# Patient Record
Sex: Female | Born: 1935 | Race: White | Hispanic: No | Marital: Married | Smoking: Former smoker
Health system: Southern US, Community
[De-identification: ages and names within clinical notes are randomized; demographics above are authoritative.]

## PROBLEM LIST (undated history)

## (undated) DIAGNOSIS — E079 Disorder of thyroid, unspecified: Secondary | ICD-10-CM

## (undated) DIAGNOSIS — E039 Hypothyroidism, unspecified: Secondary | ICD-10-CM

## (undated) DIAGNOSIS — R42 Dizziness and giddiness: Secondary | ICD-10-CM

## (undated) DIAGNOSIS — E785 Hyperlipidemia, unspecified: Secondary | ICD-10-CM

## (undated) DIAGNOSIS — I340 Nonrheumatic mitral (valve) insufficiency: Secondary | ICD-10-CM

## (undated) DIAGNOSIS — I10 Essential (primary) hypertension: Secondary | ICD-10-CM

## (undated) DIAGNOSIS — K219 Gastro-esophageal reflux disease without esophagitis: Secondary | ICD-10-CM

## (undated) DIAGNOSIS — I499 Cardiac arrhythmia, unspecified: Secondary | ICD-10-CM

## (undated) DIAGNOSIS — I4891 Unspecified atrial fibrillation: Secondary | ICD-10-CM

## (undated) DIAGNOSIS — M1990 Unspecified osteoarthritis, unspecified site: Secondary | ICD-10-CM

## (undated) HISTORY — PX: APPENDECTOMY: SHX54

## (undated) HISTORY — PX: CHOLECYSTECTOMY: SHX55

## (undated) HISTORY — PX: VAGINAL HYSTERECTOMY: SUR661

## (undated) HISTORY — PX: BREAST LUMPECTOMY: SHX2

## (undated) HISTORY — PX: BREAST BIOPSY: SHX20

## (undated) HISTORY — PX: COLONOSCOPY: SHX174

## (undated) HISTORY — PX: BREAST EXCISIONAL BIOPSY: SUR124

## (undated) HISTORY — DX: Dizziness and giddiness: R42

---

## 2000-09-03 ENCOUNTER — Encounter: Payer: Self-pay | Admitting: Family Medicine

## 2000-09-03 ENCOUNTER — Ambulatory Visit (HOSPITAL_COMMUNITY): Admission: RE | Admit: 2000-09-03 | Discharge: 2000-09-03 | Payer: Self-pay | Admitting: Family Medicine

## 2001-01-18 ENCOUNTER — Ambulatory Visit (HOSPITAL_COMMUNITY): Admission: RE | Admit: 2001-01-18 | Discharge: 2001-01-18 | Payer: Self-pay | Admitting: Internal Medicine

## 2001-09-17 ENCOUNTER — Ambulatory Visit (HOSPITAL_COMMUNITY): Admission: RE | Admit: 2001-09-17 | Discharge: 2001-09-17 | Payer: Self-pay | Admitting: Family Medicine

## 2001-09-17 ENCOUNTER — Encounter: Payer: Self-pay | Admitting: Family Medicine

## 2001-12-03 ENCOUNTER — Encounter: Payer: Self-pay | Admitting: Family Medicine

## 2001-12-03 ENCOUNTER — Ambulatory Visit (HOSPITAL_COMMUNITY): Admission: RE | Admit: 2001-12-03 | Discharge: 2001-12-03 | Payer: Self-pay | Admitting: Family Medicine

## 2002-09-30 ENCOUNTER — Encounter: Payer: Self-pay | Admitting: Family Medicine

## 2002-09-30 ENCOUNTER — Ambulatory Visit (HOSPITAL_COMMUNITY): Admission: RE | Admit: 2002-09-30 | Discharge: 2002-09-30 | Payer: Self-pay | Admitting: Family Medicine

## 2002-12-11 ENCOUNTER — Ambulatory Visit (HOSPITAL_COMMUNITY): Admission: RE | Admit: 2002-12-11 | Discharge: 2002-12-11 | Payer: Self-pay | Admitting: Family Medicine

## 2002-12-19 ENCOUNTER — Ambulatory Visit (HOSPITAL_COMMUNITY): Admission: RE | Admit: 2002-12-19 | Discharge: 2002-12-19 | Payer: Self-pay | Admitting: Family Medicine

## 2003-01-13 ENCOUNTER — Ambulatory Visit (HOSPITAL_COMMUNITY): Admission: RE | Admit: 2003-01-13 | Discharge: 2003-01-13 | Payer: Self-pay | Admitting: Internal Medicine

## 2003-10-13 ENCOUNTER — Ambulatory Visit (HOSPITAL_COMMUNITY): Admission: RE | Admit: 2003-10-13 | Discharge: 2003-10-13 | Payer: Self-pay | Admitting: Family Medicine

## 2003-10-20 ENCOUNTER — Encounter: Admission: RE | Admit: 2003-10-20 | Discharge: 2003-10-20 | Payer: Self-pay | Admitting: Family Medicine

## 2004-04-27 ENCOUNTER — Ambulatory Visit (HOSPITAL_COMMUNITY): Admission: RE | Admit: 2004-04-27 | Discharge: 2004-04-27 | Payer: Self-pay | Admitting: Family Medicine

## 2004-04-29 ENCOUNTER — Encounter: Admission: RE | Admit: 2004-04-29 | Discharge: 2004-04-29 | Payer: Self-pay | Admitting: Family Medicine

## 2004-04-29 ENCOUNTER — Encounter (INDEPENDENT_AMBULATORY_CARE_PROVIDER_SITE_OTHER): Payer: Self-pay | Admitting: *Deleted

## 2004-10-17 ENCOUNTER — Ambulatory Visit (HOSPITAL_COMMUNITY): Admission: RE | Admit: 2004-10-17 | Discharge: 2004-10-17 | Payer: Self-pay | Admitting: Family Medicine

## 2005-10-23 ENCOUNTER — Ambulatory Visit (HOSPITAL_COMMUNITY): Admission: RE | Admit: 2005-10-23 | Discharge: 2005-10-23 | Payer: Self-pay | Admitting: Family Medicine

## 2006-01-08 ENCOUNTER — Ambulatory Visit (HOSPITAL_COMMUNITY): Admission: RE | Admit: 2006-01-08 | Discharge: 2006-01-08 | Payer: Self-pay | Admitting: Family Medicine

## 2006-01-29 ENCOUNTER — Ambulatory Visit (HOSPITAL_COMMUNITY): Admission: RE | Admit: 2006-01-29 | Discharge: 2006-01-29 | Payer: Self-pay | Admitting: Endocrinology

## 2006-10-10 ENCOUNTER — Ambulatory Visit (HOSPITAL_COMMUNITY): Admission: RE | Admit: 2006-10-10 | Discharge: 2006-10-10 | Payer: Self-pay | Admitting: Family Medicine

## 2006-10-29 ENCOUNTER — Ambulatory Visit (HOSPITAL_COMMUNITY): Admission: RE | Admit: 2006-10-29 | Discharge: 2006-10-29 | Payer: Self-pay | Admitting: Family Medicine

## 2006-12-12 ENCOUNTER — Ambulatory Visit (HOSPITAL_COMMUNITY): Admission: RE | Admit: 2006-12-12 | Discharge: 2006-12-12 | Payer: Self-pay | Admitting: Endocrinology

## 2007-07-10 ENCOUNTER — Ambulatory Visit (HOSPITAL_COMMUNITY): Admission: RE | Admit: 2007-07-10 | Discharge: 2007-07-10 | Payer: Self-pay | Admitting: Endocrinology

## 2007-11-04 ENCOUNTER — Ambulatory Visit (HOSPITAL_COMMUNITY): Admission: RE | Admit: 2007-11-04 | Discharge: 2007-11-04 | Payer: Self-pay | Admitting: Family Medicine

## 2008-05-20 ENCOUNTER — Ambulatory Visit (HOSPITAL_COMMUNITY): Admission: RE | Admit: 2008-05-20 | Discharge: 2008-05-20 | Payer: Self-pay | Admitting: Endocrinology

## 2008-11-09 ENCOUNTER — Ambulatory Visit (HOSPITAL_COMMUNITY): Admission: RE | Admit: 2008-11-09 | Discharge: 2008-11-09 | Payer: Self-pay | Admitting: Family Medicine

## 2009-05-26 ENCOUNTER — Encounter: Admission: RE | Admit: 2009-05-26 | Discharge: 2009-05-26 | Payer: Self-pay | Admitting: Endocrinology

## 2009-11-15 ENCOUNTER — Ambulatory Visit (HOSPITAL_COMMUNITY): Admission: RE | Admit: 2009-11-15 | Discharge: 2009-11-15 | Payer: Self-pay | Admitting: Family Medicine

## 2009-12-07 ENCOUNTER — Ambulatory Visit (HOSPITAL_COMMUNITY): Admission: RE | Admit: 2009-12-07 | Discharge: 2009-12-07 | Payer: Self-pay | Admitting: Endocrinology

## 2010-10-17 ENCOUNTER — Other Ambulatory Visit (HOSPITAL_COMMUNITY): Payer: Self-pay | Admitting: Family Medicine

## 2010-10-17 DIAGNOSIS — Z139 Encounter for screening, unspecified: Secondary | ICD-10-CM

## 2010-10-21 ENCOUNTER — Encounter: Payer: Self-pay | Admitting: *Deleted

## 2010-10-21 ENCOUNTER — Emergency Department (HOSPITAL_COMMUNITY)
Admission: EM | Admit: 2010-10-21 | Discharge: 2010-10-22 | Disposition: A | Discharge status: Home / Self Care | Payer: PRIVATE HEALTH INSURANCE | Attending: Emergency Medicine | Admitting: Emergency Medicine

## 2010-10-21 DIAGNOSIS — M25519 Pain in unspecified shoulder: Secondary | ICD-10-CM | POA: Insufficient documentation

## 2010-10-21 DIAGNOSIS — E079 Disorder of thyroid, unspecified: Secondary | ICD-10-CM | POA: Insufficient documentation

## 2010-10-21 DIAGNOSIS — G43909 Migraine, unspecified, not intractable, without status migrainosus: Secondary | ICD-10-CM | POA: Insufficient documentation

## 2010-10-21 DIAGNOSIS — R1013 Epigastric pain: Secondary | ICD-10-CM | POA: Insufficient documentation

## 2010-10-21 DIAGNOSIS — I10 Essential (primary) hypertension: Secondary | ICD-10-CM | POA: Insufficient documentation

## 2010-10-21 DIAGNOSIS — R112 Nausea with vomiting, unspecified: Secondary | ICD-10-CM | POA: Insufficient documentation

## 2010-10-21 HISTORY — DX: Essential (primary) hypertension: I10

## 2010-10-21 HISTORY — DX: Disorder of thyroid, unspecified: E07.9

## 2010-10-21 NOTE — ED Notes (Addendum)
Pt c/o mid back pain, epigastric pain and vomiting. Pt states after she vomited she felt like she pulled a muscle in her back and is now sob at times.

## 2010-10-22 ENCOUNTER — Other Ambulatory Visit: Payer: Self-pay

## 2010-10-22 LAB — CBC
HCT: 41 % (ref 36.0–46.0)
Hemoglobin: 13.7 g/dL (ref 12.0–15.0)
MCH: 28.5 pg (ref 26.0–34.0)
MCHC: 33.4 g/dL (ref 30.0–36.0)
MCV: 85.4 fL (ref 78.0–100.0)
Platelets: 283 10*3/uL (ref 150–400)
RBC: 4.8 MIL/uL (ref 3.87–5.11)
RDW: 14.1 % (ref 11.5–15.5)
WBC: 9.4 10*3/uL (ref 4.0–10.5)

## 2010-10-22 LAB — COMPREHENSIVE METABOLIC PANEL
Albumin: 3.6 g/dL (ref 3.5–5.2)
BUN: 18 mg/dL (ref 6–23)
Calcium: 8.8 mg/dL (ref 8.4–10.5)
Creatinine, Ser: 0.63 mg/dL (ref 0.50–1.10)
Potassium: 3.5 mEq/L (ref 3.5–5.1)
Sodium: 136 mEq/L (ref 135–145)
Total Protein: 6.5 g/dL (ref 6.0–8.3)

## 2010-10-22 LAB — LIPASE, BLOOD: Lipase: 18 U/L (ref 11–59)

## 2010-10-22 LAB — TROPONIN I: Troponin I: <0.3 ng/mL (ref <0.30)

## 2010-10-22 MED ORDER — ONDANSETRON HCL 4 MG PO TABS: 8.0000 mg | ORAL_TABLET | Freq: Three times a day (TID) | ORAL | Status: AC | PRN

## 2010-10-22 MED ORDER — SODIUM CHLORIDE 0.9 % IV BOLUS (SEPSIS)
1000.0000 mL | Freq: Once | INTRAVENOUS | Status: AC
  Administered 2010-10-22: 1000 mL via INTRAVENOUS

## 2010-10-22 MED ORDER — GI COCKTAIL ~~LOC~~
30.0000 mL | Freq: Once | ORAL | Status: AC
  Administered 2010-10-22: 30 mL via ORAL
  Filled 2010-10-22: qty 30

## 2010-10-22 MED ORDER — ONDANSETRON HCL 4 MG/2ML IJ SOLN
4.0000 mg | Freq: Once | INTRAMUSCULAR | Status: AC
  Administered 2010-10-22: 4 mg via INTRAVENOUS
  Filled 2010-10-22: qty 2

## 2010-10-22 NOTE — ED Provider Notes (Signed)
History     CSN: [id number] Arrival date & time: 10/21/2010 11:56 PM  Chief Complaint  Patient presents with  . Abdominal Pain  . Emesis  . Back Pain  . Shortness of Breath    (Consider location/radiation/quality/duration/timing/severity/associated sxs/prior treatment) The history is provided by the patient.   patient reports developing nausea and several episodes of nonbloody nonbilious vomiting today and this evening.  She reports this evening before going to bed she did begin to have some pain in her left scapular region as well as in the epigastric discomfort.  She felt as though that she was unable to get a deep breath.  At that time she was still nauseated now she presents to the emergency department for evaluation.  He is a healthy female without significant past medical history.  She denies history of congestive heart failure.  She denies recent sick contacts.  She's had no diaphoresis.  She's had no cough congestion fever or chills.  She's had no melena or hematochezia.  Nothing worsens her symptoms.  Her symptoms are not improved by anything either.  Discomfort in her left scapula is described as a ache.   Past Medical History  Diagnosis Date  . Hypertension   . Thyroid disease   . Migraine     Past Surgical History  Procedure Date  . Vaginal hysterectomy   . Cholecystectomy     History reviewed. No pertinent family history.  History  Substance Use Topics  . Smoking status: Never Smoker   . Smokeless tobacco: Not on file  . Alcohol Use: No    OB History    Grav Para Term Preterm Abortions TAB SAB Ect Mult Living                  Review of Systems  All other systems reviewed and are negative.    Allergies  Review of patient's allergies indicates no known allergies.  Home Medications   Current Outpatient Rx  Name Route Sig Dispense Refill  . BENAZEPRIL-HYDROCHLOROTHIAZIDE 20-12.5 MG PO TABS Oral Take 1 tablet by mouth daily.      Marland Kitchen CALCIUM CARBONATE  600 MG PO TABS Oral Take 600 mg by mouth 2 (two) times daily with a meal.      . EZETIMIBE-SIMVASTATIN 10-20 MG PO TABS Oral Take 0.5 tablets by mouth at bedtime.      Marland Kitchen FEXOFENADINE HCL 180 MG PO TABS Oral Take 180 mg by mouth daily.      . IMIPRAMINE HCL 25 MG PO TABS Oral Take 25 mg by mouth at bedtime.      Marland Kitchen LEVOTHYROXINE SODIUM 100 MCG PO TABS Oral Take 100 mcg by mouth daily.        BP 139/99  Pulse 72  Temp(Src) 98.2 F (36.8 C) (Oral)  Resp 20  Wt 155 lb (70.308 kg)  SpO2 99%  Physical Exam  Nursing note and vitals reviewed. Constitutional: She is oriented to person, place, and time. She appears well-developed and well-nourished. No distress.  HENT:  Head: Normocephalic and atraumatic.  Eyes: EOM are normal.  Neck: Normal range of motion.  Cardiovascular: Normal rate, regular rhythm and normal heart sounds.   Pulmonary/Chest: Effort normal and breath sounds normal.  Abdominal: Soft. She exhibits no distension. There is no tenderness.  Musculoskeletal: Normal range of motion. She exhibits no tenderness.       Mild left scapular pain.  No evidence of rash  Neurological: She is alert and oriented to person, place,  and time.  Skin: Skin is warm and dry.  Psychiatric: She has a normal mood and affect. Judgment normal.    ED Course  Procedures (including critical care time)  Labs Reviewed  COMPREHENSIVE METABOLIC PANEL - Abnormal; Notable for the following:    Glucose, Bld 102 (*)    GFR calc non Af Amer 85 (*)    All other components within normal limits  CBC  LIPASE, BLOOD  TROPONIN I   No results found.   1. Nausea and vomiting      Date: 10/22/2010  Rate: 69  Rhythm: normal sinus rhythm  QRS Axis: normal  Intervals: normal  ST/T Wave abnormalities: normal  Conduction Disutrbances:none  Narrative Interpretation:   Old EKG Reviewed: none available    MDM  Patient symptoms seem GI related in nature.  Her nausea was controlled.  She was given IV  fluids.  She was given a GI cocktail with resolution of her symptoms.  Her EKG is normal. My suspicion that this is an atypical presentation for ACS is very low.  Patient will be sent home with a prescription for antiemetics as well as Prilosec.  She's been instructed to followup with her primary care Dr. on Monday if she continues to not feel well.  She's been instructed to return to the ER sooner for any new or worsening symptoms        Lyanne Co, MD 10/22/10 [phone]

## 2010-10-22 NOTE — ED Notes (Signed)
GI Cocktail administered, pt advised not to eat or drink for a minimum of 30 minutes.

## 2010-11-21 ENCOUNTER — Ambulatory Visit (HOSPITAL_COMMUNITY)
Admission: RE | Admit: 2010-11-21 | Discharge: 2010-11-21 | Disposition: A | Discharge status: Home / Self Care | Payer: Medicare Other | Source: Ambulatory Visit | Attending: Family Medicine | Admitting: Family Medicine

## 2010-11-21 DIAGNOSIS — Z1231 Encounter for screening mammogram for malignant neoplasm of breast: Secondary | ICD-10-CM | POA: Insufficient documentation

## 2010-11-21 DIAGNOSIS — Z139 Encounter for screening, unspecified: Secondary | ICD-10-CM

## 2010-12-02 ENCOUNTER — Other Ambulatory Visit: Payer: Self-pay | Admitting: Family Medicine

## 2010-12-02 DIAGNOSIS — R928 Other abnormal and inconclusive findings on diagnostic imaging of breast: Secondary | ICD-10-CM

## 2010-12-07 ENCOUNTER — Other Ambulatory Visit (HOSPITAL_COMMUNITY): Payer: Self-pay | Admitting: Family Medicine

## 2010-12-07 ENCOUNTER — Ambulatory Visit (HOSPITAL_COMMUNITY)
Admission: RE | Admit: 2010-12-07 | Discharge: 2010-12-07 | Disposition: A | Discharge status: Home / Self Care | Payer: Medicare Other | Source: Ambulatory Visit | Attending: Family Medicine | Admitting: Family Medicine

## 2010-12-07 ENCOUNTER — Other Ambulatory Visit: Payer: Self-pay | Admitting: Diagnostic Radiology

## 2010-12-07 ENCOUNTER — Inpatient Hospital Stay (HOSPITAL_COMMUNITY): Admission: RE | Admit: 2010-12-07 | Discharge: 2010-12-07 | Payer: Medicare Other | Source: Ambulatory Visit

## 2010-12-07 DIAGNOSIS — R928 Other abnormal and inconclusive findings on diagnostic imaging of breast: Secondary | ICD-10-CM

## 2010-12-07 DIAGNOSIS — N63 Unspecified lump in unspecified breast: Secondary | ICD-10-CM

## 2010-12-07 DIAGNOSIS — N6089 Other benign mammary dysplasias of unspecified breast: Secondary | ICD-10-CM | POA: Insufficient documentation

## 2010-12-07 NOTE — Progress Notes (Signed)
Procedure started at 1035.  Left breast biopsy performed.  8 cc of Lidocaine injected into left breast.  4 passes with 4 tissue biopsies removed with clip placement.  Procedure end time 1043.

## 2010-12-26 ENCOUNTER — Encounter (INDEPENDENT_AMBULATORY_CARE_PROVIDER_SITE_OTHER): Payer: Self-pay | Admitting: General Surgery

## 2010-12-26 ENCOUNTER — Other Ambulatory Visit (INDEPENDENT_AMBULATORY_CARE_PROVIDER_SITE_OTHER): Payer: Self-pay | Admitting: General Surgery

## 2010-12-26 ENCOUNTER — Ambulatory Visit (INDEPENDENT_AMBULATORY_CARE_PROVIDER_SITE_OTHER): Payer: Medicare Other | Admitting: General Surgery

## 2010-12-26 VITALS — BP 130/64 | HR 82 | Temp 97.8°F | Resp 18 | Ht 67.0 in | Wt 158.4 lb

## 2010-12-26 DIAGNOSIS — N6099 Unspecified benign mammary dysplasia of unspecified breast: Secondary | ICD-10-CM

## 2010-12-26 DIAGNOSIS — N6089 Other benign mammary dysplasias of unspecified breast: Secondary | ICD-10-CM

## 2010-12-26 NOTE — Progress Notes (Signed)
Patient ID: Erin Schmidt, female   DOB: 09-11-1932, 77 y.o.   MRN: [id number]  Chief Complaint  Patient presents with  . Other    new pt- eval lt breast atypical hyperplasia    HPI Erin Schmidt is a 77 y.o. female. She is referred by Dr. Cain Saupe for evaluation of atypical ductal hyperplasia left breast, 3:00 position  The patient has not had any serious breast problems in the past. She had a left breast biopsy 15 years ago, and a right breast biopsy 30 years ago, and was told both of those were completely benign.  Recent screening mammogram show a 7 mm mass at the 3:00 position of the left breast laterally, 6 cm the nipple. Image guided biopsy revealed a papillary lesion with atypical ductal hyperplasia. Dr. Jimmy Picket said that this border no ductal carcinoma in situ.  She has done well since the biopsy and is here to discuss excision of this area.  Family history is negative.  She denies feeling a mass. She denies nipple discharge. She is asymptomatic. HPI  Past Medical History  Diagnosis Date  . Thyroid disease   . Migraine   . Hypertension     preventative measures    Past Surgical History  Procedure Date  . Cholecystectomy   . Breast lumpectomy 30 yrs ago    Dr. Shon Hale- rt breast  . Breast lumpectomy 15 yrs ago    left- Dr. Malvin Johns  . Vaginal hysterectomy     2 partials    Family History  Problem Relation Age of Onset  . Heart disease Mother     Social History History  Substance Use Topics  . Smoking status: Former Games developer  . Smokeless tobacco: Not on file  . Alcohol Use: No    No Known Allergies  Current Outpatient Prescriptions  Medication Sig Dispense Refill  . benazepril-hydrochlorthiazide (LOTENSIN HCT) 20-12.5 MG per tablet Take 1 tablet by mouth daily.        . Calcium Carbonate-Vitamin D (CALCIUM + D PO) Take 600 mg by mouth 2 (two) times daily.        Marland Kitchen ezetimibe-simvastatin (VYTORIN) 10-20 MG per tablet Take 0.5 tablets by  mouth at bedtime.        . fexofenadine (ALLEGRA) 180 MG tablet Take 180 mg by mouth daily.        Marland Kitchen imipramine (TOFRANIL) 25 MG tablet Take 50 mg by mouth at bedtime.       Marland Kitchen levothyroxine (SYNTHROID, LEVOTHROID) 100 MCG tablet Take 100 mcg by mouth daily.        Marland Kitchen omeprazole (PRILOSEC) 20 MG capsule Take 20 mg by mouth daily.          Review of Systems Review of Systems  Constitutional: Negative for fever, chills and unexpected weight change.  HENT: Negative for hearing loss, congestion, sore throat, trouble swallowing and voice change.   Eyes: Negative for visual disturbance.  Respiratory: Negative for cough and wheezing.   Cardiovascular: Negative for chest pain, palpitations and leg swelling.  Gastrointestinal: Negative for nausea, vomiting, abdominal pain, diarrhea, constipation, blood in stool, abdominal distention and anal bleeding.  Genitourinary: Negative for hematuria, vaginal bleeding and difficulty urinating.  Musculoskeletal: Negative for arthralgias.  Skin: Negative for rash and wound.  Neurological: Negative for seizures, syncope and headaches.  Hematological: Negative for adenopathy. Does not bruise/bleed easily.  Psychiatric/Behavioral: Negative for confusion.    Blood pressure 130/64, pulse 82, temperature 97.8 F (36.6 C), temperature source Temporal,  resp. rate 18, height 5\' 7"  (1.702 m), weight 158 lb 6.4 oz (71.85 kg).  Physical Exam Physical Exam  Constitutional: She is oriented to person, place, and time. She appears well-developed and well-nourished. No distress.  HENT:  Head: Normocephalic and atraumatic.  Nose: Nose normal.  Mouth/Throat: No oropharyngeal exudate.  Eyes: Conjunctivae and EOM are normal. Pupils are equal, round, and reactive to light. Left eye exhibits no discharge. No scleral icterus.  Neck: Neck supple. No JVD present. No tracheal deviation present. No thyromegaly present.  Cardiovascular: Normal rate, regular rhythm, normal heart  sounds and intact distal pulses.   No murmur heard. Pulmonary/Chest: Effort normal and breath sounds normal. No respiratory distress. She has no wheezes. She has no rales. She exhibits no tenderness.    Abdominal: Soft. Bowel sounds are normal. She exhibits no distension and no mass. There is no tenderness. There is no rebound and no guarding.    Musculoskeletal: She exhibits no edema and no tenderness.  Lymphadenopathy:    She has no cervical adenopathy.  Neurological: She is alert and oriented to person, place, and time. She exhibits normal muscle tone. Coordination normal.  Skin: Skin is warm. No rash noted. She is not diaphoretic. No erythema. No pallor.  Psychiatric: She has a normal mood and affect. Her behavior is normal. Judgment and thought content normal.    Data Reviewed  I reviewed her mammograms, her ultrasound, and her pathology report. I have given her a copy of her pathology report. Assessment    Left breast mass, 3:00 position. By image guided biopsy this is at least atypical ductal hyperplasia, and this borders on ductal carcinoma in situ.  Hypertension, controlled  Hypothyroidism.  History migraine headaches.  Hyperlipidemia    Plan    The patient will be scheduled for left partial mastectomy with needle localization in the near future.  I discussed the indications and details of the surgery with her and her husband. Risks and complications have been outlined, including but not limited to bleeding, infection, cosmetic deformity, skin necrosis, nerve damage, reoperation for positive margins, reoperations for lymph node biopsy if this turns out to be invasive cancer, cardiac, pulmonary, and thromboembolic problems. She seems to understand these issues well. Her questions were answered. She agrees with this plan.       Polo Mcmartin M 12/26/2010, 10:34 AM

## 2010-12-26 NOTE — Patient Instructions (Signed)
In the lateral aspect of the left breast there is a small spot. The biopsy shows atypical ductal hyperplasia. It is possible that this may be an early form of cancer called ductal carcinoma in situ. You will be scheduled for a left breast biopsy with needle localization in the near future.  Lumpectomy, Breast Conserving Surgery A lumpectomy is breast surgery that removes only part of the breast. Another name used may be partial mastectomy. The amount removed varies. Make sure you understand how much of your breast will be removed. Reasons for a lumpectomy:  Any solid breast mass.   Grouped significant nodularity that may be confused with a solitary breast mass.  Lumpectomy is the most common form of breast cancer surgery today. The surgeon removes the portion of your breast which contains the tumor (cancer). This is the lump. Some normal tissue around the lump is also removed to be sure that all the tumor has been removed.  If cancer cells are found in the margins where the breast tissue was removed, your surgeon will do more surgery to remove the remaining cancer tissue. This is called re-excision surgery. Radiation and/or chemotherapy treatments are often given following a lumpectomy to kill any cancer cells that could possibly remain.  REASONS YOU MAY NOT BE ABLE TO HAVE BREAST CONSERVING SURGERY:  The tumor is located in more than one place.   Your breast is small and the tumor is large so the breast would be disfigured.   The entire tumor removal is not successful with a lumpectomy.   You cannot commit to a full course of chemotherapy, radiation therapy or are pregnant and cannot have radiation.   You have previously had radiation to the breast to treat cancer.  HOW A LUMPECTOMY IS PERFORMED If overnight nursing is not required following a biopsy, a lumpectomy can be performed as a same-day surgery. This can be done in a hospital, clinic, or surgical center. The anesthesia used will  depend on your surgeon. They will discuss this with you. A general anesthetic keeps you sleeping through the procedure. LET YOUR CAREGIVERS KNOW ABOUT THE FOLLOWING:  Allergies   Medications taken including herbs, eye drops, over the counter medications, and creams.   Use of steroids (by mouth or creams)   Previous problems with anesthetics or Novocaine.   Possibility of pregnancy, if this applies   History of blood clots (thrombophlebitis)   History of bleeding or blood problems.   Previous surgery   Other health problems  BEFORE THE PROCEDURE You should be present one hour prior to your procedure unless directed otherwise.  AFTER THE PROCEDURE  After surgery, you will be taken to the recovery area where a nurse will watch and check your progress. Once you're awake, stable, and taking fluids well, barring other problems you will be allowed to go home.   Ice packs applied to your operative site may help with discomfort and keep the swelling down.   A small rubber drain may be placed in the breast for a couple of days to prevent a hematoma from developing in the breast.   A pressure dressing may be applied for 24 to 48 hours to prevent bleeding.   Keep the wound dry.   You may resume a normal diet and activities as directed. Avoid strenuous activities affecting the arm on the side of the biopsy site such as tennis, swimming, heavy lifting (more than 10 pounds) or pulling.   Bruising in the breast is normal following  this procedure.   Wearing a bra - even to bed - may be more comfortable and also help keep the dressing on.   Change dressings as directed.   Only take over-the-counter or prescription medicines for pain, discomfort, or fever as directed by your caregiver.  Call for your results as instructed by your surgeon. Remember it is your responsibility to get the results of your lumpectomy if your surgeon asked you to follow-up. Do not assume everything is fine if you  have not heard from your caregiver. SEEK MEDICAL CARE IF:   There is increased bleeding (more than a small spot) from the wound.   You notice redness, swelling, or increasing pain in the wound.   Pus is coming from wound.   An unexplained oral temperature above 102 F (38.9 C) develops.   You notice a foul smell coming from the wound or dressing.  SEEK IMMEDIATE MEDICAL CARE IF:   You develop a rash.   You have difficulty breathing.   You have any allergic problems.  Document Released: 02/06/2006 Document Revised: 09/07/2010 Document Reviewed: 05/10/2006 [facility] Patient Information 2012 [place], [place].

## 2011-01-06 ENCOUNTER — Encounter (HOSPITAL_BASED_OUTPATIENT_CLINIC_OR_DEPARTMENT_OTHER): Payer: Self-pay | Admitting: *Deleted

## 2011-01-06 NOTE — Progress Notes (Signed)
To come in for lab

## 2011-01-10 HISTORY — PX: BREAST LUMPECTOMY: SHX2

## 2011-01-11 ENCOUNTER — Telehealth (INDEPENDENT_AMBULATORY_CARE_PROVIDER_SITE_OTHER): Payer: Self-pay

## 2011-01-11 NOTE — Telephone Encounter (Signed)
Pt has pulled muscle in her back and wants to know if she can take a muscle relaxer. I advised pt that she needs to discuss any meds for this with her PCP and if he prescribes something it should not be an anti inflammatory or aspirin in it due to blood thinner prior to surgery on Friday. Pt will call her MD re: this and also let presurg testing know what med she is place on.

## 2011-01-12 ENCOUNTER — Encounter (HOSPITAL_BASED_OUTPATIENT_CLINIC_OR_DEPARTMENT_OTHER)
Admission: RE | Admit: 2011-01-12 | Discharge: 2011-01-12 | Disposition: A | Discharge status: Home / Self Care | Payer: Medicare Other | Source: Ambulatory Visit | Attending: General Surgery | Admitting: General Surgery

## 2011-01-12 LAB — BASIC METABOLIC PANEL
CO2: 30 mEq/L (ref 19–32)
Calcium: 9.4 mg/dL (ref 8.4–10.5)
Chloride: 101 mEq/L (ref 96–112)
Creatinine, Ser: 0.83 mg/dL (ref 0.50–1.10)
GFR calc Af Amer: 77 mL/min — ABNORMAL LOW (ref >90)
Sodium: 138 mEq/L (ref 135–145)

## 2011-01-12 NOTE — H&P (Signed)
Erin Schmidt    MRN: [id number]   Description: 78 year old female  Provider: Ernestene Mention, MD  Department: [facility]        Diagnoses     Breast atypical ductal hyperplasia   - Primary    610.8        Vitals - Last Recorded       BP Pulse Temp(Src) Resp Ht Wt    130/64  82  97.8 F (36.6 C) (Temporal)  18  5\' 7"  (1.702 m)  158 lb 6.4 oz (71.85 kg)          BMI     24.81 kg/m2                 H & P     Ernestene Mention, MD   Signed Patient ID: Erin Schmidt, female   DOB: [birth year], 78 y.o.   MRN: [id number]    Chief Complaint   Patient presents with   .  Other       new pt- eval lt breast atypical hyperplasia      HPI Erin Schmidt is a 78 y.o. female. She is referred by Dr. Cain Saupe for evaluation of atypical ductal hyperplasia left breast, 3:00 position   The patient has not had any serious breast problems in the past. She had a left breast biopsy 15 years ago, and a right breast biopsy 30 years ago, and was told both of those were completely benign.   Recent screening mammogram show a 7 mm mass at the 3:00 position of the left breast laterally, 6 cm the nipple. Image guided biopsy revealed a papillary lesion with atypical ductal hyperplasia. Dr. Jimmy Picket said that this border no ductal carcinoma in situ.   She has done well since the biopsy and is here to discuss excision of this area.   Family history is negative.   She denies feeling a mass. She denies nipple discharge. She is asymptomatic. HPI    Past Medical History   Diagnosis  Date   .  Thyroid disease     .  Migraine     .  Hypertension         preventative measures       Past Surgical History   Procedure  Date   .  Cholecystectomy     .  Breast lumpectomy  30 yrs ago       Dr. Shon Hale- rt breast   .  Breast lumpectomy  15 yrs ago       left- Dr. Malvin Johns   .  Vaginal hysterectomy         2 partials       Family History   Problem  Relation  Age  of Onset   .  Heart disease  Mother        Social History History   Substance Use Topics   .  Smoking status:  Former Games developer   .  Smokeless tobacco:  Not on file   .  Alcohol Use:  No      No Known Allergies    Current Outpatient Prescriptions   Medication  Sig  Dispense  Refill   .  benazepril-hydrochlorthiazide (LOTENSIN HCT) 20-12.5 MG per tablet  Take 1 tablet by mouth daily.           .  Calcium Carbonate-Vitamin D (CALCIUM + D PO)  Take 600 mg by mouth 2 (two) times daily.           Marland Kitchen  ezetimibe-simvastatin (VYTORIN) 10-20 MG per tablet  Take 0.5 tablets by mouth at bedtime.           .  fexofenadine (ALLEGRA) 180 MG tablet  Take 180 mg by mouth daily.           Marland Kitchen  imipramine (TOFRANIL) 25 MG tablet  Take 50 mg by mouth at bedtime.          Marland Kitchen  levothyroxine (SYNTHROID, LEVOTHROID) 100 MCG tablet  Take 100 mcg by mouth daily.           Marland Kitchen  omeprazole (PRILOSEC) 20 MG capsule  Take 20 mg by mouth daily.              Review of Systems   Constitutional: Negative for fever, chills and unexpected weight change.  HENT: Negative for hearing loss, congestion, sore throat, trouble swallowing and voice change.   Eyes: Negative for visual disturbance.  Respiratory: Negative for cough and wheezing.   Cardiovascular: Negative for chest pain, palpitations and leg swelling.  Gastrointestinal: Negative for nausea, vomiting, abdominal pain, diarrhea, constipation, blood in stool, abdominal distention and anal bleeding.  Genitourinary: Negative for hematuria, vaginal bleeding and difficulty urinating.  Musculoskeletal: Negative for arthralgias.  Skin: Negative for rash and wound.  Neurological: Negative for seizures, syncope and headaches.  Hematological: Negative for adenopathy. Does not bruise/bleed easily.  Psychiatric/Behavioral: Negative for confusion.    Blood pressure 130/64, pulse 82, temperature 97.8 F (36.6 C), temperature source Temporal, resp. rate 18, height 5\' 7"  (1.702  m), weight 158 lb 6.4 oz (71.85 kg).   Physical Exam  Constitutional: She is oriented to person, place, and time. She appears well-developed and well-nourished. No distress.  HENT:   Head: Normocephalic and atraumatic.   Nose: Nose normal.   Mouth/Throat: No oropharyngeal exudate.  Eyes: Conjunctivae and EOM are normal. Pupils are equal, round, and reactive to light. Left eye exhibits no discharge. No scleral icterus.  Neck: Neck supple. No JVD present. No tracheal deviation present. No thyromegaly present.  Cardiovascular: Normal rate, regular rhythm, normal heart sounds and intact distal pulses.    No murmur heard. Pulmonary/Chest: Effort normal and breath sounds normal. No respiratory distress. She has no wheezes. She has no rales. She exhibits no tenderness.    Abdominal: Soft. Bowel sounds are normal. She exhibits no distension and no mass. There is no tenderness. There is no rebound and no guarding.    Musculoskeletal: She exhibits no edema and no tenderness.  Lymphadenopathy:    She has no cervical adenopathy.  Neurological: She is alert and oriented to person, place, and time. She exhibits normal muscle tone. Coordination normal.  Skin: Skin is warm. No rash noted. She is not diaphoretic. No erythema. No pallor.  Psychiatric: She has a normal mood and affect. Her behavior is normal. Judgment and thought content normal.    Data Reviewed I reviewed her mammograms, her ultrasound, and her pathology report. I have given her a copy of her pathology report.    Assessment Left breast mass, 3:00 position. By image guided biopsy this is at least atypical ductal hyperplasia, and this borders on ductal carcinoma in situ.   Hypertension, controlled   Hypothyroidism.   History migraine headaches.   Hyperlipidemia   Plan The patient will be scheduled for left partial mastectomy with needle localization in the near future.   I discussed the indications and details of the  surgery with her and her husband. Risks and complications have been outlined,  including but not limited to bleeding, infection, cosmetic deformity, skin necrosis, nerve damage, reoperation for positive margins, reoperations for lymph node biopsy if this turns out to be invasive cancer, cardiac, pulmonary, and thromboembolic problems. She seems to understand these issues well. Her questions were answered. She agrees with this plan.     Ernestene Mention 01/12/2011 6:48 AM

## 2011-01-13 ENCOUNTER — Ambulatory Visit
Admission: RE | Admit: 2011-01-13 | Discharge: 2011-01-13 | Disposition: A | Discharge status: Home / Self Care | Payer: Medicare Other | Source: Ambulatory Visit | Attending: General Surgery | Admitting: General Surgery

## 2011-01-13 ENCOUNTER — Encounter (HOSPITAL_BASED_OUTPATIENT_CLINIC_OR_DEPARTMENT_OTHER): Payer: Self-pay | Admitting: Anesthesiology

## 2011-01-13 ENCOUNTER — Other Ambulatory Visit (INDEPENDENT_AMBULATORY_CARE_PROVIDER_SITE_OTHER): Payer: Self-pay | Admitting: General Surgery

## 2011-01-13 ENCOUNTER — Ambulatory Visit (HOSPITAL_BASED_OUTPATIENT_CLINIC_OR_DEPARTMENT_OTHER): Payer: Medicare Other | Admitting: Anesthesiology

## 2011-01-13 ENCOUNTER — Ambulatory Visit (HOSPITAL_BASED_OUTPATIENT_CLINIC_OR_DEPARTMENT_OTHER)
Admission: RE | Admit: 2011-01-13 | Discharge: 2011-01-13 | Disposition: A | Discharge status: Home / Self Care | Payer: Medicare Other | Source: Ambulatory Visit | Attending: General Surgery | Admitting: General Surgery

## 2011-01-13 ENCOUNTER — Encounter (HOSPITAL_BASED_OUTPATIENT_CLINIC_OR_DEPARTMENT_OTHER): Payer: Self-pay | Admitting: *Deleted

## 2011-01-13 ENCOUNTER — Encounter (HOSPITAL_BASED_OUTPATIENT_CLINIC_OR_DEPARTMENT_OTHER)
Admission: RE | Disposition: A | Discharge status: Home / Self Care | Payer: Self-pay | Source: Ambulatory Visit | Attending: General Surgery

## 2011-01-13 DIAGNOSIS — N6099 Unspecified benign mammary dysplasia of unspecified breast: Secondary | ICD-10-CM

## 2011-01-13 DIAGNOSIS — Z01812 Encounter for preprocedural laboratory examination: Secondary | ICD-10-CM | POA: Insufficient documentation

## 2011-01-13 DIAGNOSIS — G8918 Other acute postprocedural pain: Secondary | ICD-10-CM

## 2011-01-13 DIAGNOSIS — D249 Benign neoplasm of unspecified breast: Secondary | ICD-10-CM | POA: Insufficient documentation

## 2011-01-13 DIAGNOSIS — N6089 Other benign mammary dysplasias of unspecified breast: Secondary | ICD-10-CM

## 2011-01-13 HISTORY — DX: Unspecified osteoarthritis, unspecified site: M19.90

## 2011-01-13 HISTORY — PX: MASTECTOMY, PARTIAL: SHX709

## 2011-01-13 HISTORY — DX: Gastro-esophageal reflux disease without esophagitis: K21.9

## 2011-01-13 HISTORY — DX: Hyperlipidemia, unspecified: E78.5

## 2011-01-13 LAB — POCT HEMOGLOBIN-HEMACUE: Hemoglobin: 13.3 g/dL (ref 12.0–15.0)

## 2011-01-13 SURGERY — MASTECTOMY PARTIAL
Anesthesia: General | Site: Breast | Laterality: Left | Wound class: Clean

## 2011-01-13 MED ORDER — SODIUM CHLORIDE 0.9 % IJ SOLN: 3.0000 mL | INTRAMUSCULAR | Status: DC | PRN

## 2011-01-13 MED ORDER — METOCLOPRAMIDE HCL 5 MG/ML IJ SOLN
INTRAMUSCULAR | Status: DC | PRN
  Administered 2011-01-13: 10 mg via INTRAVENOUS

## 2011-01-13 MED ORDER — SODIUM CHLORIDE 0.9 % IV SOLN: 250.0000 mL | INTRAVENOUS | Status: DC | PRN

## 2011-01-13 MED ORDER — ONDANSETRON HCL 4 MG/2ML IJ SOLN
INTRAMUSCULAR | Status: DC | PRN
  Administered 2011-01-13: 4 mg via INTRAVENOUS

## 2011-01-13 MED ORDER — SODIUM CHLORIDE 0.9 % IJ SOLN: 3.0000 mL | Freq: Two times a day (BID) | INTRAMUSCULAR | Status: DC

## 2011-01-13 MED ORDER — MIDAZOLAM HCL 5 MG/5ML IJ SOLN
INTRAMUSCULAR | Status: DC | PRN
  Administered 2011-01-13: 2 mg via INTRAVENOUS

## 2011-01-13 MED ORDER — ACETAMINOPHEN 10 MG/ML IV SOLN
1000.0000 mg | Freq: Once | INTRAVENOUS | Status: AC
  Administered 2011-01-13: 1000 mg via INTRAVENOUS

## 2011-01-13 MED ORDER — METOCLOPRAMIDE HCL 5 MG/ML IJ SOLN: 10.0000 mg | Freq: Once | INTRAMUSCULAR | Status: DC | PRN

## 2011-01-13 MED ORDER — CEFAZOLIN SODIUM 1-5 GM-% IV SOLN
1.0000 g | INTRAVENOUS | Status: AC
  Administered 2011-01-13: 1 g via INTRAVENOUS

## 2011-01-13 MED ORDER — MORPHINE SULFATE 2 MG/ML IJ SOLN: 0.0500 mg/kg | INTRAMUSCULAR | Status: DC | PRN

## 2011-01-13 MED ORDER — OXYCODONE HCL 5 MG PO TABS
5.0000 mg | ORAL_TABLET | ORAL | Status: DC | PRN
Start: 2011-01-13 — End: 2011-01-13

## 2011-01-13 MED ORDER — ONDANSETRON HCL 4 MG/2ML IJ SOLN: 4.0000 mg | Freq: Four times a day (QID) | INTRAMUSCULAR | Status: DC | PRN

## 2011-01-13 MED ORDER — EPHEDRINE SULFATE 50 MG/ML IJ SOLN
INTRAMUSCULAR | Status: DC | PRN
  Administered 2011-01-13 (×2): 10 mg via INTRAVENOUS

## 2011-01-13 MED ORDER — PROPOFOL 10 MG/ML IV EMUL
INTRAVENOUS | Status: DC | PRN
  Administered 2011-01-13: 200 mg via INTRAVENOUS

## 2011-01-13 MED ORDER — LIDOCAINE HCL (CARDIAC) 20 MG/ML IV SOLN
INTRAVENOUS | Status: DC | PRN
  Administered 2011-01-13: 60 mg via INTRAVENOUS

## 2011-01-13 MED ORDER — ACETAMINOPHEN 650 MG RE SUPP: 650.0000 mg | RECTAL | Status: DC | PRN

## 2011-01-13 MED ORDER — SODIUM CHLORIDE 0.9 % IV SOLN: INTRAVENOUS | Status: DC

## 2011-01-13 MED ORDER — PROMETHAZINE HCL 25 MG/ML IJ SOLN: 12.5000 mg | Freq: Four times a day (QID) | INTRAMUSCULAR | Status: DC | PRN

## 2011-01-13 MED ORDER — BUPIVACAINE-EPINEPHRINE 0.5% -1:200000 IJ SOLN
INTRAMUSCULAR | Status: DC | PRN
  Administered 2011-01-13: 17 mL

## 2011-01-13 MED ORDER — MIDAZOLAM HCL 2 MG/2ML IJ SOLN: 0.5000 mg | INTRAMUSCULAR | Status: DC | PRN

## 2011-01-13 MED ORDER — FENTANYL CITRATE 0.05 MG/ML IJ SOLN: 25.0000 ug | INTRAMUSCULAR | Status: DC | PRN

## 2011-01-13 MED ORDER — FENTANYL CITRATE 0.05 MG/ML IJ SOLN: 50.0000 ug | INTRAMUSCULAR | Status: DC | PRN

## 2011-01-13 MED ORDER — HYDROCODONE-ACETAMINOPHEN 5-325 MG PO TABS: 1.0000 | ORAL_TABLET | ORAL | Status: AC | PRN

## 2011-01-13 MED ORDER — LACTATED RINGERS IV SOLN
INTRAVENOUS | Status: DC
  Administered 2011-01-13: 09:00:00 via INTRAVENOUS

## 2011-01-13 MED ORDER — FENTANYL CITRATE 0.05 MG/ML IJ SOLN
INTRAMUSCULAR | Status: DC | PRN
  Administered 2011-01-13: 50 ug via INTRAVENOUS

## 2011-01-13 MED ORDER — DEXAMETHASONE SODIUM PHOSPHATE 4 MG/ML IJ SOLN
INTRAMUSCULAR | Status: DC | PRN
Start: 2011-01-13 — End: 2011-01-13
  Administered 2011-01-13: 10 mg via INTRAVENOUS

## 2011-01-13 MED ORDER — ACETAMINOPHEN 325 MG PO TABS: 650.0000 mg | ORAL_TABLET | ORAL | Status: DC | PRN

## 2011-01-13 SURGICAL SUPPLY — 56 items
ADH SKN CLS APL DERMABOND .7 (GAUZE/BANDAGES/DRESSINGS) ×2
APL SKNCLS STERI-STRIP NONHPOA (GAUZE/BANDAGES/DRESSINGS)
BENZOIN TINCTURE PRP APPL 2/3 (GAUZE/BANDAGES/DRESSINGS) IMPLANT
BLADE HEX COATED 2.75 (ELECTRODE) ×2 IMPLANT
BLADE SURG 15 STRL LF DISP TIS (BLADE) ×2 IMPLANT
BLADE SURG 15 STRL SS (BLADE) ×4
CANISTER SUCTION 1200CC (MISCELLANEOUS) ×2 IMPLANT
CHLORAPREP W/TINT 26ML (MISCELLANEOUS) ×2 IMPLANT
COVER MAYO STAND STRL (DRAPES) ×2 IMPLANT
COVER TABLE BACK 60X90 (DRAPES) ×2 IMPLANT
DECANTER SPIKE VIAL GLASS SM (MISCELLANEOUS) IMPLANT
DERMABOND ADVANCED (GAUZE/BANDAGES/DRESSINGS) ×2
DERMABOND ADVANCED .7 DNX12 (GAUZE/BANDAGES/DRESSINGS) IMPLANT
DEVICE DUBIN W/COMP PLATE 8390 (MISCELLANEOUS) ×2 IMPLANT
DRAPE LAPAROSCOPIC ABDOMINAL (DRAPES) IMPLANT
DRAPE PED LAPAROTOMY (DRAPES) ×2 IMPLANT
DRAPE UTILITY XL STRL (DRAPES) ×2 IMPLANT
DRSG EMULSION OIL 3X3 NADH (GAUZE/BANDAGES/DRESSINGS) IMPLANT
ELECT REM PT RETURN 9FT ADLT (ELECTROSURGICAL) ×2
ELECTRODE REM PT RTRN 9FT ADLT (ELECTROSURGICAL) ×1 IMPLANT
GAUZE SPONGE 4X4 12PLY STRL LF (GAUZE/BANDAGES/DRESSINGS) IMPLANT
GAUZE SPONGE 4X4 16PLY XRAY LF (GAUZE/BANDAGES/DRESSINGS) IMPLANT
GLOVE ECLIPSE 6.5 STRL STRAW (GLOVE) ×1 IMPLANT
GLOVE EUDERMIC 7 POWDERFREE (GLOVE) ×2 IMPLANT
GOWN PREVENTION PLUS XLARGE (GOWN DISPOSABLE) ×2 IMPLANT
GOWN PREVENTION PLUS XXLARGE (GOWN DISPOSABLE) ×2 IMPLANT
KIT MARKER MARGIN INK (KITS) ×2 IMPLANT
NDL HYPO 25X1 1.5 SAFETY (NEEDLE) ×1 IMPLANT
NEEDLE HYPO 22GX1.5 SAFETY (NEEDLE) IMPLANT
NEEDLE HYPO 25X1 1.5 SAFETY (NEEDLE) ×2 IMPLANT
NS IRRIG 1000ML POUR BTL (IV SOLUTION) ×2 IMPLANT
PACK BASIN DAY SURGERY FS (CUSTOM PROCEDURE TRAY) ×2 IMPLANT
PENCIL BUTTON HOLSTER BLD 10FT (ELECTRODE) ×2 IMPLANT
SLEEVE SCD COMPRESS KNEE MED (MISCELLANEOUS) ×1 IMPLANT
SPONGE LAP 18X18 X RAY DECT (DISPOSABLE) ×1 IMPLANT
STAPLER VISISTAT 35W (STAPLE) IMPLANT
STRIP CLOSURE SKIN 1/2X4 (GAUZE/BANDAGES/DRESSINGS) IMPLANT
SUCTION FRAZIER TIP 10 FR DISP (SUCTIONS) ×1 IMPLANT
SUT ETHILON 4 0 PS 2 18 (SUTURE) IMPLANT
SUT MON AB 4-0 PC3 18 (SUTURE) ×1 IMPLANT
SUT SILK 2 0 SH (SUTURE) ×2 IMPLANT
SUT VIC AB 2-0 SH 27 (SUTURE)
SUT VIC AB 2-0 SH 27XBRD (SUTURE) IMPLANT
SUT VIC AB 3-0 FS2 27 (SUTURE) IMPLANT
SUT VIC AB 4-0 P-3 18XBRD (SUTURE) IMPLANT
SUT VIC AB 4-0 P3 18 (SUTURE)
SUT VICRYL 3-0 CR8 SH (SUTURE) ×1 IMPLANT
SUT VICRYL 4-0 PS2 18IN ABS (SUTURE) IMPLANT
SYR BULB 3OZ (MISCELLANEOUS) IMPLANT
SYR CONTROL 10ML LL (SYRINGE) ×2 IMPLANT
TAPE HYPAFIX 4 X10 (GAUZE/BANDAGES/DRESSINGS) IMPLANT
TOWEL OR NON WOVEN STRL DISP B (DISPOSABLE) ×2 IMPLANT
TRAY DSU PREP LF (CUSTOM PROCEDURE TRAY) ×2 IMPLANT
TUBE CONNECTING 20X1/4 (TUBING) ×2 IMPLANT
WATER STERILE IRR 1000ML POUR (IV SOLUTION) ×1 IMPLANT
YANKAUER SUCT BULB TIP NO VENT (SUCTIONS) ×2 IMPLANT

## 2011-01-13 NOTE — Anesthesia Preprocedure Evaluation (Signed)
Anesthesia Evaluation  Patient identified by MRN, date of birth, ID band Patient awake    Reviewed: Allergy & Precautions, H&P , NPO status , Patient's Chart, lab work & pertinent test results, reviewed documented beta blocker date and time   Airway Mallampati: II TM Distance: >3 FB Neck ROM: full    Dental   Pulmonary neg pulmonary ROS,          Cardiovascular hypertension, On Medications neg cardio ROS     Neuro/Psych  Headaches, Negative Psych ROS   GI/Hepatic Neg liver ROS, GERD-  Medicated and Controlled,  Endo/Other  Negative Endocrine ROS  Renal/GU negative Renal ROS  Genitourinary negative   Musculoskeletal   Abdominal   Peds  Hematology negative hematology ROS (+)   Anesthesia Other Findings See surgeon's H&P   Reproductive/Obstetrics negative OB ROS                           Anesthesia Physical Anesthesia Plan  ASA: II  Anesthesia Plan: General   Post-op Pain Management:    Induction: Intravenous  Airway Management Planned: LMA  Additional Equipment:   Intra-op Plan:   Post-operative Plan: Extubation in OR  Informed Consent: I have reviewed the patients History and Physical, chart, labs and discussed the procedure including the risks, benefits and alternatives for the proposed anesthesia with the patient or authorized representative who has indicated his/her understanding and acceptance.     Plan Discussed with: CRNA and Surgeon  Anesthesia Plan Comments:         Anesthesia Quick Evaluation

## 2011-01-13 NOTE — Op Note (Signed)
Patient Name:           Erin Schmidt   Date of Surgery:        01/13/2011  Pre op Diagnosis:      Atypical ductal hyperplasia, left breast, 3:00 position  Post op Diagnosis:    same  Procedure:                 Left partial mastectomy with needle localization  Surgeon:                     Angelia Mould. Derrell Lolling, M.D., FACS  Assistant:                        Operative Indications:   This is a 78 year old Caucasian female who has had some breast biopsies in the remote past for benign disease. Recent screening mammography shows a 7 mm mass at the 3:00 position of the left breast 6 cm from the nipple. Image guided biopsy revealed a papillary lesion with atypical ductal hyperplasia. Physical exam reveals no palpable abnormality. There is some old scars in the breast. She has a scar laterally in the left breast. She is brought to the operating room electively for excision of this area.  Operative Findings:       The breast tissue that was removed from the lateral left breast contained the marker clip and the entire wire and was felt to be inadequate excision radiographically. There was no palpable mass. The tissues appeared somewhat scarred,   possibly from the previous biopsies.  Procedure in Detail:          Following the induction of general endotracheal anesthesia the patient's left breast was prepped and draped in a sterile fashion. Intravenous antibiotics were given. Surgical time out was held. I observed the localizing wire that entered from the far lateral left breast at the 3:00 position and was directed medially. I reviewed the breast images that had the localizing wire in place.  0.5% Marcaine with epinephrine was used as a local infiltrative anesthetic. A transverse, radially oriented incision was made in the lateral left breast through the localizing wire. Dissection was carried down into the breast tissue around the wire. Very quickly we actually got all the way to the pectoralis fascia. The  specimen was removed and marked with a 6 color margin marked kit. Specimen mammogram was performed and showed that the entire wire and a marker clip were within the specimen. The specimen was sent to pathology. Hemostasis was excellent and achieved with electrocautery. The wound was irrigated with saline. The deeper breast tissues were reapproximated with interrupted sutures of 3-0 Vicryl and the skin closed with a running subcuticular suture of 4-0 Monocryl and Dermabond. EBL 10 cc. Complications none. Counts correct.     Angelia Mould. Derrell Lolling, M.D., FACS General and Minimally Invasive Surgery Breast and Colorectal Surgery  01/13/2011 11:04 AM

## 2011-01-13 NOTE — Anesthesia Procedure Notes (Signed)
Procedure Name: LMA Insertion Date/Time: 01/13/2011 10:20 AM Performed by: Signa Kell Pre-anesthesia Checklist: Patient identified, Emergency Drugs available, Suction available and Patient being monitored Patient Re-evaluated:Patient Re-evaluated prior to inductionOxygen Delivery Method: Circle System Utilized Preoxygenation: Pre-oxygenation with 100% oxygen Intubation Type: IV induction Ventilation: Mask ventilation without difficulty LMA: LMA inserted LMA Size: 4.0 Number of attempts: 1 Airway Equipment and Method: bite block Placement Confirmation: positive ETCO2 Tube secured with: Tape Dental Injury: Teeth and Oropharynx as per pre-operative assessment

## 2011-01-13 NOTE — Interval H&P Note (Signed)
History and Physical Interval Note:  01/13/2011 10:00 AM  Erin Schmidt  has presented today for surgery, with the diagnosis of atypical ductal hyperplasia left breast  The goals of treatment and the various methods of treatment have been discussed with the patient and family. After consideration of risks, benefits and other options for treatment, the patient has consented to  Procedure(s): LEFT MASTECTOMY PARTIAL as a surgical intervention .  The patients' history has been reviewed today, the patient examined today , there is no change in status, she is stable for surgery.  I have reviewed the patients' chart and labs.  Questions were answered to the patient's satisfaction.     Lorissa Kishbaugh M 01/13/2011 10:01 AM

## 2011-01-13 NOTE — Anesthesia Postprocedure Evaluation (Signed)
Anesthesia Post Note  Patient: Erin Schmidt  Procedure(s) Performed:  MASTECTOMY PARTIAL - left partial mastectomy with needle locallization  Anesthesia type: General  Patient location: PACU  Post pain: Pain level controlled  Post assessment: Patient's Cardiovascular Status Stable  Last Vitals:  Filed Vitals:   01/13/11 1200  BP: 148/60  Pulse: 68  Temp: 36.6 C  Resp: 16    Post vital signs: Reviewed and stable  Level of consciousness: alert  Complications: No apparent anesthesia complications

## 2011-01-13 NOTE — Transfer of Care (Signed)
Immediate Anesthesia Transfer of Care Note  Patient: [name]  Procedure(s) Performed:  MASTECTOMY PARTIAL - left partial mastectomy with needle locallization  Patient Location: PACU  Anesthesia Type: General  Level of Consciousness: sedated  Airway & Oxygen Therapy: Patient Spontanous Breathing and Patient connected to face mask oxygen  Post-op Assessment: Report given to PACU RN and Post -op Vital signs reviewed and stable  Post vital signs: Reviewed and stable  Complications: No apparent anesthesia complications

## 2011-01-16 ENCOUNTER — Encounter (HOSPITAL_BASED_OUTPATIENT_CLINIC_OR_DEPARTMENT_OTHER): Payer: Self-pay | Admitting: General Surgery

## 2011-01-17 ENCOUNTER — Telehealth (INDEPENDENT_AMBULATORY_CARE_PROVIDER_SITE_OTHER): Payer: Self-pay

## 2011-01-17 NOTE — Telephone Encounter (Signed)
Pt notified of path result. Pt to keep po appt.

## 2011-01-24 ENCOUNTER — Encounter (INDEPENDENT_AMBULATORY_CARE_PROVIDER_SITE_OTHER): Payer: Self-pay | Admitting: General Surgery

## 2011-01-24 ENCOUNTER — Ambulatory Visit (INDEPENDENT_AMBULATORY_CARE_PROVIDER_SITE_OTHER): Payer: Medicare Other | Admitting: General Surgery

## 2011-01-24 VITALS — BP 130/74 | HR 100 | Temp 98.0°F | Ht 67.0 in | Wt 159.8 lb

## 2011-01-24 DIAGNOSIS — Z9889 Other specified postprocedural states: Secondary | ICD-10-CM

## 2011-01-24 NOTE — Progress Notes (Signed)
Subjective:     Patient ID: Erin Schmidt, female   DOB: 1932-04-24, 78 y.o.   MRN: [id number]  HPI Recent left breast biopsy has been performed, and she appears to be recovering without any complaints. She has no pain. No swelling. No drainage.  Final pathology report shows atypical ductal hyperplasia arising in a papilloma, and this area has been completely excised with negative margins and no atypia at the margins. I have given her a copy of the pathology report. Review of Systems     Objective:   Physical Exam Patient looks well and is in no distress.  Breast incision is healing uneventfully. No complications. No tenderness. No fluid collection. No drainage.   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    Plan:     The patient was advised that this is a slight risk factor for breast cancer in the future. She was advised that nothing further needs to be done at this time. She was strongly urged to have an annual breast exam with her primary care physician and to get annual mammography.  I told her I would be happy to see her at any time should any further problems arise. She is comfortable with this plan.  Angelia Mould. Derrell Lolling, M.D., [facility] General and Minimally invasive Surgery Breast and Colorectal Surgery Office:   [phone] Pager:   [phone]

## 2011-01-24 NOTE — Patient Instructions (Signed)
You appear to be healing  uneventfully following your recent breast biopsy.  The final pathology report shows atypical ductal hyperplasia arising in a papilloma. This entire area has been completely removed with negative margins.  Nothing further needs to be done, but you need to be aware that this is a risk factor for breast cancer in the future. I advise you to get an annual breast exam with your primary care physician, and advise you to continue to get annual mammograms.  I will be happy to see you at any time should any further abnormalities arise.

## 2011-02-10 ENCOUNTER — Encounter (INDEPENDENT_AMBULATORY_CARE_PROVIDER_SITE_OTHER): Payer: Self-pay | Admitting: *Deleted

## 2011-02-16 ENCOUNTER — Other Ambulatory Visit (INDEPENDENT_AMBULATORY_CARE_PROVIDER_SITE_OTHER): Payer: Self-pay | Admitting: *Deleted

## 2011-02-16 ENCOUNTER — Telehealth (INDEPENDENT_AMBULATORY_CARE_PROVIDER_SITE_OTHER): Payer: Self-pay | Admitting: *Deleted

## 2011-02-16 DIAGNOSIS — Z1211 Encounter for screening for malignant neoplasm of colon: Secondary | ICD-10-CM

## 2011-02-16 NOTE — Telephone Encounter (Signed)
Patient needs movi prep 

## 2011-02-17 MED ORDER — PEG-KCL-NACL-NASULF-NA ASC-C 100 G PO SOLR: 1.0000 | Freq: Once | ORAL | Status: DC

## 2011-03-16 ENCOUNTER — Telehealth (INDEPENDENT_AMBULATORY_CARE_PROVIDER_SITE_OTHER): Payer: Self-pay | Admitting: *Deleted

## 2011-03-16 NOTE — Telephone Encounter (Signed)
Requesting MD/PCP:  Phillips Odor     Name & DOB: Erin Schmidt 08/25/2032     Procedure: TCS  Reason/Indication:  screening  Has patient had this procedure before?  yes  If so, when, by whom and where?  2003  Is there a family history of colon cancer?  no  Who?  What age when diagnosed?    Is patient diabetic?   no      Does patient have prosthetic heart valve?  no  Do you have a pacemaker?  no  Has patient had joint replacement within last 12 months?  no  Is patient on Coumadin, Plavix and/or Aspirin? no  Medications: levothyroxine 100 mcg daily, vytorin 10/20 mg 1/2 tab daily, benazepril 20/12.5 mg daily, imipramine 25 mg bid, fexofenadine 180 mg 3-4 tab weekly, calcium w/ Vit D 600 mg bid, Omeprazole 20 mg daily  Allergies: nkda  Pharmacy: [facility]  Medication Adjustment: none  Procedure date & time: 04/13/11 at 930

## 2011-03-17 NOTE — Telephone Encounter (Signed)
agree

## 2011-03-31 ENCOUNTER — Encounter (HOSPITAL_COMMUNITY): Payer: Self-pay | Admitting: Pharmacy Technician

## 2011-04-13 ENCOUNTER — Ambulatory Visit (HOSPITAL_COMMUNITY)
Admission: RE | Admit: 2011-04-13 | Discharge: 2011-04-13 | Disposition: A | Discharge status: Home Health | Payer: Medicare Other | Source: Ambulatory Visit | Attending: Internal Medicine | Admitting: Internal Medicine

## 2011-04-13 ENCOUNTER — Encounter (HOSPITAL_COMMUNITY)
Admission: RE | Disposition: A | Discharge status: Home Health | Payer: Self-pay | Source: Ambulatory Visit | Attending: Internal Medicine

## 2011-04-13 ENCOUNTER — Encounter (HOSPITAL_COMMUNITY): Payer: Self-pay | Admitting: *Deleted

## 2011-04-13 DIAGNOSIS — D126 Benign neoplasm of colon, unspecified: Secondary | ICD-10-CM | POA: Insufficient documentation

## 2011-04-13 DIAGNOSIS — K5520 Angiodysplasia of colon without hemorrhage: Secondary | ICD-10-CM | POA: Insufficient documentation

## 2011-04-13 DIAGNOSIS — Z1211 Encounter for screening for malignant neoplasm of colon: Secondary | ICD-10-CM

## 2011-04-13 DIAGNOSIS — K5730 Diverticulosis of large intestine without perforation or abscess without bleeding: Secondary | ICD-10-CM | POA: Insufficient documentation

## 2011-04-13 DIAGNOSIS — K644 Residual hemorrhoidal skin tags: Secondary | ICD-10-CM | POA: Insufficient documentation

## 2011-04-13 DIAGNOSIS — I10 Essential (primary) hypertension: Secondary | ICD-10-CM | POA: Insufficient documentation

## 2011-04-13 DIAGNOSIS — Z79899 Other long term (current) drug therapy: Secondary | ICD-10-CM | POA: Insufficient documentation

## 2011-04-13 HISTORY — PX: COLONOSCOPY: SHX5424

## 2011-04-13 HISTORY — DX: Hypothyroidism, unspecified: E03.9

## 2011-04-13 SURGERY — COLONOSCOPY
Anesthesia: Moderate Sedation

## 2011-04-13 MED ORDER — MEPERIDINE HCL 50 MG/ML IJ SOLN
INTRAMUSCULAR | Status: AC
  Filled 2011-04-13: qty 1

## 2011-04-13 MED ORDER — MEPERIDINE HCL 50 MG/ML IJ SOLN
INTRAMUSCULAR | Status: DC | PRN
  Administered 2011-04-13 (×2): 25 mg via INTRAVENOUS

## 2011-04-13 MED ORDER — STERILE WATER FOR IRRIGATION IR SOLN
Status: DC | PRN
  Administered 2011-04-13: 10:00:00

## 2011-04-13 MED ORDER — SODIUM CHLORIDE 0.45 % IV SOLN
Freq: Once | INTRAVENOUS | Status: AC
  Administered 2011-04-13: 09:00:00 via INTRAVENOUS

## 2011-04-13 MED ORDER — MIDAZOLAM HCL 5 MG/5ML IJ SOLN
INTRAMUSCULAR | Status: AC
  Filled 2011-04-13: qty 10

## 2011-04-13 MED ORDER — MIDAZOLAM HCL 5 MG/5ML IJ SOLN
INTRAMUSCULAR | Status: DC | PRN
  Administered 2011-04-13: 1 mg via INTRAVENOUS
  Administered 2011-04-13 (×2): 2 mg via INTRAVENOUS

## 2011-04-13 NOTE — H&P (Signed)
Erin Schmidt is an 78 y.o. female.   Chief Complaint: Patient is here for colonoscopy. HPI: Patient is a 78 year old Caucasian female who is here for average risk screening colonoscopy. Patient's last exam was 10 years ago. She denies abdominal pain change in bowel habits or rectal bleeding. Family history is negative for colorectal carcinoma  Past Medical History  Diagnosis Date  . Thyroid disease   . Migraine   . Hypertension     preventative measures  . GERD (gastroesophageal reflux disease)   . Hyperlipemia   . Arthritis   . Hypothyroidism     Past Surgical History  Procedure Date  . Cholecystectomy   . Vaginal hysterectomy     2 partials  . Colonoscopy   . Appendectomy   . Mastectomy, partial 01/13/2011    Procedure: MASTECTOMY PARTIAL;  Surgeon: Ernestene Mention, MD;  Location: [facility];  Service: General;  Laterality: Left;  left partial mastectomy with needle locallization  . Breast lumpectomy 30 yrs ago    Dr. Shon Hale- rt breast  . Breast lumpectomy 15 yrs ago    left- Dr. Malvin Johns  . Breast lumpectomy 01/2011    Family History  Problem Relation Age of Onset  . Heart disease Mother   . Colon cancer Neg Hx    Social History:  reports that she quit smoking about 22 years ago. Her smoking use included Cigarettes. She has a 20 pack-year smoking history. She does not have any smokeless tobacco history on file. She reports that she does not drink alcohol or use illicit drugs.  Allergies: No Known Allergies  Medications Prior to Admission  Medication Dose Route Frequency Provider Last Rate Last Dose  . 0.45 % sodium chloride infusion   Intravenous Once Malissa Hippo, MD 20 mL/hr at 04/13/11 0916    . meperidine (DEMEROL) 50 MG/ML injection           . midazolam (VERSED) 5 MG/5ML injection            Medications Prior to Admission  Medication Sig Dispense Refill  . benazepril-hydrochlorthiazide (LOTENSIN HCT) 20-12.5 MG per tablet Take 1 tablet by  mouth daily.        . Calcium Carbonate-Vitamin D (CALCIUM + D PO) Take 1 tablet by mouth 2 (two) times daily.       Marland Kitchen ezetimibe-simvastatin (VYTORIN) 10-20 MG per tablet Take 0.5 tablets by mouth at bedtime.        . fexofenadine (ALLEGRA) 180 MG tablet Take 180 mg by mouth daily.        Marland Kitchen imipramine (TOFRANIL) 25 MG tablet Take 50 mg by mouth at bedtime.       Marland Kitchen levothyroxine (SYNTHROID, LEVOTHROID) 100 MCG tablet Take 100 mcg by mouth daily.        . naproxen sodium (ALEVE) 220 MG tablet Take 220 mg by mouth every 8 (eight) hours as needed. For pain      . omeprazole (PRILOSEC) 20 MG capsule Take 20 mg by mouth daily.        . peg 3350 powder (MOVIPREP) 100 G SOLR Take 1 kit (100 g total) by mouth once.  1 kit  0  . Propylene Glycol (SYSTANE BALANCE OP) Apply 1-2 drops to eye as needed. For dry eyes        No results found for this or any previous visit (from the past 48 hour(s)). No results found.  Review of Systems  Constitutional: Negative for weight  loss.  Gastrointestinal: Negative for abdominal pain, diarrhea, constipation, blood in stool and melena.    Blood pressure 163/68, pulse 71, temperature 97.6 F (36.4 C), temperature source Oral, resp. rate 18, height 5\' 7"  (1.702 m), weight 155 lb (70.308 kg), SpO2 98.00%. Physical Exam  Constitutional: She appears well-developed and well-nourished.  HENT:  Mouth/Throat: Oropharynx is clear and moist.  Eyes: Conjunctivae are normal. No scleral icterus.  Neck: No thyromegaly present.  Cardiovascular: Normal rate, regular rhythm and normal heart sounds.   No murmur heard. Respiratory: Effort normal and breath sounds normal.  GI: Soft. She exhibits no distension and no mass.  Musculoskeletal: She exhibits no edema.  Lymphadenopathy:    She has no cervical adenopathy.  Neurological: She is alert.  Skin: Skin is warm and dry.     Assessment/Plan Average risk screening colonoscopy.  Erin Schmidt 04/13/2011, 9:28 AM

## 2011-04-13 NOTE — Op Note (Signed)
COLONOSCOPY PROCEDURE REPORT  PATIENT:  Erin Schmidt  MR#:  [id number] Birthdate:  [birth year], 78 y.o., female Endoscopist:  Dr. Malissa Hippo, MD Referred By:  Dr. Colette Ribas, MD Procedure Date: 04/13/2011  Procedure:   Colonoscopy  Indications:  Average risk screening colonoscopy. Last exam was 10 years ago.  Informed Consent:  The procedure and risks were reviewed with the patient and informed consent was obtained.  Medications:  Demerol 50 mg IV Versed 5 mg IV  Description of procedure:  After a digital rectal exam was performed, that colonoscope was advanced from the anus through the rectum and colon to the area of the cecum, ileocecal valve and appendiceal orifice. The cecum was deeply intubated. These structures were well-seen and photographed for the record. From the level of the cecum and ileocecal valve, the scope was slowly and cautiously withdrawn. The mucosal surfaces were carefully surveyed utilizing scope tip to flexion to facilitate fold flattening as needed. The scope was pulled down into the rectum where a thorough exam including retroflexion was performed.  Findings:   Prep satisfactory. 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 3 mm polyp ablated via cold biopsy from hepatic flexure. Moderate number of diverticula and sigmoid colon. Small hemorrhoids below the dentate line.  Therapeutic/Diagnostic Maneuvers Performed:  See above  Complications:  None  Cecal Withdrawal Time:  9 minutes  Impression:  Examination performed to cecum. 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 3 mm polyp ablated via cold biopsy from hepatic flexure. Moderate sigmoid diverticulosis. Small external hemorrhoids.  Recommendations:  Standard instructions given. I will be contacting the patient with biopsy results.  Irby Fails U  04/13/2011 10:02 AM  CC: Dr. Colette Ribas, MD, MD & Dr. Bonnetta Barry ref. provider found

## 2011-04-13 NOTE — Discharge Instructions (Signed)
Resume medications and high fiber diet. No driving for 24 hours. Physician will contact you with biopsy results. Colonoscopy Care After These instructions give you information on caring for yourself after your procedure. Your doctor may also give you more specific instructions. Call your doctor if you have any problems or questions after your procedure. HOME CARE  Take it easy for the next 24 hours.   Rest.   Walk or use warm packs on your belly (abdomen) if you have belly cramping or gas.   Do not drive for 24 hours.   You may shower.   Do not sign important papers or use machinery for 24 hours.   Drink enough fluids to keep your pee (urine) clear or pale yellow.   Resume your normal diet. Avoid heavy or fried foods.   Avoid alcohol.   Continue taking your normal medicines.   Only take medicine as told by your doctor. Do not take aspirin.  If you had growths (polyps) removed:  Do not take aspirin.   Do not drink alcohol for 7 days or as told by your doctor.   Eat a soft diet for 24 hours.  GET HELP RIGHT AWAY IF:  You have a fever.   You pass clumps of tissue (blood clots) or fill the toilet with blood.   You have belly pain that gets worse and medicine does not help.   Your belly is puffy (swollen).   You feel sick to your stomach (nauseous) or throw up (vomit).  MAKE SURE YOU:  Understand these instructions.   Will watch your condition.   Will get help right away if you are not doing well or get worse.  Document Released: 01/28/2010 Document Revised: 12/15/2010 Document Reviewed: 01/28/2010 [facility] Patient Information 2012 [place], [place].           Colon Polyps A polyp is extra tissue that grows inside your body. Colon polyps grow in the large intestine. The large intestine, also called the colon, is part of your digestive system. It is a long, hollow tube at the end of your digestive tract where your body makes and stores stool. Most polyps  are not dangerous. They are benign. This means they are not cancerous. But over time, some types of polyps can turn into cancer. Polyps that are smaller than a pea are usually not harmful. But larger polyps could someday become or may already be cancerous. To be safe, doctors remove all polyps and test them.  WHO GETS POLYPS? Anyone can get polyps, but certain people are more likely than others. You may have a greater chance of getting polyps if:  You are over 50.   You have had polyps before.   Someone in your family has had polyps.   Someone in your family has had cancer of the large intestine.   Find out if someone in your family has had polyps. You may also be more likely to get polyps if you:   Eat a lot of fatty foods.   Smoke.   Drink alcohol.   Do not exercise.   Eat too much.  SYMPTOMS  Most small polyps do not cause symptoms. People often do not know they have one until their caregiver finds it during a regular checkup or while testing them for something else. Some people do have symptoms like these:  Bleeding from the anus. You might notice blood on your underwear or on toilet paper after you have had a bowel movement.   Constipation  or diarrhea that lasts more than a week.   Blood in the stool. Blood can make stool look black or it can show up as red streaks in the stool.  If you have any of these symptoms, see your caregiver. HOW DOES THE DOCTOR TEST FOR POLYPS? The doctor can use four tests to check for polyps:  Digital rectal exam. The caregiver wears gloves and checks your rectum (the last part of the large intestine) to see if it feels normal. This test would find polyps only in the rectum. Your caregiver may need to do one of the other tests listed below to find polyps higher up in the intestine.   Barium enema. The caregiver puts a liquid called barium into your rectum before taking x-rays of your large intestine. Barium makes your intestine look white in the  pictures. Polyps are dark, so they are easy to see.   Sigmoidoscopy. With this test, the caregiver can see inside your large intestine. A thin flexible tube is placed into your rectum. The device is called a sigmoidoscope, which has a light and a tiny video camera in it. The caregiver uses the sigmoidoscope to look at the last third of your large intestine.   Colonoscopy. This test is like sigmoidoscopy, but the caregiver looks at all of the large intestine. It usually requires sedation. This is the most common method for finding and removing polyps.  TREATMENT   The caregiver will remove the polyp during sigmoidoscopy or colonoscopy. The polyp is then tested for cancer.   If you have had polyps, your caregiver may want you to get tested regularly in the future.  PREVENTION  There is not one sure way to prevent polyps. You might be able to lower your risk of getting them if you:  Eat more fruits and vegetables and less fatty food.   Do not smoke.   Avoid alcohol.   Exercise every day.   Lose weight if you are overweight.   Eating more calcium and folate can also lower your risk of getting polyps. Some foods that are rich in calcium are milk, cheese, and broccoli. Some foods that are rich in folate are chickpeas, kidney beans, and spinach.   Aspirin might help prevent polyps. Studies are under way.  Document Released: 09/22/2003 Document Revised: 12/15/2010 Document Reviewed: 02/27/2007 [facility] Patient Information 2012 [place], [place].  High Fiber Diet A high fiber diet changes your normal diet to include more whole grains, legumes, fruits, and vegetables. Changes in the diet involve replacing refined carbohydrates with unrefined foods. The calorie level of the diet is essentially unchanged. The Dietary Reference Intake (recommended amount) for adult males is 38 g per day. For adult females, it is 25 g per day. Pregnant and lactating women should consume 28 g of fiber per day. Fiber is  the intact part of a plant that is not broken down during digestion. Functional fiber is fiber that has been isolated from the plant to provide a beneficial effect in the body. PURPOSE  Increase stool bulk.   Ease and regulate bowel movements.   Lower cholesterol.  INDICATIONS THAT YOU NEED MORE FIBER  Constipation and hemorrhoids.   Uncomplicated diverticulosis (intestine condition) and irritable bowel syndrome.   Weight management.   As a protective measure against hardening of the arteries (atherosclerosis), diabetes, and cancer.  NOTE OF CAUTION If you have a digestive or bowel problem, ask your caregiver for advice before adding high fiber foods to your diet. Some of  the following medical problems are such that a high fiber diet should not be used without consulting your caregiver:  Acute diverticulitis (intestine infection).   Partial small bowel obstructions.   Complicated diverticular disease involving bleeding, rupture (perforation), or abscess (boil, furuncle).   Presence of autonomic neuropathy (nerve damage) or gastric paresis (stomach cannot empty itself).  GUIDELINES FOR INCREASING FIBER  Start adding fiber to the diet slowly. A gradual increase of about 5 more grams (2 slices of whole-wheat bread, 2 servings of most fruits or vegetables, or 1 bowl of high fiber cereal) per day is best. Too rapid an increase in fiber may result in constipation, flatulence, and bloating.   Drink enough water and fluids to keep your urine clear or pale yellow. Water, juice, or caffeine-free drinks are recommended. Not drinking enough fluid may cause constipation.   Eat a variety of high fiber foods rather than one type of fiber.   Try to increase your intake of fiber through using high fiber foods rather than fiber pills or supplements that contain small amounts of fiber.   The goal is to change the types of food eaten. Do not supplement your present diet with high fiber foods, but  replace foods in your present diet.  INCLUDE A VARIETY OF FIBER SOURCES  Replace refined and processed grains with whole grains, canned fruits with fresh fruits, and incorporate other fiber sources. White rice, white breads, and most bakery goods contain little or no fiber.   Brown whole-grain rice, buckwheat oats, and many fruits and vegetables are all good sources of fiber. These include: broccoli, Brussels sprouts, cabbage, cauliflower, beets, sweet potatoes, white potatoes (skin on), carrots, tomatoes, eggplant, squash, berries, fresh fruits, and dried fruits.   Cereals appear to be the richest source of fiber. Cereal fiber is found in whole grains and bran. Bran is the fiber-rich outer coat of cereal grain, which is largely removed in refining. In whole-grain cereals, the bran remains. In breakfast cereals, the largest amount of fiber is found in those with "bran" in their names. The fiber content is sometimes indicated on the label.   You may need to include additional fruits and vegetables each day.   In baking, for 1 cup white flour, you may use the following substitutions:   1 cup whole-wheat flour minus 2 tbs.    cup white flour plus  cup whole-wheat flour.  Document Released: 12/26/2004 Document Revised: 12/15/2010 Document Reviewed: 11/03/2008 [facility] Patient Information 2012 [place], [place].

## 2011-04-20 ENCOUNTER — Encounter (HOSPITAL_COMMUNITY): Payer: Self-pay | Admitting: Internal Medicine

## 2011-04-21 ENCOUNTER — Encounter (INDEPENDENT_AMBULATORY_CARE_PROVIDER_SITE_OTHER): Payer: Self-pay | Admitting: *Deleted

## 2011-05-16 ENCOUNTER — Encounter (INDEPENDENT_AMBULATORY_CARE_PROVIDER_SITE_OTHER): Payer: Self-pay

## 2011-06-02 ENCOUNTER — Encounter (INDEPENDENT_AMBULATORY_CARE_PROVIDER_SITE_OTHER): Payer: Self-pay

## 2011-06-12 ENCOUNTER — Other Ambulatory Visit (HOSPITAL_COMMUNITY): Payer: Self-pay | Admitting: Endocrinology

## 2011-06-12 DIAGNOSIS — E041 Nontoxic single thyroid nodule: Secondary | ICD-10-CM

## 2011-06-14 ENCOUNTER — Ambulatory Visit (HOSPITAL_COMMUNITY)
Admission: RE | Admit: 2011-06-14 | Discharge: 2011-06-14 | Disposition: A | Discharge status: Home / Self Care | Payer: Medicare Other | Source: Ambulatory Visit | Attending: Endocrinology | Admitting: Endocrinology

## 2011-06-14 DIAGNOSIS — E041 Nontoxic single thyroid nodule: Secondary | ICD-10-CM

## 2011-06-14 DIAGNOSIS — E042 Nontoxic multinodular goiter: Secondary | ICD-10-CM | POA: Insufficient documentation

## 2011-07-05 ENCOUNTER — Other Ambulatory Visit (HOSPITAL_COMMUNITY): Payer: Self-pay | Admitting: Family Medicine

## 2011-07-05 DIAGNOSIS — Z01419 Encounter for gynecological examination (general) (routine) without abnormal findings: Secondary | ICD-10-CM

## 2011-07-05 DIAGNOSIS — Z139 Encounter for screening, unspecified: Secondary | ICD-10-CM

## 2011-07-05 DIAGNOSIS — E785 Hyperlipidemia, unspecified: Secondary | ICD-10-CM

## 2011-07-10 ENCOUNTER — Ambulatory Visit (HOSPITAL_COMMUNITY)
Admission: RE | Admit: 2011-07-10 | Discharge: 2011-07-10 | Disposition: A | Discharge status: Home / Self Care | Payer: Medicare Other | Source: Ambulatory Visit | Attending: Family Medicine | Admitting: Family Medicine

## 2011-07-10 DIAGNOSIS — Z01419 Encounter for gynecological examination (general) (routine) without abnormal findings: Secondary | ICD-10-CM | POA: Insufficient documentation

## 2011-07-10 DIAGNOSIS — Z139 Encounter for screening, unspecified: Secondary | ICD-10-CM

## 2011-07-10 DIAGNOSIS — R0989 Other specified symptoms and signs involving the circulatory and respiratory systems: Secondary | ICD-10-CM | POA: Insufficient documentation

## 2011-07-10 DIAGNOSIS — M899 Disorder of bone, unspecified: Secondary | ICD-10-CM | POA: Insufficient documentation

## 2011-07-10 DIAGNOSIS — E785 Hyperlipidemia, unspecified: Secondary | ICD-10-CM | POA: Insufficient documentation

## 2011-07-10 DIAGNOSIS — M949 Disorder of cartilage, unspecified: Secondary | ICD-10-CM | POA: Insufficient documentation

## 2011-10-25 ENCOUNTER — Other Ambulatory Visit (HOSPITAL_COMMUNITY): Payer: Self-pay | Admitting: Family Medicine

## 2011-10-25 DIAGNOSIS — C50919 Malignant neoplasm of unspecified site of unspecified female breast: Secondary | ICD-10-CM

## 2011-10-25 DIAGNOSIS — Z139 Encounter for screening, unspecified: Secondary | ICD-10-CM

## 2011-11-30 ENCOUNTER — Inpatient Hospital Stay (HOSPITAL_COMMUNITY): Admission: RE | Admit: 2011-11-30 | Payer: Medicare Other | Source: Ambulatory Visit

## 2011-12-12 ENCOUNTER — Ambulatory Visit (HOSPITAL_COMMUNITY): Payer: Medicare Other

## 2011-12-13 ENCOUNTER — Ambulatory Visit (HOSPITAL_COMMUNITY): Payer: Medicare Other

## 2011-12-18 ENCOUNTER — Ambulatory Visit (HOSPITAL_COMMUNITY)
Admission: RE | Admit: 2011-12-18 | Discharge: 2011-12-18 | Disposition: A | Discharge status: Home / Self Care | Payer: Medicare Other | Source: Ambulatory Visit | Attending: Family Medicine | Admitting: Family Medicine

## 2011-12-18 DIAGNOSIS — Z1231 Encounter for screening mammogram for malignant neoplasm of breast: Secondary | ICD-10-CM | POA: Insufficient documentation

## 2011-12-18 DIAGNOSIS — Z139 Encounter for screening, unspecified: Secondary | ICD-10-CM

## 2012-06-11 ENCOUNTER — Other Ambulatory Visit (HOSPITAL_COMMUNITY): Payer: Self-pay | Admitting: Endocrinology

## 2012-06-11 DIAGNOSIS — E049 Nontoxic goiter, unspecified: Secondary | ICD-10-CM

## 2012-06-14 ENCOUNTER — Ambulatory Visit (HOSPITAL_COMMUNITY): Payer: Medicare Other

## 2012-06-21 ENCOUNTER — Ambulatory Visit (HOSPITAL_COMMUNITY)
Admission: RE | Admit: 2012-06-21 | Discharge: 2012-06-21 | Disposition: A | Discharge status: Home / Self Care | Payer: Medicare Other | Source: Ambulatory Visit | Attending: Endocrinology | Admitting: Endocrinology

## 2012-06-21 DIAGNOSIS — E042 Nontoxic multinodular goiter: Secondary | ICD-10-CM | POA: Insufficient documentation

## 2012-06-21 DIAGNOSIS — E049 Nontoxic goiter, unspecified: Secondary | ICD-10-CM | POA: Insufficient documentation

## 2012-07-09 ENCOUNTER — Other Ambulatory Visit (HOSPITAL_COMMUNITY): Payer: Self-pay | Admitting: Endocrinology

## 2012-07-09 DIAGNOSIS — E041 Nontoxic single thyroid nodule: Secondary | ICD-10-CM

## 2012-07-11 ENCOUNTER — Other Ambulatory Visit (HOSPITAL_COMMUNITY): Payer: Self-pay | Admitting: Endocrinology

## 2012-07-11 ENCOUNTER — Ambulatory Visit (HOSPITAL_COMMUNITY)
Admission: RE | Admit: 2012-07-11 | Discharge: 2012-07-11 | Disposition: A | Discharge status: Home / Self Care | Payer: Medicare Other | Source: Ambulatory Visit | Attending: Endocrinology | Admitting: Endocrinology

## 2012-07-11 DIAGNOSIS — E041 Nontoxic single thyroid nodule: Secondary | ICD-10-CM

## 2012-07-11 MED ORDER — LIDOCAINE HCL (PF) 2 % IJ SOLN
INTRAMUSCULAR | Status: AC
  Filled 2012-07-11: qty 10

## 2012-07-26 ENCOUNTER — Other Ambulatory Visit (HOSPITAL_COMMUNITY): Payer: Self-pay | Admitting: Endocrinology

## 2012-07-26 DIAGNOSIS — E049 Nontoxic goiter, unspecified: Secondary | ICD-10-CM

## 2012-10-08 ENCOUNTER — Emergency Department (HOSPITAL_COMMUNITY): Payer: No Typology Code available for payment source

## 2012-10-08 ENCOUNTER — Encounter (HOSPITAL_COMMUNITY): Payer: Self-pay | Admitting: *Deleted

## 2012-10-08 ENCOUNTER — Inpatient Hospital Stay (HOSPITAL_COMMUNITY)
Admission: EM | Admit: 2012-10-08 | Discharge: 2012-10-10 | DRG: 184 | Disposition: A | Discharge status: Home / Self Care | Payer: No Typology Code available for payment source | Attending: General Surgery | Admitting: General Surgery

## 2012-10-08 DIAGNOSIS — S52509A Unspecified fracture of the lower end of unspecified radius, initial encounter for closed fracture: Secondary | ICD-10-CM

## 2012-10-08 DIAGNOSIS — S2220XA Unspecified fracture of sternum, initial encounter for closed fracture: Principal | ICD-10-CM | POA: Diagnosis present

## 2012-10-08 DIAGNOSIS — K219 Gastro-esophageal reflux disease without esophagitis: Secondary | ICD-10-CM | POA: Diagnosis present

## 2012-10-08 DIAGNOSIS — I10 Essential (primary) hypertension: Secondary | ICD-10-CM

## 2012-10-08 DIAGNOSIS — G43909 Migraine, unspecified, not intractable, without status migrainosus: Secondary | ICD-10-CM | POA: Diagnosis present

## 2012-10-08 DIAGNOSIS — Z87891 Personal history of nicotine dependence: Secondary | ICD-10-CM

## 2012-10-08 DIAGNOSIS — S52501A Unspecified fracture of the lower end of right radius, initial encounter for closed fracture: Secondary | ICD-10-CM

## 2012-10-08 DIAGNOSIS — Z79899 Other long term (current) drug therapy: Secondary | ICD-10-CM

## 2012-10-08 DIAGNOSIS — E785 Hyperlipidemia, unspecified: Secondary | ICD-10-CM | POA: Diagnosis present

## 2012-10-08 DIAGNOSIS — S20211A Contusion of right front wall of thorax, initial encounter: Secondary | ICD-10-CM

## 2012-10-08 DIAGNOSIS — Z853 Personal history of malignant neoplasm of breast: Secondary | ICD-10-CM

## 2012-10-08 DIAGNOSIS — S52599A Other fractures of lower end of unspecified radius, initial encounter for closed fracture: Secondary | ICD-10-CM | POA: Diagnosis present

## 2012-10-08 DIAGNOSIS — E039 Hypothyroidism, unspecified: Secondary | ICD-10-CM | POA: Diagnosis present

## 2012-10-08 LAB — POCT I-STAT, CHEM 8
BUN: 18 mg/dL (ref 6–23)
Calcium, Ion: 1.1 mmol/L — ABNORMAL LOW (ref 1.13–1.30)
Creatinine, Ser: 0.8 mg/dL (ref 0.50–1.10)
Hemoglobin: 12.9 g/dL (ref 12.0–15.0)
TCO2: 26 mmol/L (ref 0–100)

## 2012-10-08 LAB — CBC WITH DIFFERENTIAL/PLATELET
Basophils Absolute: 0 10*3/uL (ref 0.0–0.1)
Eosinophils Absolute: 0 10*3/uL (ref 0.0–0.7)
Hemoglobin: 12.3 g/dL (ref 12.0–15.0)
Lymphocytes Relative: 8 % — ABNORMAL LOW (ref 12–46)
Lymphs Abs: 1.1 10*3/uL (ref 0.7–4.0)
MCH: 28.3 pg (ref 26.0–34.0)
MCV: 83.4 fL (ref 78.0–100.0)
Monocytes Relative: 6 % (ref 3–12)
Neutrophils Relative %: 86 % — ABNORMAL HIGH (ref 43–77)
RBC: 4.35 MIL/uL (ref 3.87–5.11)
RDW: 14.4 % (ref 11.5–15.5)
WBC: 14 10*3/uL — ABNORMAL HIGH (ref 4.0–10.5)

## 2012-10-08 LAB — POCT I-STAT TROPONIN I

## 2012-10-08 MED ORDER — IOHEXOL 300 MG/ML  SOLN
80.0000 mL | Freq: Once | INTRAMUSCULAR | Status: AC | PRN
  Administered 2012-10-08: 80 mL via INTRAVENOUS

## 2012-10-08 MED ORDER — MORPHINE SULFATE 2 MG/ML IJ SOLN
2.0000 mg | Freq: Once | INTRAMUSCULAR | Status: AC
  Administered 2012-10-08: 2 mg via INTRAVENOUS
  Filled 2012-10-08: qty 1
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
## 2012-10-09 ENCOUNTER — Encounter (HOSPITAL_COMMUNITY): Payer: Self-pay | Admitting: Surgery

## 2012-10-09 DIAGNOSIS — S52599A Other fractures of lower end of unspecified radius, initial encounter for closed fracture: Secondary | ICD-10-CM

## 2012-10-09 DIAGNOSIS — S52509A Unspecified fracture of the lower end of unspecified radius, initial encounter for closed fracture: Secondary | ICD-10-CM

## 2012-10-09 DIAGNOSIS — S2220XA Unspecified fracture of sternum, initial encounter for closed fracture: Secondary | ICD-10-CM

## 2012-10-09 LAB — CBC
Hemoglobin: 12.1 g/dL (ref 12.0–15.0)
MCH: 27.9 pg (ref 26.0–34.0)
RBC: 4.33 MIL/uL (ref 3.87–5.11)
RDW: 14.6 % (ref 11.5–15.5)

## 2012-10-09 LAB — BASIC METABOLIC PANEL
CO2: 24 mEq/L (ref 19–32)
Chloride: 100 mEq/L (ref 96–112)
Glucose, Bld: 148 mg/dL — ABNORMAL HIGH (ref 70–99)
Potassium: 3.2 mEq/L — ABNORMAL LOW (ref 3.5–5.1)
Sodium: 137 mEq/L (ref 135–145)

## 2012-10-09 MED ORDER — HYDROMORPHONE HCL PF 1 MG/ML IJ SOLN: 1.0000 mg | INTRAMUSCULAR | Status: DC | PRN

## 2012-10-09 MED ORDER — HYDROCHLOROTHIAZIDE 12.5 MG PO CAPS
12.5000 mg | ORAL_CAPSULE | Freq: Every day | ORAL | Status: DC
  Administered 2012-10-09 – 2012-10-10 (×2): 12.5 mg via ORAL
  Filled 2012-10-09 (×2): qty 1

## 2012-10-09 MED ORDER — DEXTROSE-NACL 5-0.9 % IV SOLN
INTRAVENOUS | Status: DC
  Administered 2012-10-09: 05:00:00 via INTRAVENOUS

## 2012-10-09 MED ORDER — LEVOTHYROXINE SODIUM 100 MCG PO TABS
100.0000 ug | ORAL_TABLET | Freq: Every day | ORAL | Status: DC
  Administered 2012-10-09 – 2012-10-10 (×2): 100 ug via ORAL
  Filled 2012-10-09 (×3): qty 1

## 2012-10-09 MED ORDER — ONDANSETRON HCL 4 MG PO TABS
4.0000 mg | ORAL_TABLET | Freq: Four times a day (QID) | ORAL | Status: DC | PRN
  Administered 2012-10-10: 4 mg via ORAL
  Filled 2012-10-09: qty 1

## 2012-10-09 MED ORDER — MORPHINE SULFATE 2 MG/ML IJ SOLN
2.0000 mg | Freq: Once | INTRAMUSCULAR | Status: AC
  Administered 2012-10-09: 2 mg via INTRAVENOUS
  Filled 2012-10-09: qty 1

## 2012-10-09 MED ORDER — OXYCODONE HCL 5 MG PO TABS
10.0000 mg | ORAL_TABLET | ORAL | Status: DC | PRN
  Administered 2012-10-09: 10 mg via ORAL
  Filled 2012-10-09 (×2): qty 2

## 2012-10-09 MED ORDER — PANTOPRAZOLE SODIUM 40 MG PO TBEC
40.0000 mg | DELAYED_RELEASE_TABLET | Freq: Every day | ORAL | Status: DC
  Administered 2012-10-09 – 2012-10-10 (×2): 40 mg via ORAL
  Filled 2012-10-09 (×2): qty 1

## 2012-10-09 MED ORDER — BENAZEPRIL-HYDROCHLOROTHIAZIDE 20-12.5 MG PO TABS: 1.0000 | ORAL_TABLET | Freq: Every day | ORAL | Status: DC

## 2012-10-09 MED ORDER — SODIUM CHLORIDE 0.9 % IJ SOLN: 3.0000 mL | INTRAMUSCULAR | Status: DC | PRN

## 2012-10-09 MED ORDER — BENAZEPRIL HCL 20 MG PO TABS
20.0000 mg | ORAL_TABLET | Freq: Every day | ORAL | Status: DC
  Administered 2012-10-09 – 2012-10-10 (×2): 20 mg via ORAL
  Filled 2012-10-09 (×2): qty 1

## 2012-10-09 MED ORDER — ENOXAPARIN SODIUM 40 MG/0.4ML ~~LOC~~ SOLN
40.0000 mg | SUBCUTANEOUS | Status: DC
  Administered 2012-10-09 – 2012-10-10 (×2): 40 mg via SUBCUTANEOUS
  Filled 2012-10-09 (×3): qty 0.4

## 2012-10-09 MED ORDER — ONDANSETRON HCL 4 MG/2ML IJ SOLN
4.0000 mg | Freq: Once | INTRAMUSCULAR | Status: AC
  Administered 2012-10-09: 4 mg via INTRAVENOUS
  Filled 2012-10-09: qty 2

## 2012-10-09 MED ORDER — HYDROCODONE-ACETAMINOPHEN 5-325 MG PO TABS
1.0000 | ORAL_TABLET | ORAL | Status: DC | PRN
  Administered 2012-10-09 (×2): 2 via ORAL
  Administered 2012-10-10: 1 via ORAL
  Administered 2012-10-10: 2 via ORAL
  Administered 2012-10-10: 1 via ORAL
  Filled 2012-10-09: qty 1
  Filled 2012-10-09 (×4): qty 2

## 2012-10-09 MED ORDER — ONDANSETRON HCL 4 MG/2ML IJ SOLN
4.0000 mg | Freq: Four times a day (QID) | INTRAMUSCULAR | Status: DC | PRN
  Administered 2012-10-09: 4 mg via INTRAVENOUS
  Filled 2012-10-09: qty 2

## 2012-10-09 MED ORDER — SIMVASTATIN 10 MG PO TABS
10.0000 mg | ORAL_TABLET | Freq: Every day | ORAL | Status: DC
  Administered 2012-10-09: 10 mg via ORAL
  Filled 2012-10-09 (×2): qty 1
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
## 2012-10-09 NOTE — ED Notes (Signed)
[facility] here to transport patient at this time, report and paperwork given to crew. Patient alert and oriented, assisted over to [facility] stretcher and transported to [place] [facility]. Family has patient's belongings.
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
## 2012-10-10 ENCOUNTER — Encounter (HOSPITAL_COMMUNITY): Payer: Self-pay | Admitting: General Surgery

## 2012-10-10 DIAGNOSIS — R110 Nausea: Secondary | ICD-10-CM

## 2012-10-10 MED ORDER — HYDROCODONE-ACETAMINOPHEN 5-325 MG PO TABS: 1.0000 | ORAL_TABLET | ORAL | Status: DC | PRN

## 2012-10-10 MED ORDER — IMIPRAMINE HCL 50 MG PO TABS
50.0000 mg | ORAL_TABLET | Freq: Every day | ORAL | Status: DC
  Administered 2012-10-10: 50 mg via ORAL
  Filled 2012-10-10 (×2): qty 1

## 2012-10-10 MED ORDER — ONDANSETRON HCL 4 MG PO TABS: 4.0000 mg | ORAL_TABLET | Freq: Four times a day (QID) | ORAL | Status: DC | PRN

## 2012-10-10 NOTE — Discharge Summary (Signed)
Lynzi Meulemans, MD, MPH, FACS Pager: [phone]  
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
## 2012-10-10 NOTE — Progress Notes (Signed)
Assessment unchanged. Discussed D/C instructions with pt and family. Verbalized understanding. RX given to pt. IV and tele removed. Pt left via W/C with belongings accompanied by NT.

## 2012-10-10 NOTE — Progress Notes (Signed)
Home this PM if nausea resolves. So far she has kept down some oatmeal. Patient examined and I agree with the assessment and plan  Violeta Gelinas, MD, MPH, FACS Pager: [phone]  10/10/2012 10:07 AM
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
## 2012-10-22 ENCOUNTER — Ambulatory Visit (HOSPITAL_COMMUNITY)
Admission: RE | Admit: 2012-10-22 | Discharge: 2012-10-22 | Disposition: A | Discharge status: Home / Self Care | Payer: No Typology Code available for payment source | Source: Ambulatory Visit | Attending: Family Medicine | Admitting: Family Medicine

## 2012-10-22 ENCOUNTER — Other Ambulatory Visit (HOSPITAL_COMMUNITY): Payer: Self-pay | Admitting: Family Medicine

## 2012-10-22 DIAGNOSIS — S2220XA Unspecified fracture of sternum, initial encounter for closed fracture: Secondary | ICD-10-CM

## 2012-10-22 DIAGNOSIS — IMO0001 Reserved for inherently not codable concepts without codable children: Secondary | ICD-10-CM | POA: Insufficient documentation

## 2012-12-31 ENCOUNTER — Other Ambulatory Visit (HOSPITAL_COMMUNITY): Payer: Self-pay | Admitting: Family Medicine

## 2012-12-31 DIAGNOSIS — Z139 Encounter for screening, unspecified: Secondary | ICD-10-CM

## 2013-01-20 ENCOUNTER — Ambulatory Visit (HOSPITAL_COMMUNITY)
Admission: RE | Admit: 2013-01-20 | Discharge: 2013-01-20 | Disposition: A | Discharge status: Home / Self Care | Payer: No Typology Code available for payment source | Source: Ambulatory Visit | Attending: Family Medicine | Admitting: Family Medicine

## 2013-01-20 DIAGNOSIS — M2560 Stiffness of unspecified joint, not elsewhere classified: Secondary | ICD-10-CM

## 2013-01-20 DIAGNOSIS — M5380 Other specified dorsopathies, site unspecified: Secondary | ICD-10-CM | POA: Insufficient documentation

## 2013-01-20 DIAGNOSIS — M2550 Pain in unspecified joint: Secondary | ICD-10-CM | POA: Insufficient documentation

## 2013-01-20 DIAGNOSIS — IMO0001 Reserved for inherently not codable concepts without codable children: Secondary | ICD-10-CM | POA: Insufficient documentation

## 2013-01-20 DIAGNOSIS — M25639 Stiffness of unspecified wrist, not elsewhere classified: Secondary | ICD-10-CM | POA: Insufficient documentation

## 2013-01-20 DIAGNOSIS — M25539 Pain in unspecified wrist: Secondary | ICD-10-CM | POA: Insufficient documentation

## 2013-01-20 DIAGNOSIS — M546 Pain in thoracic spine: Secondary | ICD-10-CM | POA: Insufficient documentation

## 2013-01-20 DIAGNOSIS — M62838 Other muscle spasm: Secondary | ICD-10-CM

## 2013-01-20 DIAGNOSIS — M542 Cervicalgia: Secondary | ICD-10-CM | POA: Insufficient documentation

## 2013-01-20 DIAGNOSIS — I10 Essential (primary) hypertension: Secondary | ICD-10-CM | POA: Insufficient documentation
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
## 2013-01-22 ENCOUNTER — Ambulatory Visit (HOSPITAL_COMMUNITY)
Admission: RE | Admit: 2013-01-22 | Discharge: 2013-01-22 | Disposition: A | Discharge status: Home / Self Care | Payer: No Typology Code available for payment source | Source: Ambulatory Visit | Attending: Family Medicine | Admitting: Family Medicine
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
## 2013-01-24 ENCOUNTER — Ambulatory Visit (HOSPITAL_COMMUNITY)
Admission: RE | Admit: 2013-01-24 | Discharge: 2013-01-24 | Disposition: A | Discharge status: Home / Self Care | Payer: No Typology Code available for payment source | Source: Ambulatory Visit | Attending: Family Medicine | Admitting: Family Medicine

## 2013-01-24 DIAGNOSIS — M546 Pain in thoracic spine: Secondary | ICD-10-CM

## 2013-01-24 DIAGNOSIS — M2560 Stiffness of unspecified joint, not elsewhere classified: Secondary | ICD-10-CM

## 2013-01-24 DIAGNOSIS — M62838 Other muscle spasm: Secondary | ICD-10-CM
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
## 2013-01-27 ENCOUNTER — Ambulatory Visit (HOSPITAL_COMMUNITY)
Admission: RE | Admit: 2013-01-27 | Discharge: 2013-01-27 | Disposition: A | Discharge status: Home / Self Care | Payer: Medicare FFS | Source: Ambulatory Visit | Attending: Family Medicine | Admitting: Family Medicine

## 2013-01-27 ENCOUNTER — Ambulatory Visit (HOSPITAL_COMMUNITY): Admission: RE | Admit: 2013-01-27 | Payer: Medicare Other | Source: Ambulatory Visit

## 2013-01-27 ENCOUNTER — Ambulatory Visit (HOSPITAL_COMMUNITY)
Admission: RE | Admit: 2013-01-27 | Discharge: 2013-01-27 | Disposition: A | Discharge status: Home / Self Care | Payer: Medicare FFS | Source: Ambulatory Visit | Attending: Endocrinology | Admitting: Endocrinology

## 2013-01-27 ENCOUNTER — Ambulatory Visit (HOSPITAL_COMMUNITY)
Admission: RE | Admit: 2013-01-27 | Discharge: 2013-01-27 | Disposition: A | Discharge status: Home / Self Care | Payer: No Typology Code available for payment source | Source: Ambulatory Visit | Attending: Family Medicine | Admitting: Family Medicine

## 2013-01-27 ENCOUNTER — Other Ambulatory Visit (HOSPITAL_COMMUNITY): Payer: Self-pay | Admitting: Endocrinology

## 2013-01-27 DIAGNOSIS — Z139 Encounter for screening, unspecified: Secondary | ICD-10-CM

## 2013-01-27 DIAGNOSIS — E049 Nontoxic goiter, unspecified: Secondary | ICD-10-CM

## 2013-01-27 DIAGNOSIS — E042 Nontoxic multinodular goiter: Secondary | ICD-10-CM | POA: Insufficient documentation

## 2013-01-27 DIAGNOSIS — Z1231 Encounter for screening mammogram for malignant neoplasm of breast: Secondary | ICD-10-CM | POA: Insufficient documentation
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
## 2013-01-29 ENCOUNTER — Ambulatory Visit (HOSPITAL_COMMUNITY)
Admission: RE | Admit: 2013-01-29 | Discharge: 2013-01-29 | Disposition: A | Discharge status: Home / Self Care | Payer: No Typology Code available for payment source | Source: Ambulatory Visit | Attending: Family Medicine | Admitting: Family Medicine
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
## 2013-01-31 ENCOUNTER — Ambulatory Visit (HOSPITAL_COMMUNITY)
Admission: RE | Admit: 2013-01-31 | Discharge: 2013-01-31 | Disposition: A | Discharge status: Home / Self Care | Payer: No Typology Code available for payment source | Source: Ambulatory Visit | Attending: Family Medicine | Admitting: Family Medicine

## 2013-01-31 DIAGNOSIS — M546 Pain in thoracic spine: Secondary | ICD-10-CM

## 2013-01-31 DIAGNOSIS — M2560 Stiffness of unspecified joint, not elsewhere classified: Secondary | ICD-10-CM

## 2013-01-31 DIAGNOSIS — M62838 Other muscle spasm: Secondary | ICD-10-CM
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
## 2013-02-03 ENCOUNTER — Ambulatory Visit (HOSPITAL_COMMUNITY): Payer: Medicare FFS | Admitting: Physical Therapy

## 2013-02-03 ENCOUNTER — Telehealth (HOSPITAL_COMMUNITY): Payer: Self-pay

## 2013-02-05 ENCOUNTER — Ambulatory Visit (HOSPITAL_COMMUNITY)
Admission: RE | Admit: 2013-02-05 | Discharge: 2013-02-05 | Disposition: A | Discharge status: Home / Self Care | Payer: No Typology Code available for payment source | Source: Ambulatory Visit | Attending: Family Medicine | Admitting: Family Medicine
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
## 2013-02-07 ENCOUNTER — Ambulatory Visit (HOSPITAL_COMMUNITY)
Admission: RE | Admit: 2013-02-07 | Discharge: 2013-02-07 | Disposition: A | Discharge status: Home / Self Care | Payer: No Typology Code available for payment source | Source: Ambulatory Visit | Attending: Family Medicine | Admitting: Family Medicine

## 2013-02-07 DIAGNOSIS — M546 Pain in thoracic spine: Secondary | ICD-10-CM

## 2013-02-07 DIAGNOSIS — M2560 Stiffness of unspecified joint, not elsewhere classified: Secondary | ICD-10-CM

## 2013-02-07 DIAGNOSIS — M62838 Other muscle spasm: Secondary | ICD-10-CM
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
## 2013-02-13 ENCOUNTER — Ambulatory Visit (HOSPITAL_COMMUNITY)
Admission: RE | Admit: 2013-02-13 | Discharge: 2013-02-13 | Disposition: A | Discharge status: Home / Self Care | Payer: No Typology Code available for payment source | Source: Ambulatory Visit | Attending: Family Medicine | Admitting: Family Medicine

## 2013-02-13 DIAGNOSIS — M5380 Other specified dorsopathies, site unspecified: Secondary | ICD-10-CM | POA: Insufficient documentation

## 2013-02-13 DIAGNOSIS — M2560 Stiffness of unspecified joint, not elsewhere classified: Secondary | ICD-10-CM

## 2013-02-13 DIAGNOSIS — M25639 Stiffness of unspecified wrist, not elsewhere classified: Secondary | ICD-10-CM | POA: Insufficient documentation

## 2013-02-13 DIAGNOSIS — M546 Pain in thoracic spine: Secondary | ICD-10-CM | POA: Insufficient documentation

## 2013-02-13 DIAGNOSIS — M62838 Other muscle spasm: Secondary | ICD-10-CM

## 2013-02-13 DIAGNOSIS — M542 Cervicalgia: Secondary | ICD-10-CM | POA: Insufficient documentation

## 2013-02-13 DIAGNOSIS — I10 Essential (primary) hypertension: Secondary | ICD-10-CM | POA: Insufficient documentation

## 2013-02-13 DIAGNOSIS — IMO0001 Reserved for inherently not codable concepts without codable children: Secondary | ICD-10-CM | POA: Insufficient documentation

## 2013-02-13 DIAGNOSIS — M25539 Pain in unspecified wrist: Secondary | ICD-10-CM | POA: Insufficient documentation
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
## 2013-02-17 ENCOUNTER — Telehealth (HOSPITAL_COMMUNITY): Payer: Self-pay

## 2013-02-17 ENCOUNTER — Ambulatory Visit (HOSPITAL_COMMUNITY): Payer: Medicare FFS | Admitting: Physical Therapy

## 2013-02-20 ENCOUNTER — Ambulatory Visit (HOSPITAL_COMMUNITY): Payer: Medicare FFS

## 2013-02-24 ENCOUNTER — Ambulatory Visit (HOSPITAL_COMMUNITY): Payer: Medicare FFS | Admitting: Physical Therapy

## 2013-02-26 ENCOUNTER — Ambulatory Visit (HOSPITAL_COMMUNITY): Payer: Medicare FFS | Admitting: Physical Therapy

## 2013-03-03 ENCOUNTER — Other Ambulatory Visit: Payer: Self-pay | Admitting: Dermatology

## 2013-03-04 ENCOUNTER — Ambulatory Visit (HOSPITAL_COMMUNITY): Payer: Medicare FFS | Admitting: *Deleted

## 2013-03-10 ENCOUNTER — Telehealth (HOSPITAL_COMMUNITY): Payer: Self-pay

## 2013-03-10 ENCOUNTER — Ambulatory Visit (HOSPITAL_COMMUNITY): Payer: Medicare FFS | Admitting: Physical Therapy

## 2013-03-25 ENCOUNTER — Ambulatory Visit (HOSPITAL_COMMUNITY)
Admission: RE | Admit: 2013-03-25 | Discharge: 2013-03-25 | Disposition: A | Discharge status: Home / Self Care | Payer: Medicare FFS | Source: Ambulatory Visit | Attending: Family Medicine | Admitting: Family Medicine

## 2013-03-25 DIAGNOSIS — M62838 Other muscle spasm: Secondary | ICD-10-CM

## 2013-03-25 DIAGNOSIS — M25639 Stiffness of unspecified wrist, not elsewhere classified: Secondary | ICD-10-CM | POA: Insufficient documentation

## 2013-03-25 DIAGNOSIS — M2560 Stiffness of unspecified joint, not elsewhere classified: Secondary | ICD-10-CM

## 2013-03-25 DIAGNOSIS — M542 Cervicalgia: Secondary | ICD-10-CM | POA: Insufficient documentation

## 2013-03-25 DIAGNOSIS — M5380 Other specified dorsopathies, site unspecified: Secondary | ICD-10-CM | POA: Insufficient documentation

## 2013-03-25 DIAGNOSIS — M546 Pain in thoracic spine: Secondary | ICD-10-CM | POA: Insufficient documentation

## 2013-03-25 DIAGNOSIS — IMO0001 Reserved for inherently not codable concepts without codable children: Secondary | ICD-10-CM | POA: Insufficient documentation

## 2013-03-25 DIAGNOSIS — I10 Essential (primary) hypertension: Secondary | ICD-10-CM | POA: Insufficient documentation

## 2013-03-25 DIAGNOSIS — M25539 Pain in unspecified wrist: Secondary | ICD-10-CM | POA: Insufficient documentation
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
## 2013-06-03 NOTE — Addendum Note (Signed)
Encounter addended by: Debby Bud, OT on: 06/03/2013 10:46 AM<BR>     Documentation filed: Episodes

## 2013-09-17 ENCOUNTER — Other Ambulatory Visit (HOSPITAL_COMMUNITY): Payer: Self-pay | Admitting: Family Medicine

## 2013-09-17 DIAGNOSIS — M949 Disorder of cartilage, unspecified: Principal | ICD-10-CM

## 2013-09-17 DIAGNOSIS — M899 Disorder of bone, unspecified: Secondary | ICD-10-CM

## 2013-09-23 ENCOUNTER — Ambulatory Visit (HOSPITAL_COMMUNITY)
Admission: RE | Admit: 2013-09-23 | Discharge: 2013-09-23 | Disposition: A | Discharge status: Home / Self Care | Payer: Medicare FFS | Source: Ambulatory Visit | Attending: Family Medicine | Admitting: Family Medicine

## 2013-09-23 DIAGNOSIS — M899 Disorder of bone, unspecified: Secondary | ICD-10-CM | POA: Insufficient documentation

## 2013-09-23 DIAGNOSIS — M949 Disorder of cartilage, unspecified: Principal | ICD-10-CM

## 2014-01-07 ENCOUNTER — Other Ambulatory Visit (HOSPITAL_COMMUNITY): Payer: Self-pay | Admitting: Family Medicine

## 2014-01-07 DIAGNOSIS — Z1231 Encounter for screening mammogram for malignant neoplasm of breast: Secondary | ICD-10-CM

## 2014-01-26 ENCOUNTER — Other Ambulatory Visit (HOSPITAL_COMMUNITY): Payer: Self-pay | Admitting: Endocrinology

## 2014-01-26 DIAGNOSIS — E049 Nontoxic goiter, unspecified: Secondary | ICD-10-CM

## 2014-01-30 ENCOUNTER — Ambulatory Visit (HOSPITAL_COMMUNITY): Payer: Medicare FFS

## 2014-02-03 DIAGNOSIS — E039 Hypothyroidism, unspecified: Secondary | ICD-10-CM | POA: Diagnosis not present

## 2014-02-03 DIAGNOSIS — R7301 Impaired fasting glucose: Secondary | ICD-10-CM | POA: Diagnosis not present

## 2014-02-05 ENCOUNTER — Ambulatory Visit (HOSPITAL_COMMUNITY)
Admission: RE | Admit: 2014-02-05 | Discharge: 2014-02-05 | Disposition: A | Discharge status: Home / Self Care | Payer: Medicare Other | Source: Ambulatory Visit | Attending: Family Medicine | Admitting: Family Medicine

## 2014-02-05 ENCOUNTER — Ambulatory Visit (HOSPITAL_COMMUNITY)
Admission: RE | Admit: 2014-02-05 | Discharge: 2014-02-05 | Disposition: A | Discharge status: Home / Self Care | Payer: Medicare Other | Source: Ambulatory Visit | Attending: Endocrinology | Admitting: Endocrinology

## 2014-02-05 DIAGNOSIS — Z1231 Encounter for screening mammogram for malignant neoplasm of breast: Secondary | ICD-10-CM

## 2014-02-05 DIAGNOSIS — E042 Nontoxic multinodular goiter: Secondary | ICD-10-CM | POA: Diagnosis not present

## 2014-02-05 DIAGNOSIS — E049 Nontoxic goiter, unspecified: Secondary | ICD-10-CM

## 2014-02-10 DIAGNOSIS — E049 Nontoxic goiter, unspecified: Secondary | ICD-10-CM | POA: Diagnosis not present

## 2014-02-10 DIAGNOSIS — I10 Essential (primary) hypertension: Secondary | ICD-10-CM | POA: Diagnosis not present

## 2014-02-10 DIAGNOSIS — R7301 Impaired fasting glucose: Secondary | ICD-10-CM | POA: Diagnosis not present

## 2014-02-10 DIAGNOSIS — E039 Hypothyroidism, unspecified: Secondary | ICD-10-CM | POA: Diagnosis not present

## 2014-03-09 DIAGNOSIS — E663 Overweight: Secondary | ICD-10-CM | POA: Diagnosis not present

## 2014-03-09 DIAGNOSIS — N342 Other urethritis: Secondary | ICD-10-CM | POA: Diagnosis not present

## 2014-03-09 DIAGNOSIS — Z6826 Body mass index (BMI) 26.0-26.9, adult: Secondary | ICD-10-CM | POA: Diagnosis not present

## 2014-03-09 DIAGNOSIS — N390 Urinary tract infection, site not specified: Secondary | ICD-10-CM | POA: Diagnosis not present

## 2014-04-21 ENCOUNTER — Other Ambulatory Visit (HOSPITAL_COMMUNITY): Payer: Self-pay | Admitting: Family Medicine

## 2014-04-21 DIAGNOSIS — E663 Overweight: Secondary | ICD-10-CM | POA: Diagnosis not present

## 2014-04-21 DIAGNOSIS — E782 Mixed hyperlipidemia: Secondary | ICD-10-CM | POA: Diagnosis not present

## 2014-04-21 DIAGNOSIS — Z6826 Body mass index (BMI) 26.0-26.9, adult: Secondary | ICD-10-CM | POA: Diagnosis not present

## 2014-04-21 DIAGNOSIS — Z0000 Encounter for general adult medical examination without abnormal findings: Secondary | ICD-10-CM | POA: Diagnosis not present

## 2014-04-21 DIAGNOSIS — R0989 Other specified symptoms and signs involving the circulatory and respiratory systems: Secondary | ICD-10-CM

## 2014-04-24 ENCOUNTER — Ambulatory Visit (HOSPITAL_COMMUNITY)
Admission: RE | Admit: 2014-04-24 | Discharge: 2014-04-24 | Disposition: A | Discharge status: Home / Self Care | Payer: Medicare Other | Source: Ambulatory Visit | Attending: Family Medicine | Admitting: Family Medicine

## 2014-04-24 DIAGNOSIS — I6523 Occlusion and stenosis of bilateral carotid arteries: Secondary | ICD-10-CM | POA: Diagnosis not present

## 2014-04-24 DIAGNOSIS — R0989 Other specified symptoms and signs involving the circulatory and respiratory systems: Secondary | ICD-10-CM | POA: Insufficient documentation

## 2014-06-16 DIAGNOSIS — E663 Overweight: Secondary | ICD-10-CM | POA: Diagnosis not present

## 2014-06-16 DIAGNOSIS — Z6826 Body mass index (BMI) 26.0-26.9, adult: Secondary | ICD-10-CM | POA: Diagnosis not present

## 2014-06-16 DIAGNOSIS — Z0289 Encounter for other administrative examinations: Secondary | ICD-10-CM | POA: Diagnosis not present

## 2014-08-21 DIAGNOSIS — Z6825 Body mass index (BMI) 25.0-25.9, adult: Secondary | ICD-10-CM | POA: Diagnosis not present

## 2014-08-21 DIAGNOSIS — R350 Frequency of micturition: Secondary | ICD-10-CM | POA: Diagnosis not present

## 2014-08-21 DIAGNOSIS — E663 Overweight: Secondary | ICD-10-CM | POA: Diagnosis not present

## 2014-08-21 DIAGNOSIS — N342 Other urethritis: Secondary | ICD-10-CM | POA: Diagnosis not present

## 2014-08-21 DIAGNOSIS — Z1389 Encounter for screening for other disorder: Secondary | ICD-10-CM | POA: Diagnosis not present

## 2014-09-07 DIAGNOSIS — R3919 Other difficulties with micturition: Secondary | ICD-10-CM | POA: Diagnosis not present

## 2014-09-07 DIAGNOSIS — N342 Other urethritis: Secondary | ICD-10-CM | POA: Diagnosis not present

## 2014-09-07 DIAGNOSIS — E663 Overweight: Secondary | ICD-10-CM | POA: Diagnosis not present

## 2014-09-07 DIAGNOSIS — Z1389 Encounter for screening for other disorder: Secondary | ICD-10-CM | POA: Diagnosis not present

## 2014-09-07 DIAGNOSIS — Z6826 Body mass index (BMI) 26.0-26.9, adult: Secondary | ICD-10-CM | POA: Diagnosis not present

## 2014-10-26 DIAGNOSIS — I10 Essential (primary) hypertension: Secondary | ICD-10-CM | POA: Diagnosis not present

## 2014-10-26 DIAGNOSIS — Z1389 Encounter for screening for other disorder: Secondary | ICD-10-CM | POA: Diagnosis not present

## 2014-10-26 DIAGNOSIS — Z23 Encounter for immunization: Secondary | ICD-10-CM | POA: Diagnosis not present

## 2014-10-26 DIAGNOSIS — R7309 Other abnormal glucose: Secondary | ICD-10-CM | POA: Diagnosis not present

## 2014-10-26 DIAGNOSIS — Z6826 Body mass index (BMI) 26.0-26.9, adult: Secondary | ICD-10-CM | POA: Diagnosis not present

## 2014-10-26 DIAGNOSIS — E782 Mixed hyperlipidemia: Secondary | ICD-10-CM | POA: Diagnosis not present

## 2014-10-26 DIAGNOSIS — E039 Hypothyroidism, unspecified: Secondary | ICD-10-CM | POA: Diagnosis not present

## 2014-12-22 DIAGNOSIS — J029 Acute pharyngitis, unspecified: Secondary | ICD-10-CM | POA: Diagnosis not present

## 2014-12-22 DIAGNOSIS — E663 Overweight: Secondary | ICD-10-CM | POA: Diagnosis not present

## 2014-12-22 DIAGNOSIS — Z6826 Body mass index (BMI) 26.0-26.9, adult: Secondary | ICD-10-CM | POA: Diagnosis not present

## 2014-12-22 DIAGNOSIS — Z1389 Encounter for screening for other disorder: Secondary | ICD-10-CM | POA: Diagnosis not present

## 2014-12-22 DIAGNOSIS — B349 Viral infection, unspecified: Secondary | ICD-10-CM | POA: Diagnosis not present

## 2015-01-06 ENCOUNTER — Other Ambulatory Visit (HOSPITAL_COMMUNITY): Payer: Self-pay | Admitting: Endocrinology

## 2015-01-06 DIAGNOSIS — E049 Nontoxic goiter, unspecified: Secondary | ICD-10-CM

## 2015-01-19 ENCOUNTER — Other Ambulatory Visit (HOSPITAL_COMMUNITY): Payer: Self-pay | Admitting: Family Medicine

## 2015-01-19 DIAGNOSIS — Z1231 Encounter for screening mammogram for malignant neoplasm of breast: Secondary | ICD-10-CM

## 2015-02-08 ENCOUNTER — Ambulatory Visit (HOSPITAL_COMMUNITY)
Admission: RE | Admit: 2015-02-08 | Discharge: 2015-02-08 | Disposition: A | Discharge status: Home / Self Care | Payer: Medicare HMO | Source: Ambulatory Visit | Attending: Family Medicine | Admitting: Family Medicine

## 2015-02-08 DIAGNOSIS — Z1231 Encounter for screening mammogram for malignant neoplasm of breast: Secondary | ICD-10-CM | POA: Diagnosis not present

## 2015-02-11 ENCOUNTER — Ambulatory Visit (HOSPITAL_COMMUNITY)
Admission: RE | Admit: 2015-02-11 | Discharge: 2015-02-11 | Disposition: A | Discharge status: Home / Self Care | Payer: Medicare HMO | Source: Ambulatory Visit | Attending: Endocrinology | Admitting: Endocrinology

## 2015-02-11 ENCOUNTER — Other Ambulatory Visit (HOSPITAL_COMMUNITY): Payer: Self-pay | Admitting: Family Medicine

## 2015-02-11 ENCOUNTER — Other Ambulatory Visit: Payer: Self-pay | Admitting: Family Medicine

## 2015-02-11 DIAGNOSIS — R599 Enlarged lymph nodes, unspecified: Secondary | ICD-10-CM | POA: Insufficient documentation

## 2015-02-11 DIAGNOSIS — E049 Nontoxic goiter, unspecified: Secondary | ICD-10-CM | POA: Diagnosis present

## 2015-02-11 DIAGNOSIS — E010 Iodine-deficiency related diffuse (endemic) goiter: Secondary | ICD-10-CM | POA: Insufficient documentation

## 2015-02-11 DIAGNOSIS — R928 Other abnormal and inconclusive findings on diagnostic imaging of breast: Secondary | ICD-10-CM

## 2015-02-11 DIAGNOSIS — E041 Nontoxic single thyroid nodule: Secondary | ICD-10-CM | POA: Insufficient documentation

## 2015-02-16 ENCOUNTER — Ambulatory Visit (HOSPITAL_COMMUNITY)
Admission: RE | Admit: 2015-02-16 | Discharge: 2015-02-16 | Disposition: A | Discharge status: Home / Self Care | Payer: Medicare HMO | Source: Ambulatory Visit | Attending: Family Medicine | Admitting: Family Medicine

## 2015-02-16 DIAGNOSIS — R928 Other abnormal and inconclusive findings on diagnostic imaging of breast: Secondary | ICD-10-CM | POA: Insufficient documentation

## 2015-03-02 ENCOUNTER — Encounter (HOSPITAL_COMMUNITY): Payer: Medicare HMO

## 2015-05-25 ENCOUNTER — Encounter: Payer: Self-pay | Admitting: Rheumatology

## 2015-07-26 ENCOUNTER — Other Ambulatory Visit (HOSPITAL_COMMUNITY): Payer: Self-pay | Admitting: Rheumatology

## 2015-07-26 ENCOUNTER — Ambulatory Visit (HOSPITAL_COMMUNITY)
Admission: RE | Admit: 2015-07-26 | Discharge: 2015-07-26 | Disposition: A | Discharge status: Home / Self Care | Payer: Medicare HMO | Source: Ambulatory Visit | Attending: Rheumatology | Admitting: Rheumatology

## 2015-07-26 DIAGNOSIS — T451X5A Adverse effect of antineoplastic and immunosuppressive drugs, initial encounter: Secondary | ICD-10-CM

## 2015-07-26 DIAGNOSIS — Z01812 Encounter for preprocedural laboratory examination: Secondary | ICD-10-CM | POA: Diagnosis not present

## 2015-10-13 ENCOUNTER — Ambulatory Visit (INDEPENDENT_AMBULATORY_CARE_PROVIDER_SITE_OTHER): Payer: Medicare HMO | Admitting: Rheumatology

## 2015-10-13 ENCOUNTER — Ambulatory Visit (INDEPENDENT_AMBULATORY_CARE_PROVIDER_SITE_OTHER): Payer: Self-pay | Admitting: Rheumatology

## 2015-10-13 DIAGNOSIS — Z09 Encounter for follow-up examination after completed treatment for conditions other than malignant neoplasm: Secondary | ICD-10-CM

## 2015-10-13 DIAGNOSIS — M25531 Pain in right wrist: Secondary | ICD-10-CM | POA: Diagnosis not present

## 2015-10-13 DIAGNOSIS — M064 Inflammatory polyarthropathy: Secondary | ICD-10-CM

## 2015-10-13 DIAGNOSIS — N189 Chronic kidney disease, unspecified: Secondary | ICD-10-CM

## 2015-10-13 DIAGNOSIS — M79641 Pain in right hand: Secondary | ICD-10-CM

## 2015-11-26 ENCOUNTER — Telehealth: Payer: Self-pay | Admitting: Pharmacist

## 2015-11-26 NOTE — Telephone Encounter (Signed)
Received phone call from patient.  Past was last seen on 10/13/15 and at that time she was having a flare of her inflammatory arthritis while on methotrexate and hydroxychloroquine.  Patient was prescribed a steroid taper and we had discussed starting Humira.  I provided patient with information on the patient assistance program and she was going to apply to see if she qualified for assistance.    Patient called today stating that she did not qualify for the Humira patient assistance program.  She states she feels much better after completing the steroid taper, and she wants to defer initiation of Humira at this time.  Noticed that patient does not have a follow up appointment on file.  Follow up plan as 2-3 months after 10/13/15 visit.  Transferred patient to the front desk and advised her to schedule a follow up appointment for 1-2 months from now.     Elisabeth Most, Pharm.D., BCPS Clinical Pharmacist Pager: [phone] Phone: [phone] 11/26/2015 2:33 PM

## 2016-01-07 ENCOUNTER — Other Ambulatory Visit (HOSPITAL_COMMUNITY): Payer: Self-pay | Admitting: Endocrinology

## 2016-01-07 DIAGNOSIS — E049 Nontoxic goiter, unspecified: Secondary | ICD-10-CM

## 2016-01-19 ENCOUNTER — Ambulatory Visit (INDEPENDENT_AMBULATORY_CARE_PROVIDER_SITE_OTHER): Payer: Medicare Other | Admitting: Rheumatology

## 2016-01-19 ENCOUNTER — Encounter: Payer: Self-pay | Admitting: Rheumatology

## 2016-01-19 VITALS — BP 141/77 | HR 64 | Resp 15 | Wt 165.0 lb

## 2016-01-19 DIAGNOSIS — Z79899 Other long term (current) drug therapy: Secondary | ICD-10-CM

## 2016-01-19 LAB — CBC WITH DIFFERENTIAL/PLATELET
Basophils Absolute: 59 cells/uL (ref 0–200)
Basophils Relative: 1 %
Eosinophils Absolute: 118 cells/uL (ref 15–500)
Eosinophils Relative: 2 %
HEMATOCRIT: 37.3 % (ref 35.0–45.0)
HEMOGLOBIN: 12.2 g/dL (ref 11.7–15.5)
LYMPHS ABS: 1770 {cells}/uL (ref 850–3900)
Lymphocytes Relative: 30 %
MCH: 28.4 pg (ref 27.0–33.0)
MCHC: 32.7 g/dL (ref 32.0–36.0)
MCV: 86.7 fL (ref 80.0–100.0)
MONO ABS: 649 {cells}/uL (ref 200–950)
MPV: 9.4 fL (ref 7.5–12.5)
Monocytes Relative: 11 %
NEUTROS ABS: 3304 {cells}/uL (ref 1500–7800)
NEUTROS PCT: 56 %
Platelets: 303 10*3/uL (ref 140–400)
RBC: 4.3 MIL/uL (ref 3.80–5.10)
RDW: 15.5 % — ABNORMAL HIGH (ref 11.0–15.0)
WBC: 5.9 10*3/uL (ref 3.8–10.8)

## 2016-01-19 LAB — COMPLETE METABOLIC PANEL WITH GFR
ALBUMIN: 4.2 g/dL (ref 3.6–5.1)
ALK PHOS: 69 U/L (ref 33–130)
ALT: 17 U/L (ref 6–29)
AST: 23 U/L (ref 10–35)
BUN: 18 mg/dL (ref 7–25)
CALCIUM: 9.1 mg/dL (ref 8.6–10.4)
CHLORIDE: 101 mmol/L (ref 98–110)
CO2: 27 mmol/L (ref 20–31)
Creat: 0.83 mg/dL (ref 0.60–0.88)
GFR, EST NON AFRICAN AMERICAN: 66 mL/min (ref >=60)
GFR, Est African American: 76 mL/min (ref >=60)
GLUCOSE: 69 mg/dL (ref 65–99)
POTASSIUM: 4.4 mmol/L (ref 3.5–5.3)
SODIUM: 136 mmol/L (ref 135–146)
Total Bilirubin: 0.5 mg/dL (ref 0.2–1.2)
Total Protein: 7.4 g/dL (ref 6.1–8.1)

## 2016-01-19 LAB — HIV ANTIBODY (ROUTINE TESTING W REFLEX): HIV: NONREACTIVE

## 2016-01-19 NOTE — Patient Instructions (Addendum)
Take leflunomide 10 mg daily for two weeks then increase to 20 mg daily.  Stop methotrexate.    Standing Labs We placed an order today for your standing lab work.    Please come back and get your standing labs every 2 weeks times 2, then every 2 months.    We have open lab Monday through Friday from 8:30-11:30 AM and 1:30-4 PM at the office of Dr. Tresa Moore, PA.   The office is located at [address] [place], [place], [place] [address] No appointment is necessary.   Labs are drawn by [facility].  You may receive a bill from [facility] for your lab work.     Leflunomide tablets What is this medicine? LEFLUNOMIDE (le FLOO na mide) is for rheumatoid arthritis. This medicine may be used for other purposes; ask your health care provider or pharmacist if you have questions. COMMON BRAND NAME(S): Arava What should I tell my health care provider before I take this medicine? They need to know if you have any of these conditions: -alcoholism -bone marrow problems -fever or infection -immune system problems -kidney disease -liver disease -an unusual or allergic reaction to leflunomide, teriflunomide, other medicines, lactose, foods, dyes, or preservatives -pregnant or trying to get pregnant -breast-feeding How should I use this medicine? Take this medicine by mouth with a full glass of water. Follow the directions on the prescription label. Take your medicine at regular intervals. Do not take your medicine more often than directed. Do not stop taking except on your doctor's advice. Talk to your pediatrician regarding the use of this medicine in children. Special care may be needed. Overdosage: If you think you have taken too much of this medicine contact a poison control center or emergency room at once. NOTE: This medicine is only for you. Do not share this medicine with others. What if I miss a dose? If you miss a dose, take it as soon as you can. If it is almost time  for your next dose, take only that dose. Do not take double or extra doses. What may interact with this medicine? Do not take this medicine with any of the following medications: -teriflunomide This medicine may also interact with the following medications: -charcoal -cholestyramine -methotrexate -NSAIDs, medicines for pain and inflammation, like ibuprofen or naproxen -phenytoin -rifampin -tolbutamide -vaccines -warfarin This list may not describe all possible interactions. Give your health care provider a list of all the medicines, herbs, non-prescription drugs, or dietary supplements you use. Also tell them if you smoke, drink alcohol, or use illegal drugs. Some items may interact with your medicine. What should I watch for while using this medicine? Visit your doctor or health care professional for regular checks on your progress. You will need frequent blood checks while you are receiving the medicine. If you get a cold or other infection while receiving this medicine, call your doctor or health care professional. Do not treat yourself. The medicine may increase your risk of getting an infection. If you are a woman who has the potential to become pregnant, discuss birth control options with your doctor or health care professional. Dennis Bast must not be pregnant, and you must be using a reliable form of birth control. The medicine may harm an unborn baby. Immediately call your doctor if you think you might be pregnant. Alcoholic drinks may increase possible damage to your liver. Do not drink alcohol while taking this medicine. What side effects may I notice from receiving this medicine? Side  effects that you should report to your doctor or health care professional as soon as possible: -allergic reactions like skin rash, itching or hives, swelling of the face, lips, or tongue -cough -difficulty breathing or shortness of breath -fever, chills or any other sign of infection -redness, blistering,  peeling or loosening of the skin, including inside the mouth -unusual bleeding or bruising -unusually weak or tired -vomiting -yellowing of eyes or skin Side effects that usually do not require medical attention (report to your doctor or health care professional if they continue or are bothersome): -diarrhea -hair loss -headache -nausea This list may not describe all possible side effects. Call your doctor for medical advice about side effects. You may report side effects to FDA at 1-800-FDA-1088. Where should I keep my medicine? Keep out of the reach of children. Store at room temperature between 15 and 30 degrees C (59 and 86 degrees F). Protect from moisture and light. Throw away any unused medicine after the expiration date. NOTE: This sheet is a summary. It may not cover all possible information. If you have questions about this medicine, talk to your doctor, pharmacist, or health care provider.  2017 Elsevier/Gold Standard (2012-12-24 10:53:11)
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
## 2016-01-19 NOTE — Progress Notes (Signed)
Pharmacy Note  Subjective: Patient presents today to the [facility] to see Mr. [name].  She is currently taking hydroxychloroquine 200 mg twice daily and methotrexate 12.5 mg weekly.  Patient reports she has not been taking folic acid.  I reviewed with patient the importance of taking folic acid while on methotrexate and advised that patient restart folic acid.  Patient voiced understanding.  Plan per 10/13/15 office visit was to initiate Humira and we gave information on the Humira Patient assistance program.  Patient called on 11/26/15 reporting she did not qualify for the Humira patient assistance program.  Today patient reports she never got the official denial from the Humira program but reports she decided that she did not want to initiate therapy.  Detailed discussion was had regarding Humira.  Patient agreed to initiation.  Phone call was made to Humira patient assistance program.  Confirmed that patient did not qualify for Humira patient assistance program due to income.  Discussed copay cost of Humira.  Patient reports she does not know if she will be able to afford the medication due to expensive copay.  Unfortunately patient does not qualify for the patient assistance program and there are no grant foundations available at this time to assist with copay cost.    Patient seen by the pharmacist for counseling on leflunomide Jolee Ewing).    Objective: 10/13/15: CBC normal and CMP normal TB Gold: negative (05/25/15) Pregnancy status:  Post-menopausal   Vitals:   01/19/16 1117  BP: (!) 141/77  Pulse: 64  Resp: 15    Assessment/Plan: Patient is being initiated on leflunomide (Arava) 10 mg daily for 2 weeks then 20 mg daily.  Patient was counseled on the purpose, proper use, and adverse effects of leflunomide including risk of infection, nausea/diarrhea/weight loss, increase in blood pressure, rash, hair loss, tingling in the hands and feet, and signs and symptoms of interstitial  lung disease.  Discussed the importance of frequent monitoring of liver function and blood counts, and patient was provided with instructions for standing labs.  Discussed importance of birth control while on leflunomide due to risk of congenital abnormalities, and patient confirms she is in menopause.  Provided patient with educational materials on leflunomide and answered all questions.  Patient consented to [place] use, and consent will be uploaded into the media tab.  Patient was advised to discontinue methotrexate.    Elisabeth Most, Pharm.D., BCPS Clinical Pharmacist Pager: [phone] Phone: [phone] 01/19/2016 12:55 PM

## 2016-01-20 ENCOUNTER — Telehealth: Payer: Self-pay | Admitting: *Deleted

## 2016-01-20 MED ORDER — LEFLUNOMIDE 20 MG PO TABS: 20.0000 mg | ORAL_TABLET | Freq: Every day | ORAL | 2 refills | Status: DC

## 2016-01-20 MED ORDER — LEFLUNOMIDE 10 MG PO TABS: 10.0000 mg | ORAL_TABLET | Freq: Every day | ORAL | 0 refills | Status: DC

## 2016-01-20 NOTE — Progress Notes (Signed)
Tell patient:#1: HIV test is negative#2: CBC with differential and CMP with GFR are within normal limits.#3: Patient was failing her methotrexate and Plaquenil and we had a considered putting her on biologic. Unfortunately due to cost factor/poor insurance coverage we have to abandon that option. It is unaffordable for the patientInstead, we will switch the patient Erin Schmidt. The prescriptions are given written and discussed with the patient.Tell patient her labs are all normal

## 2016-01-20 NOTE — Telephone Encounter (Signed)
Reviewed test results with patient. She states her prescriptions has not been sent to the pharmacy. Prescription has been sent to the office.

## 2016-01-20 NOTE — Telephone Encounter (Signed)
-----   Message from [place], [place] sent at 01/20/2016  1:14 PM EST ----- Tell patient:#1: HIV test is negative#2: CBC with differential and CMP with GFR are within normal limits.#3: Patient was failing her methotrexate and Plaquenil and we had a considered putting her on biologic. Unfortunately due to cost factor/poor [employer] coverage we have to abandon that option. It is unaffordable for the patientInstead, we will switch the patient Erin Schmidt. The prescriptions are given written and discussed with the patient.Tell patient her labs are all normal

## 2016-01-21 ENCOUNTER — Other Ambulatory Visit (HOSPITAL_COMMUNITY): Payer: Self-pay | Admitting: Family Medicine

## 2016-01-21 DIAGNOSIS — Z1231 Encounter for screening mammogram for malignant neoplasm of breast: Secondary | ICD-10-CM

## 2016-02-08 ENCOUNTER — Ambulatory Visit (HOSPITAL_COMMUNITY)
Admission: RE | Admit: 2016-02-08 | Discharge: 2016-02-08 | Disposition: A | Discharge status: Home / Self Care | Payer: Medicare Other | Source: Ambulatory Visit | Attending: Endocrinology | Admitting: Endocrinology

## 2016-02-08 DIAGNOSIS — E039 Hypothyroidism, unspecified: Secondary | ICD-10-CM | POA: Diagnosis not present

## 2016-02-08 DIAGNOSIS — E042 Nontoxic multinodular goiter: Secondary | ICD-10-CM | POA: Diagnosis not present

## 2016-02-08 DIAGNOSIS — R7301 Impaired fasting glucose: Secondary | ICD-10-CM | POA: Diagnosis not present

## 2016-02-08 DIAGNOSIS — E049 Nontoxic goiter, unspecified: Secondary | ICD-10-CM

## 2016-02-15 DIAGNOSIS — E039 Hypothyroidism, unspecified: Secondary | ICD-10-CM | POA: Diagnosis not present

## 2016-02-15 DIAGNOSIS — I10 Essential (primary) hypertension: Secondary | ICD-10-CM | POA: Diagnosis not present

## 2016-02-15 DIAGNOSIS — R7301 Impaired fasting glucose: Secondary | ICD-10-CM | POA: Diagnosis not present

## 2016-02-15 DIAGNOSIS — E049 Nontoxic goiter, unspecified: Secondary | ICD-10-CM | POA: Diagnosis not present

## 2016-02-16 ENCOUNTER — Ambulatory Visit (HOSPITAL_COMMUNITY)
Admission: RE | Admit: 2016-02-16 | Discharge: 2016-02-16 | Disposition: A | Discharge status: Home / Self Care | Payer: Medicare Other | Source: Ambulatory Visit | Attending: Family Medicine | Admitting: Family Medicine

## 2016-02-16 ENCOUNTER — Encounter (HOSPITAL_COMMUNITY): Payer: Self-pay

## 2016-02-16 DIAGNOSIS — Z1231 Encounter for screening mammogram for malignant neoplasm of breast: Secondary | ICD-10-CM | POA: Diagnosis not present

## 2016-02-17 ENCOUNTER — Ambulatory Visit (HOSPITAL_COMMUNITY): Payer: Medicare HMO

## 2016-03-02 ENCOUNTER — Other Ambulatory Visit: Payer: Self-pay

## 2016-03-02 ENCOUNTER — Telehealth: Payer: Self-pay | Admitting: *Deleted

## 2016-03-02 DIAGNOSIS — Z79899 Other long term (current) drug therapy: Secondary | ICD-10-CM | POA: Diagnosis not present

## 2016-03-02 LAB — CBC WITH DIFFERENTIAL/PLATELET
BASOS PCT: 1 %
Basophils Absolute: 55 cells/uL (ref 0–200)
EOS PCT: 2 %
Eosinophils Absolute: 110 cells/uL (ref 15–500)
HCT: 36.5 % (ref 35.0–45.0)
HEMOGLOBIN: 12 g/dL (ref 11.7–15.5)
LYMPHS ABS: 1540 {cells}/uL (ref 850–3900)
LYMPHS PCT: 28 %
MCH: 28.1 pg (ref 27.0–33.0)
MCHC: 32.9 g/dL (ref 32.0–36.0)
MCV: 85.5 fL (ref 80.0–100.0)
MONO ABS: 660 {cells}/uL (ref 200–950)
MPV: 9.2 fL (ref 7.5–12.5)
Monocytes Relative: 12 %
Neutro Abs: 3135 cells/uL (ref 1500–7800)
Neutrophils Relative %: 57 %
Platelets: 242 10*3/uL (ref 140–400)
RBC: 4.27 MIL/uL (ref 3.80–5.10)
RDW: 14.9 % (ref 11.0–15.0)
WBC: 5.5 10*3/uL (ref 3.8–10.8)

## 2016-03-02 LAB — COMPLETE METABOLIC PANEL WITH GFR
ALT: 17 U/L (ref 6–29)
AST: 24 U/L (ref 10–35)
Albumin: 3.9 g/dL (ref 3.6–5.1)
Alkaline Phosphatase: 79 U/L (ref 33–130)
BUN: 14 mg/dL (ref 7–25)
CALCIUM: 9 mg/dL (ref 8.6–10.4)
CHLORIDE: 103 mmol/L (ref 98–110)
CO2: 28 mmol/L (ref 20–31)
CREATININE: 0.87 mg/dL (ref 0.60–0.88)
GFR, Est African American: 72 mL/min (ref >=60)
GFR, Est Non African American: 63 mL/min (ref >=60)
GLUCOSE: 90 mg/dL (ref 65–99)
POTASSIUM: 3.9 mmol/L (ref 3.5–5.3)
SODIUM: 140 mmol/L (ref 135–146)
Total Bilirubin: 0.4 mg/dL (ref 0.2–1.2)
Total Protein: 7 g/dL (ref 6.1–8.1)

## 2016-03-02 MED ORDER — PREDNISONE 5 MG PO TABS: ORAL_TABLET | ORAL | 0 refills | Status: DC

## 2016-03-02 NOTE — Telephone Encounter (Signed)
Patient in office today for her lab work. Patient has recently started [place] and states she has been having increased pain in her fingers, hands, wrist and feet. Patient states she has been taking some tylenol with little relief.

## 2016-03-02 NOTE — Telephone Encounter (Signed)
I saw the patient and we spoke as she was leaving the office.She is having a flare and it is okay to give her the following medication.   4po qAM x 4 days;3po qAM x 4 days;2po qAM x 4 days;1po qAM x 4 days;1/2po qAM x 4 days; then stop.;disp 42 pills w/ no refills.;

## 2016-03-02 NOTE — Telephone Encounter (Signed)
Sent prescription to the pharmacy and left message to advise patient

## 2016-03-03 NOTE — Progress Notes (Signed)
Labs are normal.

## 2016-03-08 DIAGNOSIS — M0609 Rheumatoid arthritis without rheumatoid factor, multiple sites: Secondary | ICD-10-CM | POA: Insufficient documentation

## 2016-03-08 DIAGNOSIS — Z79899 Other long term (current) drug therapy: Secondary | ICD-10-CM | POA: Insufficient documentation
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
## 2016-03-12 ENCOUNTER — Other Ambulatory Visit: Payer: Self-pay | Admitting: Rheumatology

## 2016-03-13 NOTE — Telephone Encounter (Signed)
Last Visit: 10/13/15 Next Visit: 03/17/16 Labs: 03/02/16 WNL PLQ Eye Exam: 06/09/15 WNL  Okay to refill PLQ?

## 2016-03-13 NOTE — Telephone Encounter (Signed)
Patient saw [place] October 2017.She was having inadequate response to methotrexate and plq; we offered humira. I don't see where she started humira.  We can refill plq if pt wants. Ask her that we have an office visit this Friday and discuss in detail at office visit.  I am refilling plq if she needs that.

## 2016-03-17 ENCOUNTER — Encounter: Payer: Self-pay | Admitting: Rheumatology

## 2016-03-17 ENCOUNTER — Ambulatory Visit (INDEPENDENT_AMBULATORY_CARE_PROVIDER_SITE_OTHER): Payer: Medicare Other | Admitting: Rheumatology

## 2016-03-17 ENCOUNTER — Telehealth: Payer: Self-pay | Admitting: Pharmacist

## 2016-03-17 VITALS — BP 167/84 | HR 79 | Resp 13 | Ht 67.0 in | Wt 161.0 lb

## 2016-03-17 DIAGNOSIS — M1990 Unspecified osteoarthritis, unspecified site: Secondary | ICD-10-CM | POA: Diagnosis not present

## 2016-03-17 DIAGNOSIS — M546 Pain in thoracic spine: Secondary | ICD-10-CM | POA: Diagnosis not present

## 2016-03-17 DIAGNOSIS — M2550 Pain in unspecified joint: Secondary | ICD-10-CM | POA: Diagnosis not present

## 2016-03-17 DIAGNOSIS — Z79899 Other long term (current) drug therapy: Secondary | ICD-10-CM

## 2016-03-17 MED ORDER — LEFLUNOMIDE 20 MG PO TABS: 20.0000 mg | ORAL_TABLET | Freq: Every day | ORAL | 0 refills | Status: DC

## 2016-03-17 MED ORDER — HYDROXYCHLOROQUINE SULFATE 200 MG PO TABS: 200.0000 mg | ORAL_TABLET | Freq: Two times a day (BID) | ORAL | 1 refills | Status: AC

## 2016-03-17 NOTE — Patient Instructions (Signed)
Etanercept injection What is this medicine? ETANERCEPT (et a NER sept) is used for the treatment of rheumatoid arthritis in adults and children. The medicine is also used to treat psoriatic arthritis, ankylosing spondylitis, and psoriasis. This medicine may be used for other purposes; ask your health care provider or pharmacist if you have questions. COMMON BRAND NAME(S): Enbrel What should I tell my health care provider before I take this medicine? They need to know if you have any of these conditions: -blood disorders -cancer -congestive heart failure -diabetes -exposure to chickenpox -immune system problems -infection -multiple sclerosis -seizure disorder -tuberculosis, a positive skin test for tuberculosis or have recently been in close contact with someone who has tuberculosis -Wegener's granulomatosis -an unusual or allergic reaction to etanercept, latex, other medicines, foods, dyes, or preservatives -pregnant or trying to get pregnant -breast-feeding How should I use this medicine? The medicine is given by injection under the skin. You will be taught how to prepare and give this medicine. Use exactly as directed. Take your medicine at regular intervals. Do not take your medicine more often than directed. It is important that you put your used needles and syringes in a special sharps container. Do not put them in a trash can. If you do not have a sharps container, call your pharmacist or healthcare provider to get one. A special MedGuide will be given to you by the pharmacist with each prescription and refill. Be sure to read this information carefully each time. Talk to your pediatrician regarding the use of this medicine in children. While this drug may be prescribed for children as young as 4 years of age for selected conditions, precautions do apply. Overdosage: If you think you have taken too much of this medicine contact a poison control center or emergency room at once. NOTE:  This medicine is only for you. Do not share this medicine with others. What if I miss a dose? If you miss a dose, contact your health care professional to find out when you should take your next dose. Do not take double or extra doses without advice. What may interact with this medicine? Do not take this medicine with any of the following medications: -anakinra This medicine may also interact with the following medications: -cyclophosphamide -sulfasalazine -vaccines This list may not describe all possible interactions. Give your health care provider a list of all the medicines, herbs, non-prescription drugs, or dietary supplements you use. Also tell them if you smoke, drink alcohol, or use illegal drugs. Some items may interact with your medicine. What should I watch for while using this medicine? Tell your doctor or healthcare professional if your symptoms do not start to get better or if they get worse. You will be tested for tuberculosis (TB) before you start this medicine. If your doctor prescribes any medicine for TB, you should start taking the TB medicine before starting this medicine. Make sure to finish the full course of TB medicine. Call your doctor or health care professional for advice if you get a fever, chills or sore throat, or other symptoms of a cold or flu. Do not treat yourself. This drug decreases your body's ability to fight infections. Try to avoid being around people who are sick. What side effects may I notice from receiving this medicine? Side effects that you should report to your doctor or health care professional as soon as possible: -allergic reactions like skin rash, itching or hives, swelling of the face, lips, or tongue -changes in vision -fever, chills   or any other sign of infection -numbness or tingling in legs or other parts of the body -red, scaly patches or raised bumps on the skin -shortness of breath or difficulty breathing -swollen lymph nodes in the  neck, underarm, or groin areas -unexplained weight loss -unusual bleeding or bruising -unusual swelling or fluid retention in the legs -unusually weak or tired Side effects that usually do not require medical attention (report to your doctor or health care professional if they continue or are bothersome): -dizziness -headache -nausea -redness, itching, or swelling at the injection site -vomiting This list may not describe all possible side effects. Call your doctor for medical advice about side effects. You may report side effects to FDA at 1-800-FDA-1088. Where should I keep my medicine? Keep out of the reach of children. Store between 2 and 8 degrees C (36 and 46 degrees F). Do not freeze or shake. Protect from light. Throw away any unused medicine after the expiration date. You will be instructed on how to store this medicine. NOTE: This sheet is a summary. It may not cover all possible information. If you have questions about this medicine, talk to your doctor, pharmacist, or health care provider.  2018 Elsevier/Gold Standard (2011-07-03 15:33:36)  

## 2016-03-17 NOTE — Telephone Encounter (Signed)
Spoke to patient and received additional information for the Enbrel patient assistance program.  Application was faxed.  Will update patient once I know status.     Elisabeth Most, Pharm.D., BCPS, CPP Clinical Pharmacist Pager: [phone] Phone: [phone] 03/17/2016 4:31 PM

## 2016-03-17 NOTE — Progress Notes (Signed)
Pharmacy Note  Subjective: Patient presents today to the [facility] to see Mr. Erin Schmidt.  We previously discussed initiation of Humira in this patient; however, she was unable to afford Humira copay and did not qualify for the Humira patient assistance program based on her income.  Noted that Enbrel patient assistance program has a higher income threshold.  Decision was made to try to apply for Enbrel patient assistance program today.  Patient seen by the pharmacist for counseling on Enbrel.    Objective: TB Test: negative (05/25/15) Hepatitis panel: negative (05/25/15)  CBC    Component Value Date/Time   WBC 5.5 03/02/2016 1507   RBC 4.27 03/02/2016 1507   HGB 12.0 03/02/2016 1507   HCT 36.5 03/02/2016 1507   PLT 242 03/02/2016 1507   MCV 85.5 03/02/2016 1507   MCH 28.1 03/02/2016 1507   MCHC 32.9 03/02/2016 1507   RDW 14.9 03/02/2016 1507   LYMPHSABS 1,540 03/02/2016 1507   MONOABS 660 03/02/2016 1507   EOSABS 110 03/02/2016 1507   BASOSABS 55 03/02/2016 1507    Assessment/Plan:  Counseled patient that Enbrel is a TNF blocking agent.  Reviewed Enbrel dose of 50 mg once weekly.  Counseled patient on purpose, proper use, and adverse effects of Enbrel.  Reviewed the most common adverse effects including infections, headache, and injection site reactions. Discussed that there is the possibility of an increased risk of malignancy but it is not well understood if this increased risk is due to the medication or the disease state.  Advised patient to get yearly dermatology exams due to risk of skin cancer.  Reviewed the importance of regular labs while on Enbrel therapy.  Counseled patient that Enbrel should be held prior to scheduled surgery.  Counseled patient to avoid live vaccines while on Enbrel.  Advised patient to get annual influenza vaccine and the pneumococcal vaccine as needed.  Provided patient with medication education material and answered all questions.  Patient  voiced understanding.  Patient consented to Enbrel.  Will upload consent into the media tab.  Reviewed storage instructions for Enbrel.  Will apply for Enbrel patient assistance program.  Application completed during visit today, except patient does not know her income information.  Patient will look up income when she gets home and call the office.  Once I have that information, I will submit application.  Advised patient to contact the office to schedule the first Enbrel shot if Enbrel is approved.  Patient voiced understanding.    Elisabeth Most, Pharm.D., BCPS Clinical Pharmacist Pager: [phone] Phone: [phone] 03/17/2016 12:16 PM

## 2016-03-21 ENCOUNTER — Telehealth: Payer: Self-pay | Admitting: Pharmacist

## 2016-03-21 NOTE — Telephone Encounter (Signed)
Received letter from [employer] Patient [employer] stating patient has been approved for Enbrel through the [employer] from 03/21/16 to 01/08/17.    I called patient and informed her.  Advised she should be getting a call from the [employer] to schedule delivery of her Enbrel.  Reviewed storage instructions for Enbrel.  Also advised initial injection must be administered in the clinic.  Once patient knows delivery date, advised her to call and schedule a nursing visit for her initial injection.  Patient voiced understanding.    Elisabeth Most, Pharm.D., BCPS, CPP Clinical Pharmacist Pager: [phone] Phone: [phone] 03/21/2016 9:52 AM

## 2016-03-22 ENCOUNTER — Telehealth: Payer: Self-pay | Admitting: Pharmacist

## 2016-03-22 NOTE — Telephone Encounter (Signed)
Patient had concerns regarding her blood pressure since starting leflunomide.  She reports it has been running a little higher than normal.  She had one reading yesterday morning with systolic blood pressure of 189 which improved to a systolic blood pressure in the 160s an hour later.  Noted that blood pressure at visit on 01/19/16 prior to initiation was 141/77.  Blood pressure at most recent follow up was 167/84.  Patient has also been on prednisone taper which she confirms is ending today.    I discussed with Dr. Estanislado Pandy.  Plan is to start the patient on Enbrel as soon we the medication is shipped from the patient assistance program (offered patient sample medication to begin earlier; however, patient reports she will wait until she gets the prescription).  Decision was made to discontinue leflunomide.  I discussed this with patient.  Advised patient to monitor her blood pressure and contact her PCP if blood pressure remains elevated.    Elisabeth Most, Pharm.D., BCPS, CPP Clinical Pharmacist Pager: [phone] Phone: [phone] 03/22/2016 8:40 AM

## 2016-03-27 ENCOUNTER — Telehealth: Payer: Self-pay | Admitting: Rheumatology

## 2016-03-27 DIAGNOSIS — I10 Essential (primary) hypertension: Secondary | ICD-10-CM | POA: Diagnosis not present

## 2016-03-27 DIAGNOSIS — E782 Mixed hyperlipidemia: Secondary | ICD-10-CM | POA: Diagnosis not present

## 2016-03-27 DIAGNOSIS — Z1389 Encounter for screening for other disorder: Secondary | ICD-10-CM | POA: Diagnosis not present

## 2016-03-30 ENCOUNTER — Telehealth: Payer: Self-pay | Admitting: Rheumatology

## 2016-03-30 NOTE — Telephone Encounter (Signed)
Patient states she has been having 15-20 "mini stools" per day. Patient states they are not runny diarrhea but are causing her to get irritated and she states she has noticed mucous in the stool . Patient had continued the [place] even after the discussion with Dr. Koleen Nimrod to discontinue it on 03/22/16. Patient advised that she no longer needs to take the [place]. She has an appointment to start Enbrel on 04/03/16.

## 2016-03-30 NOTE — Telephone Encounter (Signed)
Re Arava- patient going to bathroom 20x per day (mini stools)  Please call ASAP and advise

## 2016-03-30 NOTE — Telephone Encounter (Signed)
Ok to [employer]

## 2016-04-03 ENCOUNTER — Ambulatory Visit (INDEPENDENT_AMBULATORY_CARE_PROVIDER_SITE_OTHER): Payer: Medicare Other | Admitting: *Deleted

## 2016-04-03 VITALS — BP 139/66 | HR 75

## 2016-04-03 DIAGNOSIS — M1990 Unspecified osteoarthritis, unspecified site: Secondary | ICD-10-CM

## 2016-04-03 DIAGNOSIS — Z79899 Other long term (current) drug therapy: Secondary | ICD-10-CM | POA: Diagnosis not present

## 2016-04-03 MED ORDER — ETANERCEPT 50 MG/ML ~~LOC~~ SOAJ
50.0000 mg | Freq: Once | SUBCUTANEOUS | Status: AC
  Administered 2016-04-03: 50 mg via SUBCUTANEOUS

## 2016-04-03 NOTE — Progress Notes (Signed)
Pharmacy Note  Subjective:   Patient is being initiated on Enbrel.  Patient was previously counseled extensively on Enbrel on 03/17/16 and consented to initiation of Enbrel at that time.  Patient presents to clinic today to receive the first dose of Enbrel.     Objective: CMP     Component Value Date/Time   NA 140 03/02/2016 1507   K 3.9 03/02/2016 1507   CL 103 03/02/2016 1507   CO2 28 03/02/2016 1507   GLUCOSE 90 03/02/2016 1507   BUN 14 03/02/2016 1507   CREATININE 0.87 03/02/2016 1507   CALCIUM 9.0 03/02/2016 1507   PROT 7.0 03/02/2016 1507   ALBUMIN 3.9 03/02/2016 1507   AST 24 03/02/2016 1507   ALT 17 03/02/2016 1507   ALKPHOS 79 03/02/2016 1507   BILITOT 0.4 03/02/2016 1507   GFRNONAA 63 03/02/2016 1507   GFRAA 72 03/02/2016 1507   CBC    Component Value Date/Time   WBC 5.5 03/02/2016 1507   RBC 4.27 03/02/2016 1507   HGB 12.0 03/02/2016 1507   HCT 36.5 03/02/2016 1507   PLT 242 03/02/2016 1507   MCV 85.5 03/02/2016 1507   MCH 28.1 03/02/2016 1507   MCHC 32.9 03/02/2016 1507   RDW 14.9 03/02/2016 1507   LYMPHSABS 1,540 03/02/2016 1507   MONOABS 660 03/02/2016 1507   EOSABS 110 03/02/2016 1507   BASOSABS 55 03/02/2016 1507   TB Gold: negative (05/25/15)   Assessment/Plan:  Patient was counseled on how to administer subcutaneous Enbrel injection using a demonstration pen.  Enbrel administered by Gwenlyn Perking, LPN.  Lot: [id number], Exp. 01/2018.  Patient was monitored for 30 minutes post injection.  No injection site reaction noted.  Patient will need standing lab orders in one month.  Provided patient with standing lab instructions and placed standing lab order.    Elisabeth Most, Pharm.D., BCPS Clinical Pharmacist Pager: [phone] Phone: [phone] 04/03/2016 9:59 AM

## 2016-04-03 NOTE — Progress Notes (Signed)
Patient in office today for an initial start for Enbrel. Injection was administered to patient's right thigh. Patient tolerated injection well. Patient was monitored in office for 30 minutes after injection for adverse reactions. NO adverse reactions noted.   Administrations This Visit    etanercept (ENBREL) 50 MG/ML injection 50 mg    Admin Date 04/03/2016 Action Given Dose 50 mg Route Subcutaneous Administered By Carole Binning, LPN

## 2016-04-03 NOTE — Patient Instructions (Signed)
Standing Labs We placed an order today for your standing lab work.    Please come back and get your standing labs in 1 month  We have open lab Monday through Friday from 8:30-11:30 AM and 1:30-4 PM at the office of Dr. Shaili Deveshwar/Naitik Panwala, PA.   The office is located at [address], [address], [place], [place] [address] No appointment is necessary.   Labs are drawn by [facility].  You may receive a bill from [facility] for your lab work.     

## 2016-04-14 DIAGNOSIS — E039 Hypothyroidism, unspecified: Secondary | ICD-10-CM | POA: Diagnosis not present

## 2016-04-18 DIAGNOSIS — M059 Rheumatoid arthritis with rheumatoid factor, unspecified: Secondary | ICD-10-CM | POA: Diagnosis not present

## 2016-04-18 DIAGNOSIS — H04123 Dry eye syndrome of bilateral lacrimal glands: Secondary | ICD-10-CM | POA: Diagnosis not present

## 2016-04-18 DIAGNOSIS — Z09 Encounter for follow-up examination after completed treatment for conditions other than malignant neoplasm: Secondary | ICD-10-CM | POA: Diagnosis not present

## 2016-04-18 DIAGNOSIS — Z79899 Other long term (current) drug therapy: Secondary | ICD-10-CM | POA: Diagnosis not present

## 2016-04-21 ENCOUNTER — Other Ambulatory Visit: Payer: Self-pay | Admitting: *Deleted

## 2016-04-21 DIAGNOSIS — Z79899 Other long term (current) drug therapy: Secondary | ICD-10-CM | POA: Diagnosis not present

## 2016-04-21 LAB — COMPLETE METABOLIC PANEL WITH GFR
ALT: 21 U/L (ref 6–29)
AST: 23 U/L (ref 10–35)
Albumin: 3.7 g/dL (ref 3.6–5.1)
Alkaline Phosphatase: 67 U/L (ref 33–130)
BILIRUBIN TOTAL: 0.5 mg/dL (ref 0.2–1.2)
BUN: 11 mg/dL (ref 7–25)
CHLORIDE: 103 mmol/L (ref 98–110)
CO2: 27 mmol/L (ref 20–31)
CREATININE: 0.84 mg/dL (ref 0.60–0.88)
Calcium: 8.6 mg/dL (ref 8.6–10.4)
GFR, Est African American: 75 mL/min (ref >=60)
GFR, Est Non African American: 65 mL/min (ref >=60)
Glucose, Bld: 100 mg/dL — ABNORMAL HIGH (ref 65–99)
Potassium: 3.6 mmol/L (ref 3.5–5.3)
Sodium: 140 mmol/L (ref 135–146)
TOTAL PROTEIN: 6.6 g/dL (ref 6.1–8.1)

## 2016-04-21 LAB — CBC WITH DIFFERENTIAL/PLATELET
BASOS PCT: 1 %
Basophils Absolute: 41 cells/uL (ref 0–200)
EOS ABS: 246 {cells}/uL (ref 15–500)
Eosinophils Relative: 6 %
HEMATOCRIT: 34.7 % — AB (ref 35.0–45.0)
Hemoglobin: 11.6 g/dL — ABNORMAL LOW (ref 11.7–15.5)
LYMPHS PCT: 33 %
Lymphs Abs: 1353 cells/uL (ref 850–3900)
MCH: 28.4 pg (ref 27.0–33.0)
MCHC: 33.4 g/dL (ref 32.0–36.0)
MCV: 85 fL (ref 80.0–100.0)
MONO ABS: 533 {cells}/uL (ref 200–950)
MONOS PCT: 13 %
MPV: 9.8 fL (ref 7.5–12.5)
NEUTROS ABS: 1927 {cells}/uL (ref 1500–7800)
Neutrophils Relative %: 47 %
PLATELETS: 194 10*3/uL (ref 140–400)
RBC: 4.08 MIL/uL (ref 3.80–5.10)
RDW: 15.2 % — AB (ref 11.0–15.0)
WBC: 4.1 10*3/uL (ref 3.8–10.8)

## 2016-04-22 NOTE — Progress Notes (Signed)
Mild anemia, labs are stable.
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
## 2016-05-17 ENCOUNTER — Encounter: Payer: Self-pay | Admitting: Rheumatology

## 2016-05-17 ENCOUNTER — Ambulatory Visit (INDEPENDENT_AMBULATORY_CARE_PROVIDER_SITE_OTHER): Payer: Medicare Other | Admitting: Rheumatology

## 2016-05-17 VITALS — BP 124/74 | HR 78 | Resp 14 | Wt 160.0 lb

## 2016-05-17 DIAGNOSIS — M79642 Pain in left hand: Secondary | ICD-10-CM

## 2016-05-17 DIAGNOSIS — G5702 Lesion of sciatic nerve, left lower limb: Secondary | ICD-10-CM

## 2016-05-17 DIAGNOSIS — M2550 Pain in unspecified joint: Secondary | ICD-10-CM

## 2016-05-17 DIAGNOSIS — M79641 Pain in right hand: Secondary | ICD-10-CM | POA: Diagnosis not present

## 2016-05-17 DIAGNOSIS — M0609 Rheumatoid arthritis without rheumatoid factor, multiple sites: Secondary | ICD-10-CM

## 2016-05-17 DIAGNOSIS — M62838 Other muscle spasm: Secondary | ICD-10-CM | POA: Diagnosis not present

## 2016-05-17 DIAGNOSIS — Z79899 Other long term (current) drug therapy: Secondary | ICD-10-CM

## 2016-05-17 DIAGNOSIS — M546 Pain in thoracic spine: Secondary | ICD-10-CM

## 2016-05-17 MED ORDER — ETANERCEPT 50 MG/ML ~~LOC~~ SOAJ: SUBCUTANEOUS | 0 refills | Status: DC

## 2016-05-17 NOTE — Progress Notes (Signed)
Rheumatology Medication Review by a Pharmacist Does the patient feel that his/her medications are working for him/her?  Yes patient reports improvement since starting Enbrel Has the patient been experiencing any side effects to the medications prescribed?  No Does the patient have any problems obtaining medications?  No - patient is enrolled in the [place] patient assistance program for Enbrel and receives her medication free of charge from the manufacturer.    Issues to address at subsequent visits: None   Pharmacist comments:  Erin Schmidt is a very pleasant 83 yo F who presents for follow up.  She is currently taking Enbrel 50 mg weekly and hydroxychloroquine 200 mg BID.  Patient started the Enbrel on 04/03/16.  She had standing labs on 04/21/16.  Patient had hydroxychloroquine eye exam on 04/18/16 which was normal.  Patient denies any questions or concerns regarding her medications today.   Erin Schmidt, Pharm.D., BCPS, CPP Clinical Pharmacist Pager: [phone] Phone: [phone] 05/17/2016 12:19 PM

## 2016-06-20 ENCOUNTER — Other Ambulatory Visit: Payer: Self-pay

## 2016-06-20 ENCOUNTER — Other Ambulatory Visit: Payer: Self-pay | Admitting: Rheumatology

## 2016-06-20 DIAGNOSIS — Z79899 Other long term (current) drug therapy: Secondary | ICD-10-CM

## 2016-06-20 LAB — CBC WITH DIFFERENTIAL/PLATELET
BASOS ABS: 47 {cells}/uL (ref 0–200)
BASOS PCT: 1 %
Eosinophils Absolute: 188 cells/uL (ref 15–500)
Eosinophils Relative: 4 %
HEMATOCRIT: 35.7 % (ref 35.0–45.0)
HEMOGLOBIN: 11.8 g/dL (ref 11.7–15.5)
Lymphocytes Relative: 36 %
Lymphs Abs: 1692 cells/uL (ref 850–3900)
MCH: 27.9 pg (ref 27.0–33.0)
MCHC: 33.1 g/dL (ref 32.0–36.0)
MCV: 84.4 fL (ref 80.0–100.0)
MONO ABS: 376 {cells}/uL (ref 200–950)
MONOS PCT: 8 %
MPV: 9.9 fL (ref 7.5–12.5)
NEUTROS ABS: 2397 {cells}/uL (ref 1500–7800)
Neutrophils Relative %: 51 %
PLATELETS: 229 10*3/uL (ref 140–400)
RBC: 4.23 MIL/uL (ref 3.80–5.10)
RDW: 14.7 % (ref 11.0–15.0)
WBC: 4.7 10*3/uL (ref 3.8–10.8)

## 2016-06-20 MED ORDER — ETANERCEPT 50 MG/ML ~~LOC~~ SOAJ
50.0000 mg | SUBCUTANEOUS | 0 refills | Status: DC
Start: 2016-06-20 — End: 2016-10-04

## 2016-06-20 NOTE — Telephone Encounter (Signed)
Last Visit: 05/17/16 Next Visit:07/19/16 Labs: 04/21/16 Mild anemia. Labs stable TB Gold: 05/27/15 Patient uses [facility] will fax form  Okay to refill Enbrel?

## 2016-06-20 NOTE — Telephone Encounter (Signed)
Patient is requesting a refill on her Enbrel.  She uses [employer] for these refills.  UZ#[phone].  Thank you.

## 2016-06-20 NOTE — Telephone Encounter (Signed)
Patient needs TB gold on her June 2018 labs and CBC with differential and CMP with GFR. Please orderOkay to refill Enbrel.

## 2016-06-20 NOTE — Telephone Encounter (Signed)
Patient updated labs today and will come back tomorrow for tb gold. Prescription faxed to [employer].

## 2016-06-21 ENCOUNTER — Other Ambulatory Visit: Payer: Self-pay | Admitting: Radiology

## 2016-06-21 DIAGNOSIS — D899 Disorder involving the immune mechanism, unspecified: Secondary | ICD-10-CM | POA: Diagnosis not present

## 2016-06-21 DIAGNOSIS — Z79899 Other long term (current) drug therapy: Secondary | ICD-10-CM | POA: Diagnosis not present

## 2016-06-21 DIAGNOSIS — D849 Immunodeficiency, unspecified: Secondary | ICD-10-CM

## 2016-06-21 LAB — COMPLETE METABOLIC PANEL WITH GFR
ALBUMIN: 3.7 g/dL (ref 3.6–5.1)
ALT: 12 U/L (ref 6–29)
AST: 20 U/L (ref 10–35)
Alkaline Phosphatase: 63 U/L (ref 33–130)
BILIRUBIN TOTAL: 0.5 mg/dL (ref 0.2–1.2)
BUN: 13 mg/dL (ref 7–25)
CALCIUM: 8.7 mg/dL (ref 8.6–10.4)
CO2: 24 mmol/L (ref 20–31)
CREATININE: 0.91 mg/dL — AB (ref 0.60–0.88)
Chloride: 101 mmol/L (ref 98–110)
GFR, EST AFRICAN AMERICAN: 68 mL/min (ref >=60)
GFR, EST NON AFRICAN AMERICAN: 59 mL/min — AB (ref >=60)
Glucose, Bld: 102 mg/dL — ABNORMAL HIGH (ref 65–99)
Potassium: 3.8 mmol/L (ref 3.5–5.3)
Sodium: 137 mmol/L (ref 135–146)
TOTAL PROTEIN: 6.6 g/dL (ref 6.1–8.1)

## 2016-06-21 NOTE — Progress Notes (Signed)
Creatinine mildly elevated. No change in therapy. Please forward labs to PCP.

## 2016-06-23 LAB — QUANTIFERON TB GOLD ASSAY (BLOOD)
Interferon Gamma Release Assay: NEGATIVE
Mitogen-Nil: 3.61 IU/mL
Quantiferon Nil Value: 0.03 IU/mL
Quantiferon Tb Ag Minus Nil Value: 0 IU/mL

## 2016-06-23 NOTE — Progress Notes (Signed)
Within normal limits

## 2016-06-26 ENCOUNTER — Telehealth: Payer: Self-pay | Admitting: Rheumatology

## 2016-06-26 ENCOUNTER — Telehealth: Payer: Self-pay | Admitting: *Deleted

## 2016-06-26 NOTE — Telephone Encounter (Signed)
Patient called stating that she has not received her shipment of Enbrel.  She is wanting to know if she can come by and pick up one from our office.  ZF#[phone].  She lives in [place], which will take her about 30 minutes to get here.

## 2016-06-26 NOTE — Telephone Encounter (Signed)
Medication Samples have been provided to the patient.  Drug name: Enbrel     Strength: 50 mg       Qty: 1  WIO:[id number]  Exp.Date: 04/20  Dosing instructions: Inject 1 pen under skin weekly.  The patient has been instructed regarding the correct time, dose, and frequency of taking this medication, including desired effects and most common side effects.   Gwenlyn Perking 4:28 PM 06/26/2016

## 2016-06-26 NOTE — Telephone Encounter (Signed)
Patient advised she may come by to get sample.

## 2016-06-26 NOTE — Telephone Encounter (Signed)
Last Visit: 05/17/16 Next Visit: 07/19/16 Labs: 06/20/16 Creatinine mildly elevated TB Gold: 06/21/16 Neg   Okay to Provide sample of Enbrel?

## 2016-06-26 NOTE — Telephone Encounter (Signed)
Yes, as we discussed, ok to give sample of enbrel

## 2016-07-05 ENCOUNTER — Other Ambulatory Visit (HOSPITAL_COMMUNITY): Payer: Self-pay | Admitting: Endocrinology

## 2016-07-05 DIAGNOSIS — E049 Nontoxic goiter, unspecified: Secondary | ICD-10-CM

## 2016-07-10 ENCOUNTER — Other Ambulatory Visit (HOSPITAL_COMMUNITY): Payer: Self-pay | Admitting: Family Medicine

## 2016-07-10 DIAGNOSIS — E2839 Other primary ovarian failure: Secondary | ICD-10-CM

## 2016-07-13 NOTE — Progress Notes (Signed)
#  4: Bilateral hand pain. Synovitis noted to bilateral second and third MCP joint. She's been on 7 weeks of Enbrel and we feel that if she were to take the Enbrel for another few months we can see the maximum effect of Enbrel at that time. Patient is happy to give Enbrel more time to see its effectiveness(patient has failed Arava and methotrexate in the past).  #5: Return to clinic in 2 months and at that time we will do an ultrasound of bilateral hands and wrist and an office visit  Panwala, Naitik,    Ultrasound findings were discussed with patient. She had mild synovitis in right hand and right wrist. Erosion was noted in right second MCP joint. Bilateral median nerves are within normal limits. She's been having tingling in her bilateral hands especially her left hand at night. Allen's test is positive and Tinel's is positive. Although her median nerve were within normal limits. Abdomen or prescription for right carpal tunnel brace. We decided to continue current treatment for right now. We will hold off any change in therapy at this point. If her symptoms persist at the next visit we may consider switching therapy. Bo Merino, MD

## 2016-07-18 ENCOUNTER — Ambulatory Visit: Payer: Medicare Other | Admitting: Rheumatology

## 2016-07-19 ENCOUNTER — Ambulatory Visit (INDEPENDENT_AMBULATORY_CARE_PROVIDER_SITE_OTHER): Payer: Medicare Other | Admitting: Rheumatology

## 2016-07-19 ENCOUNTER — Inpatient Hospital Stay (INDEPENDENT_AMBULATORY_CARE_PROVIDER_SITE_OTHER): Payer: Self-pay

## 2016-07-19 DIAGNOSIS — M79642 Pain in left hand: Secondary | ICD-10-CM

## 2016-07-19 DIAGNOSIS — M79641 Pain in right hand: Secondary | ICD-10-CM

## 2016-07-31 ENCOUNTER — Encounter (HOSPITAL_COMMUNITY): Payer: Self-pay

## 2016-07-31 ENCOUNTER — Other Ambulatory Visit (HOSPITAL_COMMUNITY): Payer: Medicare Other

## 2016-08-07 ENCOUNTER — Ambulatory Visit (HOSPITAL_COMMUNITY)
Admission: RE | Admit: 2016-08-07 | Discharge: 2016-08-07 | Disposition: A | Discharge status: Home / Self Care | Payer: Medicare Other | Source: Ambulatory Visit | Attending: Family Medicine | Admitting: Family Medicine

## 2016-08-07 ENCOUNTER — Ambulatory Visit (HOSPITAL_COMMUNITY): Admission: RE | Admit: 2016-08-07 | Payer: Medicare Other | Source: Ambulatory Visit

## 2016-08-07 DIAGNOSIS — E2839 Other primary ovarian failure: Secondary | ICD-10-CM | POA: Insufficient documentation

## 2016-08-07 DIAGNOSIS — R7301 Impaired fasting glucose: Secondary | ICD-10-CM | POA: Diagnosis not present

## 2016-08-07 DIAGNOSIS — M85852 Other specified disorders of bone density and structure, left thigh: Secondary | ICD-10-CM | POA: Insufficient documentation

## 2016-08-07 DIAGNOSIS — Z780 Asymptomatic menopausal state: Secondary | ICD-10-CM | POA: Diagnosis not present

## 2016-08-07 DIAGNOSIS — M8589 Other specified disorders of bone density and structure, multiple sites: Secondary | ICD-10-CM | POA: Diagnosis not present

## 2016-08-07 DIAGNOSIS — E039 Hypothyroidism, unspecified: Secondary | ICD-10-CM | POA: Diagnosis not present

## 2016-08-09 ENCOUNTER — Ambulatory Visit (HOSPITAL_COMMUNITY)
Admission: RE | Admit: 2016-08-09 | Discharge: 2016-08-09 | Disposition: A | Discharge status: Home / Self Care | Payer: Medicare Other | Source: Ambulatory Visit | Attending: Endocrinology | Admitting: Endocrinology

## 2016-08-09 DIAGNOSIS — E049 Nontoxic goiter, unspecified: Secondary | ICD-10-CM | POA: Insufficient documentation

## 2016-08-09 DIAGNOSIS — E041 Nontoxic single thyroid nodule: Secondary | ICD-10-CM | POA: Diagnosis not present

## 2016-08-14 DIAGNOSIS — R7301 Impaired fasting glucose: Secondary | ICD-10-CM | POA: Diagnosis not present

## 2016-08-14 DIAGNOSIS — E049 Nontoxic goiter, unspecified: Secondary | ICD-10-CM | POA: Diagnosis not present

## 2016-08-14 DIAGNOSIS — I10 Essential (primary) hypertension: Secondary | ICD-10-CM | POA: Diagnosis not present

## 2016-08-14 DIAGNOSIS — E039 Hypothyroidism, unspecified: Secondary | ICD-10-CM | POA: Diagnosis not present

## 2016-08-21 ENCOUNTER — Other Ambulatory Visit: Payer: Self-pay

## 2016-08-21 ENCOUNTER — Other Ambulatory Visit: Payer: Self-pay | Admitting: *Deleted

## 2016-08-21 DIAGNOSIS — Z79899 Other long term (current) drug therapy: Secondary | ICD-10-CM | POA: Diagnosis not present

## 2016-08-21 LAB — CBC WITH DIFFERENTIAL/PLATELET
BASOS ABS: 52 {cells}/uL (ref 0–200)
BASOS PCT: 1 %
EOS ABS: 208 {cells}/uL (ref 15–500)
Eosinophils Relative: 4 %
HEMATOCRIT: 36.7 % (ref 35.0–45.0)
HEMOGLOBIN: 11.8 g/dL (ref 11.7–15.5)
LYMPHS ABS: 1872 {cells}/uL (ref 850–3900)
Lymphocytes Relative: 36 %
MCH: 27.1 pg (ref 27.0–33.0)
MCHC: 32.2 g/dL (ref 32.0–36.0)
MCV: 84.4 fL (ref 80.0–100.0)
MONO ABS: 572 {cells}/uL (ref 200–950)
MPV: 9.6 fL (ref 7.5–12.5)
Monocytes Relative: 11 %
NEUTROS ABS: 2496 {cells}/uL (ref 1500–7800)
Neutrophils Relative %: 48 %
PLATELETS: 245 10*3/uL (ref 140–400)
RBC: 4.35 MIL/uL (ref 3.80–5.10)
RDW: 14.9 % (ref 11.0–15.0)
WBC: 5.2 10*3/uL (ref 3.8–10.8)

## 2016-08-21 LAB — COMPLETE METABOLIC PANEL WITH GFR
ALT: 11 U/L (ref 6–29)
AST: 18 U/L (ref 10–35)
Albumin: 4 g/dL (ref 3.6–5.1)
Alkaline Phosphatase: 62 U/L (ref 33–130)
BILIRUBIN TOTAL: 0.6 mg/dL (ref 0.2–1.2)
BUN: 14 mg/dL (ref 7–25)
CHLORIDE: 102 mmol/L (ref 98–110)
CO2: 28 mmol/L (ref 20–32)
Calcium: 9.2 mg/dL (ref 8.6–10.4)
Creat: 0.93 mg/dL — ABNORMAL HIGH (ref 0.60–0.88)
GFR, EST AFRICAN AMERICAN: 66 mL/min (ref >=60)
GFR, EST NON AFRICAN AMERICAN: 57 mL/min — AB (ref >=60)
GLUCOSE: 89 mg/dL (ref 65–99)
POTASSIUM: 4 mmol/L (ref 3.5–5.3)
Sodium: 140 mmol/L (ref 135–146)
TOTAL PROTEIN: 6.8 g/dL (ref 6.1–8.1)

## 2016-08-22 ENCOUNTER — Telehealth: Payer: Self-pay | Admitting: Radiology

## 2016-08-22 NOTE — Telephone Encounter (Signed)
I have called patient to advise labs are stable  

## 2016-08-22 NOTE — Telephone Encounter (Signed)
-----   Message from Bo Merino, MD sent at 08/22/2016 10:40 AM EDT ----- Labs are stable

## 2016-08-22 NOTE — Progress Notes (Signed)
Labs are stable.

## 2016-09-15 ENCOUNTER — Telehealth: Payer: Self-pay | Admitting: Rheumatology

## 2016-09-15 NOTE — Telephone Encounter (Signed)
Patient needs refill on Enbrel. Please send to [facility]. Phone # [phone]

## 2016-09-15 NOTE — Telephone Encounter (Signed)
Last Visit: 07/19/16 Next Visit: 10/04/16 Labs: 08/21/16 stable  TB Gold: 06/21/16 Neg   Okay to refill per Dr. Estanislado Pandy
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
## 2016-10-04 ENCOUNTER — Encounter: Payer: Self-pay | Admitting: Rheumatology

## 2016-10-04 ENCOUNTER — Telehealth: Payer: Self-pay

## 2016-10-04 ENCOUNTER — Ambulatory Visit (INDEPENDENT_AMBULATORY_CARE_PROVIDER_SITE_OTHER): Payer: Medicare Other | Admitting: Rheumatology

## 2016-10-04 VITALS — BP 130/60 | HR 74 | Resp 16 | Ht 67.0 in | Wt 158.0 lb

## 2016-10-04 DIAGNOSIS — M19042 Primary osteoarthritis, left hand: Secondary | ICD-10-CM | POA: Diagnosis not present

## 2016-10-04 DIAGNOSIS — M19041 Primary osteoarthritis, right hand: Secondary | ICD-10-CM

## 2016-10-04 DIAGNOSIS — M0609 Rheumatoid arthritis without rheumatoid factor, multiple sites: Secondary | ICD-10-CM | POA: Diagnosis not present

## 2016-10-04 DIAGNOSIS — M19072 Primary osteoarthritis, left ankle and foot: Secondary | ICD-10-CM | POA: Diagnosis not present

## 2016-10-04 DIAGNOSIS — M170 Bilateral primary osteoarthritis of knee: Secondary | ICD-10-CM

## 2016-10-04 DIAGNOSIS — Z8639 Personal history of other endocrine, nutritional and metabolic disease: Secondary | ICD-10-CM | POA: Diagnosis not present

## 2016-10-04 DIAGNOSIS — Z79899 Other long term (current) drug therapy: Secondary | ICD-10-CM

## 2016-10-04 DIAGNOSIS — Z8679 Personal history of other diseases of the circulatory system: Secondary | ICD-10-CM | POA: Diagnosis not present

## 2016-10-04 DIAGNOSIS — Z8659 Personal history of other mental and behavioral disorders: Secondary | ICD-10-CM

## 2016-10-04 DIAGNOSIS — M19071 Primary osteoarthritis, right ankle and foot: Secondary | ICD-10-CM | POA: Diagnosis not present

## 2016-10-04 NOTE — Telephone Encounter (Signed)
Patient came by in office to discuss her patient assistance application for Enbrel through [employer]. She is currently enrolled through 01/08/2017. She will need to submit an application for 5035. We filled out the application and she signed her portion. I will submit a prior authorization for Enbrel to her Part D plan to submit along with her application.The foundation will not start renewing application for 4656 until 10/2. Will need provider portion signed before I can fax the application. Will fax on 10/2 and update once we receive a response.   Nikolos Billig, [place], CPhT  4:10 PM

## 2016-10-04 NOTE — Patient Instructions (Signed)
Standing Labs We placed an order today for your standing lab work.    Please come back and get your standing labs in November and every 3 months  We have open lab Monday through Friday from 8:30-11:30 AM and 1:30-4 PM at the office of Dr. Cheryllynn Sarff.   The office is located at [address], [address], [place], [place] [address] No appointment is necessary.   Labs are drawn by [facility].  You may receive a bill from [facility] for your lab work. If you have any questions regarding directions or hours of operation,  please call [phone].    

## 2016-10-05 ENCOUNTER — Telehealth: Payer: Self-pay

## 2016-10-05 NOTE — Telephone Encounter (Signed)
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 Request was submitted to pt insurance via cover my meds.   Received a confirmation of approval for authorization. Patient has been approved through 01/08/2017.   Reference number: [id number] Phone number: [phone]  Will send document to scan center and fax a copy to [employer] for assistance for 2019.  Vlada Uriostegui, [place], CPhT 9:11 AM

## 2016-10-23 ENCOUNTER — Telehealth: Payer: Self-pay

## 2016-10-23 NOTE — Telephone Encounter (Signed)
Returned patient's phone call and advised patient to call [employer] directly. Verified phone number with patient and she verbalized understanding.
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
## 2016-10-25 ENCOUNTER — Telehealth: Payer: Self-pay

## 2016-10-25 DIAGNOSIS — Z961 Presence of intraocular lens: Secondary | ICD-10-CM | POA: Diagnosis not present

## 2016-10-25 DIAGNOSIS — H43823 Vitreomacular adhesion, bilateral: Secondary | ICD-10-CM | POA: Diagnosis not present

## 2016-10-25 DIAGNOSIS — H04123 Dry eye syndrome of bilateral lacrimal glands: Secondary | ICD-10-CM | POA: Diagnosis not present

## 2016-10-25 NOTE — Telephone Encounter (Signed)
Was having problems with the clinic fax machine. Patient's Designer, industrial/product for 2019 was submitted via electronic fax. Will update once we receive a response.   Fax: [phone] Phone: [phone]  Demetrios Loll, CPhT 3:33 PM

## 2016-10-31 NOTE — Telephone Encounter (Signed)
Called foundation to check the status of patient's application. Spoke with representative who states that the application has bee received and will be processed soon. She could not give me a turnaround time. Will update once we receive a response.   Erin Schmidt, [place], CPhT 2:16 PM

## 2016-11-01 DIAGNOSIS — Z1389 Encounter for screening for other disorder: Secondary | ICD-10-CM | POA: Diagnosis not present

## 2016-11-01 DIAGNOSIS — I10 Essential (primary) hypertension: Secondary | ICD-10-CM | POA: Diagnosis not present

## 2016-11-01 DIAGNOSIS — Z23 Encounter for immunization: Secondary | ICD-10-CM | POA: Diagnosis not present

## 2016-11-01 DIAGNOSIS — Z0001 Encounter for general adult medical examination with abnormal findings: Secondary | ICD-10-CM | POA: Diagnosis not present

## 2016-11-01 DIAGNOSIS — M2550 Pain in unspecified joint: Secondary | ICD-10-CM | POA: Diagnosis not present

## 2016-11-01 DIAGNOSIS — M069 Rheumatoid arthritis, unspecified: Secondary | ICD-10-CM | POA: Diagnosis not present

## 2016-11-10 NOTE — Telephone Encounter (Signed)
Received a fax from [employer] stating that the patient is eligible to receive Enbrel through the foundation free of charge from 01/09/17 through 01/08/2018.   Case ID: [id number]  Phone: [phone]  Will send document to scan center.   Called patient to update. Patient voices understanding and denies any questions at this time.  Nyella Eckels, [place], CPhT 9:38 AM

## 2016-11-23 DIAGNOSIS — M1991 Primary osteoarthritis, unspecified site: Secondary | ICD-10-CM | POA: Diagnosis not present

## 2016-11-23 DIAGNOSIS — N342 Other urethritis: Secondary | ICD-10-CM | POA: Diagnosis not present

## 2016-11-23 DIAGNOSIS — E063 Autoimmune thyroiditis: Secondary | ICD-10-CM | POA: Diagnosis not present

## 2016-12-11 ENCOUNTER — Other Ambulatory Visit: Payer: Self-pay

## 2016-12-11 DIAGNOSIS — Z79899 Other long term (current) drug therapy: Secondary | ICD-10-CM

## 2016-12-11 LAB — COMPLETE METABOLIC PANEL WITHOUT GFR
AG Ratio: 1.5 (calc) (ref 1.0–2.5)
ALT: 9 U/L (ref 6–29)
AST: 18 U/L (ref 10–35)
Albumin: 3.9 g/dL (ref 3.6–5.1)
Alkaline phosphatase (APISO): 62 U/L (ref 33–130)
BUN/Creatinine Ratio: 18 (calc) (ref 6–22)
BUN: 16 mg/dL (ref 7–25)
CO2: 29 mmol/L (ref 20–32)
Calcium: 8.9 mg/dL (ref 8.6–10.4)
Chloride: 103 mmol/L (ref 98–110)
Creat: 0.9 mg/dL — ABNORMAL HIGH (ref 0.60–0.88)
GFR, Est African American: 69 mL/min/{1.73_m2}
GFR, Est Non African American: 60 mL/min/{1.73_m2}
Globulin: 2.6 g/dL (ref 1.9–3.7)
Glucose, Bld: 108 mg/dL — ABNORMAL HIGH (ref 65–99)
Potassium: 4.3 mmol/L (ref 3.5–5.3)
Sodium: 139 mmol/L (ref 135–146)
Total Bilirubin: 0.4 mg/dL (ref 0.2–1.2)
Total Protein: 6.5 g/dL (ref 6.1–8.1)

## 2016-12-11 LAB — CBC WITH DIFFERENTIAL/PLATELET
BASOS ABS: 30 {cells}/uL (ref 0–200)
Basophils Relative: 0.5 %
EOS ABS: 162 {cells}/uL (ref 15–500)
Eosinophils Relative: 2.7 %
HCT: 33.1 % — ABNORMAL LOW (ref 35.0–45.0)
HEMOGLOBIN: 10.9 g/dL — AB (ref 11.7–15.5)
Lymphs Abs: 2034 cells/uL (ref 850–3900)
MCH: 27 pg (ref 27.0–33.0)
MCHC: 32.9 g/dL (ref 32.0–36.0)
MCV: 81.9 fL (ref 80.0–100.0)
MONOS PCT: 9.2 %
MPV: 9.9 fL (ref 7.5–12.5)
Neutro Abs: 3222 cells/uL (ref 1500–7800)
Neutrophils Relative %: 53.7 %
PLATELETS: 254 10*3/uL (ref 140–400)
RBC: 4.04 10*6/uL (ref 3.80–5.10)
RDW: 13.8 % (ref 11.0–15.0)
TOTAL LYMPHOCYTE: 33.9 %
WBC mixed population: 552 cells/uL (ref 200–950)
WBC: 6 10*3/uL (ref 3.8–10.8)

## 2016-12-12 NOTE — Progress Notes (Signed)
Anemia. Pl notify Pt and fax results to her PCP.

## 2017-01-10 ENCOUNTER — Other Ambulatory Visit (HOSPITAL_COMMUNITY): Payer: Self-pay | Admitting: Family Medicine

## 2017-01-10 DIAGNOSIS — Z1231 Encounter for screening mammogram for malignant neoplasm of breast: Secondary | ICD-10-CM

## 2017-01-11 ENCOUNTER — Telehealth: Payer: Self-pay

## 2017-01-11 NOTE — Telephone Encounter (Signed)
Received a fax from [employer] regarding a prior authorization approval for ENBREL through 01/08/2018.   Reference number:[id number] Phone number:[phone]  Will send document to scan center.  Jaxtin Raimondo, [place], CPhT 2:41 PM

## 2017-01-11 NOTE — Telephone Encounter (Signed)
Received a prior authorization renewal request for Enbrel. Authorization was submitted to pts insurance via cover my meds. Will update once we receive a response.   Huberta Tompkins, [place], CPhT 2:22 PM

## 2017-02-08 DIAGNOSIS — E782 Mixed hyperlipidemia: Secondary | ICD-10-CM | POA: Diagnosis not present

## 2017-02-08 DIAGNOSIS — R7309 Other abnormal glucose: Secondary | ICD-10-CM | POA: Diagnosis not present

## 2017-02-08 DIAGNOSIS — I10 Essential (primary) hypertension: Secondary | ICD-10-CM | POA: Diagnosis not present

## 2017-02-08 DIAGNOSIS — M069 Rheumatoid arthritis, unspecified: Secondary | ICD-10-CM | POA: Diagnosis not present

## 2017-02-08 DIAGNOSIS — E039 Hypothyroidism, unspecified: Secondary | ICD-10-CM | POA: Diagnosis not present
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
## 2017-02-16 ENCOUNTER — Ambulatory Visit (HOSPITAL_COMMUNITY)
Admission: RE | Admit: 2017-02-16 | Discharge: 2017-02-16 | Disposition: A | Discharge status: Home / Self Care | Payer: Medicare Other | Source: Ambulatory Visit | Attending: Family Medicine | Admitting: Family Medicine

## 2017-02-16 DIAGNOSIS — Z1231 Encounter for screening mammogram for malignant neoplasm of breast: Secondary | ICD-10-CM

## 2017-02-19 ENCOUNTER — Encounter: Payer: Self-pay | Admitting: Physician Assistant

## 2017-02-19 ENCOUNTER — Ambulatory Visit: Payer: Medicare Other | Admitting: Physician Assistant

## 2017-02-19 VITALS — BP 147/76 | HR 69 | Resp 16 | Ht 67.0 in | Wt 161.0 lb

## 2017-02-19 DIAGNOSIS — Z8659 Personal history of other mental and behavioral disorders: Secondary | ICD-10-CM | POA: Diagnosis not present

## 2017-02-19 DIAGNOSIS — Z8639 Personal history of other endocrine, nutritional and metabolic disease: Secondary | ICD-10-CM | POA: Diagnosis not present

## 2017-02-19 DIAGNOSIS — M0609 Rheumatoid arthritis without rheumatoid factor, multiple sites: Secondary | ICD-10-CM

## 2017-02-19 DIAGNOSIS — G5702 Lesion of sciatic nerve, left lower limb: Secondary | ICD-10-CM | POA: Diagnosis not present

## 2017-02-19 DIAGNOSIS — I10 Essential (primary) hypertension: Secondary | ICD-10-CM | POA: Diagnosis not present

## 2017-02-19 DIAGNOSIS — Z79899 Other long term (current) drug therapy: Secondary | ICD-10-CM | POA: Diagnosis not present

## 2017-02-19 DIAGNOSIS — M62838 Other muscle spasm: Secondary | ICD-10-CM

## 2017-02-19 DIAGNOSIS — M19041 Primary osteoarthritis, right hand: Secondary | ICD-10-CM | POA: Diagnosis not present

## 2017-02-19 DIAGNOSIS — L853 Xerosis cutis: Secondary | ICD-10-CM | POA: Diagnosis not present

## 2017-02-19 DIAGNOSIS — M19071 Primary osteoarthritis, right ankle and foot: Secondary | ICD-10-CM | POA: Diagnosis not present

## 2017-02-19 DIAGNOSIS — M19042 Primary osteoarthritis, left hand: Secondary | ICD-10-CM

## 2017-02-19 DIAGNOSIS — M19072 Primary osteoarthritis, left ankle and foot: Secondary | ICD-10-CM

## 2017-02-19 DIAGNOSIS — M170 Bilateral primary osteoarthritis of knee: Secondary | ICD-10-CM | POA: Diagnosis not present

## 2017-02-19 MED ORDER — HYDROXYCHLOROQUINE SULFATE 200 MG PO TABS: 200.0000 mg | ORAL_TABLET | Freq: Two times a day (BID) | ORAL | 0 refills | Status: DC

## 2017-02-19 NOTE — Patient Instructions (Addendum)
Standing Labs We placed an order today for your standing lab work.    Please come back and get your standing labs in March and every 3 months  We have open lab Monday through Friday from 8:30-11:30 AM and 1:30-4 PM at the office of Dr. Bo Merino.   The office is located at [address], [place], [place], [place] [address] No appointment is necessary.   Labs are drawn by [facility].  You may receive a bill from [facility] for your lab work. If you have any questions regarding directions or hours of operation,  please call [phone].    Piriformis Syndrome Piriformis syndrome is a condition that can cause pain and numbness in your buttocks and down the back of your leg. Piriformis syndrome happens when the small muscle that connects the base of your spine to your hip (piriformis muscle) presses on the nerve that runs down the back of your leg (sciatic nerve). The piriformis muscle helps your hip rotate and helps to bring your leg back and out. It also helps shift your weight while you are walking to keep you stable. The sciatic nerve runs under or through the piriformis. Damage to the piriformis muscle can cause spasms that put pressure on the nerve below. This causes pain and discomfort while sitting and moving. The pain may feel as if it begins in the buttock and spreads (radiates) down your hip and thigh. What are the causes? This condition is caused by pressure on the sciatic nerve from the piriformis muscle. The piriformis muscle can get irritated with overuse, especially if other hip muscles are weak and the piriformis has to do extra work. Piriformis syndrome can also occur after an injury, like a fall onto your buttocks. What increases the risk? This condition is more likely to develop in:  Women.  People who sit for long periods of time.  Cyclists.  People who have weak buttocks muscles (gluteal muscles).  What are the signs or symptoms? Pain, tingling, or numbness that  starts in the buttock and runs down the back of your leg (sciatica) is the most common symptom of this condition. Your symptoms may:  Get worse the longer you sit.  Get worse when you walk, run, or go up on stairs.  How is this diagnosed? This condition is diagnosed based on your symptoms, medical history, and physical exam. During this exam, your health care provider may move your leg into different positions to check for pain. He or she will also press on the muscles of your hip and buttock to see if that increases your symptoms. You may also have an X-ray or MRI. How is this treated? Treatment for this condition may include:  Stopping all activities that cause pain or make your condition worse.  Using heat or ice to relieve pain as told by your health care provider.  Taking medicines to reduce pain and swelling.  Taking a muscle relaxer to release the piriformis muscle.  Doing range-of-motion and strengthening exercises (physical therapy) as told by your health care provider.  Massaging the affected area.  Getting an injection of an anti-inflammatory medicine or muscle relaxer to reduce inflammation and muscle tension.  In rare cases, you may need surgery to cut the muscle and release pressure on the nerve if other treatments do not work. Follow these instructions at home:  Take over-the-counter and prescription medicines only as told by your health care provider.  Do not sit for long periods. Get up and walk  around every 20 minutes or as often as told by your health care provider.  If directed, apply heat to the affected area as often as told by your health care provider. Use the heat source that your health care provider recommends, such as a moist heat pack or a heating pad. ? Place a towel between your skin and the heat source. ? Leave the heat on for 20-30 minutes. ? Remove the heat if your skin turns bright red. This is especially important if you are unable to feel pain,  heat, or cold. You may have a greater risk of getting burned.  If directed, apply ice to the injured area. ? Put ice in a plastic bag. ? Place a towel between your skin and the bag. ? Leave the ice on for 20 minutes, 2-3 times a day.  Do exercises as told by your health care provider.  Return to your normal activities as told by your health care provider. Ask your health care provider what activities are safe for you.  Keep all follow-up visits as told by your health care provider. This is important. How is this prevented?  Do not sit for longer than 20 minutes at a time. When you sit, choose padded surfaces.  Warm up and stretch before being active.  Cool down and stretch after being active.  Give your body time to rest between periods of activity.  Make sure to use equipment that fits you.  Maintain physical fitness, including: ? Strength. ? Flexibility. Contact a health care provider if:  Your pain and stiffness continue or get worse.  Your leg or hip becomes weak.  You have changes in your bowel function or bladder function. This information is not intended to replace advice given to you by your health care provider. Make sure you discuss any questions you have with your health care provider. Document Released: 12/26/2004 Document Revised: 08/31/2015 Document Reviewed: 12/08/2014 Elsevier Interactive Patient Education  2018 [employer].  Piriformis Syndrome Rehab Ask your health care provider which exercises are safe for you. Do exercises exactly as told by your health care provider and adjust them as directed. It is normal to feel mild stretching, pulling, tightness, or discomfort as you do these exercises, but you should stop right away if you feel sudden pain or your pain gets worse.Do not begin these exercises until told by your health care provider. Stretching and range of motion exercises These exercises warm up your muscles and joints and improve the movement  and flexibility of your hip and pelvis. These exercises also help to relieve pain, numbness, and tingling. Exercise A: Hip rotators  1. Lie on your back on a firm surface. 2. Pull your left / right knee toward your same shoulder with your left / right hand until your knee is pointing toward the ceiling. Hold your left / right ankle with your other hand. 3. Keeping your knee steady, gently pull your left / right ankle toward your other shoulder until you feel a stretch in your buttocks. 4. Hold this position for __________ seconds. Repeat __________ times. Complete this stretch __________ times a day. Exercise B: Hip extensors 1. Lie on your back on a firm surface. Both of your legs should be straight. 2. Pull your left / right knee to your chest. Hold your leg in this position by holding onto the back of your thigh or the front of your knee. 3. Hold this position for __________ seconds. 4. Slowly return to the starting  position. Repeat __________ times. Complete this stretch __________ times a day. Strengthening exercises These exercises build strength and endurance in your hip and thigh muscles. Endurance is the ability to use your muscles for a long time, even after they get tired. Exercise C: Straight leg raises ( hip abductors) 1. Lie on your side with your left / right leg in the top position. Lie so your head, shoulder, knee, and hip line up. Bend your bottom knee to help you balance. 2. Lift your top leg up 4-6 inches (10-15 cm), keeping your toes pointed straight ahead. 3. Hold this position for __________ seconds. 4. Slowly lower your leg to the starting position. Let your muscles relax completely. Repeat __________ times. Complete this exercise__________ times a day. Exercise D: Hip abductors and rotators, quadruped  1. Get on your hands and knees on a firm, lightly padded surface. Your hands should be directly below your shoulders, and your knees should be directly below your  hips. 2. Lift your left / right knee out to the side. Keep your knee bent. Do not twist your body. 3. Hold this position for __________ seconds. 4. Slowly lower your leg. Repeat __________ times. Complete this exercise__________ times a day. Exercise E: Straight leg raises ( hip extensors) 1. Lie on your abdomen on a bed or a firm surface with a pillow under your hips. 2. Squeeze your buttock muscles and lift your left / right thigh off the bed. Do not let your back arch. 3. Hold this position for __________ seconds. 4. Slowly return to the starting position. Let your muscles relax completely before doing another repetition. Repeat __________ times. Complete this exercise__________ times a day. This information is not intended to replace advice given to you by your health care provider. Make sure you discuss any questions you have with your health care provider. Document Released: 12/26/2004 Document Revised: 08/31/2015 Document Reviewed: 12/08/2014 Elsevier Interactive Patient Education  [employer].

## 2017-02-26 ENCOUNTER — Ambulatory Visit (HOSPITAL_COMMUNITY)
Admission: RE | Admit: 2017-02-26 | Discharge: 2017-02-26 | Disposition: A | Discharge status: Home / Self Care | Payer: Medicare Other | Source: Ambulatory Visit | Attending: Family Medicine | Admitting: Family Medicine

## 2017-02-26 ENCOUNTER — Other Ambulatory Visit (HOSPITAL_COMMUNITY): Payer: Self-pay | Admitting: Family Medicine

## 2017-02-26 DIAGNOSIS — S0990XA Unspecified injury of head, initial encounter: Secondary | ICD-10-CM | POA: Insufficient documentation

## 2017-02-26 DIAGNOSIS — W19XXXA Unspecified fall, initial encounter: Secondary | ICD-10-CM | POA: Diagnosis not present

## 2017-02-26 DIAGNOSIS — R42 Dizziness and giddiness: Secondary | ICD-10-CM | POA: Diagnosis not present

## 2017-02-26 DIAGNOSIS — R55 Syncope and collapse: Secondary | ICD-10-CM | POA: Diagnosis not present

## 2017-02-28 ENCOUNTER — Ambulatory Visit: Payer: Medicare Other | Admitting: Rheumatology

## 2017-03-02 ENCOUNTER — Telehealth: Payer: Self-pay | Admitting: Rheumatology

## 2017-03-02 NOTE — Telephone Encounter (Signed)
Patient called stating that [facility] called regarding her Enbrel injections.  Patient states that a prescription needs to be faxed or call in the prescription to [phone]

## 2017-03-02 NOTE — Telephone Encounter (Signed)
Last visit: 02/19/17 Next Visit: 07/19/17 Labs: 12/11/16 Anemia and rest of labs stable TB Gold: 06/21/16 Neg  Okay to refill per Dr. Estanislado Pandy  Will fax prescription to [facility].

## 2017-03-14 ENCOUNTER — Other Ambulatory Visit: Payer: Self-pay

## 2017-03-14 DIAGNOSIS — Z79899 Other long term (current) drug therapy: Secondary | ICD-10-CM

## 2017-03-14 LAB — CBC WITH DIFFERENTIAL/PLATELET
Basophils Absolute: 28 {cells}/uL (ref 0–200)
Basophils Relative: 0.6 %
Eosinophils Absolute: 122 {cells}/uL (ref 15–500)
Eosinophils Relative: 2.6 %
HCT: 34.1 % — ABNORMAL LOW (ref 35.0–45.0)
Hemoglobin: 11.3 g/dL — ABNORMAL LOW (ref 11.7–15.5)
Lymphs Abs: 1391 {cells}/uL (ref 850–3900)
MCH: 27.2 pg (ref 27.0–33.0)
MCHC: 33.1 g/dL (ref 32.0–36.0)
MCV: 82.2 fL (ref 80.0–100.0)
MPV: 9.8 fL (ref 7.5–12.5)
Monocytes Relative: 6.7 %
Neutro Abs: 2844 {cells}/uL (ref 1500–7800)
Neutrophils Relative %: 60.5 %
Platelets: 256 Thousand/uL (ref 140–400)
RBC: 4.15 Million/uL (ref 3.80–5.10)
RDW: 14.1 % (ref 11.0–15.0)
Total Lymphocyte: 29.6 %
WBC mixed population: 315 {cells}/uL (ref 200–950)
WBC: 4.7 Thousand/uL (ref 3.8–10.8)

## 2017-03-14 LAB — COMPLETE METABOLIC PANEL WITH GFR
AG Ratio: 1.4 (calc) (ref 1.0–2.5)
ALBUMIN MSPROF: 3.9 g/dL (ref 3.6–5.1)
ALKALINE PHOSPHATASE (APISO): 58 U/L (ref 33–130)
ALT: 11 U/L (ref 6–29)
AST: 17 U/L (ref 10–35)
BUN / CREAT RATIO: 14 (calc) (ref 6–22)
BUN: 13 mg/dL (ref 7–25)
CALCIUM: 9 mg/dL (ref 8.6–10.4)
CO2: 30 mmol/L (ref 20–32)
Chloride: 103 mmol/L (ref 98–110)
Creat: 0.93 mg/dL — ABNORMAL HIGH (ref 0.60–0.88)
GFR, Est African American: 66 mL/min/{1.73_m2} (ref >=60)
GFR, Est Non African American: 57 mL/min/{1.73_m2} — ABNORMAL LOW (ref >=60)
GLUCOSE: 112 mg/dL — AB (ref 65–99)
Globulin: 2.8 g/dL (calc) (ref 1.9–3.7)
Potassium: 4 mmol/L (ref 3.5–5.3)
Sodium: 140 mmol/L (ref 135–146)
TOTAL PROTEIN: 6.7 g/dL (ref 6.1–8.1)
Total Bilirubin: 0.5 mg/dL (ref 0.2–1.2)

## 2017-03-16 NOTE — Progress Notes (Signed)
stable °

## 2017-04-12 DIAGNOSIS — Z23 Encounter for immunization: Secondary | ICD-10-CM | POA: Diagnosis not present

## 2017-04-26 DIAGNOSIS — Z961 Presence of intraocular lens: Secondary | ICD-10-CM | POA: Diagnosis not present

## 2017-04-26 DIAGNOSIS — H43823 Vitreomacular adhesion, bilateral: Secondary | ICD-10-CM | POA: Diagnosis not present

## 2017-04-26 DIAGNOSIS — Z79899 Other long term (current) drug therapy: Secondary | ICD-10-CM | POA: Diagnosis not present

## 2017-04-26 DIAGNOSIS — H04123 Dry eye syndrome of bilateral lacrimal glands: Secondary | ICD-10-CM | POA: Diagnosis not present

## 2017-04-26 DIAGNOSIS — H26493 Other secondary cataract, bilateral: Secondary | ICD-10-CM | POA: Diagnosis not present

## 2017-05-31 ENCOUNTER — Telehealth: Payer: Self-pay | Admitting: Rheumatology

## 2017-05-31 NOTE — Telephone Encounter (Signed)
A new prescription for enbrel has been faxed to [facility] today at 12:22pm. The prescription will be scanned in.

## 2017-05-31 NOTE — Telephone Encounter (Signed)
Patient states she spoke with Ebony Hail at the [facility] who told her they were waiting on her prescription of Enbrel to be sent to them.  Patient was told the prescription could be called in over the phone to [phone]

## 2017-06-11 ENCOUNTER — Other Ambulatory Visit: Payer: Self-pay

## 2017-06-11 DIAGNOSIS — Z79899 Other long term (current) drug therapy: Secondary | ICD-10-CM

## 2017-06-11 LAB — CBC WITH DIFFERENTIAL/PLATELET
BASOS ABS: 30 {cells}/uL (ref 0–200)
Basophils Relative: 0.6 %
EOS ABS: 150 {cells}/uL (ref 15–500)
Eosinophils Relative: 3 %
HEMATOCRIT: 32.3 % — AB (ref 35.0–45.0)
Hemoglobin: 10.6 g/dL — ABNORMAL LOW (ref 11.7–15.5)
LYMPHS ABS: 1555 {cells}/uL (ref 850–3900)
MCH: 26.6 pg — AB (ref 27.0–33.0)
MCHC: 32.8 g/dL (ref 32.0–36.0)
MCV: 81.2 fL (ref 80.0–100.0)
MPV: 9.8 fL (ref 7.5–12.5)
Monocytes Relative: 11.6 %
NEUTROS PCT: 53.7 %
Neutro Abs: 2685 cells/uL (ref 1500–7800)
PLATELETS: 246 10*3/uL (ref 140–400)
RBC: 3.98 10*6/uL (ref 3.80–5.10)
RDW: 14 % (ref 11.0–15.0)
TOTAL LYMPHOCYTE: 31.1 %
WBC: 5 10*3/uL (ref 3.8–10.8)
WBCMIX: 580 {cells}/uL (ref 200–950)

## 2017-06-12 LAB — COMPLETE METABOLIC PANEL WITH GFR
AG Ratio: 1.4 (calc) (ref 1.0–2.5)
ALKALINE PHOSPHATASE (APISO): 60 U/L (ref 33–130)
ALT: 9 U/L (ref 6–29)
AST: 17 U/L (ref 10–35)
Albumin: 4.1 g/dL (ref 3.6–5.1)
BILIRUBIN TOTAL: 0.4 mg/dL (ref 0.2–1.2)
BUN: 18 mg/dL (ref 7–25)
CHLORIDE: 102 mmol/L (ref 98–110)
CO2: 30 mmol/L (ref 20–32)
Calcium: 8.8 mg/dL (ref 8.6–10.4)
Creat: 0.82 mg/dL (ref 0.60–0.88)
GFR, Est African American: 77 mL/min/{1.73_m2} (ref >=60)
GFR, Est Non African American: 66 mL/min/{1.73_m2} (ref >=60)
GLUCOSE: 81 mg/dL (ref 65–99)
Globulin: 2.9 g/dL (calc) (ref 1.9–3.7)
Potassium: 4.1 mmol/L (ref 3.5–5.3)
Sodium: 138 mmol/L (ref 135–146)
Total Protein: 7 g/dL (ref 6.1–8.1)

## 2017-06-12 NOTE — Progress Notes (Signed)
Labs are stable.  Hemoglobin is low .

## 2017-06-13 ENCOUNTER — Telehealth: Payer: Self-pay | Admitting: Rheumatology

## 2017-06-13 NOTE — Telephone Encounter (Signed)
Patient called stating she was returning your call regarding her labwork.   °

## 2017-06-14 NOTE — Telephone Encounter (Signed)
Patient advised of lab results and copy sent to PCP.  

## 2017-06-23 ENCOUNTER — Other Ambulatory Visit: Payer: Self-pay | Admitting: Physician Assistant

## 2017-06-25 NOTE — Telephone Encounter (Signed)
Last Visit: 02/19/17 Next visit: 07/19/17 Labs: 06/11/17 Labs are stable. Hemoglobin is low . eye exam normal 10/25/16  Okay to refill per Dr. Estanislado Pandy
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
## 2017-07-15 DIAGNOSIS — J029 Acute pharyngitis, unspecified: Secondary | ICD-10-CM | POA: Diagnosis not present

## 2017-07-19 ENCOUNTER — Ambulatory Visit: Payer: Medicare Other | Admitting: Physician Assistant
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
## 2017-07-23 DIAGNOSIS — W57XXXA Bitten or stung by nonvenomous insect and other nonvenomous arthropods, initial encounter: Secondary | ICD-10-CM | POA: Diagnosis not present

## 2017-07-23 DIAGNOSIS — Z1389 Encounter for screening for other disorder: Secondary | ICD-10-CM | POA: Diagnosis not present

## 2017-07-23 DIAGNOSIS — J029 Acute pharyngitis, unspecified: Secondary | ICD-10-CM | POA: Diagnosis not present

## 2017-07-30 ENCOUNTER — Encounter: Payer: Self-pay | Admitting: Physician Assistant

## 2017-07-30 ENCOUNTER — Ambulatory Visit: Payer: Medicare Other | Admitting: Physician Assistant

## 2017-07-30 VITALS — BP 140/71 | HR 58 | Resp 14 | Ht 67.0 in | Wt 160.0 lb

## 2017-07-30 DIAGNOSIS — M170 Bilateral primary osteoarthritis of knee: Secondary | ICD-10-CM

## 2017-07-30 DIAGNOSIS — Z8659 Personal history of other mental and behavioral disorders: Secondary | ICD-10-CM

## 2017-07-30 DIAGNOSIS — M19042 Primary osteoarthritis, left hand: Secondary | ICD-10-CM

## 2017-07-30 DIAGNOSIS — L853 Xerosis cutis: Secondary | ICD-10-CM

## 2017-07-30 DIAGNOSIS — M0609 Rheumatoid arthritis without rheumatoid factor, multiple sites: Secondary | ICD-10-CM

## 2017-07-30 DIAGNOSIS — Z79899 Other long term (current) drug therapy: Secondary | ICD-10-CM | POA: Diagnosis not present

## 2017-07-30 DIAGNOSIS — G5702 Lesion of sciatic nerve, left lower limb: Secondary | ICD-10-CM

## 2017-07-30 DIAGNOSIS — I10 Essential (primary) hypertension: Secondary | ICD-10-CM

## 2017-07-30 DIAGNOSIS — M19041 Primary osteoarthritis, right hand: Secondary | ICD-10-CM

## 2017-07-30 DIAGNOSIS — M19071 Primary osteoarthritis, right ankle and foot: Secondary | ICD-10-CM | POA: Diagnosis not present

## 2017-07-30 DIAGNOSIS — M19072 Primary osteoarthritis, left ankle and foot: Secondary | ICD-10-CM

## 2017-07-30 DIAGNOSIS — Z8679 Personal history of other diseases of the circulatory system: Secondary | ICD-10-CM

## 2017-07-30 DIAGNOSIS — Z8639 Personal history of other endocrine, nutritional and metabolic disease: Secondary | ICD-10-CM

## 2017-07-30 NOTE — Patient Instructions (Signed)
Standing Labs We placed an order today for your standing lab work.    Please come back and get your standing labs in September and every 3 months   We have open lab Monday through Friday from 8:30-11:30 AM and 1:30-4:00 PM  at the office of Dr. Shaili Deveshwar.   You may experience shorter wait times on Monday and Friday afternoons. The office is located at [address], [address], [place], [place] [address] No appointment is necessary.   Labs are drawn by [facility].  You may receive a bill from [facility] for your lab work. If you have any questions regarding directions or hours of operation,  please call [phone].    

## 2017-08-07 DIAGNOSIS — R7301 Impaired fasting glucose: Secondary | ICD-10-CM | POA: Diagnosis not present

## 2017-08-07 DIAGNOSIS — E039 Hypothyroidism, unspecified: Secondary | ICD-10-CM | POA: Diagnosis not present

## 2017-08-14 DIAGNOSIS — R7301 Impaired fasting glucose: Secondary | ICD-10-CM | POA: Diagnosis not present

## 2017-08-14 DIAGNOSIS — R42 Dizziness and giddiness: Secondary | ICD-10-CM | POA: Diagnosis not present

## 2017-08-14 DIAGNOSIS — E039 Hypothyroidism, unspecified: Secondary | ICD-10-CM | POA: Diagnosis not present

## 2017-08-14 DIAGNOSIS — I10 Essential (primary) hypertension: Secondary | ICD-10-CM | POA: Diagnosis not present

## 2017-08-14 DIAGNOSIS — E049 Nontoxic goiter, unspecified: Secondary | ICD-10-CM | POA: Diagnosis not present

## 2017-08-15 DIAGNOSIS — M069 Rheumatoid arthritis, unspecified: Secondary | ICD-10-CM | POA: Diagnosis not present

## 2017-08-15 DIAGNOSIS — I10 Essential (primary) hypertension: Secondary | ICD-10-CM | POA: Diagnosis not present

## 2017-08-15 DIAGNOSIS — E782 Mixed hyperlipidemia: Secondary | ICD-10-CM | POA: Diagnosis not present

## 2017-08-15 DIAGNOSIS — E039 Hypothyroidism, unspecified: Secondary | ICD-10-CM | POA: Diagnosis not present

## 2017-08-29 ENCOUNTER — Telehealth: Payer: Self-pay | Admitting: Rheumatology

## 2017-08-29 NOTE — Telephone Encounter (Signed)
Last visit: 07/30/17 Next Visit: 12/26/17 Labs: 06/11/17 Labs are stable. Hemoglobin is low . TB Gold: 06/26/16 Neg   Okay to refill per Dr. Estanislado Pandy  Faxed to [employer]

## 2017-08-29 NOTE — Telephone Encounter (Signed)
Patient called stating that she just spoke with someone from [employer] and they need a new prescription for Enbrel.   Patient states they will be sending a fax to our office or Dr. Estanislado Pandy can call in the prescription.

## 2017-09-12 DIAGNOSIS — L299 Pruritus, unspecified: Secondary | ICD-10-CM | POA: Diagnosis not present

## 2017-09-12 DIAGNOSIS — H8110 Benign paroxysmal vertigo, unspecified ear: Secondary | ICD-10-CM | POA: Diagnosis not present

## 2017-09-20 ENCOUNTER — Other Ambulatory Visit: Payer: Self-pay | Admitting: *Deleted

## 2017-09-20 MED ORDER — HYDROXYCHLOROQUINE SULFATE 200 MG PO TABS: 200.0000 mg | ORAL_TABLET | Freq: Two times a day (BID) | ORAL | 0 refills | Status: DC

## 2017-09-20 NOTE — Telephone Encounter (Addendum)
Refill request received via fax  Last visit: 07/30/17 Next Visit: 12/26/17 Labs: 06/11/17 Labs are stable. Hemoglobin is low . PLQ Eye Exam: per patient approx. July 2019 WNL Will have results faxed.   Okay to refill per Dr. Estanislado Pandy

## 2017-09-24 ENCOUNTER — Telehealth: Payer: Self-pay | Admitting: *Deleted

## 2017-09-24 DIAGNOSIS — R350 Frequency of micturition: Secondary | ICD-10-CM | POA: Diagnosis not present

## 2017-09-24 DIAGNOSIS — Z23 Encounter for immunization: Secondary | ICD-10-CM | POA: Diagnosis not present

## 2017-09-24 DIAGNOSIS — N342 Other urethritis: Secondary | ICD-10-CM | POA: Diagnosis not present

## 2017-09-24 NOTE — Telephone Encounter (Signed)
Patient states she went to see PCP. Patient was placed on an antibiotic for 7 days for UTI. Patient is due to take her Enbrel injection today. Advised patient to hold Enbrel until she has finished her antibiotic and the UTI has cleared.

## 2017-10-03 ENCOUNTER — Telehealth: Payer: Self-pay | Admitting: Pharmacy Technician

## 2017-10-03 ENCOUNTER — Other Ambulatory Visit: Payer: Self-pay

## 2017-10-03 DIAGNOSIS — Z79899 Other long term (current) drug therapy: Secondary | ICD-10-CM

## 2017-10-03 NOTE — Telephone Encounter (Signed)
Patient came in a received a renewal application for Enbrel Patient Assistance. Will fill out and mail back to office with income documents.  10:41 AM Beatriz Chancellor, CPhT

## 2017-10-04 NOTE — Progress Notes (Signed)
Elevated creatinine most likely due to diuretic use.  She continues to be anemic.  Please forward labs to her PCP.  Please advise patient to discuss anemia with her PCP.

## 2017-10-06 LAB — COMPLETE METABOLIC PANEL WITH GFR
AG RATIO: 1.5 (calc) (ref 1.0–2.5)
ALBUMIN MSPROF: 4 g/dL (ref 3.6–5.1)
ALT: 10 U/L (ref 6–29)
AST: 15 U/L (ref 10–35)
Alkaline phosphatase (APISO): 54 U/L (ref 33–130)
BUN / CREAT RATIO: 14 (calc) (ref 6–22)
BUN: 13 mg/dL (ref 7–25)
CO2: 30 mmol/L (ref 20–32)
CREATININE: 0.95 mg/dL — AB (ref 0.60–0.88)
Calcium: 8.7 mg/dL (ref 8.6–10.4)
Chloride: 103 mmol/L (ref 98–110)
GFR, EST AFRICAN AMERICAN: 64 mL/min/{1.73_m2} (ref >=60)
GFR, EST NON AFRICAN AMERICAN: 55 mL/min/{1.73_m2} — AB (ref >=60)
GLOBULIN: 2.6 g/dL (ref 1.9–3.7)
Glucose, Bld: 87 mg/dL (ref 65–99)
Potassium: 4.2 mmol/L (ref 3.5–5.3)
Sodium: 139 mmol/L (ref 135–146)
Total Bilirubin: 0.4 mg/dL (ref 0.2–1.2)
Total Protein: 6.6 g/dL (ref 6.1–8.1)

## 2017-10-06 LAB — CBC WITH DIFFERENTIAL/PLATELET
BASOS PCT: 0.6 %
Basophils Absolute: 28 cells/uL (ref 0–200)
EOS ABS: 141 {cells}/uL (ref 15–500)
Eosinophils Relative: 3 %
HCT: 32.3 % — ABNORMAL LOW (ref 35.0–45.0)
HEMOGLOBIN: 10.3 g/dL — AB (ref 11.7–15.5)
LYMPHS ABS: 1504 {cells}/uL (ref 850–3900)
MCH: 26.3 pg — AB (ref 27.0–33.0)
MCHC: 31.9 g/dL — ABNORMAL LOW (ref 32.0–36.0)
MCV: 82.6 fL (ref 80.0–100.0)
MPV: 9.7 fL (ref 7.5–12.5)
Monocytes Relative: 8.3 %
NEUTROS ABS: 2637 {cells}/uL (ref 1500–7800)
Neutrophils Relative %: 56.1 %
PLATELETS: 264 10*3/uL (ref 140–400)
RBC: 3.91 10*6/uL (ref 3.80–5.10)
RDW: 14.8 % (ref 11.0–15.0)
Total Lymphocyte: 32 %
WBC: 4.7 10*3/uL (ref 3.8–10.8)
WBCMIX: 390 {cells}/uL (ref 200–950)

## 2017-10-06 LAB — QUANTIFERON-TB GOLD PLUS
Mitogen-NIL: >10 IU/mL
NIL: 0.02 IU/mL
QUANTIFERON-TB GOLD PLUS: NEGATIVE
TB1-NIL: 0 [IU]/mL
TB2-NIL: 0 [IU]/mL

## 2017-10-08 NOTE — Progress Notes (Signed)
TB gold negative

## 2017-10-15 DIAGNOSIS — R350 Frequency of micturition: Secondary | ICD-10-CM | POA: Diagnosis not present

## 2017-10-15 DIAGNOSIS — M2550 Pain in unspecified joint: Secondary | ICD-10-CM | POA: Diagnosis not present

## 2017-10-15 DIAGNOSIS — R3129 Other microscopic hematuria: Secondary | ICD-10-CM | POA: Diagnosis not present

## 2017-10-15 DIAGNOSIS — Z1389 Encounter for screening for other disorder: Secondary | ICD-10-CM | POA: Diagnosis not present

## 2017-10-15 DIAGNOSIS — Z0001 Encounter for general adult medical examination with abnormal findings: Secondary | ICD-10-CM | POA: Diagnosis not present

## 2017-10-15 DIAGNOSIS — M069 Rheumatoid arthritis, unspecified: Secondary | ICD-10-CM | POA: Diagnosis not present

## 2017-10-15 NOTE — Telephone Encounter (Signed)
Received patient assistance renewal application for [id number], faxed to [employer].  9:29 AM Beatriz Chancellor, CPhT

## 2017-10-31 NOTE — Telephone Encounter (Signed)
Received fax from [employer], renewal application has been APPROVED. Coverage is from 01/09/18 to 01/09/19.  Will send document to scan Center.  Phone# [phone] Fax# [phone]  10:27 AM Beatriz Chancellor, CPhT

## 2017-11-06 DIAGNOSIS — R710 Precipitous drop in hematocrit: Secondary | ICD-10-CM | POA: Diagnosis not present

## 2017-11-07 DIAGNOSIS — Z961 Presence of intraocular lens: Secondary | ICD-10-CM | POA: Diagnosis not present

## 2017-11-07 DIAGNOSIS — H43823 Vitreomacular adhesion, bilateral: Secondary | ICD-10-CM | POA: Diagnosis not present

## 2017-11-07 DIAGNOSIS — Z79899 Other long term (current) drug therapy: Secondary | ICD-10-CM | POA: Diagnosis not present

## 2017-11-07 DIAGNOSIS — H04123 Dry eye syndrome of bilateral lacrimal glands: Secondary | ICD-10-CM | POA: Diagnosis not present

## 2017-11-07 DIAGNOSIS — H26492 Other secondary cataract, left eye: Secondary | ICD-10-CM | POA: Diagnosis not present

## 2017-11-09 DIAGNOSIS — R42 Dizziness and giddiness: Secondary | ICD-10-CM | POA: Diagnosis not present

## 2017-11-09 DIAGNOSIS — Z1211 Encounter for screening for malignant neoplasm of colon: Secondary | ICD-10-CM | POA: Diagnosis not present

## 2017-11-09 DIAGNOSIS — H903 Sensorineural hearing loss, bilateral: Secondary | ICD-10-CM | POA: Diagnosis not present

## 2017-11-15 ENCOUNTER — Telehealth: Payer: Self-pay | Admitting: Rheumatology

## 2017-11-15 NOTE — Telephone Encounter (Signed)
Patient has seen Dr. Hilma Favors twice now in reference to low Hemoglobin. Per patient Dr. Hilma Favors referred patient back to Dr. Estanislado Pandy due to Enbrel. Please call patient to advise.

## 2017-11-19 ENCOUNTER — Telehealth: Payer: Self-pay | Admitting: Rheumatology

## 2017-11-19 NOTE — Telephone Encounter (Signed)
Attempted to contact the patient and left message for patient to call the office.  

## 2017-11-19 NOTE — Telephone Encounter (Signed)
Patient returning your call. Please call patient at home #.

## 2017-11-20 NOTE — Telephone Encounter (Signed)
Patient states she has seen Dr. Hilma Favors twice for the Hgb being low. Patient states she was advised to contact our office. Patient advised that we had referred her to have PCP treat the anemia as we are aware she is on Enbrel and the anemia would not be due to that. Patient will contact PCP's office for treatment.

## 2017-11-27 DIAGNOSIS — R55 Syncope and collapse: Secondary | ICD-10-CM | POA: Diagnosis not present

## 2017-11-27 DIAGNOSIS — R42 Dizziness and giddiness: Secondary | ICD-10-CM | POA: Diagnosis not present

## 2017-11-28 ENCOUNTER — Telehealth: Payer: Self-pay | Admitting: Rheumatology

## 2017-11-28 NOTE — Telephone Encounter (Signed)
Last visit: 07/30/17 Next Visit: 12/26/17 Labs: 10/03/17 Elevated creatinine most likely due to diuretic use. She continues to be anemic TB Gold: 10/03/17 Neg  Okay to refill per Dr. Estanislado Pandy

## 2017-11-28 NOTE — Telephone Encounter (Signed)
Patient called stating she received a call from [employer] who told her they need a form completed from Dr. Arlean Hopping office before they will be able to refill her prescription of Enbrel.  Patient states she only has one injection remaining and takes it every week.  Patient requested a return call.

## 2017-11-28 NOTE — Telephone Encounter (Signed)
Left message to advise patient prescription has been faxed.

## 2017-12-10 ENCOUNTER — Telehealth: Payer: Self-pay | Admitting: Rheumatology

## 2017-12-10 NOTE — Telephone Encounter (Signed)
Patient called stating [employer] does not have a valid rx for her Enbrel, and needs one before they will send out her medication. Patient is out, and due for next dose today. Patient was advised, per Seth Bake to come in for a sample to take today. rx will be refaxed to [facility] for her.

## 2017-12-10 NOTE — Telephone Encounter (Signed)
Prescription was faxed in on 11/28/17 and has been re-faxed today. Patient came by the office for a sample of Enbrel.  Medication Samples have been provided to the patient.  Drug name: Enbrel      Strength: 50 mg        Qty: 1  LOT: [id number]  Exp.Date: 06/2019  Dosing instructions: Inject one pen into skin once weekly.  The patient has been instructed regarding the correct time, dose, and frequency of taking this medication, including desired effects and most common side effects.   Gwenlyn Perking 3:33 PM 12/10/2017

## 2017-12-12 ENCOUNTER — Telehealth: Payer: Self-pay | Admitting: Pharmacy Technician

## 2017-12-12 NOTE — Telephone Encounter (Signed)
Received a fax from [employer] regarding a prior authorization for Enbrel. Authorization has been APPROVED from 12/12/17 to 01/09/19.   Will send document to scan center.  Authorization # [id number] Phone # [phone]  1:20 PM Beatriz Chancellor, CPhT

## 2017-12-12 NOTE — Telephone Encounter (Signed)
Received a Prior Authorization request from [employer] for Enbrel. Authorization has been submitted to patient's insurance via Cover My Meds. Will update once we receive a response.  9:49 AM Beatriz Chancellor, CPhT

## 2017-12-16 ENCOUNTER — Other Ambulatory Visit: Payer: Self-pay | Admitting: Rheumatology
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
## 2017-12-17 NOTE — Telephone Encounter (Signed)
Last visit: 07/30/17 Next Visit: 12/26/17 Labs: 10/03/17 Elevated creatinine most likely due to diuretic use. She continues to be anemic. PLQ Eye Exam: per patient approx. July 2019 WNL  Okay to refill per Dr.Deveshwar

## 2017-12-18 ENCOUNTER — Ambulatory Visit: Payer: Medicare Other | Admitting: Physician Assistant

## 2017-12-23 ENCOUNTER — Encounter (HOSPITAL_COMMUNITY): Payer: Self-pay

## 2017-12-23 ENCOUNTER — Ambulatory Visit (HOSPITAL_COMMUNITY)
Admission: EM | Admit: 2017-12-23 | Discharge: 2017-12-23 | Disposition: A | Discharge status: Home / Self Care | Payer: Medicare Other | Attending: Emergency Medicine | Admitting: Emergency Medicine

## 2017-12-23 ENCOUNTER — Other Ambulatory Visit: Payer: Self-pay

## 2017-12-23 DIAGNOSIS — J309 Allergic rhinitis, unspecified: Secondary | ICD-10-CM

## 2017-12-23 MED ORDER — PREDNISONE 20 MG PO TABS: 40.0000 mg | ORAL_TABLET | Freq: Every day | ORAL | 0 refills | Status: AC

## 2017-12-23 MED ORDER — FLUTICASONE PROPIONATE 50 MCG/ACT NA SUSP: 1.0000 | Freq: Every day | NASAL | 0 refills | Status: DC

## 2017-12-23 NOTE — Discharge Instructions (Signed)
Please continue daily Zyrtec/cetirizine Please add in Flonase nasal spray 1 to 2 sprays in each nostril daily to help with congestion and ear discomfort Begin prednisone course 40 mg daily for the next 5 days, please take with food and in morning if you are able Drink plenty of fluids Continue with eyedrops you are currently taking  Follow-up if symptoms not resolving as would expected in the next 4 to 5 days with adding in the above, developing fever, shortness of breath, difficulty breathing,fatigue and weakness
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
## 2017-12-23 NOTE — ED Triage Notes (Signed)
Pt presents today with headache, bilateral ear ache, watery eyes and sore throat since yesterday. Thinks she had a possible fever last night because she was hot and cold.

## 2017-12-26 ENCOUNTER — Ambulatory Visit: Payer: Medicare Other | Admitting: Physician Assistant

## 2017-12-27 DIAGNOSIS — J069 Acute upper respiratory infection, unspecified: Secondary | ICD-10-CM | POA: Diagnosis not present

## 2017-12-27 DIAGNOSIS — G894 Chronic pain syndrome: Secondary | ICD-10-CM | POA: Diagnosis not present

## 2017-12-31 ENCOUNTER — Ambulatory Visit: Payer: Medicare Other | Admitting: Physician Assistant

## 2017-12-31 ENCOUNTER — Encounter: Payer: Self-pay | Admitting: Physician Assistant

## 2017-12-31 VITALS — BP 163/71 | HR 64 | Resp 13 | Ht 67.0 in | Wt 157.8 lb

## 2017-12-31 DIAGNOSIS — I10 Essential (primary) hypertension: Secondary | ICD-10-CM

## 2017-12-31 DIAGNOSIS — Z79899 Other long term (current) drug therapy: Secondary | ICD-10-CM | POA: Diagnosis not present

## 2017-12-31 DIAGNOSIS — M0609 Rheumatoid arthritis without rheumatoid factor, multiple sites: Secondary | ICD-10-CM | POA: Diagnosis not present

## 2017-12-31 DIAGNOSIS — Z8639 Personal history of other endocrine, nutritional and metabolic disease: Secondary | ICD-10-CM

## 2017-12-31 DIAGNOSIS — L853 Xerosis cutis: Secondary | ICD-10-CM

## 2017-12-31 DIAGNOSIS — M170 Bilateral primary osteoarthritis of knee: Secondary | ICD-10-CM

## 2017-12-31 DIAGNOSIS — M19071 Primary osteoarthritis, right ankle and foot: Secondary | ICD-10-CM | POA: Diagnosis not present

## 2017-12-31 DIAGNOSIS — Z8659 Personal history of other mental and behavioral disorders: Secondary | ICD-10-CM

## 2017-12-31 DIAGNOSIS — G5702 Lesion of sciatic nerve, left lower limb: Secondary | ICD-10-CM

## 2017-12-31 DIAGNOSIS — M19041 Primary osteoarthritis, right hand: Secondary | ICD-10-CM

## 2017-12-31 DIAGNOSIS — M19072 Primary osteoarthritis, left ankle and foot: Secondary | ICD-10-CM

## 2017-12-31 DIAGNOSIS — M19042 Primary osteoarthritis, left hand: Secondary | ICD-10-CM

## 2017-12-31 NOTE — Patient Instructions (Signed)
Standing Labs We placed an order today for your standing lab work.    Please come back and get your standing labs in March and every 3 months  We have open lab Monday through Friday from 8:30-11:30 AM and 1:30-4:00 PM  at the office of Dr. Shaili Deveshwar.   You may experience shorter wait times on Monday and Friday afternoons. The office is located at [address], [address], [place], [place] [address] No appointment is necessary.   Labs are drawn by [facility].  You may receive a bill from [facility] for your lab work.  If you wish to have your labs drawn at another location, please call the office 24 hours in advance to send orders.  If you have any questions regarding directions or hours of operation,  please call [phone].   Just as a reminder please drink plenty of water prior to coming for your lab work. Thanks!  

## 2018-01-01 LAB — COMPLETE METABOLIC PANEL WITH GFR
AG Ratio: 1.4 (calc) (ref 1.0–2.5)
ALBUMIN MSPROF: 4.1 g/dL (ref 3.6–5.1)
ALT: 12 U/L (ref 6–29)
AST: 15 U/L (ref 10–35)
Alkaline phosphatase (APISO): 64 U/L (ref 33–130)
BUN: 17 mg/dL (ref 7–25)
CO2: 29 mmol/L (ref 20–32)
Calcium: 9 mg/dL (ref 8.6–10.4)
Chloride: 104 mmol/L (ref 98–110)
Creat: 0.77 mg/dL (ref 0.60–0.88)
GFR, EST AFRICAN AMERICAN: 83 mL/min/{1.73_m2} (ref >=60)
GFR, Est Non African American: 71 mL/min/{1.73_m2} (ref >=60)
GLOBULIN: 3 g/dL (ref 1.9–3.7)
Glucose, Bld: 78 mg/dL (ref 65–99)
Potassium: 4.5 mmol/L (ref 3.5–5.3)
Sodium: 138 mmol/L (ref 135–146)
Total Bilirubin: 0.5 mg/dL (ref 0.2–1.2)
Total Protein: 7.1 g/dL (ref 6.1–8.1)

## 2018-01-01 LAB — CBC WITH DIFFERENTIAL/PLATELET
Absolute Monocytes: 800 cells/uL (ref 200–950)
BASOS ABS: 72 {cells}/uL (ref 0–200)
Basophils Relative: 0.9 %
Eosinophils Absolute: 176 cells/uL (ref 15–500)
Eosinophils Relative: 2.2 %
HCT: 35.9 % (ref 35.0–45.0)
Hemoglobin: 11.1 g/dL — ABNORMAL LOW (ref 11.7–15.5)
Lymphs Abs: 2608 cells/uL (ref 850–3900)
MCH: 24.3 pg — ABNORMAL LOW (ref 27.0–33.0)
MCHC: 30.9 g/dL — ABNORMAL LOW (ref 32.0–36.0)
MCV: 78.7 fL — AB (ref 80.0–100.0)
MPV: 10.1 fL (ref 7.5–12.5)
Monocytes Relative: 10 %
Neutro Abs: 4344 cells/uL (ref 1500–7800)
Neutrophils Relative %: 54.3 %
Platelets: 364 10*3/uL (ref 140–400)
RBC: 4.56 10*6/uL (ref 3.80–5.10)
RDW: 15.1 % — ABNORMAL HIGH (ref 11.0–15.0)
Total Lymphocyte: 32.6 %
WBC: 8 10*3/uL (ref 3.8–10.8)

## 2018-01-03 NOTE — Progress Notes (Signed)
Anemia has improved.  CMP WNL. Please forward labs to PCP.  We will continue to monitor labs every 3 months.

## 2018-01-10 ENCOUNTER — Other Ambulatory Visit (HOSPITAL_COMMUNITY): Payer: Self-pay | Admitting: Family Medicine

## 2018-01-10 DIAGNOSIS — Z1231 Encounter for screening mammogram for malignant neoplasm of breast: Secondary | ICD-10-CM

## 2018-02-18 DIAGNOSIS — M069 Rheumatoid arthritis, unspecified: Secondary | ICD-10-CM | POA: Diagnosis not present

## 2018-02-18 DIAGNOSIS — E7849 Other hyperlipidemia: Secondary | ICD-10-CM | POA: Diagnosis not present

## 2018-02-18 DIAGNOSIS — Z0001 Encounter for general adult medical examination with abnormal findings: Secondary | ICD-10-CM | POA: Diagnosis not present

## 2018-02-18 DIAGNOSIS — I10 Essential (primary) hypertension: Secondary | ICD-10-CM | POA: Diagnosis not present

## 2018-02-18 DIAGNOSIS — Z1389 Encounter for screening for other disorder: Secondary | ICD-10-CM | POA: Diagnosis not present

## 2018-02-18 DIAGNOSIS — R7309 Other abnormal glucose: Secondary | ICD-10-CM | POA: Diagnosis not present

## 2018-02-20 ENCOUNTER — Ambulatory Visit (HOSPITAL_COMMUNITY)
Admission: RE | Admit: 2018-02-20 | Discharge: 2018-02-20 | Disposition: A | Discharge status: Home / Self Care | Payer: Medicare Other | Source: Ambulatory Visit | Attending: Family Medicine | Admitting: Family Medicine

## 2018-02-20 ENCOUNTER — Encounter (HOSPITAL_COMMUNITY): Payer: Self-pay

## 2018-02-20 DIAGNOSIS — Z1231 Encounter for screening mammogram for malignant neoplasm of breast: Secondary | ICD-10-CM | POA: Insufficient documentation

## 2018-03-12 ENCOUNTER — Telehealth: Payer: Self-pay | Admitting: Rheumatology

## 2018-03-12 MED ORDER — ETANERCEPT 50 MG/ML ~~LOC~~ SOAJ: SUBCUTANEOUS | 0 refills | Status: DC

## 2018-03-12 NOTE — Telephone Encounter (Signed)
Patient called stating she received a call from [employer] who said they need a new prescription from Dr. Estanislado Schmidt before they could refill her prescription of Enbrel.  Patient was told by [employer] that they faxed our office last week requesting that we fill out the form with the missing information.

## 2018-03-12 NOTE — Telephone Encounter (Signed)
last Visit: 12/31/17 Next visit: 06/04/18 Labs: 12/31/17 Anemia has improved. CMP WNL. Tb Gold: 10/03/17 Neg   Okay to refill per Dr. Estanislado Pandy  Faxed prescription back to [facility]. Patient advised we just received prescription refill request for the first time this morning. And the prescription has been faxed.

## 2018-03-31 ENCOUNTER — Other Ambulatory Visit: Payer: Self-pay | Admitting: Rheumatology

## 2018-04-01 NOTE — Telephone Encounter (Signed)
Last Visit: 12/31/2017 Next Visit: 06/04/2018 Labs: 12/31/2017 Anemia has improved. CMP WNL Eye exam: 10/25/2016  Advised patient we need updated PLQ eye exam.   Okay to refill per Dr. Estanislado Pandy.

## 2018-04-04 ENCOUNTER — Other Ambulatory Visit: Payer: Self-pay

## 2018-04-04 DIAGNOSIS — Z79899 Other long term (current) drug therapy: Secondary | ICD-10-CM

## 2018-04-04 LAB — COMPLETE METABOLIC PANEL WITH GFR
AG Ratio: 1.5 (calc) (ref 1.0–2.5)
ALT: 10 U/L (ref 6–29)
AST: 17 U/L (ref 10–35)
Albumin: 4.2 g/dL (ref 3.6–5.1)
Alkaline phosphatase (APISO): 55 U/L (ref 37–153)
BILIRUBIN TOTAL: 0.5 mg/dL (ref 0.2–1.2)
BUN / CREAT RATIO: 24 (calc) — AB (ref 6–22)
BUN: 21 mg/dL (ref 7–25)
CO2: 28 mmol/L (ref 20–32)
Calcium: 9.1 mg/dL (ref 8.6–10.4)
Chloride: 103 mmol/L (ref 98–110)
Creat: 0.89 mg/dL — ABNORMAL HIGH (ref 0.60–0.88)
GFR, EST AFRICAN AMERICAN: 69 mL/min/{1.73_m2} (ref >=60)
GFR, Est Non African American: 60 mL/min/{1.73_m2} (ref >=60)
Globulin: 2.8 g/dL (calc) (ref 1.9–3.7)
Glucose, Bld: 89 mg/dL (ref 65–99)
Potassium: 4.3 mmol/L (ref 3.5–5.3)
Sodium: 139 mmol/L (ref 135–146)
TOTAL PROTEIN: 7 g/dL (ref 6.1–8.1)

## 2018-04-04 LAB — CBC WITH DIFFERENTIAL/PLATELET
ABSOLUTE MONOCYTES: 560 {cells}/uL (ref 200–950)
Basophils Absolute: 40 cells/uL (ref 0–200)
Basophils Relative: 0.8 %
Eosinophils Absolute: 150 cells/uL (ref 15–500)
Eosinophils Relative: 3 %
HCT: 31.3 % — ABNORMAL LOW (ref 35.0–45.0)
Hemoglobin: 9.9 g/dL — ABNORMAL LOW (ref 11.7–15.5)
Lymphs Abs: 1545 cells/uL (ref 850–3900)
MCH: 24.8 pg — AB (ref 27.0–33.0)
MCHC: 31.6 g/dL — AB (ref 32.0–36.0)
MCV: 78.3 fL — AB (ref 80.0–100.0)
MPV: 9.7 fL (ref 7.5–12.5)
Monocytes Relative: 11.2 %
NEUTROS PCT: 54.1 %
Neutro Abs: 2705 cells/uL (ref 1500–7800)
Platelets: 255 10*3/uL (ref 140–400)
RBC: 4 10*6/uL (ref 3.80–5.10)
RDW: 14.6 % (ref 11.0–15.0)
Total Lymphocyte: 30.9 %
WBC: 5 10*3/uL (ref 3.8–10.8)
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
## 2018-06-04 ENCOUNTER — Encounter: Payer: Self-pay | Admitting: Physician Assistant

## 2018-06-04 ENCOUNTER — Ambulatory Visit: Payer: Medicare Other | Admitting: Physician Assistant

## 2018-06-04 ENCOUNTER — Other Ambulatory Visit: Payer: Self-pay

## 2018-06-04 VITALS — BP 141/70 | HR 65 | Resp 12 | Ht 67.0 in | Wt 160.4 lb

## 2018-06-04 DIAGNOSIS — Z8679 Personal history of other diseases of the circulatory system: Secondary | ICD-10-CM

## 2018-06-04 DIAGNOSIS — Z79899 Other long term (current) drug therapy: Secondary | ICD-10-CM | POA: Diagnosis not present

## 2018-06-04 DIAGNOSIS — M19071 Primary osteoarthritis, right ankle and foot: Secondary | ICD-10-CM | POA: Diagnosis not present

## 2018-06-04 DIAGNOSIS — G5702 Lesion of sciatic nerve, left lower limb: Secondary | ICD-10-CM

## 2018-06-04 DIAGNOSIS — Z8639 Personal history of other endocrine, nutritional and metabolic disease: Secondary | ICD-10-CM

## 2018-06-04 DIAGNOSIS — M19041 Primary osteoarthritis, right hand: Secondary | ICD-10-CM

## 2018-06-04 DIAGNOSIS — M19042 Primary osteoarthritis, left hand: Secondary | ICD-10-CM

## 2018-06-04 DIAGNOSIS — M0609 Rheumatoid arthritis without rheumatoid factor, multiple sites: Secondary | ICD-10-CM | POA: Diagnosis not present

## 2018-06-04 DIAGNOSIS — M19072 Primary osteoarthritis, left ankle and foot: Secondary | ICD-10-CM

## 2018-06-04 DIAGNOSIS — M170 Bilateral primary osteoarthritis of knee: Secondary | ICD-10-CM

## 2018-06-04 DIAGNOSIS — Z8659 Personal history of other mental and behavioral disorders: Secondary | ICD-10-CM

## 2018-06-04 DIAGNOSIS — I10 Essential (primary) hypertension: Secondary | ICD-10-CM

## 2018-06-04 NOTE — Patient Instructions (Addendum)
Standing Labs We placed an order today for your standing lab work.    Please come back and get your standing labs in end of June and every 3 months   We have open lab daily Monday through Thursday from 8:30-12:30 PM and 1:30-4:30 PM and Friday from 8:30-12:30 PM and 1:30 -4:00 PM at the office of Dr. Bo Merino.   You may experience shorter wait times on Monday and Friday afternoons. The office is located at [address] [place], [place], [place] [address] No appointment is necessary.   Labs are drawn by [facility].  You may receive a bill from [facility] for your lab work.  If you wish to have your labs drawn at another location, please call the office 24 hours in advance to send orders.  If you have any questions regarding directions or hours of operation,  please call [phone].   Just as a reminder please drink plenty of water prior to coming for your lab work. Thanks!

## 2018-06-05 ENCOUNTER — Telehealth: Payer: Self-pay | Admitting: Rheumatology

## 2018-06-05 MED ORDER — ETANERCEPT 50 MG/ML ~~LOC~~ SOAJ: SUBCUTANEOUS | 0 refills | Status: DC

## 2018-06-05 NOTE — Telephone Encounter (Signed)
Last Visit: 06/04/18 Next Visit: 11/05/18 Labs: 04/04/18  Creatinine borderline elevated. GFR is WNL. Hgb and hct is trending down.   TB Gold: 10/03/17 Neg    Okay to refill per Dr. Estanislado Pandy

## 2018-06-05 NOTE — Telephone Encounter (Signed)
Patient called stating she received a call from [facility] stating they need a new prescription for her Enbrel to be sent ASAP in order for her to receive her medication next week.  Patient states they gave her the following numbers for Dr. Estanislado Pandy to send prescription.  Phone:  [phone]  Fax [phone]

## 2018-06-11 ENCOUNTER — Telehealth: Payer: Self-pay | Admitting: Rheumatology

## 2018-06-11 NOTE — Telephone Encounter (Signed)
Patient called stating she received a call from [facility] stating they received the prescription for Enbrel, but there are errors and they are requesting a call before they will send her medication.  Patient states she is very worried about receiving her medication in time.

## 2018-06-11 NOTE — Telephone Encounter (Signed)
Spoke with [employer] and Re-faxed prescription to [employer] to expedited fax number. Patient advised and will call to schedule shipment.

## 2018-06-20 DIAGNOSIS — Z1389 Encounter for screening for other disorder: Secondary | ICD-10-CM | POA: Diagnosis not present

## 2018-06-20 DIAGNOSIS — M069 Rheumatoid arthritis, unspecified: Secondary | ICD-10-CM | POA: Diagnosis not present

## 2018-06-20 DIAGNOSIS — G894 Chronic pain syndrome: Secondary | ICD-10-CM | POA: Diagnosis not present

## 2018-07-05 DIAGNOSIS — D692 Other nonthrombocytopenic purpura: Secondary | ICD-10-CM | POA: Diagnosis not present

## 2018-07-05 DIAGNOSIS — D485 Neoplasm of uncertain behavior of skin: Secondary | ICD-10-CM | POA: Diagnosis not present

## 2018-07-05 DIAGNOSIS — D225 Melanocytic nevi of trunk: Secondary | ICD-10-CM | POA: Diagnosis not present

## 2018-07-05 DIAGNOSIS — D1801 Hemangioma of skin and subcutaneous tissue: Secondary | ICD-10-CM | POA: Diagnosis not present

## 2018-07-05 DIAGNOSIS — L821 Other seborrheic keratosis: Secondary | ICD-10-CM | POA: Diagnosis not present

## 2018-07-05 DIAGNOSIS — L905 Scar conditions and fibrosis of skin: Secondary | ICD-10-CM | POA: Diagnosis not present

## 2018-07-06 ENCOUNTER — Other Ambulatory Visit: Payer: Self-pay | Admitting: Rheumatology

## 2018-07-08 NOTE — Telephone Encounter (Signed)
Ok to refill PLQ.

## 2018-07-08 NOTE — Telephone Encounter (Addendum)
Last Visit: 06/04/18 Next Visit: 11/05/18 Labs: 04/04/18  Creatinine borderline elevated. GFR is WNL. Hgb and hct is trending down  PLQ Eye Exam: WNL 10/25/16  Patient advised she is due to update her PLQ eye exam. Patient states she has an appointment scheduled in August. Patient states she has had an eye exam since October 2018 and will have the ye doctor fax the results over  Okay to refill 30 day supply PLQ?

## 2018-07-10 ENCOUNTER — Other Ambulatory Visit: Payer: Self-pay

## 2018-07-10 DIAGNOSIS — Z79899 Other long term (current) drug therapy: Secondary | ICD-10-CM | POA: Diagnosis not present

## 2018-07-11 LAB — COMPLETE METABOLIC PANEL WITH GFR
AG Ratio: 1.5 (calc) (ref 1.0–2.5)
ALT: 10 U/L (ref 6–29)
AST: 19 U/L (ref 10–35)
Albumin: 4.3 g/dL (ref 3.6–5.1)
Alkaline phosphatase (APISO): 54 U/L (ref 37–153)
BUN/Creatinine Ratio: 18 (calc) (ref 6–22)
BUN: 18 mg/dL (ref 7–25)
CO2: 27 mmol/L (ref 20–32)
Calcium: 9.3 mg/dL (ref 8.6–10.4)
Chloride: 103 mmol/L (ref 98–110)
Creat: 0.98 mg/dL — ABNORMAL HIGH (ref 0.60–0.88)
GFR, Est African American: 61 mL/min/{1.73_m2} (ref >=60)
GFR, Est Non African American: 53 mL/min/{1.73_m2} — ABNORMAL LOW (ref >=60)
Globulin: 2.8 g/dL (calc) (ref 1.9–3.7)
Glucose, Bld: 76 mg/dL (ref 65–99)
Potassium: 4.3 mmol/L (ref 3.5–5.3)
Sodium: 139 mmol/L (ref 135–146)
Total Bilirubin: 0.6 mg/dL (ref 0.2–1.2)
Total Protein: 7.1 g/dL (ref 6.1–8.1)

## 2018-07-11 LAB — CBC WITH DIFFERENTIAL/PLATELET
Absolute Monocytes: 670 cells/uL (ref 200–950)
Basophils Absolute: 32 cells/uL (ref 0–200)
Basophils Relative: 0.6 %
Eosinophils Absolute: 130 cells/uL (ref 15–500)
Eosinophils Relative: 2.4 %
HCT: 32.7 % — ABNORMAL LOW (ref 35.0–45.0)
Hemoglobin: 10.2 g/dL — ABNORMAL LOW (ref 11.7–15.5)
Lymphs Abs: 1890 cells/uL (ref 850–3900)
MCH: 24.1 pg — ABNORMAL LOW (ref 27.0–33.0)
MCHC: 31.2 g/dL — ABNORMAL LOW (ref 32.0–36.0)
MCV: 77.1 fL — ABNORMAL LOW (ref 80.0–100.0)
MPV: 10.1 fL (ref 7.5–12.5)
Monocytes Relative: 12.4 %
Neutro Abs: 2678 cells/uL (ref 1500–7800)
Neutrophils Relative %: 49.6 %
Platelets: 283 10*3/uL (ref 140–400)
RBC: 4.24 10*6/uL (ref 3.80–5.10)
RDW: 16.3 % — ABNORMAL HIGH (ref 11.0–15.0)
Total Lymphocyte: 35 %
WBC: 5.4 10*3/uL (ref 3.8–10.8)

## 2018-07-11 NOTE — Progress Notes (Signed)
Labs are stable.  Creatinine is mildly elevated most likely due to the use of diuretics.

## 2018-08-03 ENCOUNTER — Other Ambulatory Visit: Payer: Self-pay | Admitting: Rheumatology

## 2018-08-03 DIAGNOSIS — M0609 Rheumatoid arthritis without rheumatoid factor, multiple sites: Secondary | ICD-10-CM

## 2018-08-05 NOTE — Telephone Encounter (Addendum)
Last Visit:06/04/18 Next Visit:11/05/18 Labs:04/04/18 Creatinine borderline elevated. GFR is WNL. Hgb and hct is trending down  PLQ Eye Exam: WNL 04/26/17   Patient advised she is due to update PLQ eye exam. Patient states she has her appointment scheduled for August 2020.  Okay to refill per Dr. Estanislado Pandy

## 2018-08-13 DIAGNOSIS — E039 Hypothyroidism, unspecified: Secondary | ICD-10-CM | POA: Diagnosis not present

## 2018-08-13 DIAGNOSIS — R7301 Impaired fasting glucose: Secondary | ICD-10-CM | POA: Diagnosis not present

## 2018-08-20 DIAGNOSIS — E049 Nontoxic goiter, unspecified: Secondary | ICD-10-CM | POA: Diagnosis not present

## 2018-08-20 DIAGNOSIS — R7301 Impaired fasting glucose: Secondary | ICD-10-CM | POA: Diagnosis not present

## 2018-08-20 DIAGNOSIS — E039 Hypothyroidism, unspecified: Secondary | ICD-10-CM | POA: Diagnosis not present

## 2018-08-20 DIAGNOSIS — I10 Essential (primary) hypertension: Secondary | ICD-10-CM | POA: Diagnosis not present

## 2018-08-23 ENCOUNTER — Other Ambulatory Visit (HOSPITAL_COMMUNITY): Payer: Self-pay | Admitting: Endocrinology

## 2018-08-23 DIAGNOSIS — E049 Nontoxic goiter, unspecified: Secondary | ICD-10-CM

## 2018-08-26 DIAGNOSIS — R7309 Other abnormal glucose: Secondary | ICD-10-CM | POA: Diagnosis not present

## 2018-08-26 DIAGNOSIS — M069 Rheumatoid arthritis, unspecified: Secondary | ICD-10-CM | POA: Diagnosis not present

## 2018-08-26 DIAGNOSIS — M1991 Primary osteoarthritis, unspecified site: Secondary | ICD-10-CM | POA: Diagnosis not present

## 2018-08-26 DIAGNOSIS — E039 Hypothyroidism, unspecified: Secondary | ICD-10-CM | POA: Diagnosis not present

## 2018-08-26 DIAGNOSIS — I10 Essential (primary) hypertension: Secondary | ICD-10-CM | POA: Diagnosis not present

## 2018-08-27 ENCOUNTER — Telehealth: Payer: Self-pay | Admitting: Rheumatology

## 2018-08-27 ENCOUNTER — Other Ambulatory Visit (HOSPITAL_COMMUNITY): Payer: Self-pay | Admitting: Family Medicine

## 2018-08-27 DIAGNOSIS — E2839 Other primary ovarian failure: Secondary | ICD-10-CM

## 2018-08-27 MED ORDER — ENBREL SURECLICK 50 MG/ML ~~LOC~~ SOAJ: SUBCUTANEOUS | 0 refills | Status: DC

## 2018-08-27 NOTE — Telephone Encounter (Signed)
Last Visit: 06/04/18 Next Visit: 11/05/18 Labs: 07/09/08 labs are stable. Creatinine is mildly elevated  TB Gold: 10/04/18  Okay to refill per Dr. Estanislado Pandy

## 2018-08-27 NOTE — Telephone Encounter (Signed)
Olivia Mackie from [facility] called requesting prescription refill of Enbrel for the patient.  If you have any questions, please call [phone]

## 2018-08-28 ENCOUNTER — Ambulatory Visit (HOSPITAL_COMMUNITY): Payer: Medicare Other

## 2018-08-28 ENCOUNTER — Other Ambulatory Visit: Payer: Self-pay | Admitting: Rheumatology

## 2018-08-28 ENCOUNTER — Encounter (HOSPITAL_COMMUNITY): Payer: Self-pay

## 2018-08-28 DIAGNOSIS — M0609 Rheumatoid arthritis without rheumatoid factor, multiple sites: Secondary | ICD-10-CM

## 2018-08-28 NOTE — Telephone Encounter (Signed)
Last Visit: 06/04/18 Next Visit: 11/05/18 Labs: 07/09/08 labs are stable. Creatinine is mildly elevated  PLQ Eye Exam: WNL 04/26/17   Patient states she has her PLQ eye exam scheduled for 09/03/18.   Okay to refill per Dr. Estanislado Pandy

## 2018-09-03 DIAGNOSIS — Z961 Presence of intraocular lens: Secondary | ICD-10-CM | POA: Diagnosis not present

## 2018-09-03 DIAGNOSIS — H43823 Vitreomacular adhesion, bilateral: Secondary | ICD-10-CM | POA: Diagnosis not present

## 2018-09-03 DIAGNOSIS — Z79899 Other long term (current) drug therapy: Secondary | ICD-10-CM | POA: Diagnosis not present

## 2018-09-03 DIAGNOSIS — H04123 Dry eye syndrome of bilateral lacrimal glands: Secondary | ICD-10-CM | POA: Diagnosis not present

## 2018-09-03 DIAGNOSIS — M064 Inflammatory polyarthropathy: Secondary | ICD-10-CM | POA: Diagnosis not present

## 2018-09-12 ENCOUNTER — Encounter (HOSPITAL_COMMUNITY): Payer: Self-pay

## 2018-09-12 ENCOUNTER — Other Ambulatory Visit (HOSPITAL_COMMUNITY): Payer: Medicare Other

## 2018-09-21 ENCOUNTER — Other Ambulatory Visit: Payer: Self-pay | Admitting: Rheumatology

## 2018-09-21 DIAGNOSIS — M0609 Rheumatoid arthritis without rheumatoid factor, multiple sites: Secondary | ICD-10-CM

## 2018-09-23 NOTE — Telephone Encounter (Signed)
Last Visit:06/04/18 Next Visit:11/05/18 Labs:7/1/10labs are stable. Creatinine is mildly elevated PLQ Eye Exam:  09/03/18 WNL  Okay to refill per Dr. Estanislado Pandy

## 2018-10-08 DIAGNOSIS — Z23 Encounter for immunization: Secondary | ICD-10-CM | POA: Diagnosis not present

## 2018-10-09 DIAGNOSIS — I10 Essential (primary) hypertension: Secondary | ICD-10-CM | POA: Diagnosis not present

## 2018-10-09 DIAGNOSIS — M1991 Primary osteoarthritis, unspecified site: Secondary | ICD-10-CM | POA: Diagnosis not present

## 2018-10-09 DIAGNOSIS — E7849 Other hyperlipidemia: Secondary | ICD-10-CM | POA: Diagnosis not present
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
## 2018-11-05 ENCOUNTER — Telehealth: Payer: Self-pay | Admitting: Pharmacist

## 2018-11-05 ENCOUNTER — Encounter: Payer: Self-pay | Admitting: Physician Assistant

## 2018-11-05 ENCOUNTER — Other Ambulatory Visit: Payer: Self-pay

## 2018-11-05 ENCOUNTER — Ambulatory Visit (INDEPENDENT_AMBULATORY_CARE_PROVIDER_SITE_OTHER): Payer: Medicare Other | Admitting: Physician Assistant

## 2018-11-05 VITALS — BP 151/70 | HR 65 | Resp 14 | Ht 67.0 in | Wt 160.6 lb

## 2018-11-05 DIAGNOSIS — M19042 Primary osteoarthritis, left hand: Secondary | ICD-10-CM

## 2018-11-05 DIAGNOSIS — M19071 Primary osteoarthritis, right ankle and foot: Secondary | ICD-10-CM

## 2018-11-05 DIAGNOSIS — M0609 Rheumatoid arthritis without rheumatoid factor, multiple sites: Secondary | ICD-10-CM

## 2018-11-05 DIAGNOSIS — M19072 Primary osteoarthritis, left ankle and foot: Secondary | ICD-10-CM

## 2018-11-05 DIAGNOSIS — M19041 Primary osteoarthritis, right hand: Secondary | ICD-10-CM | POA: Diagnosis not present

## 2018-11-05 DIAGNOSIS — Z79899 Other long term (current) drug therapy: Secondary | ICD-10-CM | POA: Diagnosis not present

## 2018-11-05 DIAGNOSIS — Z8659 Personal history of other mental and behavioral disorders: Secondary | ICD-10-CM

## 2018-11-05 DIAGNOSIS — I10 Essential (primary) hypertension: Secondary | ICD-10-CM

## 2018-11-05 DIAGNOSIS — M170 Bilateral primary osteoarthritis of knee: Secondary | ICD-10-CM

## 2018-11-05 DIAGNOSIS — Z8639 Personal history of other endocrine, nutritional and metabolic disease: Secondary | ICD-10-CM

## 2018-11-05 DIAGNOSIS — G5702 Lesion of sciatic nerve, left lower limb: Secondary | ICD-10-CM

## 2018-11-05 NOTE — Patient Instructions (Signed)
Standing Labs We placed an order today for your standing lab work.    Please come back and get your standing labs in January and every 3 months   We have open lab daily Monday through Thursday from 8:30-12:30 PM and 1:30-4:30 PM and Friday from 8:30-12:30 PM and 1:30-4:00 PM at the office of Dr. Shaili Deveshwar.   You may experience shorter wait times on Monday and Friday afternoons. The office is located at [address], [address], [place], [place] [address] No appointment is necessary.   Labs are drawn by [facility].  You may receive a bill from [facility] for your lab work.  If you wish to have your labs drawn at another location, please call the office 24 hours in advance to send orders.  If you have any questions regarding directions or hours of operation,  please call [phone].   Just as a reminder please drink plenty of water prior to coming for your lab work. Thanks!   

## 2018-11-05 NOTE — Telephone Encounter (Signed)
Patient given renewal application for Enbrel patient assistance while in office. Will fax when returned  Application to the office along with income documents.  Will update when we hear a response.   Mariella Saa, PharmD, [place], [place] Clinical Specialty Pharmacist [phone]  11/05/2018 2:10 PM

## 2018-11-06 NOTE — Progress Notes (Signed)
Hgb and Hct are low and trending down.  Please notify patient and forward to PCP for further evaluation and treatment.   Creatinine mildly elevated.  She is taking HCTZ.  We will continue to monitor.

## 2018-11-07 LAB — QUANTIFERON-TB GOLD PLUS
Mitogen-NIL: >10 IU/mL
NIL: 0.04 IU/mL
QuantiFERON-TB Gold Plus: NEGATIVE
TB1-NIL: 0.01 IU/mL
TB2-NIL: 0 IU/mL

## 2018-11-07 LAB — CBC WITH DIFFERENTIAL/PLATELET
Absolute Monocytes: 756 cells/uL (ref 200–950)
Basophils Absolute: 48 cells/uL (ref 0–200)
Basophils Relative: 0.8 %
Eosinophils Absolute: 132 cells/uL (ref 15–500)
Eosinophils Relative: 2.2 %
HCT: 30.7 % — ABNORMAL LOW (ref 35.0–45.0)
Hemoglobin: 9.4 g/dL — ABNORMAL LOW (ref 11.7–15.5)
Lymphs Abs: 2286 cells/uL (ref 850–3900)
MCH: 23.3 pg — ABNORMAL LOW (ref 27.0–33.0)
MCHC: 30.6 g/dL — ABNORMAL LOW (ref 32.0–36.0)
MCV: 76 fL — ABNORMAL LOW (ref 80.0–100.0)
MPV: 10 fL (ref 7.5–12.5)
Monocytes Relative: 12.6 %
Neutro Abs: 2778 cells/uL (ref 1500–7800)
Neutrophils Relative %: 46.3 %
Platelets: 275 10*3/uL (ref 140–400)
RBC: 4.04 10*6/uL (ref 3.80–5.10)
RDW: 15.2 % — ABNORMAL HIGH (ref 11.0–15.0)
Total Lymphocyte: 38.1 %
WBC: 6 10*3/uL (ref 3.8–10.8)

## 2018-11-07 LAB — COMPLETE METABOLIC PANEL WITH GFR
AG Ratio: 1.5 (calc) (ref 1.0–2.5)
ALT: 11 U/L (ref 6–29)
AST: 20 U/L (ref 10–35)
Albumin: 4.1 g/dL (ref 3.6–5.1)
Alkaline phosphatase (APISO): 54 U/L (ref 37–153)
BUN/Creatinine Ratio: 19 (calc) (ref 6–22)
BUN: 20 mg/dL (ref 7–25)
CO2: 25 mmol/L (ref 20–32)
Calcium: 9.3 mg/dL (ref 8.6–10.4)
Chloride: 102 mmol/L (ref 98–110)
Creat: 1.04 mg/dL — ABNORMAL HIGH (ref 0.60–0.88)
GFR, Est African American: 57 mL/min/{1.73_m2} — ABNORMAL LOW (ref >=60)
GFR, Est Non African American: 49 mL/min/{1.73_m2} — ABNORMAL LOW (ref >=60)
Globulin: 2.7 g/dL (calc) (ref 1.9–3.7)
Glucose, Bld: 88 mg/dL (ref 65–99)
Potassium: 4.6 mmol/L (ref 3.5–5.3)
Sodium: 138 mmol/L (ref 135–146)
Total Bilirubin: 0.6 mg/dL (ref 0.2–1.2)
Total Protein: 6.8 g/dL (ref 6.1–8.1)

## 2018-11-07 NOTE — Progress Notes (Signed)
TB gold negative

## 2018-11-09 DIAGNOSIS — I10 Essential (primary) hypertension: Secondary | ICD-10-CM | POA: Diagnosis not present

## 2018-11-09 DIAGNOSIS — M1991 Primary osteoarthritis, unspecified site: Secondary | ICD-10-CM | POA: Diagnosis not present

## 2018-11-09 DIAGNOSIS — E7849 Other hyperlipidemia: Secondary | ICD-10-CM | POA: Diagnosis not present

## 2018-11-25 NOTE — Telephone Encounter (Signed)
Received Completed application from patient, income documents were blurred. Advised patient and stated we will still submit application, but they may ask for clear copies of documents to be resent.

## 2018-11-26 ENCOUNTER — Telehealth: Payer: Self-pay | Admitting: Rheumatology

## 2018-11-26 MED ORDER — ENBREL SURECLICK 50 MG/ML ~~LOC~~ SOAJ: SUBCUTANEOUS | 0 refills | Status: DC

## 2018-11-26 NOTE — Telephone Encounter (Signed)
Last Visit: 11/05/18 Next Visit: 04/01/19 Labs: 11/05/18 Hgb and Hct are low and trending down. Creatinine mildly elevated.  TB Gold: 11/05/18 Neg    Okay to refill per Dr. Estanislado Pandy   Prescription sent to [facility] and left message to advise patient.

## 2018-11-26 NOTE — Telephone Encounter (Signed)
Patient called stating [employer] needs a new prescription so they can refill her prescription of Enbrel.  Patient states she was told if it was sent today she could have it delivered on Friday, 11/29/18.

## 2018-12-02 ENCOUNTER — Other Ambulatory Visit: Payer: Self-pay

## 2018-12-02 NOTE — Patient Outreach (Signed)
Erin Schmidt [facility]) Care Management  12/02/2018  Erin Schmidt 1932-09-05 [id number]   Medication Adherence call to Erin Schmidt [facility] Ident[facility] Verify spoke with patient she is past due on Benazepril/Hctz 20/12.5 mg,patient explain she is only taking 1/2 tablet now instead of a full tablet,Erin Schmidt is showing past due under [facility].  [place] Management Direct Dial [phone]  Fax [phone] [email or web address]

## 2018-12-03 NOTE — Patient Outreach (Signed)
[facility]) Care Management  12/03/2018  Erin Schmidt 01/19/1932 [id number]   Open patient my mistake already documented    [place] Management Direct Dial [phone]  Fax [phone] [email or web address] [email or web address]

## 2018-12-10 ENCOUNTER — Telehealth: Payer: Self-pay | Admitting: Pharmacy Technician

## 2018-12-10 NOTE — Telephone Encounter (Signed)
Submitted a Prior Authorization request to [facility] for ENBREL via Cover My Meds. Will update once we receive a response.  PA# [id number]  11:27 AM Beatriz Chancellor, CPhT

## 2018-12-10 NOTE — Telephone Encounter (Signed)
Received notification from [facility] regarding a prior authorization for ENBREL. Authorization has been APPROVED from 12/10/18 to 01/09/20.   Will send document to scan center.  Authorization # [id number] Phone # [phone]  Will fax copy of approval to [employer] for pap application.  1:26 PM Beatriz Chancellor, CPhT

## 2018-12-11 NOTE — Telephone Encounter (Signed)
Received a message from Office that [facility] stated that they will need more clear income documents. Called patient and left message with [employer] fax number [phone].  1:40 PM Beatriz Chancellor, CPhT

## 2018-12-12 ENCOUNTER — Other Ambulatory Visit: Payer: Self-pay | Admitting: Rheumatology

## 2018-12-12 DIAGNOSIS — M0609 Rheumatoid arthritis without rheumatoid factor, multiple sites: Secondary | ICD-10-CM

## 2018-12-12 NOTE — Telephone Encounter (Signed)
Last Visit: 11/05/2018 Next Visit: 04/01/2019 Labs: 11/05/2018 Hgb and Hct are low and trending down.  Creatinine mildly elevated. She is taking HCTZ. We will continue to monitor.  Eye exam: 09/03/2018  Okay to refill per Dr. Estanislado Pandy.

## 2018-12-16 ENCOUNTER — Telehealth: Payer: Self-pay | Admitting: Rheumatology

## 2018-12-16 NOTE — Telephone Encounter (Signed)
Called [employer], patient's application has been received and is currently in review. They will fax office with determination.

## 2018-12-16 NOTE — Telephone Encounter (Signed)
[facility] received PA for Enbrel, but does not have complete application and RX. Please fax info. Fax# [phone]

## 2018-12-23 NOTE — Telephone Encounter (Signed)
Received a fax from  [facility] regarding an approval for ENBREL till 01/09/2020  Will send document to scan center.   Mariella Saa, PharmD, [place], [place] Clinical Specialty Pharmacist [phone]  12/23/2018 9:59 AM

## 2018-12-23 NOTE — Telephone Encounter (Signed)
Received a fax from  [facility] regarding an approval for ENBREL till 01/09/2020  Will send document to scan center.   Mariella Saa, PharmD, [place], [place] Clinical Specialty Pharmacist [phone]  12/23/2018 9:59 AM

## 2019-01-09 DIAGNOSIS — M069 Rheumatoid arthritis, unspecified: Secondary | ICD-10-CM | POA: Diagnosis not present

## 2019-01-09 DIAGNOSIS — G894 Chronic pain syndrome: Secondary | ICD-10-CM | POA: Diagnosis not present

## 2019-01-09 DIAGNOSIS — M1991 Primary osteoarthritis, unspecified site: Secondary | ICD-10-CM | POA: Diagnosis not present

## 2019-02-03 ENCOUNTER — Other Ambulatory Visit (HOSPITAL_COMMUNITY): Payer: Self-pay | Admitting: Family Medicine

## 2019-02-03 DIAGNOSIS — Z1231 Encounter for screening mammogram for malignant neoplasm of breast: Secondary | ICD-10-CM

## 2019-02-07 ENCOUNTER — Other Ambulatory Visit: Payer: Self-pay

## 2019-02-07 DIAGNOSIS — Z79899 Other long term (current) drug therapy: Secondary | ICD-10-CM | POA: Diagnosis not present

## 2019-02-07 LAB — CBC WITH DIFFERENTIAL/PLATELET
Absolute Monocytes: 734 cells/uL (ref 200–950)
Basophils Absolute: 38 cells/uL (ref 0–200)
Basophils Relative: 0.8 %
Eosinophils Absolute: 307 cells/uL (ref 15–500)
Eosinophils Relative: 6.4 %
HCT: 29.4 % — ABNORMAL LOW (ref 35.0–45.0)
Hemoglobin: 8.7 g/dL — ABNORMAL LOW (ref 11.7–15.5)
Lymphs Abs: 1714 cells/uL (ref 850–3900)
MCH: 21.8 pg — ABNORMAL LOW (ref 27.0–33.0)
MCHC: 29.6 g/dL — ABNORMAL LOW (ref 32.0–36.0)
MCV: 73.7 fL — ABNORMAL LOW (ref 80.0–100.0)
MPV: 9.8 fL (ref 7.5–12.5)
Monocytes Relative: 15.3 %
Neutro Abs: 2006 cells/uL (ref 1500–7800)
Neutrophils Relative %: 41.8 %
Platelets: 245 10*3/uL (ref 140–400)
RBC: 3.99 10*6/uL (ref 3.80–5.10)
RDW: 15.6 % — ABNORMAL HIGH (ref 11.0–15.0)
Total Lymphocyte: 35.7 %
WBC: 4.8 10*3/uL (ref 3.8–10.8)

## 2019-02-07 LAB — COMPLETE METABOLIC PANEL WITH GFR
AG Ratio: 1.6 (calc) (ref 1.0–2.5)
ALT: 12 U/L (ref 6–29)
AST: 19 U/L (ref 10–35)
Albumin: 3.9 g/dL (ref 3.6–5.1)
Alkaline phosphatase (APISO): 57 U/L (ref 37–153)
BUN/Creatinine Ratio: 19 (calc) (ref 6–22)
BUN: 18 mg/dL (ref 7–25)
CO2: 28 mmol/L (ref 20–32)
Calcium: 8.9 mg/dL (ref 8.6–10.4)
Chloride: 103 mmol/L (ref 98–110)
Creat: 0.97 mg/dL — ABNORMAL HIGH (ref 0.60–0.88)
GFR, Est African American: 62 mL/min/{1.73_m2} (ref >=60)
GFR, Est Non African American: 54 mL/min/{1.73_m2} — ABNORMAL LOW (ref >=60)
Globulin: 2.5 g/dL (calc) (ref 1.9–3.7)
Glucose, Bld: 101 mg/dL — ABNORMAL HIGH (ref 65–99)
Potassium: 4.1 mmol/L (ref 3.5–5.3)
Sodium: 139 mmol/L (ref 135–146)
Total Bilirubin: 0.4 mg/dL (ref 0.2–1.2)
Total Protein: 6.4 g/dL (ref 6.1–8.1)

## 2019-02-09 DIAGNOSIS — M069 Rheumatoid arthritis, unspecified: Secondary | ICD-10-CM | POA: Diagnosis not present

## 2019-02-09 DIAGNOSIS — M1991 Primary osteoarthritis, unspecified site: Secondary | ICD-10-CM | POA: Diagnosis not present

## 2019-02-09 DIAGNOSIS — E039 Hypothyroidism, unspecified: Secondary | ICD-10-CM | POA: Diagnosis not present

## 2019-02-10 NOTE — Progress Notes (Signed)
Creatinine and GFR are improving.  Rest of CMP WNL.   The patient has chronic anemia.  Her hemoglobin and hct is very low and is trending down.  Please ask the patient if she has noticed any blood in her stool. Please forward labs to PCP ASAP

## 2019-02-21 DIAGNOSIS — Z0001 Encounter for general adult medical examination with abnormal findings: Secondary | ICD-10-CM | POA: Diagnosis not present

## 2019-02-21 DIAGNOSIS — E039 Hypothyroidism, unspecified: Secondary | ICD-10-CM | POA: Diagnosis not present

## 2019-02-21 DIAGNOSIS — E782 Mixed hyperlipidemia: Secondary | ICD-10-CM | POA: Diagnosis not present

## 2019-02-21 DIAGNOSIS — Z1389 Encounter for screening for other disorder: Secondary | ICD-10-CM | POA: Diagnosis not present

## 2019-02-21 DIAGNOSIS — E538 Deficiency of other specified B group vitamins: Secondary | ICD-10-CM | POA: Diagnosis not present

## 2019-02-21 DIAGNOSIS — M1991 Primary osteoarthritis, unspecified site: Secondary | ICD-10-CM | POA: Diagnosis not present

## 2019-02-21 DIAGNOSIS — I10 Essential (primary) hypertension: Secondary | ICD-10-CM | POA: Diagnosis not present

## 2019-02-21 DIAGNOSIS — E559 Vitamin D deficiency, unspecified: Secondary | ICD-10-CM | POA: Diagnosis not present

## 2019-02-21 DIAGNOSIS — D649 Anemia, unspecified: Secondary | ICD-10-CM | POA: Diagnosis not present

## 2019-02-21 DIAGNOSIS — M069 Rheumatoid arthritis, unspecified: Secondary | ICD-10-CM | POA: Diagnosis not present

## 2019-02-24 ENCOUNTER — Ambulatory Visit (HOSPITAL_COMMUNITY): Payer: Medicare Other

## 2019-03-04 DIAGNOSIS — Z961 Presence of intraocular lens: Secondary | ICD-10-CM | POA: Diagnosis not present

## 2019-03-04 DIAGNOSIS — Z79899 Other long term (current) drug therapy: Secondary | ICD-10-CM | POA: Diagnosis not present

## 2019-03-04 DIAGNOSIS — H04123 Dry eye syndrome of bilateral lacrimal glands: Secondary | ICD-10-CM | POA: Diagnosis not present

## 2019-03-04 DIAGNOSIS — H43823 Vitreomacular adhesion, bilateral: Secondary | ICD-10-CM | POA: Diagnosis not present

## 2019-03-11 DIAGNOSIS — H33102 Unspecified retinoschisis, left eye: Secondary | ICD-10-CM | POA: Diagnosis not present

## 2019-03-11 DIAGNOSIS — H43821 Vitreomacular adhesion, right eye: Secondary | ICD-10-CM | POA: Diagnosis not present

## 2019-03-11 DIAGNOSIS — H43822 Vitreomacular adhesion, left eye: Secondary | ICD-10-CM | POA: Diagnosis not present

## 2019-03-11 DIAGNOSIS — H3561 Retinal hemorrhage, right eye: Secondary | ICD-10-CM | POA: Diagnosis not present

## 2019-03-12 ENCOUNTER — Other Ambulatory Visit (HOSPITAL_COMMUNITY): Payer: Self-pay | Admitting: Family Medicine

## 2019-03-12 DIAGNOSIS — Z0001 Encounter for general adult medical examination with abnormal findings: Secondary | ICD-10-CM

## 2019-03-12 DIAGNOSIS — Z1389 Encounter for screening for other disorder: Secondary | ICD-10-CM

## 2019-03-12 DIAGNOSIS — E2839 Other primary ovarian failure: Secondary | ICD-10-CM

## 2019-03-12 DIAGNOSIS — Z6825 Body mass index (BMI) 25.0-25.9, adult: Secondary | ICD-10-CM

## 2019-03-13 ENCOUNTER — Ambulatory Visit (HOSPITAL_COMMUNITY)
Admission: RE | Admit: 2019-03-13 | Discharge: 2019-03-13 | Disposition: A | Discharge status: Home / Self Care | Payer: Medicare Other | Source: Ambulatory Visit | Attending: Family Medicine | Admitting: Family Medicine

## 2019-03-13 ENCOUNTER — Other Ambulatory Visit: Payer: Self-pay

## 2019-03-13 DIAGNOSIS — Z1231 Encounter for screening mammogram for malignant neoplasm of breast: Secondary | ICD-10-CM | POA: Insufficient documentation

## 2019-03-19 ENCOUNTER — Other Ambulatory Visit: Payer: Self-pay

## 2019-03-19 ENCOUNTER — Ambulatory Visit (HOSPITAL_COMMUNITY)
Admission: RE | Admit: 2019-03-19 | Discharge: 2019-03-19 | Disposition: A | Discharge status: Home / Self Care | Payer: Medicare Other | Source: Ambulatory Visit | Attending: Family Medicine | Admitting: Family Medicine

## 2019-03-19 DIAGNOSIS — Z6825 Body mass index (BMI) 25.0-25.9, adult: Secondary | ICD-10-CM | POA: Diagnosis present

## 2019-03-19 DIAGNOSIS — Z0001 Encounter for general adult medical examination with abnormal findings: Secondary | ICD-10-CM | POA: Diagnosis not present

## 2019-03-19 DIAGNOSIS — E2839 Other primary ovarian failure: Secondary | ICD-10-CM | POA: Insufficient documentation

## 2019-03-19 DIAGNOSIS — Z780 Asymptomatic menopausal state: Secondary | ICD-10-CM | POA: Diagnosis not present

## 2019-03-19 DIAGNOSIS — M85852 Other specified disorders of bone density and structure, left thigh: Secondary | ICD-10-CM | POA: Diagnosis not present

## 2019-03-19 DIAGNOSIS — Z1389 Encounter for screening for other disorder: Secondary | ICD-10-CM | POA: Diagnosis not present
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
## 2019-04-01 ENCOUNTER — Encounter: Payer: Self-pay | Admitting: Physician Assistant

## 2019-04-01 ENCOUNTER — Other Ambulatory Visit: Payer: Self-pay

## 2019-04-01 ENCOUNTER — Ambulatory Visit: Payer: Medicare Other | Admitting: Physician Assistant

## 2019-04-01 VITALS — BP 109/45 | HR 42 | Resp 16 | Ht 67.0 in | Wt 157.4 lb

## 2019-04-01 DIAGNOSIS — Z79899 Other long term (current) drug therapy: Secondary | ICD-10-CM | POA: Diagnosis not present

## 2019-04-01 DIAGNOSIS — M19071 Primary osteoarthritis, right ankle and foot: Secondary | ICD-10-CM | POA: Diagnosis not present

## 2019-04-01 DIAGNOSIS — M170 Bilateral primary osteoarthritis of knee: Secondary | ICD-10-CM

## 2019-04-01 DIAGNOSIS — Z8659 Personal history of other mental and behavioral disorders: Secondary | ICD-10-CM

## 2019-04-01 DIAGNOSIS — M19041 Primary osteoarthritis, right hand: Secondary | ICD-10-CM

## 2019-04-01 DIAGNOSIS — M19072 Primary osteoarthritis, left ankle and foot: Secondary | ICD-10-CM

## 2019-04-01 DIAGNOSIS — Z8679 Personal history of other diseases of the circulatory system: Secondary | ICD-10-CM

## 2019-04-01 DIAGNOSIS — G5702 Lesion of sciatic nerve, left lower limb: Secondary | ICD-10-CM

## 2019-04-01 DIAGNOSIS — M0609 Rheumatoid arthritis without rheumatoid factor, multiple sites: Secondary | ICD-10-CM | POA: Diagnosis not present

## 2019-04-01 DIAGNOSIS — M19042 Primary osteoarthritis, left hand: Secondary | ICD-10-CM

## 2019-04-01 DIAGNOSIS — Z8639 Personal history of other endocrine, nutritional and metabolic disease: Secondary | ICD-10-CM

## 2019-04-01 NOTE — Patient Instructions (Signed)
Standing Labs We placed an order today for your standing lab work.    Please come back and get your standing labs in April and every 3 months   We have open lab daily Monday through Thursday from 8:30-12:30 PM and 1:30-4:30 PM and Friday from 8:30-12:30 PM and 1:30-4:00 PM at the office of Dr. Shaili Deveshwar.   You may experience shorter wait times on Monday and Friday afternoons. The office is located at [address], [address], [place], [place] [address] No appointment is necessary.   Labs are drawn by [facility].  You may receive a bill from [facility] for your lab work.  If you wish to have your labs drawn at another location, please call the office 24 hours in advance to send orders.  If you have any questions regarding directions or hours of operation,  please call [phone].   Just as a reminder please drink plenty of water prior to coming for your lab work. Thanks!   

## 2019-04-09 DIAGNOSIS — M1991 Primary osteoarthritis, unspecified site: Secondary | ICD-10-CM | POA: Diagnosis not present

## 2019-04-09 DIAGNOSIS — E039 Hypothyroidism, unspecified: Secondary | ICD-10-CM | POA: Diagnosis not present

## 2019-04-09 DIAGNOSIS — M069 Rheumatoid arthritis, unspecified: Secondary | ICD-10-CM | POA: Diagnosis not present

## 2019-04-13 ENCOUNTER — Other Ambulatory Visit: Payer: Self-pay | Admitting: Rheumatology

## 2019-04-13 DIAGNOSIS — M0609 Rheumatoid arthritis without rheumatoid factor, multiple sites: Secondary | ICD-10-CM

## 2019-04-14 NOTE — Telephone Encounter (Signed)
Last Visit: 04/01/19 Next Visit: 09/04/19 Labs: 02/07/19 Creatinine and GFR are improving. Rest of CMP WNL.  PLQ Eye Exam: WNL 09/03/18   Current Dose per office note on 04/01/19: Plaquenil 200 mg 1 tablet twice daily  Okay to refill per Dr. Estanislado Pandy

## 2019-04-23 ENCOUNTER — Other Ambulatory Visit: Payer: Self-pay

## 2019-04-23 DIAGNOSIS — Z79899 Other long term (current) drug therapy: Secondary | ICD-10-CM

## 2019-04-24 LAB — COMPLETE METABOLIC PANEL WITH GFR
AG Ratio: 1.5 (calc) (ref 1.0–2.5)
ALT: 11 U/L (ref 6–29)
AST: 19 U/L (ref 10–35)
Albumin: 4 g/dL (ref 3.6–5.1)
Alkaline phosphatase (APISO): 59 U/L (ref 37–153)
BUN/Creatinine Ratio: 25 (calc) — ABNORMAL HIGH (ref 6–22)
BUN: 25 mg/dL (ref 7–25)
CO2: 26 mmol/L (ref 20–32)
Calcium: 8.8 mg/dL (ref 8.6–10.4)
Chloride: 106 mmol/L (ref 98–110)
Creat: 1 mg/dL — ABNORMAL HIGH (ref 0.60–0.88)
GFR, Est African American: 60 mL/min/{1.73_m2} (ref >=60)
GFR, Est Non African American: 52 mL/min/{1.73_m2} — ABNORMAL LOW (ref >=60)
Globulin: 2.7 g/dL (calc) (ref 1.9–3.7)
Glucose, Bld: 87 mg/dL (ref 65–99)
Potassium: 4.3 mmol/L (ref 3.5–5.3)
Sodium: 141 mmol/L (ref 135–146)
Total Bilirubin: 0.4 mg/dL (ref 0.2–1.2)
Total Protein: 6.7 g/dL (ref 6.1–8.1)

## 2019-04-24 LAB — CBC WITH DIFFERENTIAL/PLATELET
Absolute Monocytes: 771 cells/uL (ref 200–950)
Basophils Absolute: 40 cells/uL (ref 0–200)
Basophils Relative: 0.6 %
Eosinophils Absolute: 188 cells/uL (ref 15–500)
Eosinophils Relative: 2.8 %
HCT: 28.1 % — ABNORMAL LOW (ref 35.0–45.0)
Hemoglobin: 8.3 g/dL — ABNORMAL LOW (ref 11.7–15.5)
Lymphs Abs: 2151 cells/uL (ref 850–3900)
MCH: 21.2 pg — ABNORMAL LOW (ref 27.0–33.0)
MCHC: 29.5 g/dL — ABNORMAL LOW (ref 32.0–36.0)
MCV: 71.7 fL — ABNORMAL LOW (ref 80.0–100.0)
MPV: 10 fL (ref 7.5–12.5)
Monocytes Relative: 11.5 %
Neutro Abs: 3551 cells/uL (ref 1500–7800)
Neutrophils Relative %: 53 %
Platelets: 299 10*3/uL (ref 140–400)
RBC: 3.92 10*6/uL (ref 3.80–5.10)
RDW: 17.1 % — ABNORMAL HIGH (ref 11.0–15.0)
Total Lymphocyte: 32.1 %
WBC: 6.7 10*3/uL (ref 3.8–10.8)

## 2019-04-24 NOTE — Progress Notes (Signed)
Creatinine is borderline elevated at 1.0 and GFR was 52.  Please advise patient to avoid NSAIDs. She continues to have chronic anemia.  Her hemoglobin is low and continues to trend down. Hgb is 8.3.  Please notify patient and place referral to hematology.

## 2019-04-25 ENCOUNTER — Other Ambulatory Visit: Payer: Self-pay

## 2019-04-25 DIAGNOSIS — D649 Anemia, unspecified: Secondary | ICD-10-CM

## 2019-05-06 ENCOUNTER — Encounter (HOSPITAL_COMMUNITY): Payer: Self-pay

## 2019-05-06 ENCOUNTER — Other Ambulatory Visit: Payer: Self-pay

## 2019-05-07 ENCOUNTER — Inpatient Hospital Stay (HOSPITAL_COMMUNITY): Payer: Medicare Other | Attending: Hematology | Admitting: Hematology

## 2019-05-07 ENCOUNTER — Encounter (HOSPITAL_COMMUNITY): Payer: Self-pay | Admitting: Hematology

## 2019-05-07 ENCOUNTER — Inpatient Hospital Stay (HOSPITAL_COMMUNITY): Payer: Medicare Other

## 2019-05-07 VITALS — BP 143/53 | HR 77 | Temp 97.3°F | Resp 16 | Ht 67.0 in | Wt 151.0 lb

## 2019-05-07 DIAGNOSIS — E039 Hypothyroidism, unspecified: Secondary | ICD-10-CM | POA: Diagnosis not present

## 2019-05-07 DIAGNOSIS — M069 Rheumatoid arthritis, unspecified: Secondary | ICD-10-CM | POA: Insufficient documentation

## 2019-05-07 DIAGNOSIS — D509 Iron deficiency anemia, unspecified: Secondary | ICD-10-CM

## 2019-05-07 DIAGNOSIS — I129 Hypertensive chronic kidney disease with stage 1 through stage 4 chronic kidney disease, or unspecified chronic kidney disease: Secondary | ICD-10-CM | POA: Diagnosis not present

## 2019-05-07 DIAGNOSIS — D649 Anemia, unspecified: Secondary | ICD-10-CM

## 2019-05-07 DIAGNOSIS — Z79899 Other long term (current) drug therapy: Secondary | ICD-10-CM | POA: Diagnosis not present

## 2019-05-07 DIAGNOSIS — N189 Chronic kidney disease, unspecified: Secondary | ICD-10-CM

## 2019-05-07 HISTORY — DX: Iron deficiency anemia, unspecified: D50.9

## 2019-05-07 LAB — RETICULOCYTES
Immature Retic Fract: 12.7 % (ref 2.3–15.9)
RBC.: 4.21 MIL/uL (ref 3.87–5.11)
Retic Count, Absolute: 48 10*3/uL (ref 19.0–186.0)
Retic Ct Pct: 1.1 % (ref 0.4–3.1)

## 2019-05-07 LAB — FOLATE: Folate: 30.2 ng/mL (ref >5.9)

## 2019-05-07 LAB — VITAMIN D 25 HYDROXY (VIT D DEFICIENCY, FRACTURES): Vit D, 25-Hydroxy: 30.23 ng/mL (ref 30–100)

## 2019-05-07 LAB — IRON AND TIBC
Iron: 14 ug/dL — ABNORMAL LOW (ref 28–170)
Saturation Ratios: 2 % — ABNORMAL LOW (ref 10.4–31.8)
TIBC: 574 ug/dL — ABNORMAL HIGH (ref 250–450)
UIBC: 560 ug/dL

## 2019-05-07 LAB — LACTATE DEHYDROGENASE: LDH: 162 U/L (ref 98–192)

## 2019-05-07 LAB — FERRITIN: Ferritin: 3 ng/mL — ABNORMAL LOW (ref 11–307)

## 2019-05-07 LAB — VITAMIN B12: Vitamin B-12: 350 pg/mL (ref 180–914)

## 2019-05-07 NOTE — Patient Instructions (Signed)
[place] at [facility] Discharge Instructions  Folllow up in 2-3 weeks    Thank you for choosing [place] at [facility] to provide your oncology and hematology care.  To afford each patient quality time with our provider, please arrive at least 15 minutes before your scheduled appointment time.   If you have a lab appointment with the [facility] please come in thru the Main Entrance and check in at the main information desk.  You need to re-schedule your appointment should you arrive 10 or more minutes late.  We strive to give you quality time with our providers, and arriving late affects you and other patients whose appointments are after yours.  Also, if you no show three or more times for appointments you may be dismissed from the clinic at the providers discretion.     Again, thank you for choosing [facility].  Our hope is that these requests will decrease the amount of time that you wait before being seen by our physicians.       _____________________________________________________________  Should you have questions after your visit to [facility], please contact our office at [phone] between the hours of 8:00 a.m. and 4:30 p.m.  Voicemails left after 4:00 p.m. will not be returned until the following business day.  For prescription refill requests, have your pharmacy contact our office and allow 72 hours.    Due to Covid, you will need to wear a mask upon entering the hospital. If you do not have a mask, a mask will be given to you at the Main Entrance upon arrival. For doctor visits, patients may have 1 support person with them. For treatment visits, patients can not have anyone with them due to social distancing guidelines and our immunocompromised population.

## 2019-05-07 NOTE — Assessment & Plan Note (Addendum)
1.  Microcytic anemia: -CBC on 04/23/2019 shows white count 6.7, hemoglobin 8.3, MCV 71.7 and platelet count two ninety-nine. -She has been anemic with hemoglobin below ten consistently since October 2020. -She reports that her colonoscopy is within the last 10 years by Dr. Laural Golden, apparently normal. -He denies any bleeding per rectum or melena.  Denies prior history of blood transfusion. -She also has mild degree of chronic kidney disease, likely contributing to her anemia. -We will check her ferritin, iron panel, B12, methylmalonic acid, copper levels.  We will rule out hemolysis by checking LDH and reticulocyte count. -We will also check her stool for occult blood.  Will check SPEP for plasma cell disorders. -I have briefly talked to her about possibility of Feraheme infusion weekly x2 depending on the labs.  2.  Rheumatoid arthritis: -She is on Enbrel weekly for the last 2 years along with Plaquenil twice daily. -Her arthritis is fairly well controlled on the current regimen.  Predominantly involves the hands.  3.  Health maintenance: -Mammogram on 03/13/2019 was BI-RADS Category 1. -DEXA scan on 03/19/2019 shows T score -1.1, osteopenia.

## 2019-05-07 NOTE — Progress Notes (Signed)
CONSULT NOTE  Patient Care Team: Sharilyn Sites, MD as PCP - General (Family Medicine)  CHIEF COMPLAINTS/PURPOSE OF CONSULTATION: Anemia  HISTORY OF PRESENTING ILLNESS:  Erin Schmidt 86 y.o. female was sent here by [facility] rheumatology for anemia.  Patient reports her hemoglobin has been low since 2018.  Her rheumatologist has been following her levels for years.  She has rheumatoid arthritis.  She was originally started on methotrexate however was changed to Enbrel and Plaquenil.  Her hemoglobin has been dropping steadily over the years.  Her hemoglobin is now 8.3.  Patient denies any bleeding issues.  She is not noticed any recent bleeding such as epistasis, hematuria or hematochezia.  She also denies any vaginal bleeding and had 2 partial hysterectomies in the past.  She denies any history of blood clots.  She denies ever needing a blood transfusion.  She denies any alcohol, smoking or illicit drug use.  She denies any pica and eats a variety of diet.  She has had no recent new medications added.  She reports she has never tried oral iron in the past.  Patient reports she is being followed by Dr. Laural Golden for GI.  She reports he has never seen anything concerning with her colonoscopies.  She denies any over-the-counter NSAID ingestion.  She denies any recent chest pain on exertion, shortness of breath on minimal exertion, presyncopal episodes or palpitations.  The only family history she has is a son who recently died of lung cancer.    MEDICAL HISTORY:  Past Medical History:  Diagnosis Date  . Arthritis   . GERD (gastroesophageal reflux disease)   . Hyperlipemia   . Hypertension    preventative measures  . Hypothyroidism   . Migraine   . Thyroid disease   . Vertigo     SURGICAL HISTORY: Past Surgical History:  Procedure Laterality Date  . APPENDECTOMY    . BREAST BIOPSY Right    benign  . BREAST EXCISIONAL BIOPSY Left    Benign  . BREAST EXCISIONAL BIOPSY Left     Benign  . BREAST LUMPECTOMY  30 yrs ago   Dr. Milbert Coulter- rt breast  . BREAST LUMPECTOMY  15 yrs ago   left- Dr. Romona Curls  . BREAST LUMPECTOMY  01/2011  . CHOLECYSTECTOMY    . COLONOSCOPY    . COLONOSCOPY  04/13/2011   Procedure: COLONOSCOPY;  Surgeon: Rogene Houston, MD;  Location: [facility];  Service: Endoscopy;  Laterality: N/A;  930  . MASTECTOMY, PARTIAL  01/13/2011   Procedure: MASTECTOMY PARTIAL;  Surgeon: Adin Hector, MD;  Location: [place];  Service: General;  Laterality: Left;  left partial mastectomy with needle locallization  . VAGINAL HYSTERECTOMY     2 partials    SOCIAL HISTORY: Social History   Socioeconomic History  . Marital status: Married    Spouse name: Not on file  . Number of children: 2  . Years of education: Not on file  . Highest education level: Not on file  Occupational History  . Occupation: RETIRED  Tobacco Use  . Smoking status: Former Smoker    Packs/day: 1.00    Years: 20.00    Pack years: 20.00    Types: Cigarettes    Quit date: 01/05/1989    Years since quitting: 30.3  . Smokeless tobacco: Never Used  Substance and Sexual Activity  . Alcohol use: No  . Drug use: Never  . Sexual activity: Not on file  Other Topics Concern  .  Not on file  Social History Narrative  . Not on file   Social Determinants of Health   Financial Resource Strain:   . Difficulty of Paying Living Expenses:   Food Insecurity:   . Worried About Charity fundraiser in the Last Year:   . Arboriculturist in the Last Year:   Transportation Needs:   . Film/video editor (Medical):   Marland Kitchen Lack of Transportation (Non-Medical):   Physical Activity:   . Days of Exercise per Week:   . Minutes of Exercise per Session:   Stress:   . Feeling of Stress :   Social Connections:   . Frequency of Communication with Friends and Family:   . Frequency of Social Gatherings with Friends and Family:   . Attends Religious Services:   . Active Member of  Clubs or Organizations:   . Attends Archivist Meetings:   Marland Kitchen Marital Status:   Intimate Partner Violence:   . Fear of Current or Ex-Partner:   . Emotionally Abused:   Marland Kitchen Physically Abused:   . Sexually Abused:     FAMILY HISTORY: Family History  Problem Relation Age of Onset  . Heart disease Mother   . Stroke Sister   . Heart disease Brother   . Atrial fibrillation Sister   . Coronary artery disease Brother   . Lung cancer Son   . Healthy Son   . Colon cancer Neg Hx     ALLERGIES:  has No Known Allergies.  MEDICATIONS:  Current Outpatient Medications  Medication Sig Dispense Refill  . benazepril-hydrochlorthiazide (LOTENSIN HCT) 20-12.5 MG per tablet Take 0.5 tablets by mouth daily.     . Calcium Carbonate-Vitamin D (CALCIUM + D PO) Take 1 tablet by mouth 2 (two) times daily.     . cetirizine (ZYRTEC) 10 MG tablet Take 10 mg by mouth daily.    Marland Kitchen escitalopram (LEXAPRO) 10 MG tablet Take 10 mg by mouth daily.    Marland Kitchen etanercept (ENBREL SURECLICK) 50 MG/ML injection Inject one pen SQ weekly 12 pen 0  . hydroxychloroquine (PLAQUENIL) 200 MG tablet TAKE 1 TABLET BY MOUTH TWICE A DAY 180 tablet 0  . Ketotifen Fumarate (ALAWAY OP) Apply to eye as needed.    Marland Kitchen levothyroxine (SYNTHROID, LEVOTHROID) 112 MCG tablet Take 112 mcg by mouth daily before breakfast.     . meclizine (ANTIVERT) 25 MG tablet Take 25 mg by mouth 3 (three) times daily as needed for dizziness.    . traMADol-acetaminophen (ULTRACET) 37.5-325 MG tablet Take 1-2 tablets by mouth every 8 (eight) hours as needed.     No current facility-administered medications for this visit.    REVIEW OF SYSTEMS:   Constitutional: Denies fevers, chills or abnormal night sweats + numbness Respiratory: Denies cough, dyspnea or wheezes Cardiovascular: Denies palpitation, chest discomfort or lower extremity swelling Gastrointestinal:  Denies nausea, heartburn or change in bowel habits Skin: Denies abnormal skin  rashes Lymphatics: Denies new lymphadenopathy or easy bruising Neurological:Denies numbness, tingling or new weaknesses Behavioral/Psych: Mood is stable, no new changes  All other systems were reviewed with the patient and are negative.  PHYSICAL EXAMINATION: ECOG PERFORMANCE STATUS: 1 - Symptomatic but completely ambulatory  Vitals:   05/07/19 1421  BP: (!) 143/53  Pulse: 77  Resp: 16  Temp: (!) 97.3 F (36.3 C)  SpO2: 98%   Filed Weights   05/07/19 1421  Weight: 151 lb (68.5 kg)    GENERAL:alert, no distress and comfortable SKIN: skin  color, texture, turgor are normal, no rashes or significant lesions NECK: supple, thyroid normal size, non-tender, without nodularity LYMPH:  no palpable lymphadenopathy in the cervical, axillary or inguinal LUNGS: clear to auscultation and percussion with normal breathing effort HEART: regular rate & rhythm and no murmurs and no lower extremity edema ABDOMEN:abdomen soft, non-tender and normal bowel sounds Musculoskeletal:no cyanosis of digits and no clubbing.  Joint deformity of the right hand present. PSYCH: alert & oriented x 3 with fluent speech NEURO: no focal motor/sensory deficits  LABORATORY DATA:  I have reviewed the data as listed Recent Results (from the past 2160 hour(s))  COMPLETE METABOLIC PANEL WITH GFR     Status: Abnormal   Collection Time: 02/07/19 11:37 AM  Result Value Ref Range   Glucose, Bld 101 (H) 65 - 99 mg/dL    Comment: .            Fasting reference interval . For someone without known diabetes, a glucose value between 100 and 125 mg/dL is consistent with prediabetes and should be confirmed with a follow-up test. .    BUN 18 7 - 25 mg/dL   Creat 0.97 (H) 0.60 - 0.88 mg/dL    Comment: For patients >26 years of age, the reference limit for Creatinine is approximately 13% higher for people identified as African-American. .    GFR, Est Non African American 54 (L) > OR = 60 mL/min/1.32m2   GFR, Est  African American 62 > OR = 60 mL/min/1.20m2   BUN/Creatinine Ratio 19 6 - 22 (calc)   Sodium 139 135 - 146 mmol/L   Potassium 4.1 3.5 - 5.3 mmol/L   Chloride 103 98 - 110 mmol/L   CO2 28 20 - 32 mmol/L   Calcium 8.9 8.6 - 10.4 mg/dL   Total Protein 6.4 6.1 - 8.1 g/dL   Albumin 3.9 3.6 - 5.1 g/dL   Globulin 2.5 1.9 - 3.7 g/dL (calc)   AG Ratio 1.6 1.0 - 2.5 (calc)   Total Bilirubin 0.4 0.2 - 1.2 mg/dL   Alkaline phosphatase (APISO) 57 37 - 153 U/L   AST 19 10 - 35 U/L   ALT 12 6 - 29 U/L  CBC with Differential/Platelet     Status: Abnormal   Collection Time: 02/07/19 11:37 AM  Result Value Ref Range   WBC 4.8 3.8 - 10.8 Thousand/uL   RBC 3.99 3.80 - 5.10 Million/uL   Hemoglobin 8.7 (L) 11.7 - 15.5 g/dL   HCT 29.4 (L) 35.0 - 45.0 %   MCV 73.7 (L) 80.0 - 100.0 fL   MCH 21.8 (L) 27.0 - 33.0 pg   MCHC 29.6 (L) 32.0 - 36.0 g/dL   RDW 15.6 (H) 11.0 - 15.0 %   Platelets 245 140 - 400 Thousand/uL   MPV 9.8 7.5 - 12.5 fL   Neutro Abs 2,006 1,500 - 7,800 cells/uL   Lymphs Abs 1,714 850 - 3,900 cells/uL   Absolute Monocytes 734 200 - 950 cells/uL   Eosinophils Absolute 307 15 - 500 cells/uL   Basophils Absolute 38 0 - 200 cells/uL   Neutrophils Relative % 41.8 %   Total Lymphocyte 35.7 %   Monocytes Relative 15.3 %   Eosinophils Relative 6.4 %   Basophils Relative 0.8 %  COMPLETE METABOLIC PANEL WITH GFR     Status: Abnormal   Collection Time: 04/23/19  1:55 PM  Result Value Ref Range   Glucose, Bld 87 65 - 99 mg/dL    Comment: .  Fasting reference interval .    BUN 25 7 - 25 mg/dL   Creat 1.00 (H) 0.60 - 0.88 mg/dL    Comment: For patients >83 years of age, the reference limit for Creatinine is approximately 13% higher for people identified as African-American. .    GFR, Est Non African American 52 (L) > OR = 60 mL/min/1.6m2   GFR, Est African American 60 > OR = 60 mL/min/1.13m2   BUN/Creatinine Ratio 25 (H) 6 - 22 (calc)   Sodium 141 135 - 146 mmol/L    Potassium 4.3 3.5 - 5.3 mmol/L   Chloride 106 98 - 110 mmol/L   CO2 26 20 - 32 mmol/L   Calcium 8.8 8.6 - 10.4 mg/dL   Total Protein 6.7 6.1 - 8.1 g/dL   Albumin 4.0 3.6 - 5.1 g/dL   Globulin 2.7 1.9 - 3.7 g/dL (calc)   AG Ratio 1.5 1.0 - 2.5 (calc)   Total Bilirubin 0.4 0.2 - 1.2 mg/dL   Alkaline phosphatase (APISO) 59 37 - 153 U/L   AST 19 10 - 35 U/L   ALT 11 6 - 29 U/L  CBC with Differential/Platelet     Status: Abnormal   Collection Time: 04/23/19  1:55 PM  Result Value Ref Range   WBC 6.7 3.8 - 10.8 Thousand/uL   RBC 3.92 3.80 - 5.10 Million/uL   Hemoglobin 8.3 (L) 11.7 - 15.5 g/dL   HCT 28.1 (L) 35.0 - 45.0 %   MCV 71.7 (L) 80.0 - 100.0 fL   MCH 21.2 (L) 27.0 - 33.0 pg   MCHC 29.5 (L) 32.0 - 36.0 g/dL   RDW 17.1 (H) 11.0 - 15.0 %   Platelets 299 140 - 400 Thousand/uL   MPV 10.0 7.5 - 12.5 fL   Neutro Abs 3,551 1,500 - 7,800 cells/uL   Lymphs Abs 2,151 850 - 3,900 cells/uL   Absolute Monocytes 771 200 - 950 cells/uL   Eosinophils Absolute 188 15 - 500 cells/uL   Basophils Absolute 40 0 - 200 cells/uL   Neutrophils Relative % 53 %   Total Lymphocyte 32.1 %   Monocytes Relative 11.5 %   Eosinophils Relative 2.8 %   Basophils Relative 0.6 %  Lactate dehydrogenase     Status: None   Collection Time: 05/07/19  4:03 PM  Result Value Ref Range   LDH 162 98 - 192 U/L    Comment: Performed at [facility], [address], [place], [place] [address]  Reticulocytes     Status: None   Collection Time: 05/07/19  4:03 PM  Result Value Ref Range   Retic Ct Pct 1.1 0.4 - 3.1 %   RBC. 4.21 3.87 - 5.11 MIL/uL   Retic Count, Absolute 48.0 19.0 - 186.0 K/uL   Immature Retic Fract 12.7 2.3 - 15.9 %    Comment: Performed at [facility], [address], [place],  [address]    RADIOGRAPHIC STUDIES: I have personally reviewed the radiological images as listed and agreed with the findings in the report.  ASSESSMENT & PLAN:  Microcytic anemia 1.  Microcytic anemia: -CBC on  04/23/2019 shows white count 6.7, hemoglobin 8.3, MCV 71.7 and platelet count two ninety-nine. -She has been anemic with hemoglobin below ten consistently since October 2020. -She reports that her colonoscopy is within the last 10 years by Dr. Laural Golden, apparently normal. -He denies any bleeding per rectum or melena.  Denies prior history of blood transfusion. -She also has mild degree of chronic kidney  disease, likely contributing to her anemia. -We will check her ferritin, iron panel, B12, methylmalonic acid, copper levels.  We will rule out hemolysis by checking LDH and reticulocyte count. -We will also check her stool for occult blood.  Will check SPEP for plasma cell disorders. -I have briefly talked to her about possibility of Feraheme infusion weekly x2 depending on the labs.  2.  Rheumatoid arthritis: -She is on Enbrel weekly for the last 2 years along with Plaquenil twice daily. -Her arthritis is fairly well controlled on the current regimen.  Predominantly involves the hands.  3.  Health maintenance: -Mammogram on 03/13/2019 was BI-RADS Category 1. -DEXA scan on 03/19/2019 shows T score -1.1, osteopenia.     All questions were answered. The patient knows to call the clinic with any problems, questions or concerns.      Derek Jack, MD 05/07/19 5:17 PM

## 2019-05-08 LAB — PROTEIN ELECTROPHORESIS, SERUM
A/G Ratio: 1.1 (ref 0.7–1.7)
Albumin ELP: 3.7 g/dL (ref 2.9–4.4)
Alpha-1-Globulin: 0.2 g/dL (ref 0.0–0.4)
Alpha-2-Globulin: 0.7 g/dL (ref 0.4–1.0)
Beta Globulin: 1.2 g/dL (ref 0.7–1.3)
Gamma Globulin: 1.2 g/dL (ref 0.4–1.8)
Globulin, Total: 3.3 g/dL (ref 2.2–3.9)
Total Protein ELP: 7 g/dL (ref 6.0–8.5)

## 2019-05-08 LAB — HAPTOGLOBIN: Haptoglobin: 123 mg/dL (ref 41–333)

## 2019-05-09 DIAGNOSIS — E039 Hypothyroidism, unspecified: Secondary | ICD-10-CM | POA: Diagnosis not present

## 2019-05-09 DIAGNOSIS — M069 Rheumatoid arthritis, unspecified: Secondary | ICD-10-CM | POA: Diagnosis not present

## 2019-05-09 DIAGNOSIS — M1991 Primary osteoarthritis, unspecified site: Secondary | ICD-10-CM | POA: Diagnosis not present

## 2019-05-09 LAB — COPPER, SERUM: Copper: 129 ug/dL (ref 80–158)

## 2019-05-10 LAB — METHYLMALONIC ACID, SERUM: Methylmalonic Acid, Quantitative: 359 nmol/L (ref 0–378)

## 2019-05-27 NOTE — Progress Notes (Signed)
Erin Schmidt, [place] [id number]   CLINIC:  Medical Oncology/Hematology  PCP:  Sharilyn Sites, [place] / [place] [address]  [phone]  REASON FOR VISIT:  Follow-up for microcytic anemia  INTERVAL HISTORY:  Erin Schmidt 86 y.o. female returns for routine follow-up for her microcytic anemia. Erin Schmidt was last seen on 05/07/2019. Erin Schmidt declines any major issues. Erin Schmidt does have some constipation from the iron supplementation. Erin Schmidt notes that despite her low hemoglobin, Erin Schmidt doesn't feel bad. Erin Schmidt is able to keep up with her housework and take care of her husband.    REVIEW OF SYSTEMS:  Review of Systems  Constitutional: Negative for appetite change, chills, diaphoresis, fatigue and fever.  HENT:   Negative for mouth sores, sore throat and trouble swallowing.   Eyes: Negative for eye problems.  Respiratory: Negative for cough, shortness of breath and wheezing.   Cardiovascular: Negative for chest pain, leg swelling and palpitations.  Gastrointestinal: Positive for constipation. Negative for abdominal pain, diarrhea, nausea and vomiting.  Genitourinary: Positive for bladder incontinence. Negative for dysuria and frequency.   Musculoskeletal: Negative for arthralgias, back pain and myalgias.  Skin: Negative for rash.  Neurological: Positive for dizziness and numbness (feet). Negative for extremity weakness and headaches.  Hematological: Does not bruise/bleed easily.  Psychiatric/Behavioral: Negative for depression and sleep disturbance. The patient is not nervous/anxious.     PAST MEDICAL/SURGICAL HISTORY:  Past Medical History:  Diagnosis Date  . Arthritis   . GERD (gastroesophageal reflux disease)   . Hyperlipemia   . Hypertension    preventative measures  . Hypothyroidism   . Migraine   . Thyroid disease   . Vertigo    Past Surgical History:  Procedure Laterality Date  . APPENDECTOMY    . BREAST BIOPSY Right    benign  .  BREAST EXCISIONAL BIOPSY Left    Benign  . BREAST EXCISIONAL BIOPSY Left    Benign  . BREAST LUMPECTOMY  30 yrs ago   Dr. Milbert Coulter- rt breast  . BREAST LUMPECTOMY  15 yrs ago   left- Dr. Romona Curls  . BREAST LUMPECTOMY  01/2011  . CHOLECYSTECTOMY    . COLONOSCOPY    . COLONOSCOPY  04/13/2011   Procedure: COLONOSCOPY;  Surgeon: Rogene Houston, MD;  Location: [facility];  Service: Endoscopy;  Laterality: N/A;  930  . MASTECTOMY, PARTIAL  01/13/2011   Procedure: MASTECTOMY PARTIAL;  Surgeon: Adin Hector, MD;  Location: [facility];  Service: General;  Laterality: Left;  left partial mastectomy with needle locallization  . VAGINAL HYSTERECTOMY     2 partials    SOCIAL HISTORY:  Social History   Socioeconomic History  . Marital status: Married    Spouse name: Not on file  . Number of children: 2  . Years of education: Not on file  . Highest education level: Not on file  Occupational History  . Occupation: RETIRED  Tobacco Use  . Smoking status: Former Smoker    Packs/day: 1.00    Years: 20.00    Pack years: 20.00    Types: Cigarettes    Quit date: 01/05/1989    Years since quitting: 30.4  . Smokeless tobacco: Never Used  Substance and Sexual Activity  . Alcohol use: No  . Drug use: Never  . Sexual activity: Not on file  Other Topics Concern  . Not on file  Social History Narrative  . Not on file   Social  Determinants of Health   Financial Resource Strain:   . Difficulty of Paying Living Expenses:   Food Insecurity:   . Worried About Charity fundraiser in the Last Year:   . Arboriculturist in the Last Year:   Transportation Needs:   . Film/video editor (Medical):   Marland Kitchen Lack of Transportation (Non-Medical):   Physical Activity:   . Days of Exercise per Week:   . Minutes of Exercise per Session:   Stress:   . Feeling of Stress :   Social Connections:   . Frequency of Communication with Friends and Family:   . Frequency of Social Gatherings with  Friends and Family:   . Attends Religious Services:   . Active Member of Clubs or Organizations:   . Attends Archivist Meetings:   Marland Kitchen Marital Status:   Intimate Partner Violence:   . Fear of Current or Ex-Partner:   . Emotionally Abused:   Marland Kitchen Physically Abused:   . Sexually Abused:     FAMILY HISTORY:  Family History  Problem Relation Age of Onset  . Heart disease Mother   . Stroke Sister   . Heart disease Brother   . Atrial fibrillation Sister   . Coronary artery disease Brother   . Lung cancer Son   . Healthy Son   . Colon cancer Neg Hx     CURRENT MEDICATIONS:  Current Outpatient Medications  Medication Sig Dispense Refill  . benazepril-hydrochlorthiazide (LOTENSIN HCT) 20-12.5 MG per tablet Take 0.5 tablets by mouth daily.     . Calcium Carbonate-Vitamin D (CALCIUM + D PO) Take 1 tablet by mouth 2 (two) times daily.     . cetirizine (ZYRTEC) 10 MG tablet Take 10 mg by mouth daily.    Marland Kitchen escitalopram (LEXAPRO) 10 MG tablet Take 10 mg by mouth daily.    Marland Kitchen etanercept (ENBREL SURECLICK) 50 MG/ML injection Inject one pen SQ weekly 12 pen 0  . hydroxychloroquine (PLAQUENIL) 200 MG tablet TAKE 1 TABLET BY MOUTH TWICE A DAY 180 tablet 0  . levothyroxine (SYNTHROID, LEVOTHROID) 112 MCG tablet Take 112 mcg by mouth daily before breakfast.     . meclizine (ANTIVERT) 25 MG tablet Take 25 mg by mouth 3 (three) times daily as needed for dizziness.    . traMADol-acetaminophen (ULTRACET) 37.5-325 MG tablet Take 1-2 tablets by mouth every 8 (eight) hours as needed.     No current facility-administered medications for this visit.    ALLERGIES:  No Known Allergies  PHYSICAL EXAM:  Performance status (ECOG): 1 - Symptomatic but completely ambulatory  Vitals:   05/28/19 1606  BP: (!) 143/60  Pulse: 73  Resp: 18  Temp: (!) 97.5 F (36.4 C)  SpO2: 99%   Wt Readings from Last 3 Encounters:  05/28/19 153 lb 8 oz (69.6 kg)  05/07/19 151 lb (68.5 kg)  04/01/19 157 lb 6.4  oz (71.4 kg)   Physical Exam Constitutional:      General: Erin Schmidt is not in acute distress.    Appearance: Normal appearance. Erin Schmidt is normal weight. Erin Schmidt is not ill-appearing.  HENT:     Nose: No congestion or rhinorrhea.     Mouth/Throat:     Mouth: Mucous membranes are moist.     Pharynx: No oropharyngeal exudate or posterior oropharyngeal erythema.  Eyes:     Extraocular Movements: Extraocular movements intact.     Pupils: Pupils are equal, round, and reactive to light.  Cardiovascular:  Rate and Rhythm: Normal rate and regular rhythm.     Pulses: Normal pulses.     Heart sounds: Normal heart sounds. No murmur. No friction rub. No gallop.   Pulmonary:     Effort: Pulmonary effort is normal.     Breath sounds: Normal breath sounds. No wheezing, rhonchi or rales.  Abdominal:     Palpations: There is no mass.     Tenderness: There is no abdominal tenderness. There is no guarding.  Musculoskeletal:        General: No swelling or tenderness.     Right lower leg: No edema.     Left lower leg: No edema.  Skin:    Findings: No bruising or erythema.  Neurological:     Mental Status: Erin Schmidt is alert and oriented to person, place, and time.     Sensory: No sensory deficit.  Psychiatric:        Mood and Affect: Mood normal.        Behavior: Behavior normal.        Thought Content: Thought content normal.        Judgment: Judgment normal.     LABORATORY DATA:  I have reviewed the labs as listed.  CBC Latest Ref Rng & Units 04/23/2019 02/07/2019 11/05/2018  WBC 3.8 - 10.8 Thousand/uL 6.7 4.8 6.0  Hemoglobin 11.7 - 15.5 g/dL 8.3(L) 8.7(L) 9.4(L)  Hematocrit 35.0 - 45.0 % 28.1(L) 29.4(L) 30.7(L)  Platelets 140 - 400 Thousand/uL 299 245 275   CMP Latest Ref Rng & Units 04/23/2019 02/07/2019 11/05/2018  Glucose 65 - 99 mg/dL 87 101(H) 88  BUN 7 - 25 mg/dL 25 18 20   Creatinine 0.60 - 0.88 mg/dL 1.00(H) 0.97(H) 1.04(H)  Sodium 135 - 146 mmol/L 141 139 138  Potassium 3.5 - 5.3 mmol/L 4.3  4.1 4.6  Chloride 98 - 110 mmol/L 106 103 102  CO2 20 - 32 mmol/L 26 28 25   Calcium 8.6 - 10.4 mg/dL 8.8 8.9 9.3  Total Protein 6.1 - 8.1 g/dL 6.7 6.4 6.8  Total Bilirubin 0.2 - 1.2 mg/dL 0.4 0.4 0.6  Alkaline Phos 33 - 130 U/L - - -  AST 10 - 35 U/L 19 19 20   ALT 6 - 29 U/L 11 12 11        Component Value Date/Time   RBC 4.21 05/07/2019 1603   RBC 3.92 04/23/2019 1355   MCV 71.7 (L) 04/23/2019 1355   MCH 21.2 (L) 04/23/2019 1355   MCHC 29.5 (L) 04/23/2019 1355   RDW 17.1 (H) 04/23/2019 1355   LYMPHSABS 2,151 04/23/2019 1355   MONOABS 572 08/21/2016 0922   EOSABS 188 04/23/2019 1355   BASOSABS 40 04/23/2019 1355    DIAGNOSTIC IMAGING:  Mammogram on 03/13/2019 was BI-RADS Category 1.  ASSESSMENT & PLAN:  Microcytic anemia 1.  Severe iron deficiency anemia: -CBC on 04/23/2019 shows white count 6.7, hemoglobin 8.3, MCV 71.7. -Erin Schmidt has been anemic with hemoglobin below 10 consistently since October 2020. -Colonoscopy within the last 10 years by Dr. Laural Golden, apparently normal. -Denies any bleeding per rectum or melena.  No prior history of blood transfusion. -We reviewed her blood work from 05/07/2019.  Ferritin is severely low at 3.  123456, folic acid, methylmalonic acid and copper levels were normal.  SPEP was negative. -Erin Schmidt could not tolerate oral iron because of gastric symptoms. -We talked about Feraheme weekly x2.  Erin Schmidt does not have any severe drug allergies.  Hence we can proceed without premedication.  We talked about  side effects including serious allergic reactions.  We will schedule her for Feraheme.  We will see her back in 6 weeks with repeat ferritin, iron panel, CBC.  2.  Rheumatoid arthritis: -Erin Schmidt is on Enbrel weekly in the last 2 years along with Plaquenil twice daily. -Arthritis is fairly well controlled on the current regimen.  Predominantly involves the hands.  3.  Health maintenance: -Mammogram on 03/13/2019 was BI-RADS Category 1. -DEXA scan on 03/19/2019 shows T  score -1.1.  Consistent with osteopenia.  Vitamin D level was normal at 30.23.    Orders placed this encounter:  Orders Placed This Encounter  Procedures  . CBC with Differential/Platelet  . Ferritin  . Iron and TIBC  . Reticulocytes        Derek Jack, MD, 05/28/19 4:41 PM  [facility] [phone]   I, Jacqualyn Posey, am acting as a scribe for Dr. Sanda Linger.  I, Derek Jack MD, have reviewed the above documentation for accuracy and completeness, and I agree with the above.

## 2019-05-28 ENCOUNTER — Other Ambulatory Visit: Payer: Self-pay

## 2019-05-28 ENCOUNTER — Inpatient Hospital Stay (HOSPITAL_COMMUNITY): Payer: Medicare Other | Attending: Hematology | Admitting: Hematology

## 2019-05-28 DIAGNOSIS — D509 Iron deficiency anemia, unspecified: Secondary | ICD-10-CM | POA: Insufficient documentation

## 2019-05-28 DIAGNOSIS — D649 Anemia, unspecified: Secondary | ICD-10-CM | POA: Insufficient documentation

## 2019-05-28 LAB — OCCULT BLOOD X 1 CARD TO LAB, STOOL
Fecal Occult Bld: NEGATIVE
Fecal Occult Bld: POSITIVE — AB

## 2019-05-28 NOTE — Assessment & Plan Note (Addendum)
1.  Severe iron deficiency anemia: -CBC on 04/23/2019 shows white count 6.7, hemoglobin 8.3, MCV 71.7. -She has been anemic with hemoglobin below 10 consistently since October 2020. -Colonoscopy within the last 10 years by Dr. Laural Golden, apparently normal. -Denies any bleeding per rectum or melena.  No prior history of blood transfusion. -We reviewed her blood work from 05/07/2019.  Ferritin is severely low at 3.  123456, folic acid, methylmalonic acid and copper levels were normal.  SPEP was negative. -She could not tolerate oral iron because of gastric symptoms. -We talked about Feraheme weekly x2.  She does not have any severe drug allergies.  Hence we can proceed without premedication.  We talked about side effects including serious allergic reactions.  We will schedule her for Feraheme.  We will see her back in 6 weeks with repeat ferritin, iron panel, CBC.  2.  Rheumatoid arthritis: -She is on Enbrel weekly in the last 2 years along with Plaquenil twice daily. -Arthritis is fairly well controlled on the current regimen.  Predominantly involves the hands.  3.  Health maintenance: -Mammogram on 03/13/2019 was BI-RADS Category 1. -DEXA scan on 03/19/2019 shows T score -1.1.  Consistent with osteopenia.  Vitamin D level was normal at 30.23.

## 2019-05-28 NOTE — Patient Instructions (Signed)
[place] at [facility] Discharge Instructions  You were seen today by Dr. Delton Coombes. He went over your recent results. We will schedule you for two iron infusion, one week apart from one another. Dr. Delton Coombes will see you back in 6 weeks for labs and follow up.   Thank you for choosing [place] at [facility] to provide your oncology and hematology care.  To afford each patient quality time with our provider, please arrive at least 15 minutes before your scheduled appointment time.   If you have a lab appointment with the [facility] please come in thru the  Main Entrance and check in at the main information desk  You need to re-schedule your appointment should you arrive 10 or more minutes late.  We strive to give you quality time with our providers, and arriving late affects you and other patients whose appointments are after yours.  Also, if you no show three or more times for appointments you may be dismissed from the clinic at the providers discretion.     Again, thank you for choosing [facility].  Our hope is that these requests will decrease the amount of time that you wait before being seen by our physicians.       _____________________________________________________________  Should you have questions after your visit to [facility], please contact our office at [phone] between the hours of 8:00 a.m. and 4:30 p.m.  Voicemails left after 4:00 p.m. will not be returned until the following business day.  For prescription refill requests, have your pharmacy contact our office and allow 72 hours.    Cancer Center Support Programs:   > Cancer Support Group  2nd Tuesday of the month 1pm-2pm, Journey Room

## 2019-06-02 ENCOUNTER — Other Ambulatory Visit: Payer: Self-pay | Admitting: Rheumatology

## 2019-06-02 MED ORDER — ENBREL SURECLICK 50 MG/ML ~~LOC~~ SOAJ: 50.0000 mg | SUBCUTANEOUS | 0 refills | Status: DC

## 2019-06-02 NOTE — Telephone Encounter (Signed)
Patient left a voicemail stating [facility] [employer] needs a new prescription for her Enbrel.  Patient states they will be sending a fax to our office today.  Patient is requesting it be sent back ASAP so she can order her medication.

## 2019-06-02 NOTE — Telephone Encounter (Signed)
Advised patient that refill has been sent.

## 2019-06-02 NOTE — Telephone Encounter (Signed)
Last Visit: 04/01/2019 Next Visit: 09/04/2019 Labs: 04/23/2019 Creatinine is borderline elevated at 1.0 and GFR was 52. She continues to have chronic anemia. Her hemoglobin is low and continues to trend down. Hgb is 8. TB Gold: 11/05/2018  Okay to refill enbrel?

## 2019-06-02 NOTE — Telephone Encounter (Signed)
Ok to refill Enbrel

## 2019-06-06 ENCOUNTER — Other Ambulatory Visit: Payer: Self-pay

## 2019-06-06 ENCOUNTER — Encounter (HOSPITAL_COMMUNITY): Payer: Self-pay

## 2019-06-06 ENCOUNTER — Inpatient Hospital Stay (HOSPITAL_COMMUNITY): Payer: Medicare Other

## 2019-06-06 VITALS — BP 138/50 | HR 90 | Temp 97.5°F | Resp 18

## 2019-06-06 DIAGNOSIS — D509 Iron deficiency anemia, unspecified: Secondary | ICD-10-CM | POA: Diagnosis not present

## 2019-06-06 DIAGNOSIS — D649 Anemia, unspecified: Secondary | ICD-10-CM

## 2019-06-06 MED ORDER — SODIUM CHLORIDE 0.9 % IV SOLN
510.0000 mg | Freq: Once | INTRAVENOUS | Status: AC
  Administered 2019-06-06: 510 mg via INTRAVENOUS
  Filled 2019-06-06: qty 510

## 2019-06-06 MED ORDER — SODIUM CHLORIDE 0.9 % IV SOLN: Freq: Once | INTRAVENOUS | Status: AC

## 2019-06-06 NOTE — Progress Notes (Signed)
Erin Schmidt tolerated Feraheme infusion well without complaints or incident. Peripheral IV site checked with positive blood return noted prior to and after infusion. VSS Pt discharged self ambulatory in satisfactory condition

## 2019-06-06 NOTE — Patient Instructions (Signed)
[facility] at [facility] Discharge Instructions  Received Feraheme infusion today. Follow-up as scheduled   Thank you for choosing  [facility] at [facility] to provide your oncology and hematology care.  To afford each patient quality time with our provider, please arrive at least 15 minutes before your scheduled appointment time.   If you have a lab appointment with the Cancer Center please come in thru the Main Entrance and check in at the main information desk.  You need to re-schedule your appointment should you arrive 10 or more minutes late.  We strive to give you quality time with our providers, and arriving late affects you and other patients whose appointments are after yours.  Also, if you no show three or more times for appointments you may be dismissed from the clinic at the providers discretion.     Again, thank you for choosing [facility].  Our hope is that these requests will decrease the amount of time that you wait before being seen by our physicians.       _____________________________________________________________  Should you have questions after your visit to [facility], please contact our office at [phone] between the hours of 8:00 a.m. and 4:30 p.m.  Voicemails left after 4:00 p.m. will not be returned until the following business day.  For prescription refill requests, have your pharmacy contact our office and allow 72 hours.    Due to Covid, you will need to wear a mask upon entering the hospital. If you do not have a mask, a mask will be given to you at the Main Entrance upon arrival. For doctor visits, patients may have 1 support person with them. For treatment visits, patients can not have anyone with them due to social distancing guidelines and our immunocompromised population.     

## 2019-06-09 DIAGNOSIS — E039 Hypothyroidism, unspecified: Secondary | ICD-10-CM | POA: Diagnosis not present

## 2019-06-09 DIAGNOSIS — D649 Anemia, unspecified: Secondary | ICD-10-CM | POA: Diagnosis not present

## 2019-06-09 DIAGNOSIS — M069 Rheumatoid arthritis, unspecified: Secondary | ICD-10-CM | POA: Diagnosis not present

## 2019-06-09 DIAGNOSIS — M1991 Primary osteoarthritis, unspecified site: Secondary | ICD-10-CM | POA: Diagnosis not present

## 2019-06-13 ENCOUNTER — Ambulatory Visit (HOSPITAL_COMMUNITY): Payer: Medicare Other

## 2019-06-16 ENCOUNTER — Encounter (INDEPENDENT_AMBULATORY_CARE_PROVIDER_SITE_OTHER): Payer: Medicare Other | Admitting: Ophthalmology

## 2019-06-18 ENCOUNTER — Encounter (HOSPITAL_COMMUNITY): Payer: Self-pay

## 2019-06-18 ENCOUNTER — Inpatient Hospital Stay (HOSPITAL_COMMUNITY): Payer: Medicare Other | Attending: Hematology

## 2019-06-18 VITALS — BP 119/70 | HR 64 | Temp 97.5°F | Resp 18

## 2019-06-18 DIAGNOSIS — D649 Anemia, unspecified: Secondary | ICD-10-CM

## 2019-06-18 DIAGNOSIS — N189 Chronic kidney disease, unspecified: Secondary | ICD-10-CM | POA: Insufficient documentation

## 2019-06-18 DIAGNOSIS — D509 Iron deficiency anemia, unspecified: Secondary | ICD-10-CM | POA: Insufficient documentation

## 2019-06-18 MED ORDER — SODIUM CHLORIDE 0.9 % IV SOLN: Freq: Once | INTRAVENOUS | Status: DC | PRN

## 2019-06-18 MED ORDER — MORPHINE SULFATE (PF) 2 MG/ML IV SOLN
2.0000 mg | Freq: Once | INTRAVENOUS | Status: AC
  Administered 2019-06-18: 2 mg via INTRAVENOUS

## 2019-06-18 MED ORDER — METHYLPREDNISOLONE SODIUM SUCC 125 MG IJ SOLR
125.0000 mg | Freq: Once | INTRAMUSCULAR | Status: AC | PRN
  Administered 2019-06-18: 125 mg via INTRAVENOUS

## 2019-06-18 MED ORDER — MORPHINE SULFATE (PF) 2 MG/ML IV SOLN
INTRAVENOUS | Status: AC
  Filled 2019-06-18: qty 1

## 2019-06-18 MED ORDER — SODIUM CHLORIDE 0.9 % IV SOLN: Freq: Once | INTRAVENOUS | Status: AC

## 2019-06-18 MED ORDER — FAMOTIDINE IN NACL 20-0.9 MG/50ML-% IV SOLN
20.0000 mg | Freq: Once | INTRAVENOUS | Status: AC | PRN
  Administered 2019-06-18: 20 mg via INTRAVENOUS

## 2019-06-18 MED ORDER — SODIUM CHLORIDE 0.9 % IV SOLN
510.0000 mg | Freq: Once | INTRAVENOUS | Status: AC
  Administered 2019-06-18: 510 mg via INTRAVENOUS
  Filled 2019-06-18: qty 510

## 2019-06-18 MED ORDER — DIPHENHYDRAMINE HCL 50 MG/ML IJ SOLN
50.0000 mg | Freq: Once | INTRAMUSCULAR | Status: AC | PRN
  Administered 2019-06-18: 50 mg via INTRAVENOUS

## 2019-06-18 NOTE — Progress Notes (Signed)
1430-patient complained of abdominal pain, nausea, and tingling in left arm and hand.  Patients face red and stated she felt like she was going to pass out.  Also stated back pain.  Iron infusion stopped with NACL bolus started and oncologist with nurse practitioner notified.    1434-Randi Lockamy, NP, in the room with verbal orders for medications for patients complaints.  See MAR for details and flow sheet for vital signs.    1435-Dr. Delton Coombes in the room.    1456-patient stated a decrease in back pain.  See flow sheets for pain assessments.  Denied nausea and left arm tingling.  Resting with eyes closed. No s/s of distress noted.   1504-patient stated a dry mouth.  Ice chips given.  Denied nausea.  Resting with no facial grimacing.  No s/s of distress noted.     1551-Randi Lockamy, NP, in to the room to assess the patient with verbal order ok to be discharged.  No s/s of distress noted.   Patient discharged with verbal instructions when to return to the hospital for any complaints of back pain, nausea, abdominal pain and shortness of breath.  Patient and family verbalized understanding.  Peripheral IV with good blood return noted after infusion.  Patient discharged by wheelchair with no s/s of distress noted.

## 2019-06-26 ENCOUNTER — Ambulatory Visit (INDEPENDENT_AMBULATORY_CARE_PROVIDER_SITE_OTHER): Payer: Medicare Other | Admitting: Ophthalmology

## 2019-06-26 ENCOUNTER — Encounter (INDEPENDENT_AMBULATORY_CARE_PROVIDER_SITE_OTHER): Payer: Self-pay | Admitting: Ophthalmology

## 2019-06-26 ENCOUNTER — Other Ambulatory Visit: Payer: Self-pay

## 2019-06-26 DIAGNOSIS — H43823 Vitreomacular adhesion, bilateral: Secondary | ICD-10-CM

## 2019-06-26 DIAGNOSIS — H33102 Unspecified retinoschisis, left eye: Secondary | ICD-10-CM | POA: Diagnosis not present

## 2019-06-26 NOTE — Assessment & Plan Note (Signed)
Vitreomacular traction may cause vision loss from anatomic distortion to the center of the vision, the macula.  If visual function is symptomatic or threatened, therapy may be needed.  Surgical intervention offers the highest chance of visual stability and improvement.  Distortion of the macula anatomy may cause splitting of the retinal layers, termed foveomacular retinoschisis, which can cause more permanent vision loss.  Epiretinal membranes may also be associated.  Macular hole may also develop if vitreomacular traction progresses. The minor form of this condition is Vitreomacular adhesion, which is a natural change in the aging process of the eye, which requires observation only. 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  Still with excellent visual acuity.  I do recommend observe so as to first do no harm to the patient.  I explained the patient that preemptively vitrectomy has not been proven to be beneficial we should await for visual acuity decline because of the risk of secondary formation of macular hole with primary vitrectomy for nonvisual impacting VMT

## 2019-06-26 NOTE — Progress Notes (Signed)
06/26/2019     CHIEF COMPLAINT Patient presents for Retina Follow Up   HISTORY OF PRESENT ILLNESS: Erin Schmidt is a 86 y.o. female who presents to the clinic today for:   HPI    Retina Follow Up    Patient presents with  PVD.  In both eyes.  Duration of 3 months.  Since onset it is stable.          Comments    3 month follow up - OCT OU Patient denies change in vision and overall has no complaints.        Last edited by Gerda Diss on 06/26/2019  2:52 PM. (History)      Referring physician: Sharilyn Sites, MD [address] [place],  [place] [address]  HISTORICAL INFORMATION:   Selected notes from the MEDICAL RECORD NUMBER       CURRENT MEDICATIONS: No current outpatient medications on file. (Ophthalmic Drugs)   No current facility-administered medications for this visit. (Ophthalmic Drugs)   Current Outpatient Medications (Other)  Medication Sig  . benazepril-hydrochlorthiazide (LOTENSIN HCT) 20-12.5 MG per tablet Take 0.5 tablets by mouth daily.   . Calcium Carbonate-Vitamin D (CALCIUM + D PO) Take 1 tablet by mouth 2 (two) times daily.   . cetirizine (ZYRTEC) 10 MG tablet Take 10 mg by mouth daily.  Marland Kitchen escitalopram (LEXAPRO) 10 MG tablet Take 10 mg by mouth daily.  Marland Kitchen etanercept (ENBREL SURECLICK) 50 MG/ML injection Inject 0.98 mLs (50 mg total) into the skin once a week.  . hydroxychloroquine (PLAQUENIL) 200 MG tablet TAKE 1 TABLET BY MOUTH TWICE A DAY  . levothyroxine (SYNTHROID, LEVOTHROID) 112 MCG tablet Take 112 mcg by mouth daily before breakfast.   . meclizine (ANTIVERT) 25 MG tablet Take 25 mg by mouth 3 (three) times daily as needed for dizziness.  . traMADol-acetaminophen (ULTRACET) 37.5-325 MG tablet Take 1-2 tablets by mouth every 8 (eight) hours as needed.   No current facility-administered medications for this visit. (Other)      REVIEW OF SYSTEMS:    ALLERGIES Allergies  Allergen Reactions  . Feraheme [Ferumoxytol]      Reacted during infusion requiring medical intervention    PAST MEDICAL HISTORY Past Medical History:  Diagnosis Date  . Arthritis   . GERD (gastroesophageal reflux disease)   . Hyperlipemia   . Hypertension    preventative measures  . Hypothyroidism   . Migraine   . Thyroid disease   . Vertigo    Past Surgical History:  Procedure Laterality Date  . APPENDECTOMY    . BREAST BIOPSY Right    benign  . BREAST EXCISIONAL BIOPSY Left    Benign  . BREAST EXCISIONAL BIOPSY Left    Benign  . BREAST LUMPECTOMY  30 yrs ago   Dr. Milbert Coulter- rt breast  . BREAST LUMPECTOMY  15 yrs ago   left- Dr. Romona Curls  . BREAST LUMPECTOMY  01/2011  . CHOLECYSTECTOMY    . COLONOSCOPY    . COLONOSCOPY  04/13/2011   Procedure: COLONOSCOPY;  Surgeon: Rogene Houston, MD;  [facility]: [facility];  Service: Endoscopy;  Laterality: N/A;  930  . MASTECTOMY, PARTIAL  01/13/2011   Procedure: MASTECTOMY PARTIAL;  Surgeon: Adin Hector, MD;  Location: [facility];  Service: General;  Laterality: Left;  left partial mastectomy with needle locallization  . VAGINAL HYSTERECTOMY     2 partials    FAMILY HISTORY Family History  Problem Relation Age of Onset  .  Heart disease Mother   . Stroke Sister   . Heart disease Brother   . Atrial fibrillation Sister   . Coronary artery disease Brother   . Lung cancer Son   . Healthy Son   . Colon cancer Neg Hx     SOCIAL HISTORY Social History   Tobacco Use  . Smoking status: Former Smoker    Packs/day: 1.00    Years: 20.00    Pack years: 20.00    Types: Cigarettes    Quit date: 01/05/1989    Years since quitting: 30.4  . Smokeless tobacco: Never Used  Vaping Use  . Vaping Use: Never used  Substance Use Topics  . Alcohol use: No  . Drug use: Never         OPHTHALMIC EXAM:  Base Eye Exam    Visual Acuity (Snellen - Linear)      Right Left   Dist cc 20/25+1 20/25-1       Tonometry (Tonopen, 2:56 PM)      Right Left   Pressure 11  15       Pupils      Pupils Dark Light Shape React APD   Right PERRL 4 3 Round Slow None   Left PERRL 4 3 Round Slow None       Visual Fields (Counting fingers)      Left Right    Full Full       Extraocular Movement      Right Left    Full Full       Neuro/Psych    Oriented x3: Yes   Mood/Affect: Normal       Dilation    Both eyes: 1.0% Mydriacyl, 2.5% Phenylephrine @ 2:56 PM        Slit Lamp and Fundus Exam    External Exam      Right Left   External Normal Normal       Slit Lamp Exam      Right Left   Lids/Lashes Normal Normal   Conjunctiva/Sclera White and quiet White and quiet   Cornea Clear Clear   Anterior Chamber Deep and quiet Deep and quiet   Iris Round and reactive Round and reactive   Lens Posterior chamber intraocular lens Posterior chamber intraocular lens   Anterior Vitreous Normal Normal       Fundus Exam      Right Left   Posterior Vitreous Normal Normal   Disc Normal Normal   C/D Ratio  0.15   Vessels Normal Normal   Periphery Normal Normal          IMAGING AND PROCEDURES  Imaging and Procedures for 06/26/19  OCT, Retina - OU - Both Eyes       Right Eye Quality was good. Scan locations included subfoveal. Central Foveal Thickness: 363. Progression has been stable. Findings include vitreous traction.   Left Eye Quality was good. Scan locations included subfoveal. Central Foveal Thickness: 461. Progression has been stable. Findings include vitreous traction.   Notes Vitreal foveal traction with elevation of the inner lamina of the foveal region and inner foveal macular schisis.  No outer retinal holes.  Each eye is relatively stable with a broad area of attachment to the inner retina.  However with good visual acuity I do recommend continued observation.                ASSESSMENT/PLAN:  Vitreomacular adhesion of both eyes Vitreomacular traction may cause vision loss from anatomic distortion  to the center of the  vision, the macula.  If visual function is symptomatic or threatened, therapy may be needed.  Surgical intervention offers the highest chance of visual stability and improvement.  Distortion of the macula anatomy may cause splitting of the retinal layers, termed foveomacular retinoschisis, which can cause more permanent vision loss.  Epiretinal membranes may also be associated.  Macular hole may also develop if vitreomacular traction progresses. The minor form of this condition is Vitreomacular adhesion, which is a natural change in the aging process of the eye, which requires observation only. 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  Still with excellent visual acuity.  I do recommend observe so as to first do no harm to the patient.  I explained the patient that preemptively vitrectomy has not been proven to be beneficial we should await for visual acuity decline because of the risk of secondary formation of macular hole with primary vitrectomy for nonvisual impacting VMT       ICD-10-CM   1. Vitreomacular adhesion of both eyes  H43.823 OCT, Retina - OU - Both Eyes  2. Macular retinoschisis of left eye  H33.102     1.  2.  3.  Ophthalmic Meds Ordered this visit:  No orders of the defined types were placed in this encounter.      Return in about 6 months (around 12/26/2019) for DILATE OU, OCT.  There are no Patient Instructions on file for this visit.   Explained the diagnoses, plan, and follow up with the patient and they expressed understanding.  Patient expressed understanding of the importance of proper follow up care.   Clent Demark Elzy Tomasello M.D. Diseases & Surgery of the Retina and Vitreous [facility] [place] 06/26/19     Abbreviations: M myopia (nearsighted); A astigmatism; H hyperopia (farsighted); P presbyopia; Mrx spectacle prescription;  CTL contact lenses; OD right eye; OS left eye; OU both eyes  XT exotropia; ET esotropia; PEK  punctate epithelial keratitis; PEE punctate epithelial erosions; DES dry eye syndrome; MGD meibomian gland dysfunction; ATs artificial tears; PFAT's preservative free artificial tears; [place] nuclear sclerotic cataract; PSC posterior subcapsular cataract; ERM epi-retinal membrane; PVD posterior vitreous detachment; RD retinal detachment; DM diabetes mellitus; DR diabetic retinopathy; NPDR non-proliferative diabetic retinopathy; PDR proliferative diabetic retinopathy; CSME clinically significant macular edema; DME diabetic macular edema; dbh dot blot hemorrhages; CWS cotton wool spot; POAG primary open angle glaucoma; C/D cup-to-disc ratio; HVF humphrey visual field; GVF goldmann visual field; OCT optical coherence tomography; IOP intraocular pressure; BRVO Branch retinal vein occlusion; CRVO central retinal vein occlusion; CRAO central retinal artery occlusion; BRAO branch retinal artery occlusion; RT retinal tear; SB scleral buckle; PPV pars plana vitrectomy; VH Vitreous hemorrhage; PRP panretinal laser photocoagulation; IVK intravitreal kenalog; VMT vitreomacular traction; MH Macular hole;  NVD neovascularization of the disc; NVE neovascularization elsewhere; AREDS age related eye disease study; ARMD age related macular degeneration; POAG primary open angle glaucoma; EBMD epithelial/anterior basement membrane dystrophy; ACIOL anterior chamber intraocular lens; IOL intraocular lens; PCIOL posterior chamber intraocular lens; Phaco/IOL phacoemulsification with intraocular lens placement; Athena photorefractive keratectomy; LASIK laser assisted in situ keratomileusis; HTN hypertension; DM diabetes mellitus; COPD chronic obstructive pulmonary disease

## 2019-07-01 ENCOUNTER — Inpatient Hospital Stay (HOSPITAL_COMMUNITY): Payer: Medicare Other

## 2019-07-01 ENCOUNTER — Other Ambulatory Visit: Payer: Self-pay

## 2019-07-01 DIAGNOSIS — N189 Chronic kidney disease, unspecified: Secondary | ICD-10-CM | POA: Diagnosis not present

## 2019-07-01 DIAGNOSIS — D509 Iron deficiency anemia, unspecified: Secondary | ICD-10-CM | POA: Diagnosis not present

## 2019-07-01 DIAGNOSIS — D649 Anemia, unspecified: Secondary | ICD-10-CM

## 2019-07-01 LAB — CBC WITH DIFFERENTIAL/PLATELET
Abs Immature Granulocytes: 0.01 10*3/uL (ref 0.00–0.07)
Basophils Absolute: 0.1 10*3/uL (ref 0.0–0.1)
Basophils Relative: 1 %
Eosinophils Absolute: 0.1 10*3/uL (ref 0.0–0.5)
Eosinophils Relative: 2 %
HCT: 36.8 % (ref 36.0–46.0)
Hemoglobin: 10.8 g/dL — ABNORMAL LOW (ref 12.0–15.0)
Immature Granulocytes: 0 %
Lymphocytes Relative: 34 %
Lymphs Abs: 2 10*3/uL (ref 0.7–4.0)
MCH: 23.7 pg — ABNORMAL LOW (ref 26.0–34.0)
MCHC: 29.3 g/dL — ABNORMAL LOW (ref 30.0–36.0)
MCV: 80.9 fL (ref 80.0–100.0)
Monocytes Absolute: 0.5 10*3/uL (ref 0.1–1.0)
Monocytes Relative: 8 %
Neutro Abs: 3.1 10*3/uL (ref 1.7–7.7)
Neutrophils Relative %: 55 %
Platelets: 222 10*3/uL (ref 150–400)
RBC: 4.55 MIL/uL (ref 3.87–5.11)
RDW: 27.3 % — ABNORMAL HIGH (ref 11.5–15.5)
WBC: 5.8 10*3/uL (ref 4.0–10.5)
nRBC: 0 % (ref 0.0–0.2)

## 2019-07-01 LAB — RETICULOCYTES
Immature Retic Fract: 7.1 % (ref 2.3–15.9)
RBC.: 4.43 MIL/uL (ref 3.87–5.11)
Retic Count, Absolute: 39.9 10*3/uL (ref 19.0–186.0)
Retic Ct Pct: 0.9 % (ref 0.4–3.1)

## 2019-07-01 LAB — IRON AND TIBC
Iron: 59 ug/dL (ref 28–170)
Saturation Ratios: 14 % (ref 10.4–31.8)
TIBC: 423 ug/dL (ref 250–450)
UIBC: 364 ug/dL

## 2019-07-01 LAB — FERRITIN: Ferritin: 29 ng/mL (ref 11–307)

## 2019-07-08 ENCOUNTER — Ambulatory Visit (HOSPITAL_COMMUNITY): Payer: Medicare Other | Admitting: Oncology

## 2019-07-09 DIAGNOSIS — M1991 Primary osteoarthritis, unspecified site: Secondary | ICD-10-CM | POA: Diagnosis not present

## 2019-07-09 DIAGNOSIS — E039 Hypothyroidism, unspecified: Secondary | ICD-10-CM | POA: Diagnosis not present

## 2019-07-09 DIAGNOSIS — M069 Rheumatoid arthritis, unspecified: Secondary | ICD-10-CM | POA: Diagnosis not present

## 2019-07-09 DIAGNOSIS — D649 Anemia, unspecified: Secondary | ICD-10-CM | POA: Diagnosis not present

## 2019-07-09 DIAGNOSIS — E7849 Other hyperlipidemia: Secondary | ICD-10-CM | POA: Diagnosis not present

## 2019-07-11 ENCOUNTER — Inpatient Hospital Stay (HOSPITAL_COMMUNITY): Payer: Medicare Other | Attending: Hematology | Admitting: Oncology

## 2019-07-11 ENCOUNTER — Encounter (HOSPITAL_COMMUNITY): Payer: Self-pay | Admitting: Oncology

## 2019-07-11 VITALS — BP 155/78 | HR 54 | Temp 98.3°F | Resp 18 | Wt 149.5 lb

## 2019-07-11 DIAGNOSIS — M0609 Rheumatoid arthritis without rheumatoid factor, multiple sites: Secondary | ICD-10-CM | POA: Diagnosis not present

## 2019-07-11 DIAGNOSIS — H43823 Vitreomacular adhesion, bilateral: Secondary | ICD-10-CM | POA: Diagnosis not present

## 2019-07-11 DIAGNOSIS — H33102 Unspecified retinoschisis, left eye: Secondary | ICD-10-CM | POA: Diagnosis not present

## 2019-07-11 DIAGNOSIS — D500 Iron deficiency anemia secondary to blood loss (chronic): Secondary | ICD-10-CM

## 2019-07-11 NOTE — Patient Instructions (Signed)
[place] at [facility] Discharge Instructions  You were seen today by Kirby Crigler PA. He went over your recent lab results. He will schedule you for 1 dose of IV Iron. He will see you back in 8 weeks for labs and follow up.   Thank you for choosing [place] at [facility] to provide your oncology and hematology care.  To afford each patient quality time with our provider, please arrive at least 15 minutes before your scheduled appointment time.   If you have a lab appointment with the [facility] please come in thru the  Main Entrance and check in at the main information desk  You need to re-schedule your appointment should you arrive 10 or more minutes late.  We strive to give you quality time with our providers, and arriving late affects you and other patients whose appointments are after yours.  Also, if you no show three or more times for appointments you may be dismissed from the clinic at the providers discretion.     Again, thank you for choosing [facility].  Our hope is that these requests will decrease the amount of time that you wait before being seen by our physicians.       _____________________________________________________________  Should you have questions after your visit to [facility], please contact our office at [phone] between the hours of 8:00 a.m. and 4:30 p.m.  Voicemails left after 4:00 p.m. will not be returned until the following business day.  For prescription refill requests, have your pharmacy contact our office and allow 72 hours.    Cancer Center Support Programs:   > Cancer Support Group  2nd Tuesday of the month 1pm-2pm, Journey Room

## 2019-07-11 NOTE — Progress Notes (Signed)
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 -She has been anemic with hemoglobin below 10 consistently since October 2020. -Colonoscopy within the last 10 years by Dr. Laural Golden, apparently normal. -Denies any bleeding per rectum or melena.  No prior history of blood transfusion. -B12, folate, copper, and multiple myeloma screen all normal.  + stool card in 05/2019. -She could not tolerate oral iron because of gastric symptoms. -S/P Feraheme x 1.  COMPLICATED by reaction to Feraheme #2.  CURRENT STATUS: Erin Schmidt 86 y.o. female returns for followup of iron deficiency anemia presumed to be secondary to chronic GI blood loss with occult stool card + have required IV iron replacement therapy complicated by reaction to Feraheme #2.  She is doing well overall.  She explains to me for anaphylactic reaction to her most recent Feraheme.  In accordance with anaphylaxis protocol.  She did provide education regarding the fact that she is not allergic to iron but rather allergic to what ever coating of the iron that is involved in the preparation of the IV iron.  As result, she is a candidate for future IV iron, just a different IV iron preparation.  She denies any new lumps or bumps in her examinations.  No new pain.  She denies blood in her stools or black stools.  No gross hematuria, abnormal vaginal bleeding, or hemoptysis.  Appetite is good and weight is stable.  No abdominal bloating or discomfort.  She told me the story of her  son's death and her husband's recent illness related to cardiac issues.  Review of Systems  Constitutional: Negative.  Negative for chills, fever and weight loss.  HENT: Negative.   Eyes: Negative.   Respiratory: Negative.  Negative for cough.   Cardiovascular: Negative.  Negative for chest pain.  Gastrointestinal: Negative.  Negative for blood in stool, constipation, diarrhea, melena, nausea and vomiting.  Genitourinary: Negative.   Musculoskeletal: Negative.   Skin: Negative.   Neurological: Negative.  Negative for weakness.  Endo/Heme/Allergies: Negative.   Psychiatric/Behavioral: Negative.     Past Medical History:  Diagnosis Date  . Arthritis   . GERD (gastroesophageal reflux disease)   . Hyperlipemia   . Hypertension    preventative measures  . Hypothyroidism   . Iron deficiency anemia 05/07/2019  . Migraine   . Thyroid disease   . Vertigo      PHYSICAL EXAMINATION  ECOG PERFORMANCE STATUS: 1 - Symptomatic but completely ambulatory  Vitals:   07/11/19 0904  BP: (!) 155/78  Pulse: (!) 54  Resp: 18  Temp: 98.3 F (36.8 C)  SpO2: 98%    GENERAL:alert, healthy, no distress, well nourished, well developed, comfortable, cooperative, smiling and unaccompanied SKIN: skin color, texture, turgor are normal, no rashes or significant lesions HEAD: Normocephalic, No masses, lesions, tenderness or abnormalities EYES: normal, EOMI, Conjunctiva are pink and non-injected EARS: External ears normal OROPHARYNX:not examined, mask in place  NECK: supple LYMPH:  no palpable lymphadenopathy BREAST:not examined LUNGS: clear to auscultation  HEART: regular rate & rhythm, no murmurs and no gallops  ABDOMEN:abdomen soft, non-tender and normal bowel sounds BACK: Back symmetric, no curvature. EXTREMITIES:less then 2 second capillary refill, no skin discoloration, no cyanosis  NEURO: alert & oriented x 3 with fluent speech, no focal motor/sensory deficits, gait  normal   LABORATORY DATA: CBC    Component Value Date/Time   WBC 5.8 07/01/2019 1323   RBC 4.55 07/01/2019 1323   RBC 4.43 07/01/2019 1323   HGB 10.8 (L) 07/01/2019 1323   HCT 36.8 07/01/2019 1323   PLT 222 07/01/2019 1323   MCV 80.9 07/01/2019 1323   MCH 23.7 (L) 07/01/2019 1323   MCHC 29.3 (L) 07/01/2019 1323   RDW 27.3 (H) 07/01/2019 1323   LYMPHSABS 2.0 07/01/2019 1323   MONOABS 0.5 07/01/2019 1323   EOSABS 0.1 07/01/2019 1323   BASOSABS 0.1 07/01/2019 1323      Chemistry      Component Value Date/Time   NA 141 04/23/2019 1355   K 4.3 04/23/2019 1355   CL 106 04/23/2019 1355   CO2 26 04/23/2019 1355   BUN 25 04/23/2019 1355   CREATININE 1.00 (H) 04/23/2019 1355      Component Value Date/Time   CALCIUM 8.8 04/23/2019 1355   ALKPHOS 62 08/21/2016 0922   AST 19 04/23/2019 1355   ALT 11 04/23/2019 1355   BILITOT 0.4 04/23/2019 1355       RADIOGRAPHIC STUDIES:  OCT, Retina - OU - Both Eyes  Result Date: 06/26/2019 Right Eye Quality was good. Scan locations included subfoveal. Central Foveal Thickness: 363. Progression has been stable. Findings include vitreous traction. Left Eye Quality was good. Scan locations included subfoveal. Central Foveal Thickness: 461. Progression has been stable. Findings include vitreous traction. Notes Vitreal foveal traction with elevation of the inner lamina of the foveal region and inner foveal macular schisis.  No outer retinal holes.  Each eye is relatively stable with a broad area of attachment to the inner retina.  However with good visual acuity I do recommend continued observation.      ASSESSMENT AND PLAN:  1. Iron deficiency anemia due to chronic blood loss -CBC on 04/23/2019 shows white count 6.7, hemoglobin 8.3, MCV 71.7. -She has been anemic with hemoglobin below 10 consistently since October 2020. -Colonoscopy within the last 10 years by Dr. Laural Golden, apparently normal. -Denies any bleeding per rectum or melena.  No  prior history of blood transfusion. -B12, folate, copper, and multiple myeloma screen all normal.  + stool card in 05/2019. -She could not tolerate oral iron because of gastric symptoms. -S/P Feraheme x 1.  COMPLICATED by reaction to Feraheme #2.  Labs updated on 07/01/2019: Hemoglobin improved to 10.8 g/dL with ongoing anemia noted.  MCV within normal limits but MCHC minimally low.  White blood cell count 5.8 with normal differential and platelet count 222,000.  Ferritin improved to 29 but TIBC remains at the upper limits of normal.  We will proceed with 1 dose of IV Injectafer 750 mg.  Calculated iron deficit is 465 mg.  She had reaction to [facility] #2 and therefore I will give premeds prior to [facility] to prevent complication moving forward.  Treatment plan built with pre-meds.  Once iron is corrected, if iron deficiency recurs, she will need to be evaluated by GI.  Labs in 6 weeks: CBC diff, iron/TIBC, ferritin.    Return in 6 weeks for follow-up.  Iron deficiency anemia is the most common anemia.  Beside playing a critical role as an oxygen carrier in the heme group of hemoglobin,  iron is found in many key proteins in the cells, such as cytochromes and myoglobin, so it is not unexpected that a lack of iron has effects other than anemia.  Three studies have focused on nonanemic iron deficiency leading to fatigue.  Two studies showed that oral iron supplementation reduces fatigue, with no significant change in hemoglobin levels, in women with a ferritin level of less than 50 ng/mL, and a third study showed a lessening of fatigue with parental iron administration in women with a ferritin level of 15 ng/mL or less or an iron saturation of 20% or less.   Owing to obligate iron loss through menses, women are at greater risk for iron deficiency than men.  Iron loss in all women averages 1-3 ng per day, and dietary intake is often inadequate to maintain a positive iron balance.  A 1967 study showed  that 25% of healthy, college-age women had no bone marrow iron stores and that another 33% had low stores.  Pregnancy adds to demands for iron, with requirements increasing to 6 ng per day by the end of pregnancy.  Athletes are another group at risk for iron deficiency.  Gastrointestinal tract blood is the source of iron loss, and exercise-induced hemolysis leads to urinary iron losses.  Decreased absorption of iron has also been implicated as a cause of iron deficiency, because of levels of hepcidin are often elevated in athletes owing to training-induced inflammation.    Obesity and its surgical treatment are also at risk factors for iron deficiency.  Obese patients are often iron-deficient, with increased hepcidin level being implicated in decreased absorption.  After bariatric surgery, the incidence of iron deficiency can be as high as 50%.  Because the main site of iron absorption is the duodenum, surgeries that involve bypassing this part of the bowel are associated with an increased incidence of iron deficiency.  However, iron deficiency is seen as a sequela of most types of bariatric surgery.    -NEJM Volume 371, No 14, pg 1325-1326   2. 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 Vitreomacular adhesion of both eyes Managed by Dr. Zadie Rhine  4. Macular retinoschisis of left eye Managed by Dr. Zadie Rhine.   ORDERS PLACED FOR THIS ENCOUNTER: Orders Placed This Encounter  Procedures  . CBC with Differential  . Comprehensive metabolic panel  . Iron and TIBC  . Ferritin  . Reticulocytes    MEDICATIONS PRESCRIBED THIS ENCOUNTER: No orders of the defined types were placed in this encounter.   All questions were answered. The patient knows to call the clinic with any problems, questions or concerns. We can certainly see the patient much sooner if necessary.  Patient and plan discussed with Dr. Derek Jack and he is in  agreement with the aforementioned.   This note is electronically signed by: Robynn Pane, PA-C 07/11/2019 2:16 PM

## 2019-07-16 ENCOUNTER — Other Ambulatory Visit: Payer: Self-pay | Admitting: Rheumatology

## 2019-07-16 DIAGNOSIS — M0609 Rheumatoid arthritis without rheumatoid factor, multiple sites: Secondary | ICD-10-CM

## 2019-07-16 NOTE — Telephone Encounter (Signed)
Last Visit: 04/01/2019 Next Visit: 09/04/2019 Labs: 04/23/2019 Creatinine is borderline elevated at 1.0 and GFR was 52. She continues to have chronic anemia. Her hemoglobin is low and continues to trend down. Hgb is 8. CBC: 07/01/2019 Hgb 10.8 PLQ Eye Exam: WNL 09/03/18   Current Dose per office note on 04/01/2019: Plaquenil 200 mg 1 tablet twice daily  Okay to refill PLQ?

## 2019-07-16 NOTE — Telephone Encounter (Signed)
Patient advised she is due to update her PLQ eye exam in August 2021.

## 2019-07-16 NOTE — Telephone Encounter (Signed)
Attempted to contact the patient and left message for patient to call the office.  

## 2019-07-16 NOTE — Telephone Encounter (Signed)
Please notify the patient that she is due to update PLQ eye exam in August 2021.   Ok to refill PLQ.

## 2019-07-18 ENCOUNTER — Other Ambulatory Visit: Payer: Self-pay

## 2019-07-18 ENCOUNTER — Encounter (HOSPITAL_COMMUNITY): Payer: Self-pay

## 2019-07-18 ENCOUNTER — Inpatient Hospital Stay (HOSPITAL_COMMUNITY): Payer: Medicare Other

## 2019-07-18 VITALS — BP 166/83 | HR 60 | Temp 97.8°F | Resp 18

## 2019-07-18 DIAGNOSIS — D500 Iron deficiency anemia secondary to blood loss (chronic): Secondary | ICD-10-CM

## 2019-07-18 MED ORDER — METHYLPREDNISOLONE SODIUM SUCC 125 MG IJ SOLR
60.0000 mg | Freq: Once | INTRAMUSCULAR | Status: AC
  Administered 2019-07-18: 60 mg via INTRAVENOUS
  Filled 2019-07-18: qty 2

## 2019-07-18 MED ORDER — DIPHENHYDRAMINE HCL 50 MG/ML IJ SOLN
25.0000 mg | Freq: Once | INTRAMUSCULAR | Status: AC
  Administered 2019-07-18: 25 mg via INTRAVENOUS
  Filled 2019-07-18: qty 1

## 2019-07-18 MED ORDER — FAMOTIDINE IN NACL 20-0.9 MG/50ML-% IV SOLN
20.0000 mg | Freq: Once | INTRAVENOUS | Status: AC
  Administered 2019-07-18: 20 mg via INTRAVENOUS
  Filled 2019-07-18: qty 50

## 2019-07-18 MED ORDER — SODIUM CHLORIDE 0.9 % IV SOLN: Freq: Once | INTRAVENOUS | Status: AC

## 2019-07-18 MED ORDER — SODIUM CHLORIDE 0.9 % IV SOLN
750.0000 mg | Freq: Once | INTRAVENOUS | Status: AC
  Administered 2019-07-18: 750 mg via INTRAVENOUS
  Filled 2019-07-18: qty 15

## 2019-07-18 NOTE — Patient Instructions (Signed)
[place] at [facility] Discharge Instructions  Received [facility] today. Follow-up as scheduled   Thank you for choosing [place] at [facility] to provide your oncology and hematology care.  To afford each patient quality time with our provider, please arrive at least 15 minutes before your scheduled appointment time.   If you have a lab appointment with the [place] please come in thru the Main Entrance and check in at the main information desk.  You need to re-schedule your appointment should you arrive 10 or more minutes late.  We strive to give you quality time with our providers, and arriving late affects you and other patients whose appointments are after yours.  Also, if you no show three or more times for appointments you may be dismissed from the clinic at the providers discretion.     Again, [facility] you for choosing [facility].  Our hope is that these requests will decrease the amount of time that you wait before being seen by our physicians.       _____________________________________________________________  Should you have questions after your visit to [facility], please contact our office at [phone] between the hours of 8:00 a.m. and 4:30 p.m.  Voicemails left after 4:00 p.m. will not be returned until the following business day.  For prescription refill requests, have your pharmacy contact our office and allow 72 hours.    Due to Covid, you will need to wear a mask upon entering the hospital. If you do not have a mask, a mask will be given to you at the Main Entrance upon arrival. For doctor visits, patients may have 1 support person with them. For treatment visits, patients can not have anyone with them due to social distancing guidelines and our immunocompromised population.

## 2019-07-18 NOTE — Progress Notes (Signed)
[name] tolerated Injectafer infusion well without complaints or incident. Peripheral IV site checked with positive blood return noted prior to and after infusion. VSS upon discharge. Pt discharged self ambulatory in satisfactory condition

## 2019-07-23 ENCOUNTER — Other Ambulatory Visit (HOSPITAL_COMMUNITY): Payer: Self-pay | Admitting: Endocrinology

## 2019-07-23 ENCOUNTER — Other Ambulatory Visit: Payer: Self-pay | Admitting: Endocrinology

## 2019-07-23 DIAGNOSIS — E049 Nontoxic goiter, unspecified: Secondary | ICD-10-CM

## 2019-07-28 ENCOUNTER — Telehealth: Payer: Self-pay | Admitting: Rheumatology

## 2019-07-28 ENCOUNTER — Other Ambulatory Visit: Payer: Self-pay

## 2019-07-28 DIAGNOSIS — Z79899 Other long term (current) drug therapy: Secondary | ICD-10-CM | POA: Diagnosis not present

## 2019-07-28 DIAGNOSIS — D225 Melanocytic nevi of trunk: Secondary | ICD-10-CM | POA: Diagnosis not present

## 2019-07-28 DIAGNOSIS — L814 Other melanin hyperpigmentation: Secondary | ICD-10-CM | POA: Diagnosis not present

## 2019-07-28 DIAGNOSIS — L821 Other seborrheic keratosis: Secondary | ICD-10-CM | POA: Diagnosis not present

## 2019-07-28 DIAGNOSIS — D1801 Hemangioma of skin and subcutaneous tissue: Secondary | ICD-10-CM | POA: Diagnosis not present

## 2019-07-28 NOTE — Telephone Encounter (Signed)
Spoke with patient and advised that we have not refilled her Enbrel as it is to early for refill. Patient advised that the lab work performed did not include a CMP and we would need her to have that performed. Patient states she will come this week.

## 2019-07-28 NOTE — Telephone Encounter (Signed)
Patient called checking if she needed labwork before her appointment on 09/03/19.  Patient states she has had labwork with Dr. Delton Coombes and received iron infusion.  Patient also stated that she called [facility] for a refill of her Enbrel by mistake.  Patient states she does still have 4 injections remaining.

## 2019-07-29 LAB — COMPLETE METABOLIC PANEL WITH GFR
AG Ratio: 1.4 (calc) (ref 1.0–2.5)
ALT: 14 U/L (ref 6–29)
AST: 22 U/L (ref 10–35)
Albumin: 4.1 g/dL (ref 3.6–5.1)
Alkaline phosphatase (APISO): 65 U/L (ref 37–153)
BUN/Creatinine Ratio: 19 (calc) (ref 6–22)
BUN: 20 mg/dL (ref 7–25)
CO2: 29 mmol/L (ref 20–32)
Calcium: 8.9 mg/dL (ref 8.6–10.4)
Chloride: 103 mmol/L (ref 98–110)
Creat: 1.03 mg/dL — ABNORMAL HIGH (ref 0.60–0.88)
GFR, Est African American: 57 mL/min/{1.73_m2} — ABNORMAL LOW (ref >=60)
GFR, Est Non African American: 50 mL/min/{1.73_m2} — ABNORMAL LOW (ref >=60)
Globulin: 2.9 g/dL (calc) (ref 1.9–3.7)
Glucose, Bld: 82 mg/dL (ref 65–99)
Potassium: 4 mmol/L (ref 3.5–5.3)
Sodium: 140 mmol/L (ref 135–146)
Total Bilirubin: 0.4 mg/dL (ref 0.2–1.2)
Total Protein: 7 g/dL (ref 6.1–8.1)

## 2019-07-29 LAB — CBC WITH DIFFERENTIAL/PLATELET
Absolute Monocytes: 541 cells/uL (ref 200–950)
Basophils Absolute: 32 cells/uL (ref 0–200)
Basophils Relative: 0.6 %
Eosinophils Absolute: 201 cells/uL (ref 15–500)
Eosinophils Relative: 3.8 %
HCT: 36.8 % (ref 35.0–45.0)
Hemoglobin: 11.8 g/dL (ref 11.7–15.5)
Lymphs Abs: 1696 cells/uL (ref 850–3900)
MCH: 26.3 pg — ABNORMAL LOW (ref 27.0–33.0)
MCHC: 32.1 g/dL (ref 32.0–36.0)
MCV: 82.1 fL (ref 80.0–100.0)
MPV: 10.6 fL (ref 7.5–12.5)
Monocytes Relative: 10.2 %
Neutro Abs: 2830 cells/uL (ref 1500–7800)
Neutrophils Relative %: 53.4 %
Platelets: 196 10*3/uL (ref 140–400)
RBC: 4.48 10*6/uL (ref 3.80–5.10)
Total Lymphocyte: 32 %
WBC: 5.3 10*3/uL (ref 3.8–10.8)

## 2019-07-29 NOTE — Progress Notes (Signed)
Creatinine is mildly elevated. GFR is 50. Please advise the patient to avoid taking NSAIDs.  Hgb and Hct are WNL.  Anemia has improved significantly.

## 2019-08-01 ENCOUNTER — Other Ambulatory Visit: Payer: Self-pay

## 2019-08-01 ENCOUNTER — Ambulatory Visit (HOSPITAL_COMMUNITY)
Admission: RE | Admit: 2019-08-01 | Discharge: 2019-08-01 | Disposition: A | Discharge status: Home / Self Care | Payer: Medicare Other | Source: Ambulatory Visit | Attending: Endocrinology | Admitting: Endocrinology

## 2019-08-01 DIAGNOSIS — E042 Nontoxic multinodular goiter: Secondary | ICD-10-CM | POA: Diagnosis not present

## 2019-08-01 DIAGNOSIS — E049 Nontoxic goiter, unspecified: Secondary | ICD-10-CM | POA: Insufficient documentation

## 2019-08-08 DIAGNOSIS — E7849 Other hyperlipidemia: Secondary | ICD-10-CM | POA: Diagnosis not present

## 2019-08-08 DIAGNOSIS — M1991 Primary osteoarthritis, unspecified site: Secondary | ICD-10-CM | POA: Diagnosis not present

## 2019-08-08 DIAGNOSIS — E039 Hypothyroidism, unspecified: Secondary | ICD-10-CM | POA: Diagnosis not present

## 2019-08-08 DIAGNOSIS — E069 Thyroiditis, unspecified: Secondary | ICD-10-CM | POA: Diagnosis not present
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
## 2019-09-01 ENCOUNTER — Other Ambulatory Visit: Payer: Self-pay | Admitting: Rheumatology

## 2019-09-01 MED ORDER — ENBREL SURECLICK 50 MG/ML ~~LOC~~ SOAJ: 50.0000 mg | SUBCUTANEOUS | 0 refills | Status: DC

## 2019-09-01 NOTE — Telephone Encounter (Signed)
Last Visit:04/01/2019 Next Visit:09/04/2019 Labs:07/28/2019 Creatinine is mildly elevated. GFR is 50. Hgb and Hct are WNL. Anemia has improved  TB gold: 11/05/2018 Neg   Current Dose per office note on 7/49/3552: Enbrel Sureclick 50 mg every 7 days  DX: Rheumatoid arthritis of multiple sites without rheumatoid factor   Okay to refill Enbrel?

## 2019-09-01 NOTE — Telephone Encounter (Signed)
Patient requesting a new RX for Enbrel sent to [facility]. Per patient, [facility] will fax a form for Rx.

## 2019-09-02 ENCOUNTER — Ambulatory Visit: Payer: Medicare Other | Admitting: Physician Assistant

## 2019-09-02 DIAGNOSIS — H43823 Vitreomacular adhesion, bilateral: Secondary | ICD-10-CM | POA: Diagnosis not present

## 2019-09-02 DIAGNOSIS — M069 Rheumatoid arthritis, unspecified: Secondary | ICD-10-CM | POA: Diagnosis not present

## 2019-09-02 DIAGNOSIS — Z961 Presence of intraocular lens: Secondary | ICD-10-CM | POA: Diagnosis not present

## 2019-09-02 DIAGNOSIS — Z79899 Other long term (current) drug therapy: Secondary | ICD-10-CM | POA: Diagnosis not present

## 2019-09-02 DIAGNOSIS — H04123 Dry eye syndrome of bilateral lacrimal glands: Secondary | ICD-10-CM | POA: Diagnosis not present

## 2019-09-04 ENCOUNTER — Ambulatory Visit: Payer: Self-pay

## 2019-09-04 ENCOUNTER — Ambulatory Visit: Payer: Medicare Other | Admitting: Physician Assistant

## 2019-09-04 ENCOUNTER — Other Ambulatory Visit: Payer: Self-pay

## 2019-09-04 ENCOUNTER — Telehealth: Payer: Self-pay

## 2019-09-04 ENCOUNTER — Encounter: Payer: Self-pay | Admitting: Physician Assistant

## 2019-09-04 VITALS — BP 139/59 | HR 40 | Resp 14 | Ht 66.0 in | Wt 152.6 lb

## 2019-09-04 DIAGNOSIS — G5702 Lesion of sciatic nerve, left lower limb: Secondary | ICD-10-CM

## 2019-09-04 DIAGNOSIS — M170 Bilateral primary osteoarthritis of knee: Secondary | ICD-10-CM | POA: Diagnosis not present

## 2019-09-04 DIAGNOSIS — M0609 Rheumatoid arthritis without rheumatoid factor, multiple sites: Secondary | ICD-10-CM

## 2019-09-04 DIAGNOSIS — Z8679 Personal history of other diseases of the circulatory system: Secondary | ICD-10-CM

## 2019-09-04 DIAGNOSIS — M19072 Primary osteoarthritis, left ankle and foot: Secondary | ICD-10-CM

## 2019-09-04 DIAGNOSIS — M19042 Primary osteoarthritis, left hand: Secondary | ICD-10-CM

## 2019-09-04 DIAGNOSIS — Z79899 Other long term (current) drug therapy: Secondary | ICD-10-CM | POA: Diagnosis not present

## 2019-09-04 DIAGNOSIS — M19071 Primary osteoarthritis, right ankle and foot: Secondary | ICD-10-CM | POA: Diagnosis not present

## 2019-09-04 DIAGNOSIS — Z8659 Personal history of other mental and behavioral disorders: Secondary | ICD-10-CM

## 2019-09-04 DIAGNOSIS — M19041 Primary osteoarthritis, right hand: Secondary | ICD-10-CM

## 2019-09-04 DIAGNOSIS — Z8639 Personal history of other endocrine, nutritional and metabolic disease: Secondary | ICD-10-CM

## 2019-09-04 NOTE — Patient Instructions (Signed)
COVID-19 vaccine recommendations:   COVID-19 vaccine is recommended for everyone (unless you are allergic to a vaccine component), even if you are on a medication that suppresses your immune system.   If you are on Methotrexate, Cellcept (mycophenolate), Rinvoq, Morrie Sheldon, and Olumiant- hold the medication for 1 week after each vaccine. Hold Methotrexate for 2 weeks after the single dose COVID-19 vaccine.   If you are on Orencia subcutaneous injection - hold medication one week prior to and one week after the first COVID-19 vaccine dose (only).   If you are on Orencia IV infusions- time vaccination administration so that the first COVID-19 vaccination will occur four weeks after the infusion and postpone the subsequent infusion by one week.   If you are on Cyclophosphamide or Rituxan infusions please contact your doctor prior to receiving the COVID-19 vaccine.   Do not take Tylenol or any anti-inflammatory medications (NSAIDs) 24 hours prior to the COVID-19 vaccination.   There is no direct evidence about the efficacy of the COVID-19 vaccine in individuals who are on medications that suppress the immune system.   Even if you are fully vaccinated, and you are on any medications that suppress your immune system, please continue to wear a mask, maintain at least six feet social distance and practice hand hygiene.   If you develop a COVID-19 infection, please contact your PCP or our office to determine if you need antibody infusion.  The booster vaccine is now available for immunocompromised patients. It is advised that if you had Pfizer vaccine you should get [employer] booster.  If you had a Moderna vaccine then you should get a Moderna booster. Johnson and Wynetta Emery does not have a booster vaccine at this time.  Please see the following web sites for updated information.    [email or web address]://[email or web address]/[email or web address]/Files/[email or web address]-Vaccination-Patient-Resources.[email or web address]  [email or web address]://[email or web address]/About-[email or web address]-Releases/ID/1159    Standing Labs We placed an order today for your standing lab work.   Please have your standing labs drawn in October and every 3 months   If possible, please have your labs drawn 2 weeks prior to your appointment so that the provider can discuss your results at your appointment.  We have open lab daily Monday through Thursday from 8:30-12:30 PM and 1:30-4:30 PM and Friday from 8:30-12:30 PM and 1:30-4:00 PM at the office of Dr. Bo Merino, [facility].   Please be advised, patients with office appointments requiring lab work will take precedents over walk-in lab work.  If possible, please come for your lab work on Monday and Friday afternoons, as you may experience shorter wait times. The office is located at [address], [place], [place], [place] [address] No appointment is necessary.   Labs are drawn by [facility]. Please bring your co-pay at the time of your lab draw.  You may receive a bill from [facility] for your lab work.  If you wish to have your labs drawn at another location, please call the office 24 hours in advance to send orders.  If you have any questions regarding directions or hours of operation,  please call [phone].   As a reminder, please drink plenty of water prior to coming for your lab work. Thanks!

## 2019-09-04 NOTE — Telephone Encounter (Signed)
Attempted to contact patient and left message on machine to advise patient of xray results from her appointment today.

## 2019-09-05 ENCOUNTER — Inpatient Hospital Stay (HOSPITAL_COMMUNITY): Payer: Medicare Other | Attending: Hematology

## 2019-09-05 ENCOUNTER — Other Ambulatory Visit: Payer: Self-pay

## 2019-09-05 DIAGNOSIS — D509 Iron deficiency anemia, unspecified: Secondary | ICD-10-CM | POA: Insufficient documentation

## 2019-09-05 DIAGNOSIS — D500 Iron deficiency anemia secondary to blood loss (chronic): Secondary | ICD-10-CM

## 2019-09-05 LAB — FERRITIN: Ferritin: 54 ng/mL (ref 11–307)

## 2019-09-05 LAB — CBC WITH DIFFERENTIAL/PLATELET
Abs Immature Granulocytes: 0.01 10*3/uL (ref 0.00–0.07)
Basophils Absolute: 0 10*3/uL (ref 0.0–0.1)
Basophils Relative: 1 %
Eosinophils Absolute: 0.2 10*3/uL (ref 0.0–0.5)
Eosinophils Relative: 4 %
HCT: 39.4 % (ref 36.0–46.0)
Hemoglobin: 12.5 g/dL (ref 12.0–15.0)
Immature Granulocytes: 0 %
Lymphocytes Relative: 34 %
Lymphs Abs: 1.6 10*3/uL (ref 0.7–4.0)
MCH: 28.8 pg (ref 26.0–34.0)
MCHC: 31.7 g/dL (ref 30.0–36.0)
MCV: 90.8 fL (ref 80.0–100.0)
Monocytes Absolute: 0.4 10*3/uL (ref 0.1–1.0)
Monocytes Relative: 9 %
Neutro Abs: 2.5 10*3/uL (ref 1.7–7.7)
Neutrophils Relative %: 52 %
Platelets: 186 10*3/uL (ref 150–400)
RBC: 4.34 MIL/uL (ref 3.87–5.11)
RDW: 17.9 % — ABNORMAL HIGH (ref 11.5–15.5)
WBC: 4.8 10*3/uL (ref 4.0–10.5)
nRBC: 0 % (ref 0.0–0.2)

## 2019-09-05 LAB — COMPREHENSIVE METABOLIC PANEL
ALT: 18 U/L (ref 0–44)
AST: 24 U/L (ref 15–41)
Albumin: 4 g/dL (ref 3.5–5.0)
Alkaline Phosphatase: 60 U/L (ref 38–126)
Anion gap: 9 (ref 5–15)
BUN: 22 mg/dL (ref 8–23)
CO2: 26 mmol/L (ref 22–32)
Calcium: 9 mg/dL (ref 8.9–10.3)
Chloride: 102 mmol/L (ref 98–111)
Creatinine, Ser: 1.07 mg/dL — ABNORMAL HIGH (ref 0.44–1.00)
GFR calc Af Amer: 55 mL/min — ABNORMAL LOW (ref >60)
GFR calc non Af Amer: 47 mL/min — ABNORMAL LOW (ref >60)
Glucose, Bld: 89 mg/dL (ref 70–99)
Potassium: 3.9 mmol/L (ref 3.5–5.1)
Sodium: 137 mmol/L (ref 135–145)
Total Bilirubin: 0.7 mg/dL (ref 0.3–1.2)
Total Protein: 6.7 g/dL (ref 6.5–8.1)

## 2019-09-05 LAB — RETICULOCYTES
Immature Retic Fract: 3.3 % (ref 2.3–15.9)
RBC.: 4.34 MIL/uL (ref 3.87–5.11)
Retic Count, Absolute: 39.5 10*3/uL (ref 19.0–186.0)
Retic Ct Pct: 0.9 % (ref 0.4–3.1)

## 2019-09-05 LAB — IRON AND TIBC
Iron: 57 ug/dL (ref 28–170)
Saturation Ratios: 17 % (ref 10.4–31.8)
TIBC: 336 ug/dL (ref 250–450)
UIBC: 279 ug/dL

## 2019-09-09 DIAGNOSIS — E039 Hypothyroidism, unspecified: Secondary | ICD-10-CM | POA: Diagnosis not present

## 2019-09-09 DIAGNOSIS — M069 Rheumatoid arthritis, unspecified: Secondary | ICD-10-CM | POA: Diagnosis not present

## 2019-09-09 DIAGNOSIS — I10 Essential (primary) hypertension: Secondary | ICD-10-CM | POA: Diagnosis not present

## 2019-09-09 DIAGNOSIS — E7849 Other hyperlipidemia: Secondary | ICD-10-CM | POA: Diagnosis not present

## 2019-09-09 DIAGNOSIS — M1991 Primary osteoarthritis, unspecified site: Secondary | ICD-10-CM | POA: Diagnosis not present

## 2019-09-11 ENCOUNTER — Inpatient Hospital Stay (HOSPITAL_COMMUNITY): Payer: Medicare Other | Attending: Hematology | Admitting: Nurse Practitioner

## 2019-09-11 ENCOUNTER — Other Ambulatory Visit: Payer: Self-pay

## 2019-09-11 DIAGNOSIS — Z87891 Personal history of nicotine dependence: Secondary | ICD-10-CM | POA: Diagnosis not present

## 2019-09-11 DIAGNOSIS — Z79899 Other long term (current) drug therapy: Secondary | ICD-10-CM | POA: Diagnosis not present

## 2019-09-11 DIAGNOSIS — D500 Iron deficiency anemia secondary to blood loss (chronic): Secondary | ICD-10-CM | POA: Diagnosis not present

## 2019-09-11 DIAGNOSIS — E039 Hypothyroidism, unspecified: Secondary | ICD-10-CM | POA: Diagnosis not present

## 2019-09-11 DIAGNOSIS — D509 Iron deficiency anemia, unspecified: Secondary | ICD-10-CM | POA: Insufficient documentation

## 2019-09-11 DIAGNOSIS — M069 Rheumatoid arthritis, unspecified: Secondary | ICD-10-CM | POA: Diagnosis not present

## 2019-09-11 NOTE — Assessment & Plan Note (Signed)
1.  Severe iron deficiency anemia: -Hemoglobin has consistently been low around 10 since October 2020. -Colonoscopy within the last 10 years by Dr. Laural Golden, apparently normal. -Patient denies any bleeding per rectum or melena.  No prior history of blood transfusion. -Work-up labs included B12, folate, copper, and multiple myeloma screening all normal.  Positive stool cards in 05/29/2019. -She cannot tolerate oral iron because of gastric symptoms. -Patient was set up for 2 PM infusions and had a reaction to the second Feraheme.  She was switched to [facility]. -Repeat labs done on 09/05/2019 showed hemoglobin 12.5, ferritin 54, percent saturation 17 -Patient does not want to return so frequently.  She reports her rheumatologist also checks her labs and she will come back if her hemoglobin drops. -She is requesting a December appointment with labs.  2.  Rheumatoid arthritis: -She is on Enbrel weekly in the last 2 years along with Plaquenil twice daily. -Arthritis is fairly well controlled on the current regimen.  Predominantly involves hands.

## 2019-09-11 NOTE — Progress Notes (Signed)
[facility] [place], [place] [address]   CLINIC:  Medical Oncology/Hematology  PCP:  Sharilyn Sites, [place] [address] [phone]   REASON FOR VISIT: Follow-up for severe iron deficiency anemia   CURRENT THERAPY: Intermittent iron infusions   INTERVAL HISTORY:  Ms. Erin Schmidt 86 y.o. female returns for routine follow-up for severe iron deficiency anemia.  Patient reports she is doing well since her last visit.  She reports the iron infusions do not give her much energy.  She denies any bright red bleeding per rectum or melena. Denies any nausea, vomiting, or diarrhea. Denies any new pains. Had not noticed any recent bleeding such as epistaxis, hematuria or hematochezia. Denies recent chest pain on exertion, shortness of breath on minimal exertion, pre-syncopal episodes, or palpitations. Denies any numbness or tingling in hands or feet. Denies any recent fevers, infections, or recent hospitalizations. Patient reports appetite at 100% and energy level at 100%.  She is eating well maintain her weight at this time.      REVIEW OF SYSTEMS:  Review of Systems  Neurological: Positive for dizziness.  All other systems reviewed and are negative.    PAST MEDICAL/SURGICAL HISTORY:  Past Medical History:  Diagnosis Date  . Arthritis   . GERD (gastroesophageal reflux disease)   . Hyperlipemia   . Hypertension    preventative measures  . Hypothyroidism   . Iron deficiency anemia 05/07/2019  . Migraine   . Thyroid disease   . Vertigo    Past Surgical History:  Procedure Laterality Date  . APPENDECTOMY    . BREAST BIOPSY Right    benign  . BREAST EXCISIONAL BIOPSY Left    Benign  . BREAST EXCISIONAL BIOPSY Left    Benign  . BREAST LUMPECTOMY  30 yrs ago   Dr. Milbert Coulter- rt breast  . BREAST LUMPECTOMY  15 yrs ago   left- Dr. Romona Curls  . BREAST LUMPECTOMY  01/2011  . CHOLECYSTECTOMY    . COLONOSCOPY    . COLONOSCOPY  04/13/2011     Procedure: COLONOSCOPY;  Surgeon: Rogene Houston, MD;  [facility];  Service: Endoscopy;  Laterality: N/A;  930  . MASTECTOMY, PARTIAL  01/13/2011   Procedure: MASTECTOMY PARTIAL;  Surgeon: Adin Hector, MD;  Location: [facility];  Service: General;  Laterality: Left;  left partial mastectomy with needle locallization  . VAGINAL HYSTERECTOMY     2 partials     SOCIAL HISTORY:  Social History   Socioeconomic History  . Marital status: Married    Spouse name: Not on file  . Number of children: 2  . Years of education: Not on file  . Highest education level: Not on file  Occupational History  . Occupation: RETIRED  Tobacco Use  . Smoking status: Former Smoker    Packs/day: 1.00    Years: 20.00    Pack years: 20.00    Types: Cigarettes    Quit date: 01/05/1989    Years since quitting: 30.7  . Smokeless tobacco: Never Used  Vaping Use  . Vaping Use: Never used  Substance and Sexual Activity  . Alcohol use: No  . Drug use: Never  . Sexual activity: Not on file  Other Topics Concern  . Not on file  Social History Narrative  . Not on file   Social Determinants of Health   Financial Resource Strain:   . Difficulty of Paying Living Expenses: Not on file  Food Insecurity:   . Worried About Charity fundraiser in the Last Year: Not on file  . Ran Out of Food in the Last Year: Not on file  Transportation Needs:   . Lack of Transportation (Medical): Not on file  . Lack of Transportation (Non-Medical): Not on file  Physical Activity:   . Days of Exercise per Week: Not on file  . Minutes of Exercise per Session: Not on file  Stress:   . Feeling of Stress : Not on file  Social Connections:   . Frequency of Communication with Friends and Family: Not on file  . Frequency of Social Gatherings with Friends and Family: Not on file  . Attends Religious Services: Not on file  . Active Member of Clubs or Organizations: Not on file  . Attends Theatre manager Meetings: Not on file  . Marital Status: Not on file  Intimate Partner Violence:   . Fear of Current or Ex-Partner: Not on file  . Emotionally Abused: Not on file  . Physically Abused: Not on file  . Sexually Abused: Not on file    FAMILY HISTORY:  Family History  Problem Relation Age of Onset  . Heart disease Mother   . Stroke Sister   . Heart disease Brother   . Atrial fibrillation Sister   . Coronary artery disease Brother   . Lung cancer Son   . Healthy Son   . Colon cancer Neg Hx     CURRENT MEDICATIONS:  Outpatient Encounter Medications as of 09/11/2019  Medication Sig  . benazepril-hydrochlorthiazide (LOTENSIN HCT) 20-12.5 MG per tablet Take 0.5 tablets by mouth daily.   . Calcium Carbonate-Vitamin D (CALCIUM + D PO) Take 1 tablet by mouth 2 (two) times daily.   . cetirizine (ZYRTEC) 10 MG tablet Take 10 mg by mouth daily.  Marland Kitchen escitalopram (LEXAPRO) 10 MG tablet Take 10 mg by mouth daily.  Marland Kitchen etanercept (ENBREL SURECLICK) 50 MG/ML injection Inject 0.98 mLs (50 mg total) into the skin once a week.  . hydroxychloroquine (PLAQUENIL) 200 MG tablet TAKE 1 TABLET BY MOUTH TWICE A DAY  . levothyroxine (SYNTHROID, LEVOTHROID) 112 MCG tablet Take 112 mcg by mouth daily before breakfast.   . meclizine (ANTIVERT) 25 MG tablet Take 25 mg by mouth 3 (three) times daily as needed for dizziness.   . traMADol-acetaminophen (ULTRACET) 37.5-325 MG tablet Take 1-2 tablets by mouth every 8 (eight) hours as needed.    No facility-administered encounter medications on file as of 09/11/2019.    ALLERGIES:  Allergies  Allergen Reactions  . Feraheme [Ferumoxytol]     Reacted during infusion requiring medical intervention     PHYSICAL EXAM:  ECOG Performance status: 1  Vitals:   09/11/19 1454  BP: (!) 148/63  Pulse: 67  Resp: 18  Temp: (!) 97.5 F (36.4 C)  SpO2: 98%   Filed Weights   09/11/19 1454  Weight: 150 lb 12.8 oz (68.4 kg)   Physical Exam Constitutional:        Appearance: Normal appearance. She is normal weight.  Cardiovascular:     Rate and Rhythm: Normal rate and regular rhythm.     Heart sounds: Normal heart sounds.  Pulmonary:     Effort: Pulmonary effort is normal.     Breath sounds: Normal breath sounds.  Abdominal:     General: Bowel sounds are normal.     Palpations: Abdomen is soft.  Musculoskeletal:        General: Normal  range of motion.  Skin:    General: Skin is warm.  Neurological:     Mental Status: She is alert and oriented to person, place, and time. Mental status is at baseline.  Psychiatric:        Mood and Affect: Mood normal.        Behavior: Behavior normal.        Thought Content: Thought content normal.        Judgment: Judgment normal.      LABORATORY DATA:  I have reviewed the labs as listed.  CBC    Component Value Date/Time   WBC 4.8 09/05/2019 1115   RBC 4.34 09/05/2019 1115   RBC 4.34 09/05/2019 1115   HGB 12.5 09/05/2019 1115   HCT 39.4 09/05/2019 1115   PLT 186 09/05/2019 1115   MCV 90.8 09/05/2019 1115   MCH 28.8 09/05/2019 1115   MCHC 31.7 09/05/2019 1115   RDW 17.9 (H) 09/05/2019 1115   LYMPHSABS 1.6 09/05/2019 1115   MONOABS 0.4 09/05/2019 1115   EOSABS 0.2 09/05/2019 1115   BASOSABS 0.0 09/05/2019 1115   CMP Latest Ref Rng & Units 09/05/2019 07/28/2019 04/23/2019  Glucose 70 - 99 mg/dL 89 82 87  BUN 8 - 23 mg/dL _0 Creatinine 0.44 - 1.00 mg/dL 1.07(H) 1.03(H) 1.00(H)  Sodium 135 - 145 mmol/L 137 140 141  Potassium 3.5 - 5.1 mmol/L 3.9 4.0 4.3  Chloride 98 - 111 mmol/L 102 103 106  CO2 22 - 32 mmol/L _1 Calcium 8.9 - 10.3 mg/dL 9.0 8.9 8.8  Total Protein 6.5 - 8.1 g/dL 6.7 7.0 6.7  Total Bilirubin 0.3 - 1.2 mg/dL 0.7 0.4 0.4  Alkaline Phos 38 - 126 U/L 60 - -  AST 15 - 41 U/L _2 ALT 0 - 44 U/L _3 All questions were answered to patient's stated satisfaction. Encouraged patient to call with any new concerns or questions before his next visit to  the cancer center and we can certain see him sooner, if needed.     ASSESSMENT & PLAN:  Iron deficiency anemia 1.  Severe iron deficiency anemia: -Hemoglobin has consistently been low around 10 since October 2020. -Colonoscopy within the last 10 years by Dr. Laural Golden, apparently normal. -Patient denies any bleeding per rectum or melena.  No prior history of blood transfusion. -Work-up labs included B12, folate, copper, and multiple myeloma screening all normal.  Positive stool cards in 05/29/2019. -She cannot tolerate oral iron because of gastric symptoms. -Patient was set up for 2 PM infusions and had a reaction to the second Feraheme.  She was switched to [facility]. -Repeat labs done on 09/05/2019 showed hemoglobin 12.5, ferritin 54, percent saturation 17 -Patient does not want to return so frequently.  She reports her rheumatologist also checks her labs and she will come back if her hemoglobin drops. -She is requesting a December appointment with labs.  2.  Rheumatoid arthritis: -She is on Enbrel weekly in the last 2 years along with Plaquenil twice daily. -Arthritis is fairly well controlled on the current regimen.  Predominantly involves hands.     Orders placed this encounter:  Orders Placed This Encounter  Procedures  . CBC with Differential/Platelet  . Comprehensive metabolic panel  . Ferritin  . Iron and TIBC  . Lactate dehydrogenase  . Vitamin B12  . VITAMIN D 25 Hydroxy (Vit-D Deficiency, Fractures)      Francene Finders, FNP-C Deneise Lever  [place] [phone]

## 2019-09-18 ENCOUNTER — Telehealth: Payer: Self-pay | Admitting: Rheumatology

## 2019-09-18 DIAGNOSIS — R7301 Impaired fasting glucose: Secondary | ICD-10-CM | POA: Diagnosis not present

## 2019-09-18 DIAGNOSIS — E039 Hypothyroidism, unspecified: Secondary | ICD-10-CM | POA: Diagnosis not present

## 2019-09-18 DIAGNOSIS — D649 Anemia, unspecified: Secondary | ICD-10-CM

## 2019-09-18 DIAGNOSIS — E049 Nontoxic goiter, unspecified: Secondary | ICD-10-CM | POA: Diagnosis not present

## 2019-09-18 DIAGNOSIS — I10 Essential (primary) hypertension: Secondary | ICD-10-CM | POA: Diagnosis not present

## 2019-09-18 NOTE — Telephone Encounter (Signed)
Patient called requesting a return call stating at her last appointment she received paperwork with vaccine recommendations and what to do for each medication.  Patient states she takes Enbrel which wasn't listed on the paperwork.  Patient states she is also confused when labwork is due.  Patient states on her after visit summary it states labwork due in October, but she had labwork in August with Dr. Raliegh Ip.

## 2019-09-18 NOTE — Telephone Encounter (Signed)
Okay to place future orders for ferritin, iron, TIBC, LDH, vitamin B12, and vitamin D.

## 2019-09-18 NOTE — Telephone Encounter (Signed)
Future orders have been placed. Patient is aware they are placed and will be drawn with labs in November.

## 2019-09-18 NOTE — Telephone Encounter (Signed)
Patient advised the Enbrel was not listed in information given about the Covid vaccine as there are no changes that need to be made with the dosing when it comes to the vaccination. Patient verbalized understanding. Patient states she has to see [place] and hematology every 3 months for lab work. Patient would like to know if we can draw the additional labs she needs with her standing CBC and CMP here. Hematology has her getting Ferritin, Iron and TIBC, Lactate dehydrogenase, Vitamin B 12 and Vitamin D.

## 2019-09-21 ENCOUNTER — Other Ambulatory Visit: Payer: Self-pay | Admitting: Physician Assistant

## 2019-09-21 DIAGNOSIS — M0609 Rheumatoid arthritis without rheumatoid factor, multiple sites: Secondary | ICD-10-CM

## 2019-09-22 NOTE — Telephone Encounter (Signed)
Last Visit: 09/04/2019 Next Visit: 02/05/2020 Labs: 09/05/2019 Creat. 1.07, GFR 47, RDW 17.9 Eye exam: 09/02/2019 WNL    Current Dose per office note 09/04/2019: Plaquenil 200 mg 1 tablet twice daily.PLQ Eye Exam DX: Rheumatoid arthritis of multiple sites without rheumatoid factor   Okay to refill Plaquenil?

## 2019-09-25 ENCOUNTER — Other Ambulatory Visit: Payer: Self-pay

## 2019-09-25 ENCOUNTER — Emergency Department (HOSPITAL_COMMUNITY)
Admission: EM | Admit: 2019-09-25 | Discharge: 2019-09-25 | Disposition: A | Discharge status: Home / Self Care | Payer: Medicare Other | Attending: Emergency Medicine | Admitting: Emergency Medicine

## 2019-09-25 ENCOUNTER — Emergency Department (HOSPITAL_COMMUNITY): Payer: Medicare Other

## 2019-09-25 ENCOUNTER — Encounter (HOSPITAL_COMMUNITY): Payer: Self-pay | Admitting: *Deleted

## 2019-09-25 DIAGNOSIS — I4891 Unspecified atrial fibrillation: Secondary | ICD-10-CM | POA: Diagnosis not present

## 2019-09-25 DIAGNOSIS — E039 Hypothyroidism, unspecified: Secondary | ICD-10-CM | POA: Diagnosis not present

## 2019-09-25 DIAGNOSIS — R7301 Impaired fasting glucose: Secondary | ICD-10-CM | POA: Diagnosis not present

## 2019-09-25 DIAGNOSIS — Z79899 Other long term (current) drug therapy: Secondary | ICD-10-CM | POA: Diagnosis not present

## 2019-09-25 DIAGNOSIS — Z23 Encounter for immunization: Secondary | ICD-10-CM | POA: Diagnosis not present

## 2019-09-25 DIAGNOSIS — Z7901 Long term (current) use of anticoagulants: Secondary | ICD-10-CM | POA: Diagnosis not present

## 2019-09-25 DIAGNOSIS — Z79890 Hormone replacement therapy (postmenopausal): Secondary | ICD-10-CM | POA: Diagnosis not present

## 2019-09-25 DIAGNOSIS — R002 Palpitations: Secondary | ICD-10-CM | POA: Diagnosis not present

## 2019-09-25 DIAGNOSIS — I10 Essential (primary) hypertension: Secondary | ICD-10-CM | POA: Insufficient documentation

## 2019-09-25 DIAGNOSIS — E049 Nontoxic goiter, unspecified: Secondary | ICD-10-CM | POA: Diagnosis not present

## 2019-09-25 LAB — BASIC METABOLIC PANEL
Anion gap: 9 (ref 5–15)
BUN: 23 mg/dL (ref 8–23)
CO2: 26 mmol/L (ref 22–32)
Calcium: 8.9 mg/dL (ref 8.9–10.3)
Chloride: 103 mmol/L (ref 98–111)
Creatinine, Ser: 1.05 mg/dL — ABNORMAL HIGH (ref 0.44–1.00)
GFR calc Af Amer: 56 mL/min — ABNORMAL LOW (ref >60)
GFR calc non Af Amer: 48 mL/min — ABNORMAL LOW (ref >60)
Glucose, Bld: 92 mg/dL (ref 70–99)
Potassium: 3.9 mmol/L (ref 3.5–5.1)
Sodium: 138 mmol/L (ref 135–145)

## 2019-09-25 LAB — CBC WITH DIFFERENTIAL/PLATELET
Abs Immature Granulocytes: 0.01 10*3/uL (ref 0.00–0.07)
Basophils Absolute: 0 10*3/uL (ref 0.0–0.1)
Basophils Relative: 1 %
Eosinophils Absolute: 0.2 10*3/uL (ref 0.0–0.5)
Eosinophils Relative: 4 %
HCT: 41.6 % (ref 36.0–46.0)
Hemoglobin: 13.4 g/dL (ref 12.0–15.0)
Immature Granulocytes: 0 %
Lymphocytes Relative: 40 %
Lymphs Abs: 2.3 10*3/uL (ref 0.7–4.0)
MCH: 29.6 pg (ref 26.0–34.0)
MCHC: 32.2 g/dL (ref 30.0–36.0)
MCV: 92 fL (ref 80.0–100.0)
Monocytes Absolute: 0.6 10*3/uL (ref 0.1–1.0)
Monocytes Relative: 10 %
Neutro Abs: 2.6 10*3/uL (ref 1.7–7.7)
Neutrophils Relative %: 45 %
Platelets: 184 10*3/uL (ref 150–400)
RBC: 4.52 MIL/uL (ref 3.87–5.11)
RDW: 15 % (ref 11.5–15.5)
WBC: 5.7 10*3/uL (ref 4.0–10.5)
nRBC: 0 % (ref 0.0–0.2)

## 2019-09-25 LAB — TSH: TSH: 2.011 u[IU]/mL (ref 0.350–4.500)

## 2019-09-25 LAB — MAGNESIUM: Magnesium: 2.1 mg/dL (ref 1.7–2.4)

## 2019-09-25 MED ORDER — METOPROLOL TARTRATE 5 MG/5ML IV SOLN
2.5000 mg | Freq: Once | INTRAVENOUS | Status: AC
  Administered 2019-09-25: 2.5 mg via INTRAVENOUS
  Filled 2019-09-25: qty 5

## 2019-09-25 MED ORDER — ASPIRIN 81 MG PO CHEW
324.0000 mg | CHEWABLE_TABLET | Freq: Once | ORAL | Status: AC
  Administered 2019-09-25: 324 mg via ORAL
  Filled 2019-09-25: qty 4

## 2019-09-25 MED ORDER — APIXABAN (ELIQUIS) VTE STARTER PACK (10MG AND 5MG): ORAL_TABLET | ORAL | 0 refills | Status: DC

## 2019-09-25 MED ORDER — DILTIAZEM HCL 30 MG PO TABS
60.0000 mg | ORAL_TABLET | Freq: Once | ORAL | Status: AC
  Administered 2019-09-25: 60 mg via ORAL
  Filled 2019-09-25: qty 2

## 2019-09-25 MED ORDER — DILTIAZEM HCL 60 MG PO TABS: 60.0000 mg | ORAL_TABLET | Freq: Two times a day (BID) | ORAL | 1 refills | Status: DC

## 2019-09-25 MED ORDER — DILTIAZEM HCL 30 MG PO TABS: 30.0000 mg | ORAL_TABLET | Freq: Two times a day (BID) | ORAL | 1 refills | Status: DC

## 2019-09-25 MED ORDER — APIXABAN 5 MG PO TABS
5.0000 mg | ORAL_TABLET | Freq: Two times a day (BID) | ORAL | Status: DC
  Administered 2019-09-25: 5 mg via ORAL
  Filled 2019-09-25: qty 1

## 2019-09-25 NOTE — Progress Notes (Addendum)
Discussed patient with ER staff, sent to ER from clinic with asymptomatic new diagnosis of afib, mild RVR in 120s. Starting oral diltiazem, monitor response with 60mg , if tolerates can d/c on short acting oral dilt 60mg  bid. History of iron deficient anemia, from heme note note has not had active bleeding and Hgb has been stable around 10, cannot tolerate oral iron so on IV. Elevated CHADS2Vasc score, recommend eliquis 5mg  bid, will need repeat labs with cbc in next few weeks. [place] for ER discharge, we will arrange outpatient appt   Zandra Abts MD
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
## 2019-09-25 NOTE — ED Triage Notes (Signed)
Pt new dx with Afib at [facility] today.  Pt does not feel any symptoms of this.

## 2019-09-25 NOTE — Discharge Instructions (Addendum)
You have been diagnosed with what appears to be atrial fibrillation.  This means you have an irregular heart rate.  Please take the following medications to help  1.  Eliquis, take this twice a day for the next 30 days 2.  Diltiazem, 60 mg twice a day.  If this makes her blood pressure too low, less than 110 or if you are symptomatic with lightheadedness shortness of breath or weakness then stop taking that medication and change to the 30 mg dose twice a day. 3.  Return to the emergency department immediately for severe or worsening symptoms.   Information on my medicine - ELIQUIS (apixaban)  This medication education was reviewed with me or my healthcare representative as part of my discharge preparation.    Why was Eliquis prescribed for you? Eliquis was prescribed for you to reduce the risk of a blood clot forming that can cause a stroke if you have a medical condition called atrial fibrillation (a type of irregular heartbeat).  What do You need to know about Eliquis ? Take your Eliquis TWICE DAILY - one tablet in the morning and one tablet in the evening with or without food. If you have difficulty swallowing the tablet whole please discuss with your pharmacist how to take the medication safely.  Take Eliquis exactly as prescribed by your doctor and DO NOT stop taking Eliquis without talking to the doctor who prescribed the medication.  Stopping may increase your risk of developing a stroke.  Refill your prescription before you run out.  After discharge, you should have regular check-up appointments with your healthcare provider that is prescribing your Eliquis.  In the future your dose may need to be changed if your kidney function or weight changes by a significant amount or as you get older.  What do you do if you miss a dose? If you miss a dose, take it as soon as you remember on the same day and resume taking twice daily.  Do not take more than one dose of ELIQUIS at the same  time to make up a missed dose.  Important Safety Information A possible side effect of Eliquis is bleeding. You should call your healthcare provider right away if you experience any of the following: Bleeding from an injury or your nose that does not stop. Unusual colored urine (red or dark brown) or unusual colored stools (red or black). Unusual bruising for unknown reasons. A serious fall or if you hit your head (even if there is no bleeding).  Some medicines may interact with Eliquis and might increase your risk of bleeding or clotting while on Eliquis. To help avoid this, consult your healthcare provider or pharmacist prior to using any new prescription or non-prescription medications, including herbals, vitamins, non-steroidal anti-inflammatory drugs (NSAIDs) and supplements.  This website has more information on Eliquis (apixaban): http://www.eliquis.com/eliquis/home

## 2019-09-26 NOTE — Progress Notes (Signed)
Follow up note on Eliquis for afib:  A/P: Patient presented to ED with new onset asymptomatic afib. A new prescription was sent to pharmacy for eliquis VTE starter pack. Clarified dose with Dr. Wilson Singer and contacted patient to clarify dose for patient based on 86 yo 68kg Scr 1, dose should be 5mg  po bid. In addition, patient will come by and pick up a eliquis voucher today. Prescription will be amended at [facility] in [place] for 5mg  po bid. [name] will follow up with Dr. Nelly Laurence office  Thanks for the opportunity to participate in the care of this patient,  Erin Schmidt, [place], [place] Pharmacist Pager [phone]

## 2019-09-29 ENCOUNTER — Ambulatory Visit: Payer: Self-pay | Admitting: Cardiology

## 2019-10-09 DIAGNOSIS — E039 Hypothyroidism, unspecified: Secondary | ICD-10-CM | POA: Diagnosis not present

## 2019-10-09 DIAGNOSIS — M069 Rheumatoid arthritis, unspecified: Secondary | ICD-10-CM | POA: Diagnosis not present

## 2019-10-09 DIAGNOSIS — M1991 Primary osteoarthritis, unspecified site: Secondary | ICD-10-CM | POA: Diagnosis not present

## 2019-10-10 ENCOUNTER — Encounter: Payer: Self-pay | Admitting: Cardiology

## 2019-10-10 ENCOUNTER — Ambulatory Visit (INDEPENDENT_AMBULATORY_CARE_PROVIDER_SITE_OTHER): Payer: Medicare Other | Admitting: Cardiology

## 2019-10-10 VITALS — BP 112/78 | HR 95 | Ht 66.0 in | Wt 138.0 lb

## 2019-10-10 DIAGNOSIS — I4891 Unspecified atrial fibrillation: Secondary | ICD-10-CM | POA: Diagnosis not present

## 2019-10-10 MED ORDER — DILTIAZEM HCL 60 MG PO TABS: 60.0000 mg | ORAL_TABLET | Freq: Three times a day (TID) | ORAL | 1 refills | Status: DC

## 2019-10-10 NOTE — Progress Notes (Signed)
Clinical Summary Ms. Prindle is a 86 y.o.female seen as new patient for the following medical problems.  1. Afib - ER visit 09/2019, sent from outside clinic for new diagnosis of afib - was asymptomatic, unknown duration of afib - started on eliquis 5mg  bid. Discharged on oral diltiazem   - taking dilt 60mg  bid at home. SBPs 120-130s, HRs 100s-130s. Remains asymptomatic.     Past Medical History:  Diagnosis Date  . Arthritis   . GERD (gastroesophageal reflux disease)   . Hyperlipemia   . Hypertension    preventative measures  . Hypothyroidism   . Iron deficiency anemia 05/07/2019  . Migraine   . Thyroid disease   . Vertigo      Allergies  Allergen Reactions  . Feraheme [Ferumoxytol]     Reacted during infusion requiring medical intervention. Increased BP     Current Outpatient Medications  Medication Sig Dispense Refill  . APIXABAN (ELIQUIS) VTE STARTER PACK (10MG  AND 5MG ) Take as directed on package: start with two-5mg  tablets twice daily for 7 days. On day 8, switch to one-5mg  tablet twice daily. 1 each 0  . benazepril-hydrochlorthiazide (LOTENSIN HCT) 20-12.5 MG per tablet Take 0.5 tablets by mouth daily.     . Calcium Carbonate-Vitamin D (CALCIUM + D PO) Take 1 tablet by mouth 2 (two) times daily.     . cetirizine (ZYRTEC) 10 MG tablet Take 10 mg by mouth daily.    Marland Kitchen diltiazem (CARDIZEM) 30 MG tablet Take 1 tablet (30 mg total) by mouth 2 (two) times daily. 60 tablet 1  . diltiazem (CARDIZEM) 60 MG tablet Take 1 tablet (60 mg total) by mouth 2 (two) times daily. 60 tablet 1  . escitalopram (LEXAPRO) 10 MG tablet Take 10 mg by mouth daily.    Marland Kitchen etanercept (ENBREL SURECLICK) 50 MG/ML injection Inject 0.98 mLs (50 mg total) into the skin once a week. 12 mL 0  . hydroxychloroquine (PLAQUENIL) 200 MG tablet TAKE 1 TABLET BY MOUTH TWICE A DAY 180 tablet 0  . levothyroxine (SYNTHROID, LEVOTHROID) 112 MCG tablet Take 112 mcg by mouth daily before breakfast.     .  meclizine (ANTIVERT) 25 MG tablet Take 25 mg by mouth 3 (three) times daily as needed for dizziness.     . traMADol-acetaminophen (ULTRACET) 37.5-325 MG tablet Take 1-2 tablets by mouth every 8 (eight) hours as needed.      No current facility-administered medications for this visit.     Past Surgical History:  Procedure Laterality Date  . APPENDECTOMY    . BREAST BIOPSY Right    benign  . BREAST EXCISIONAL BIOPSY Left    Benign  . BREAST EXCISIONAL BIOPSY Left    Benign  . BREAST LUMPECTOMY  30 yrs ago   Dr. Milbert Coulter- rt breast  . BREAST LUMPECTOMY  15 yrs ago   left- Dr. Romona Curls  . BREAST LUMPECTOMY  01/2011  . CHOLECYSTECTOMY    . COLONOSCOPY    . COLONOSCOPY  04/13/2011   Procedure: COLONOSCOPY;  Surgeon: Rogene Houston, MD;  [facility];  Service: Endoscopy;  Laterality: N/A;  930  . MASTECTOMY, PARTIAL  01/13/2011   Procedure: MASTECTOMY PARTIAL;  Surgeon: Adin Hector, MD;  Location: [facility];  Service: General;  Laterality: Left;  left partial mastectomy with needle locallization  . VAGINAL HYSTERECTOMY     2 partials     Allergies  Allergen Reactions  . Feraheme [Ferumoxytol]  Reacted during infusion requiring medical intervention. Increased BP      Family History  Problem Relation Age of Onset  . Heart disease Mother   . Stroke Sister   . Heart disease Brother   . Atrial fibrillation Sister   . Coronary artery disease Brother   . Lung cancer Son   . Healthy Son   . Colon cancer Neg Hx      Social History Ms. Heisler reports that she quit smoking about 30 years ago. Her smoking use included cigarettes. She has a 20.00 pack-year smoking history. She has never used smokeless tobacco. Ms. Blakeney reports no history of alcohol use.   Review of Systems CONSTITUTIONAL: No weight loss, fever, chills, weakness or fatigue.  HEENT: Eyes: No visual loss, blurred vision, double vision or yellow sclerae.No hearing loss, sneezing,  congestion, runny nose or sore throat.  SKIN: No rash or itching.  CARDIOVASCULAR: per hpi RESPIRATORY: No shortness of breath, cough or sputum.  GASTROINTESTINAL: No anorexia, nausea, vomiting or diarrhea. No abdominal pain or blood.  GENITOURINARY: No burning on urination, no polyuria NEUROLOGICAL: No headache, dizziness, syncope, paralysis, ataxia, numbness or tingling in the extremities. No change in bowel or bladder control.  MUSCULOSKELETAL: No muscle, back pain, joint pain or stiffness.  LYMPHATICS: No enlarged nodes. No history of splenectomy.  PSYCHIATRIC: No history of depression or anxiety.  ENDOCRINOLOGIC: No reports of sweating, cold or heat intolerance. No polyuria or polydipsia.  Marland Kitchen   Physical Examination Today's Vitals   10/10/19 0930  BP: 112/78  Pulse: 95  SpO2: 98%  Weight: 138 lb (62.6 kg)  Height: 5\' 6"  (1.676 m)   Body mass index is 22.27 kg/m.  Gen: resting comfortably, no acute distress HEENT: no scleral icterus, pupils equal round and reactive, no palptable cervical adenopathy,  CV: irreg, no m/r/g, no jvd Resp: Clear to auscultation bilaterally GI: abdomen is soft, non-tender, non-distended, normal bowel sounds, no hepatosplenomegaly MSK: extremities are warm, no edema.  Skin: warm, no rash Neuro:  no focal deficits Psych: appropriate affect     Assessment and Plan  1. Afib - new diagnosis, has been asymptomatic - EKG shows afib today, elevated rate - rates remain elevated, will increase dilt to 60mg  tid. Transition to long acting dilt once stable on dosing - plan for DCCV after Oct 7, which will mark 3 weeks of anticoag. She is to call us and udpate [place] on her avaialbility       Arnoldo Lenis, M.D.

## 2019-10-10 NOTE — Patient Instructions (Signed)
Your physician recommends that you schedule a follow-up appointment in: [place] has recommended you make the following change in your medication:   [place] physician has requested that you have an echocardiogram. Echocardiography is a painless test that uses sound waves to create images of your heart. It provides your doctor with information about the size and shape of your heart and how well your heart's chambers and valves are working. This procedure takes approximately one hour. There are no restrictions for this procedure.  CALL us AFTER 10/16/19 TO SCHEDULED CARDIOVERSION   Thank you for choosing [facility]!!

## 2019-10-13 DIAGNOSIS — M1991 Primary osteoarthritis, unspecified site: Secondary | ICD-10-CM | POA: Diagnosis not present

## 2019-10-13 DIAGNOSIS — E063 Autoimmune thyroiditis: Secondary | ICD-10-CM | POA: Diagnosis not present

## 2019-10-13 DIAGNOSIS — I4891 Unspecified atrial fibrillation: Secondary | ICD-10-CM | POA: Diagnosis not present

## 2019-10-13 DIAGNOSIS — Z681 Body mass index (BMI) 19 or less, adult: Secondary | ICD-10-CM | POA: Diagnosis not present

## 2019-10-13 DIAGNOSIS — I10 Essential (primary) hypertension: Secondary | ICD-10-CM | POA: Diagnosis not present

## 2019-10-13 DIAGNOSIS — J329 Chronic sinusitis, unspecified: Secondary | ICD-10-CM | POA: Diagnosis not present

## 2019-10-16 ENCOUNTER — Ambulatory Visit (HOSPITAL_COMMUNITY)
Admission: RE | Admit: 2019-10-16 | Discharge: 2019-10-16 | Disposition: A | Discharge status: Home / Self Care | Payer: Medicare Other | Source: Ambulatory Visit | Attending: Cardiology | Admitting: Cardiology

## 2019-10-16 ENCOUNTER — Other Ambulatory Visit: Payer: Self-pay

## 2019-10-16 DIAGNOSIS — I4891 Unspecified atrial fibrillation: Secondary | ICD-10-CM

## 2019-10-16 LAB — ECHOCARDIOGRAM COMPLETE
AR max vel: 1.53 cm2
AV Area VTI: 1.6 cm2
AV Area mean vel: 1.46 cm2
AV Mean grad: 5.7 mmHg
AV Peak grad: 10.6 mmHg
Ao pk vel: 1.63 m/s
Area-P 1/2: 3.65 cm2
MV M vel: 5.22 m/s
MV Peak grad: 108.8 mmHg
Radius: 0.5 cm
S' Lateral: 3.67 cm

## 2019-10-16 NOTE — Progress Notes (Signed)
*  PRELIMINARY RESULTS* Echocardiogram 2D Echocardiogram has been performed.  Erin Schmidt 10/16/2019, 11:22 AM

## 2019-10-22 ENCOUNTER — Telehealth: Payer: Self-pay | Admitting: Cardiology

## 2019-10-22 DIAGNOSIS — I4891 Unspecified atrial fibrillation: Secondary | ICD-10-CM

## 2019-10-22 NOTE — Telephone Encounter (Signed)
Pt voiced understanding - will call us back tomorrow with dates to schedule cardioversion - says she hasn't missed any doses of Eliquis    Echo shows normal heart pumping function. She has a heart valve that is moderately leaky (moderate mitral regurgitation) but this is not considered significant and just something to monitor at this time   Zandra Abts MD

## 2019-10-22 NOTE — Telephone Encounter (Signed)
Patient is requesting Echo results.

## 2019-10-22 NOTE — Telephone Encounter (Signed)
Just resulted   J Carolyna Yerian MD 

## 2019-10-24 NOTE — Telephone Encounter (Signed)
Patient called in regards with questions to a Cardioversion that needs to be scheduled.  Needs to request a refill on Eliquis - [facility]  [place]

## 2019-10-28 MED ORDER — APIXABAN 5 MG PO TABS
5.0000 mg | ORAL_TABLET | Freq: Two times a day (BID) | ORAL | 3 refills | Status: DC
Start: 2019-10-28 — End: 2020-02-03

## 2019-11-03 ENCOUNTER — Other Ambulatory Visit: Payer: Self-pay | Admitting: Cardiology

## 2019-11-03 NOTE — Telephone Encounter (Signed)
Thx, I have placed the orders   Zandra Abts MD

## 2019-11-03 NOTE — Addendum Note (Signed)
Addended by: Julian Hy T on: 11/03/2019 10:35 AM   Modules accepted: Orders

## 2019-11-03 NOTE — Telephone Encounter (Signed)
Pt scheduled for pre op 10/29 @ 11am and 11/1 for cardioversion - pt made aware

## 2019-11-05 NOTE — Patient Instructions (Addendum)
Erin Schmidt  11/05/2019     @PREFPERIOPPHARMACY @   Your procedure is scheduled on  11/10/2019.  Report to [facility] at  0700  A.M.  Call this number if you have problems the morning of surgery:  [phone]   Remember:  Do not eat or drink after midnight.         DO NOT miss any doses of your eliquis before your procedure.                        Take these medicines the morning of surgery with A SIP OF WATER  Diltiazem, zyrtec, lexapro, plaquenil, tramadol, levothyroxine, antivert(if needed).    Do not wear jewelry, make-up or nail polish.  Do not wear lotions, powders, or perfumes. Please wear deodorant and brush your teeth.  Do not shave 48 hours prior to surgery.  Men may shave face and neck.  Do not bring valuables to the hospital.  [facility] is not responsible for any belongings or valuables.  Contacts, dentures or bridgework may not be worn into surgery.  Leave your suitcase in the car.  After surgery it may be brought to your room.  For patients admitted to the hospital, discharge time will be determined by your treatment team.  Patients discharged the day of surgery will not be allowed to drive home.   Name and phone number of your driver:   family Special instructions:  DO NOT smoke the morning of your procedure.  Please read over the following fact sheets that you were given. Anesthesia Post-op Instructions and Care and Recovery After Surgery       Electrical Cardioversion Electrical cardioversion is the delivery of a jolt of electricity to restore a normal rhythm to the heart. A rhythm that is too fast or is not regular keeps the heart from pumping well. In this procedure, sticky patches or metal paddles are placed on the chest to deliver electricity to the heart from a device. This procedure may be done in an emergency if:  There is low or no blood pressure as a result of the heart rhythm.  Normal rhythm must be restored as fast as possible  to protect the brain and heart from further damage.  It may save a life. This may also be a scheduled procedure for irregular or fast heart rhythms that are not immediately life-threatening. Tell a health care provider about:  Any allergies you have.  All medicines you are taking, including vitamins, herbs, eye drops, creams, and over-the-counter medicines.  Any problems you or family members have had with anesthetic medicines.  Any blood disorders you have.  Any surgeries you have had.  Any medical conditions you have.  Whether you are pregnant or may be pregnant. What are the risks? Generally, this is a safe procedure. However, problems may occur, including:  Allergic reactions to medicines.  A blood clot that breaks free and travels to other parts of your body.  The possible return of an abnormal heart rhythm within hours or days after the procedure.  Your heart stopping (cardiac arrest). This is rare. What happens before the procedure? Medicines  Your health care provider may have you start taking: ? Blood-thinning medicines (anticoagulants) so your blood does not clot as easily. ? Medicines to help stabilize your heart rate and rhythm.  Ask your health care provider about: ? Changing or stopping your regular medicines. This is especially important if  you are taking diabetes medicines or blood thinners. ? Taking medicines such as aspirin and ibuprofen. These medicines can thin your blood. Do not take these medicines unless your health care provider tells you to take them. ? Taking over-the-counter medicines, vitamins, herbs, and supplements. General instructions  Follow instructions from your health care provider about eating or drinking restrictions.  Plan to have someone take you home from the hospital or clinic.  If you will be going home right after the procedure, plan to have someone with you for 24 hours.  Ask your health care provider what steps will be  taken to help prevent infection. These may include washing your skin with a germ-killing soap. What happens during the procedure?   An IV will be inserted into one of your veins.  Sticky patches (electrodes) or metal paddles may be placed on your chest.  You will be given a medicine to help you relax (sedative).  An electrical shock will be delivered. The procedure may vary among health care providers and hospitals. What can I expect after the procedure?  Your blood pressure, heart rate, breathing rate, and blood oxygen level will be monitored until you leave the hospital or clinic.  Your heart rhythm will be watched to make sure it does not change.  You may have some redness on the skin where the shocks were given. Follow these instructions at home:  Do not drive for 24 hours if you were given a sedative during your procedure.  Take over-the-counter and prescription medicines only as told by your health care provider.  Ask your health care provider how to check your pulse. Check it often.  Rest for 48 hours after the procedure or as told by your health care provider.  Avoid or limit your caffeine use as told by your health care provider.  Keep all follow-up visits as told by your health care provider. This is important. Contact a health care provider if:  You feel like your heart is beating too quickly or your pulse is not regular.  You have a serious muscle cramp that does not go away. Get help right away if:  You have discomfort in your chest.  You are dizzy or you feel faint.  You have trouble breathing or you are short of breath.  Your speech is slurred.  You have trouble moving an arm or leg on one side of your body.  Your fingers or toes turn cold or blue. Summary  Electrical cardioversion is the delivery of a jolt of electricity to restore a normal rhythm to the heart.  This procedure may be done right away in an emergency or may be a scheduled procedure  if the condition is not an emergency.  Generally, this is a safe procedure.  After the procedure, check your pulse often as told by your health care provider. This information is not intended to replace advice given to you by your health care provider. Make sure you discuss any questions you have with your health care provider. Document Revised: 07/29/2018 Document Reviewed: 07/29/2018 Elsevier Patient Education  [place] After These instructions provide you with information about caring for yourself after your procedure. Your health care provider may also give you more specific instructions. Your treatment has been planned according to current medical practices, but problems sometimes occur. Call your health care provider if you have any problems or questions after your procedure. What can I expect after the procedure? After your procedure,  you may:  Feel sleepy for several hours.  Feel clumsy and have poor balance for several hours.  Feel forgetful about what happened after the procedure.  Have poor judgment for several hours.  Feel nauseous or vomit.  Have a sore throat if you had a breathing tube during the procedure. Follow these instructions at home: For at least 24 hours after the procedure:      Have a responsible adult stay with you. It is important to have someone help care for you until you are awake and alert.  Rest as needed.  Do not: ? Participate in activities in which you could fall or become injured. ? Drive. ? Use heavy machinery. ? Drink alcohol. ? Take sleeping pills or medicines that cause drowsiness. ? Make important decisions or sign legal documents. ? Take care of children on your own. Eating and drinking  Follow the diet that is recommended by your health care provider.  If you vomit, drink water, juice, or soup when you can drink without vomiting.  Make sure you have little or no nausea before eating  solid foods. General instructions  Take over-the-counter and prescription medicines only as told by your health care provider.  If you have sleep apnea, surgery and certain medicines can increase your risk for breathing problems. Follow instructions from your health care provider about wearing your sleep device: ? Anytime you are sleeping, including during daytime naps. ? While taking prescription pain medicines, sleeping medicines, or medicines that make you drowsy.  If you smoke, do not smoke without supervision.  Keep all follow-up visits as told by your health care provider. This is important. Contact a health care provider if:  You keep feeling nauseous or you keep vomiting.  You feel light-headed.  You develop a rash.  You have a fever. Get help right away if:  You have trouble breathing. Summary  For several hours after your procedure, you may feel sleepy and have poor judgment.  Have a responsible adult stay with you for at least 24 hours or until you are awake and alert. This information is not intended to replace advice given to you by your health care provider. Make sure you discuss any questions you have with your health care provider. Document Revised: 03/26/2017 Document Reviewed: 04/18/2015 Elsevier Patient Education  2020 [employer]. -----------

## 2019-11-06 ENCOUNTER — Other Ambulatory Visit: Payer: Self-pay | Admitting: *Deleted

## 2019-11-06 DIAGNOSIS — D649 Anemia, unspecified: Secondary | ICD-10-CM

## 2019-11-06 DIAGNOSIS — Z79899 Other long term (current) drug therapy: Secondary | ICD-10-CM

## 2019-11-06 DIAGNOSIS — E559 Vitamin D deficiency, unspecified: Secondary | ICD-10-CM | POA: Diagnosis not present

## 2019-11-06 DIAGNOSIS — R5383 Other fatigue: Secondary | ICD-10-CM | POA: Diagnosis not present

## 2019-11-07 ENCOUNTER — Other Ambulatory Visit (HOSPITAL_COMMUNITY)
Admission: RE | Admit: 2019-11-07 | Discharge: 2019-11-07 | Disposition: A | Discharge status: Home / Self Care | Payer: Medicare Other | Source: Ambulatory Visit | Attending: Cardiology | Admitting: Cardiology

## 2019-11-07 ENCOUNTER — Encounter (HOSPITAL_COMMUNITY)
Admission: RE | Admit: 2019-11-07 | Discharge: 2019-11-07 | Disposition: A | Discharge status: Home / Self Care | Payer: Medicare Other | Source: Ambulatory Visit | Attending: Cardiology | Admitting: Cardiology

## 2019-11-07 ENCOUNTER — Encounter (HOSPITAL_COMMUNITY): Payer: Self-pay

## 2019-11-07 ENCOUNTER — Other Ambulatory Visit: Payer: Self-pay

## 2019-11-07 DIAGNOSIS — Z01812 Encounter for preprocedural laboratory examination: Secondary | ICD-10-CM | POA: Diagnosis not present

## 2019-11-07 DIAGNOSIS — Z20822 Contact with and (suspected) exposure to covid-19: Secondary | ICD-10-CM | POA: Insufficient documentation

## 2019-11-07 HISTORY — DX: Unspecified atrial fibrillation: I48.91

## 2019-11-07 HISTORY — DX: Cardiac arrhythmia, unspecified: I49.9

## 2019-11-07 LAB — CBC WITH DIFFERENTIAL/PLATELET
Abs Immature Granulocytes: 0.01 10*3/uL (ref 0.00–0.07)
Basophils Absolute: 0 10*3/uL (ref 0.0–0.1)
Basophils Relative: 1 %
Eosinophils Absolute: 0.2 10*3/uL (ref 0.0–0.5)
Eosinophils Relative: 3 %
HCT: 30.6 % — ABNORMAL LOW (ref 36.0–46.0)
Hemoglobin: 9.6 g/dL — ABNORMAL LOW (ref 12.0–15.0)
Immature Granulocytes: 0 %
Lymphocytes Relative: 22 %
Lymphs Abs: 1.2 10*3/uL (ref 0.7–4.0)
MCH: 29.7 pg (ref 26.0–34.0)
MCHC: 31.4 g/dL (ref 30.0–36.0)
MCV: 94.7 fL (ref 80.0–100.0)
Monocytes Absolute: 0.6 10*3/uL (ref 0.1–1.0)
Monocytes Relative: 11 %
Neutro Abs: 3.4 10*3/uL (ref 1.7–7.7)
Neutrophils Relative %: 63 %
Platelets: 205 10*3/uL (ref 150–400)
RBC: 3.23 MIL/uL — ABNORMAL LOW (ref 3.87–5.11)
RDW: 14.2 % (ref 11.5–15.5)
WBC: 5.3 10*3/uL (ref 4.0–10.5)
nRBC: 0 % (ref 0.0–0.2)

## 2019-11-07 LAB — SARS CORONAVIRUS 2 (TAT 6-24 HRS): SARS Coronavirus 2: NEGATIVE

## 2019-11-07 LAB — PROTIME-INR
INR: 1.5 — ABNORMAL HIGH (ref 0.8–1.2)
Prothrombin Time: 17.7 seconds — ABNORMAL HIGH (ref 11.4–15.2)

## 2019-11-07 NOTE — Progress Notes (Signed)
Iron and ferritin are low.  Hemoglobin has dropped to 9.6.  Please notify the patient and forward lab work to hematologist.  LDH WNL.  Vitamin B12 WNL. Vitamin D WNL-38.  Please advise the patient to continue maintenance dose of vitamin D.  Creatinine is borderline low-0.97 and GFR is low-53 but improving. Likely due to chronic anemia. Please advise the patient to avoid taking NSAIDs.   Calcium is low.  Please clarify if she is taking a calcium supplement.

## 2019-11-08 DIAGNOSIS — M1991 Primary osteoarthritis, unspecified site: Secondary | ICD-10-CM | POA: Diagnosis not present

## 2019-11-08 DIAGNOSIS — M069 Rheumatoid arthritis, unspecified: Secondary | ICD-10-CM | POA: Diagnosis not present

## 2019-11-08 DIAGNOSIS — E039 Hypothyroidism, unspecified: Secondary | ICD-10-CM | POA: Diagnosis not present

## 2019-11-08 LAB — IRON,TIBC AND FERRITIN PANEL
%SAT: 5 % (calc) — ABNORMAL LOW (ref 16–45)
Ferritin: 7 ng/mL — ABNORMAL LOW (ref 16–288)
Iron: 19 ug/dL — ABNORMAL LOW (ref 45–160)
TIBC: 393 mcg/dL (calc) (ref 250–450)

## 2019-11-08 LAB — CBC WITH DIFFERENTIAL/PLATELET
Absolute Monocytes: 663 cells/uL (ref 200–950)
Basophils Absolute: 31 cells/uL (ref 0–200)
Basophils Relative: 0.5 %
Eosinophils Absolute: 161 cells/uL (ref 15–500)
Eosinophils Relative: 2.6 %
HCT: 30.7 % — ABNORMAL LOW (ref 35.0–45.0)
Hemoglobin: 9.6 g/dL — ABNORMAL LOW (ref 11.7–15.5)
Lymphs Abs: 1538 cells/uL (ref 850–3900)
MCH: 29.1 pg (ref 27.0–33.0)
MCHC: 31.3 g/dL — ABNORMAL LOW (ref 32.0–36.0)
MCV: 93 fL (ref 80.0–100.0)
MPV: 10.3 fL (ref 7.5–12.5)
Monocytes Relative: 10.7 %
Neutro Abs: 3807 cells/uL (ref 1500–7800)
Neutrophils Relative %: 61.4 %
Platelets: 234 10*3/uL (ref 140–400)
RBC: 3.3 10*6/uL — ABNORMAL LOW (ref 3.80–5.10)
RDW: 13 % (ref 11.0–15.0)
Total Lymphocyte: 24.8 %
WBC: 6.2 10*3/uL (ref 3.8–10.8)

## 2019-11-08 LAB — COMPLETE METABOLIC PANEL WITH GFR
AG Ratio: 1.7 (calc) (ref 1.0–2.5)
ALT: 16 U/L (ref 6–29)
AST: 21 U/L (ref 10–35)
Albumin: 4 g/dL (ref 3.6–5.1)
Alkaline phosphatase (APISO): 60 U/L (ref 37–153)
BUN/Creatinine Ratio: 23 (calc) — ABNORMAL HIGH (ref 6–22)
BUN: 22 mg/dL (ref 7–25)
CO2: 30 mmol/L (ref 20–32)
Calcium: 8.5 mg/dL — ABNORMAL LOW (ref 8.6–10.4)
Chloride: 103 mmol/L (ref 98–110)
Creat: 0.97 mg/dL — ABNORMAL HIGH (ref 0.60–0.88)
GFR, Est African American: 62 mL/min/{1.73_m2} (ref >=60)
GFR, Est Non African American: 53 mL/min/{1.73_m2} — ABNORMAL LOW (ref >=60)
Globulin: 2.3 g/dL (calc) (ref 1.9–3.7)
Glucose, Bld: 104 mg/dL — ABNORMAL HIGH (ref 65–99)
Potassium: 3.9 mmol/L (ref 3.5–5.3)
Sodium: 140 mmol/L (ref 135–146)
Total Bilirubin: 0.6 mg/dL (ref 0.2–1.2)
Total Protein: 6.3 g/dL (ref 6.1–8.1)

## 2019-11-08 LAB — QUANTIFERON-TB GOLD PLUS
Mitogen-NIL: 9.94 IU/mL
NIL: 0.03 IU/mL
QuantiFERON-TB Gold Plus: NEGATIVE
TB1-NIL: <0 IU/mL
TB2-NIL: <0 IU/mL

## 2019-11-08 LAB — LACTATE DEHYDROGENASE: LDH: 174 U/L (ref 120–250)

## 2019-11-08 LAB — VITAMIN B12: Vitamin B-12: 445 pg/mL (ref 200–1100)

## 2019-11-08 LAB — VITAMIN D 25 HYDROXY (VIT D DEFICIENCY, FRACTURES): Vit D, 25-Hydroxy: 38 ng/mL (ref 30–100)

## 2019-11-10 ENCOUNTER — Ambulatory Visit (HOSPITAL_COMMUNITY): Payer: Medicare Other | Admitting: Certified Registered Nurse Anesthetist

## 2019-11-10 ENCOUNTER — Ambulatory Visit (HOSPITAL_COMMUNITY)
Admission: RE | Admit: 2019-11-10 | Discharge: 2019-11-10 | Disposition: A | Discharge status: Home / Self Care | Payer: Medicare Other | Source: Ambulatory Visit | Attending: Cardiology | Admitting: Cardiology

## 2019-11-10 ENCOUNTER — Encounter (HOSPITAL_COMMUNITY)
Admission: RE | Disposition: A | Discharge status: Home / Self Care | Payer: Self-pay | Source: Ambulatory Visit | Attending: Cardiology

## 2019-11-10 ENCOUNTER — Encounter (HOSPITAL_COMMUNITY): Payer: Self-pay | Admitting: Cardiology

## 2019-11-10 DIAGNOSIS — Z8249 Family history of ischemic heart disease and other diseases of the circulatory system: Secondary | ICD-10-CM | POA: Insufficient documentation

## 2019-11-10 DIAGNOSIS — Z87891 Personal history of nicotine dependence: Secondary | ICD-10-CM | POA: Insufficient documentation

## 2019-11-10 DIAGNOSIS — Z7901 Long term (current) use of anticoagulants: Secondary | ICD-10-CM | POA: Insufficient documentation

## 2019-11-10 DIAGNOSIS — E039 Hypothyroidism, unspecified: Secondary | ICD-10-CM | POA: Diagnosis not present

## 2019-11-10 DIAGNOSIS — I4891 Unspecified atrial fibrillation: Secondary | ICD-10-CM | POA: Diagnosis not present

## 2019-11-10 DIAGNOSIS — I4819 Other persistent atrial fibrillation: Secondary | ICD-10-CM | POA: Insufficient documentation

## 2019-11-10 DIAGNOSIS — I10 Essential (primary) hypertension: Secondary | ICD-10-CM | POA: Diagnosis not present

## 2019-11-10 HISTORY — PX: CARDIOVERSION: SHX1299

## 2019-11-10 LAB — GLUCOSE, CAPILLARY: Glucose-Capillary: 83 mg/dL (ref 70–99)

## 2019-11-10 SURGERY — CARDIOVERSION
Anesthesia: General

## 2019-11-10 MED ORDER — CHLORHEXIDINE GLUCONATE 0.12 % MT SOLN
15.0000 mL | Freq: Once | OROMUCOSAL | Status: AC
  Administered 2019-11-10: 15 mL via OROMUCOSAL

## 2019-11-10 MED ORDER — ORAL CARE MOUTH RINSE: 15.0000 mL | Freq: Once | OROMUCOSAL | Status: AC

## 2019-11-10 MED ORDER — PROPOFOL 10 MG/ML IV BOLUS
INTRAVENOUS | Status: AC
  Filled 2019-11-10: qty 40

## 2019-11-10 MED ORDER — PROPOFOL 10 MG/ML IV BOLUS
INTRAVENOUS | Status: DC | PRN
  Administered 2019-11-10: 50 mg via INTRAVENOUS
  Administered 2019-11-10: 20 mg via INTRAVENOUS

## 2019-11-10 MED ORDER — LACTATED RINGERS IV SOLN: INTRAVENOUS | Status: DC

## 2019-11-10 NOTE — Progress Notes (Signed)
TB gold negative

## 2019-11-10 NOTE — H&P (Signed)
Procedure H&P  Patient presents for outpatient elective electrical cardioversion for persistent afib. She has been on eliquis at home without missed doses. Please see referenced recent clinic note below for full history. Will plan for cardioversion today.   Carlyle Dolly MD       Clinical Summary Ms. Gu is a 86 y.o.female seen as new patient for the following medical problems.  1. Afib - ER visit 09/2019, sent from outside clinic for new diagnosis of afib - was asymptomatic, unknown duration of afib - started on eliquis 5mg  bid. Discharged on oral diltiazem   - taking dilt 60mg  bid at home. SBPs 120-130s, HRs 100s-130s. Remains asymptomatic.         Past Medical History:  Diagnosis Date  . Arthritis   . GERD (gastroesophageal reflux disease)   . Hyperlipemia   . Hypertension    preventative measures  . Hypothyroidism   . Iron deficiency anemia 05/07/2019  . Migraine   . Thyroid disease   . Vertigo           Allergies  Allergen Reactions  . Feraheme [Ferumoxytol]     Reacted during infusion requiring medical intervention. Increased BP           Current Outpatient Medications  Medication Sig Dispense Refill  . APIXABAN (ELIQUIS) VTE STARTER PACK (10MG  AND 5MG ) Take as directed on package: start with two-5mg  tablets twice daily for 7 days. On day 8, switch to one-5mg  tablet twice daily. 1 each 0  . benazepril-hydrochlorthiazide (LOTENSIN HCT) 20-12.5 MG per tablet Take 0.5 tablets by mouth daily.     . Calcium Carbonate-Vitamin D (CALCIUM + D PO) Take 1 tablet by mouth 2 (two) times daily.     . cetirizine (ZYRTEC) 10 MG tablet Take 10 mg by mouth daily.    Marland Kitchen diltiazem (CARDIZEM) 30 MG tablet Take 1 tablet (30 mg total) by mouth 2 (two) times daily. 60 tablet 1  . diltiazem (CARDIZEM) 60 MG tablet Take 1 tablet (60 mg total) by mouth 2 (two) times daily. 60 tablet 1  . escitalopram (LEXAPRO) 10 MG tablet Take 10 mg by mouth  daily.    Marland Kitchen etanercept (ENBREL SURECLICK) 50 MG/ML injection Inject 0.98 mLs (50 mg total) into the skin once a week. 12 mL 0  . hydroxychloroquine (PLAQUENIL) 200 MG tablet TAKE 1 TABLET BY MOUTH TWICE A DAY 180 tablet 0  . levothyroxine (SYNTHROID, LEVOTHROID) 112 MCG tablet Take 112 mcg by mouth daily before breakfast.     . meclizine (ANTIVERT) 25 MG tablet Take 25 mg by mouth 3 (three) times daily as needed for dizziness.     . traMADol-acetaminophen (ULTRACET) 37.5-325 MG tablet Take 1-2 tablets by mouth every 8 (eight) hours as needed.      No current facility-administered medications for this visit.          Past Surgical History:  Procedure Laterality Date  . APPENDECTOMY    . BREAST BIOPSY Right    benign  . BREAST EXCISIONAL BIOPSY Left    Benign  . BREAST EXCISIONAL BIOPSY Left    Benign  . BREAST LUMPECTOMY  30 yrs ago   Dr. Milbert Coulter- rt breast  . BREAST LUMPECTOMY  15 yrs ago   left- Dr. Romona Curls  . BREAST LUMPECTOMY  01/2011  . CHOLECYSTECTOMY    . COLONOSCOPY    . COLONOSCOPY  04/13/2011   Procedure: COLONOSCOPY;  Surgeon: Rogene Houston, MD;  [facility];  Service: Endoscopy;  Laterality: N/A;  930  . MASTECTOMY, PARTIAL  01/13/2011   Procedure: MASTECTOMY PARTIAL;  Surgeon: Adin Hector, MD;  Location: [facility];  Service: General;  Laterality: Left;  left partial mastectomy with needle locallization  . VAGINAL HYSTERECTOMY     2 partials          Allergies  Allergen Reactions  . Feraheme [Ferumoxytol]     Reacted during infusion requiring medical intervention. Increased BP           Family History  Problem Relation Age of Onset  . Heart disease Mother   . Stroke Sister   . Heart disease Brother   . Atrial fibrillation Sister   . Coronary artery disease Brother   . Lung cancer Son   . Healthy Son   . Colon cancer Neg Hx      Social History Ms. Anacker reports  that she quit smoking about 30 years ago. Her smoking use included cigarettes. She has a 20.00 pack-year smoking history. She has never used smokeless tobacco. Ms. Weger reports no history of alcohol use.   Review of Systems CONSTITUTIONAL: No weight loss, fever, chills, weakness or fatigue.  HEENT: Eyes: No visual loss, blurred vision, double vision or yellow sclerae.No hearing loss, sneezing, congestion, runny nose or sore throat.  SKIN: No rash or itching.  CARDIOVASCULAR: per hpi RESPIRATORY: No shortness of breath, cough or sputum.  GASTROINTESTINAL: No anorexia, nausea, vomiting or diarrhea. No abdominal pain or blood.  GENITOURINARY: No burning on urination, no polyuria NEUROLOGICAL: No headache, dizziness, syncope, paralysis, ataxia, numbness or tingling in the extremities. No change in bowel or bladder control.  MUSCULOSKELETAL: No muscle, back pain, joint pain or stiffness.  LYMPHATICS: No enlarged nodes. No history of splenectomy.  PSYCHIATRIC: No history of depression or anxiety.  ENDOCRINOLOGIC: No reports of sweating, cold or heat intolerance. No polyuria or polydipsia.  Marland Kitchen   Physical Examination    Today's Vitals   10/10/19 0930  BP: 112/78  Pulse: 95  SpO2: 98%  Weight: 138 lb (62.6 kg)  Height: 5\' 6"  (1.676 m)   Body mass index is 22.27 kg/m.  Gen: resting comfortably, no acute distress HEENT: no scleral icterus, pupils equal round and reactive, no palptable cervical adenopathy,  CV: irreg, no m/r/g, no jvd Resp: Clear to auscultation bilaterally GI: abdomen is soft, non-tender, non-distended, normal bowel sounds, no hepatosplenomegaly MSK: extremities are warm, no edema.  Skin: warm, no rash Neuro:  no focal deficits Psych: appropriate affect     Assessment and Plan  1. Afib - new diagnosis, has been asymptomatic - EKG shows afib today, elevated rate - rates remain elevated, will increase dilt to 60mg  tid. Transition to long acting dilt  once stable on dosing - plan for DCCV after Oct 7, which will mark 3 weeks of anticoag. She is to call us and udpate [place] on her avaialbility       Arnoldo Lenis, M.D.

## 2019-11-10 NOTE — Anesthesia Preprocedure Evaluation (Signed)
Anesthesia Evaluation  Patient identified by MRN, date of birth, ID band Patient awake    Reviewed: Allergy & Precautions, H&P , NPO status , Patient's Chart, lab work & pertinent test results, reviewed documented beta blocker date and time   Airway Mallampati: II  TM Distance: >3 FB Neck ROM: full    Dental no notable dental hx. (+) Teeth Intact   Pulmonary neg pulmonary ROS, former smoker,    Pulmonary exam normal breath sounds clear to auscultation       Cardiovascular Exercise Tolerance: Good hypertension, + dysrhythmias Atrial Fibrillation  Rhythm:regular Rate:Normal     Neuro/Psych  Headaches,  Neuromuscular disease negative psych ROS   GI/Hepatic Neg liver ROS, GERD  Medicated,  Endo/Other  Hypothyroidism   Renal/GU negative Renal ROS  negative genitourinary   Musculoskeletal   Abdominal   Peds  Hematology  (+) Blood dyscrasia, anemia ,   Anesthesia Other Findings   Reproductive/Obstetrics negative OB ROS                             Anesthesia Physical  Anesthesia Plan  ASA: III  Anesthesia Plan: General   Post-op Pain Management:    Induction:   PONV Risk Score and Plan: Propofol infusion  Airway Management Planned:   Additional Equipment:   Intra-op Plan:   Post-operative Plan:   Informed Consent: I have reviewed the patients History and Physical, chart, labs and discussed the procedure including the risks, benefits and alternatives for the proposed anesthesia with the patient or authorized representative who has indicated his/her understanding and acceptance.     Dental Advisory Given  Plan Discussed with: CRNA  Anesthesia Plan Comments:         Anesthesia Quick Evaluation  

## 2019-11-10 NOTE — CV Procedure (Signed)
CV procedure note   Procedure: electrical cardioversion Physician: Dr Carlyle Dolly MD Indication: persistent atrial fibrillation   Patient brought to the procedure suite after appropriate consent was obtained. Defib pads placed in the anterior and posterior positions. Anesthesia assisted with sedation, please see there note for full details. She was succesfully converted from afib to normal sinus rhythm with a single synchronized 200J shock. Cardiopulmonary monitoring was performed throughout the procedure, she tolerated well without complications.   Carlyle Dolly MD

## 2019-11-10 NOTE — Anesthesia Postprocedure Evaluation (Signed)
Anesthesia Post Note  Patient: Erin Schmidt  Procedure(s) Performed: CARDIOVERSION (N/A )  Patient location during evaluation: PACU Anesthesia Type: General Level of consciousness: awake and alert Pain management: pain level controlled Vital Signs Assessment: post-procedure vital signs reviewed and stable Respiratory status: spontaneous breathing, nonlabored ventilation, respiratory function stable and patient connected to nasal cannula oxygen Cardiovascular status: stable and blood pressure returned to baseline Postop Assessment: no apparent nausea or vomiting Anesthetic complications: no   No complications documented.   Last Vitals:  Vitals:   11/10/19 0911  BP: 123/71  Pulse: 86  Resp: (!) 22  Temp: 36.6 C  SpO2: 98%    Last Pain:  Vitals:   11/10/19 0911  TempSrc: Oral  PainSc: 0-No pain                 Talitha Givens

## 2019-11-10 NOTE — Transfer of Care (Signed)
Immediate Anesthesia Transfer of Care Note  Patient: Erin Schmidt  Procedure(s) Performed: CARDIOVERSION (N/A )  Patient Location: PACU  Anesthesia Type:MAC  Level of Consciousness: awake, alert  and oriented  Airway & Oxygen Therapy: Patient Spontanous Breathing and Patient connected to nasal cannula oxygen  Post-op Assessment: Report given to RN, Post -op Vital signs reviewed and stable and Patient moving all extremities X 4  Post vital signs: Reviewed and stable  Last Vitals:  Vitals Value Taken Time  BP    Temp    Pulse    Resp    SpO2      Last Pain:  Vitals:   11/10/19 0911  TempSrc: Oral  PainSc: 0-No pain         Complications: No complications documented.

## 2019-11-10 NOTE — Discharge Instructions (Signed)
Electrical Cardioversion Electrical cardioversion is the delivery of a jolt of electricity to restore a normal rhythm to the heart. A rhythm that is too fast or is not regular keeps the heart from pumping well. In this procedure, sticky patches or metal paddles are placed on the chest to deliver electricity to the heart from a device. This procedure may be done in an emergency if:  There is low or no blood pressure as a result of the heart rhythm.  Normal rhythm must be restored as fast as possible to protect the brain and heart from further damage.  It may save a life. This may also be a scheduled procedure for irregular or fast heart rhythms that are not immediately life-threatening. Tell a health care provider about:  Any allergies you have.  All medicines you are taking, including vitamins, herbs, eye drops, creams, and over-the-counter medicines.  Any problems you or family members have had with anesthetic medicines.  Any blood disorders you have.  Any surgeries you have had.  Any medical conditions you have.  Whether you are pregnant or may be pregnant. What are the risks? Generally, this is a safe procedure. However, problems may occur, including:  Allergic reactions to medicines.  A blood clot that breaks free and travels to other parts of your body.  The possible return of an abnormal heart rhythm within hours or days after the procedure.  Your heart stopping (cardiac arrest). This is rare. What happens before the procedure? Medicines  Your health care provider may have you start taking: ? Blood-thinning medicines (anticoagulants) so your blood does not clot as easily. ? Medicines to help stabilize your heart rate and rhythm.  Ask your health care provider about: ? Changing or stopping your regular medicines. This is especially important if you are taking diabetes medicines or blood thinners. ? Taking medicines such as aspirin and ibuprofen. These medicines can  thin your blood. Do not take these medicines unless your health care provider tells you to take them. ? Taking over-the-counter medicines, vitamins, herbs, and supplements. General instructions  Follow instructions from your health care provider about eating or drinking restrictions.  Plan to have someone take you home from the hospital or clinic.  If you will be going home right after the procedure, plan to have someone with you for 24 hours.  Ask your health care provider what steps will be taken to help prevent infection. These may include washing your skin with a germ-killing soap. What happens during the procedure?   An IV will be inserted into one of your veins.  Sticky patches (electrodes) or metal paddles may be placed on your chest.  You will be given a medicine to help you relax (sedative).  An electrical shock will be delivered. The procedure may vary among health care providers and hospitals. What can I expect after the procedure?  Your blood pressure, heart rate, breathing rate, and blood oxygen level will be monitored until you leave the hospital or clinic.  Your heart rhythm will be watched to make sure it does not change.  You may have some redness on the skin where the shocks were given. Follow these instructions at home:  Do not drive for 24 hours if you were given a sedative during your procedure.  Take over-the-counter and prescription medicines only as told by your health care provider.  Ask your health care provider how to check your pulse. Check it often.  Rest for 48 hours after the procedure or   as told by your health care provider.  Avoid or limit your caffeine use as told by your health care provider.  Keep all follow-up visits as told by your health care provider. This is important. Contact a health care provider if:  You feel like your heart is beating too quickly or your pulse is not regular.  You have a serious muscle cramp that does not go  away. Get help right away if:  You have discomfort in your chest.  You are dizzy or you feel faint.  You have trouble breathing or you are short of breath.  Your speech is slurred.  You have trouble moving an arm or leg on one side of your body.  Your fingers or toes turn cold or blue. Summary  Electrical cardioversion is the delivery of a jolt of electricity to restore a normal rhythm to the heart.  This procedure may be done right away in an emergency or may be a scheduled procedure if the condition is not an emergency.  Generally, this is a safe procedure.  After the procedure, check your pulse often as told by your health care provider. This information is not intended to replace advice given to you by your health care provider. Make sure you discuss any questions you have with your health care provider. Document Revised: 07/29/2018 Document Reviewed: 07/29/2018 Elsevier Patient Education  2020 [employer]  

## 2019-11-10 NOTE — Progress Notes (Signed)
Electrical Cardioversion Procedure Note Erin Schmidt [id number] 01-07-1933  Procedure: Electrical Cardioversion Indications:  Atrial Fibrillation  Procedure Details Consent: informed consent Time Out: Verified patient identification, verified procedure, site/side was marked, verified correct patient position, special equipment/implants available, medications/allergies/relevent history reviewed, required imaging and test results available.  time out performed:1053  Patient placed on cardiac monitor, pulse oximetry, supplemental oxygen as necessary.  Sedation given: Propofol given Pacer pads placed anterior, posterior.  Cardioverted 1 time(s).  Cardioverted at 200 joules.  Evaluation Findings: Post procedure EKG shows: normal sinus rhythm Complications:none Patient did tolerate procedure well.   Harmon Pier 11/10/2019, 2:24 PM

## 2019-11-11 ENCOUNTER — Telehealth: Payer: Self-pay

## 2019-11-11 NOTE — Telephone Encounter (Signed)
Returned patient's call and reviewed lab work with patient. See lab result note for details.

## 2019-11-11 NOTE — Telephone Encounter (Signed)
Patient called stating she was returning your call.   °

## 2019-11-12 ENCOUNTER — Encounter (HOSPITAL_COMMUNITY): Payer: Self-pay | Admitting: Cardiology

## 2019-11-18 ENCOUNTER — Other Ambulatory Visit: Payer: Self-pay | Admitting: *Deleted

## 2019-11-18 ENCOUNTER — Telehealth: Payer: Self-pay

## 2019-11-18 ENCOUNTER — Telehealth: Payer: Self-pay | Admitting: Student

## 2019-11-18 MED ORDER — ENBREL SURECLICK 50 MG/ML ~~LOC~~ SOAJ: 50.0000 mg | SUBCUTANEOUS | 0 refills | Status: DC

## 2019-11-18 NOTE — Telephone Encounter (Signed)
Refill request received via fax  Last Visit: 09/04/2019 Next Visit: 02/05/2020 Labs: 11/06/2019 Iron and ferritin are low. Hemoglobin has dropped to 9.6. Creatinine is borderline low-0.97 and GFR is low-53 but improving. Calcium is low. TB Gold: 11/06/2019 Neg   Current Dose per office note on 7/34/0370: Enbrel Sureclick 50 mg every 7 days  Dx: Rheumatoid arthritis of multiple sites without rheumatoid factor   Okay to refill Enbrel?

## 2019-11-18 NOTE — Telephone Encounter (Signed)
New message    STAT if HR is under 50 or over 120 (normal HR is 60-100 beats per minute)  1) What is your heart rate? 54 40 106 129 97 (9p-10p) she takes hr every evening   2) Do you have a log of your heart rate readings (document readings)? Yes   3) Do you have any other symptoms? No symptoms   Patient had cardioversion and she was doing fine then it started going out of range about a week now

## 2019-11-18 NOTE — Telephone Encounter (Signed)
Attempted to contact the patient and left message for patient to call the office.  

## 2019-11-18 NOTE — Telephone Encounter (Signed)
Patient called stating she received her application for [employer] and filled out her section and mailed it to our office on 11/18/19.  Patient states she put attention Seth Bake on the letter because she was not sure who was handling the applications now that the pharmacist is gone.

## 2019-11-19 NOTE — Telephone Encounter (Signed)
Spoke with pt who denies any symptoms at this time. Pt has appt on tomorrow with B.Strader PA-C. Pt confirmed that she will at the visit.

## 2019-11-19 NOTE — Telephone Encounter (Signed)
    Thanks for the update. Her variable HR is concerning that she might have gone back into atrial fibrillation after her recent cardioversion. Will obtain a 12-Lead EKG tomorrow. If she develops any progressive dyspnea or chest pain in the interim, she should seek emergent evaluation.   Signed, Erma Heritage, PA-C 11/19/2019, 10:32 AM Pager: [phone]

## 2019-11-19 NOTE — Telephone Encounter (Signed)
Called to notify pt. No answer. Unable to leave msg.

## 2019-11-20 ENCOUNTER — Ambulatory Visit: Payer: Medicare Other | Admitting: Student

## 2019-11-20 ENCOUNTER — Other Ambulatory Visit: Payer: Self-pay | Admitting: Student

## 2019-11-20 ENCOUNTER — Other Ambulatory Visit: Payer: Self-pay

## 2019-11-20 ENCOUNTER — Encounter: Payer: Self-pay | Admitting: Student

## 2019-11-20 VITALS — BP 108/56 | HR 74 | Ht 66.5 in | Wt 149.0 lb

## 2019-11-20 DIAGNOSIS — I340 Nonrheumatic mitral (valve) insufficiency: Secondary | ICD-10-CM | POA: Diagnosis not present

## 2019-11-20 DIAGNOSIS — D509 Iron deficiency anemia, unspecified: Secondary | ICD-10-CM | POA: Diagnosis not present

## 2019-11-20 DIAGNOSIS — I4819 Other persistent atrial fibrillation: Secondary | ICD-10-CM | POA: Diagnosis not present

## 2019-11-20 DIAGNOSIS — I10 Essential (primary) hypertension: Secondary | ICD-10-CM | POA: Diagnosis not present

## 2019-11-20 MED ORDER — DILTIAZEM HCL ER COATED BEADS 180 MG PO CP24: 180.0000 mg | ORAL_CAPSULE | Freq: Every day | ORAL | 5 refills | Status: DC

## 2019-11-20 MED ORDER — FUROSEMIDE 40 MG PO TABS: 40.0000 mg | ORAL_TABLET | Freq: Every day | ORAL | 11 refills | Status: DC

## 2019-11-20 NOTE — Patient Instructions (Signed)
Medication Instructions:  Your physician has recommended you make the following change in your medication:   Stop Taking Benazepril-HCTZ  Stop Taking Cardizem 30 mg   Start taking Cardizem 180 mg Daily   *If you need a refill on your cardiac medications before your next appointment, please call your pharmacy*   Lab Work: NONE   If you have labs (blood work) drawn today and your tests are completely normal, you will receive your results only by: Marland Kitchen MyChart Message (if you have MyChart) OR . A paper copy in the mail If you have any lab test that is abnormal or we need to change your treatment, we will call you to review the results.   Testing/Procedures: NONE    Follow-Up: At [facility], you and your health needs are our priority.  As part of our continuing mission to provide you with exceptional heart care, we have created designated Provider Care Teams.  These Care Teams include your primary Cardiologist (physician) and Advanced Practice Providers (APPs -  Physician Assistants and Nurse Practitioners) who all work together to provide you with the care you need, when you need it.  We recommend signing up for the patient portal called "MyChart".  Sign up information is provided on this After Visit Summary.  MyChart is used to connect with patients for Virtual Visits (Telemedicine).  Patients are able to view lab/test results, encounter notes, upcoming appointments, etc.  Non-urgent messages can be sent to your provider as well.   To learn more about what you can do with MyChart, go to [email or web address].    Your next appointment:   6-8 week(s)  The format for your next appointment:   In Person  Provider:   Carlyle Dolly, MD or Bernerd Pho, PA-C   Other Instructions Thank you for choosing [facility]!  Complete blood pressure log for 2 weeks and drop off at office.

## 2019-11-20 NOTE — Telephone Encounter (Signed)
Patient to be seen in office today

## 2019-11-20 NOTE — Progress Notes (Signed)
Cardiology Office Note    Date:  11/20/2019   ID:  Erin Schmidt, DOB 1932/09/20, MRN [id number]  PCP:  Sharilyn Sites, MD  Cardiologist: Carlyle Dolly, MD    Chief Complaint  Patient presents with  . Follow-up    s/p DCCV    History of Present Illness:    Erin Schmidt is a 86 y.o. female with past medical history of persistent atrial fibrillation, HTN, HLD, hypothyroidism and iron deficiency anemia who presents to the office today for follow-up from her recent cardioversion.  She was examined by Dr. Harl Bowie on 10/10/2019 for follow-up for a recent Emergency Department visit after having been diagnosed with atrial fibrillation with RVR. She was taking Cardizem 60 mg twice daily and heart rate was still remaining elevated in the 100's to 130's and she was on Eliquis 5 mg twice daily for anticoagulation. Cardioversion was recommended when she had been on anticoagulation for 3 weeks. A follow-up echocardiogram was obtained in the interim and showed her EF was low-normal at 50 to 55% and RV function was normal. She did have severe dilation of the left atrium and moderate mitral valve regurgitation.  She underwent cardioversion on 11/10/2019 and converted from atrial fibrillation to normal sinus rhythm with a single synchronized shock at 200 J. It does not appear Cardizem dosing was adjusted following the procedure and she was continued on Cardizem 60mg  TID.  In talking the patient today, she reports she has overall been asymptomatic with her atrial fibrillation. She did not notice a change in her respiratory status or energy level following her cardioversion. Approximately 5 days after the procedure, she started to notice a fluctuation in her heart rate at home as this has been variable from the 50's to low 100's. She denies any associated palpitations, chest pain or dyspnea. No recent orthopnea, PND or lower extremity edema.  She has been tolerating Eliquis well and denies any  evidence of active bleeding.   Past Medical History:  Diagnosis Date  . Arthritis   . Atrial fibrillation ([place])   . Dysrhythmia   . GERD (gastroesophageal reflux disease)   . Hyperlipemia   . Hypertension    preventative measures  . Hypothyroidism   . Iron deficiency anemia 05/07/2019  . Migraine   . Thyroid disease   . Vertigo     Past Surgical History:  Procedure Laterality Date  . APPENDECTOMY    . BREAST BIOPSY Right    benign  . BREAST EXCISIONAL BIOPSY Left    Benign  . BREAST EXCISIONAL BIOPSY Left    Benign  . BREAST LUMPECTOMY  30 yrs ago   Dr. Milbert Coulter- rt breast  . BREAST LUMPECTOMY  15 yrs ago   left- Dr. Romona Curls  . BREAST LUMPECTOMY  01/2011  . CARDIOVERSION N/A 11/10/2019   Procedure: CARDIOVERSION;  Surgeon: Arnoldo Lenis, MD;  Location: [facility];  Service: Endoscopy;  Laterality: N/A;  . CHOLECYSTECTOMY    . COLONOSCOPY    . COLONOSCOPY  04/13/2011   Procedure: COLONOSCOPY;  Surgeon: Rogene Houston, MD;  Location: [facility];  Service: Endoscopy;  Laterality: N/A;  930  . MASTECTOMY, PARTIAL  01/13/2011   Procedure: MASTECTOMY PARTIAL;  Surgeon: Adin Hector, MD;  Location: [facility];  Service: General;  Laterality: Left;  left partial mastectomy with needle locallization  . VAGINAL HYSTERECTOMY     2 partials    Current Medications: Outpatient Medications Prior to Visit  Medication Sig Dispense  Refill  . apixaban (ELIQUIS) 5 MG TABS tablet Take 1 tablet (5 mg total) by mouth 2 (two) times daily. 60 tablet 3  . Calcium Carbonate-Vitamin D (CALCIUM + D PO) Take 1 tablet by mouth 2 (two) times daily.     . cetirizine (ZYRTEC) 10 MG tablet Take 10 mg by mouth daily.    . Cholecalciferol (VITAMIN D3 PO) Take by mouth.    . escitalopram (LEXAPRO) 10 MG tablet Take 10 mg by mouth daily.    Marland Kitchen etanercept (ENBREL SURECLICK) 50 MG/ML injection Inject 50 mg into the skin once a week. 12 mL 0  . hydroxychloroquine (PLAQUENIL) 200 MG tablet  TAKE 1 TABLET BY MOUTH TWICE A DAY (Patient taking differently: Take 200 mg by mouth 2 (two) times daily. ) 180 tablet 0  . levothyroxine (SYNTHROID, LEVOTHROID) 112 MCG tablet Take 112 mcg by mouth daily before breakfast.     . meclizine (ANTIVERT) 25 MG tablet Take 25 mg by mouth 3 (three) times daily as needed for dizziness.     . traMADol-acetaminophen (ULTRACET) 37.5-325 MG tablet Take 1 tablet by mouth in the morning and at bedtime.     . benazepril-hydrochlorthiazide (LOTENSIN HCT) 20-12.5 MG per tablet Take 0.5 tablets by mouth daily.     Marland Kitchen diltiazem (CARDIZEM) 30 MG tablet Take 1 tablet (30 mg total) by mouth 2 (two) times daily. 60 tablet 1  . diltiazem (CARDIZEM) 60 MG tablet Take 1 tablet (60 mg total) by mouth 3 (three) times daily. 270 tablet 1   No facility-administered medications prior to visit.     Allergies:   Feraheme [ferumoxytol]   Social History   Socioeconomic History  . Marital status: Married    Spouse name: Not on file  . Number of children: 2  . Years of education: Not on file  . Highest education level: Not on file  Occupational History  . Occupation: RETIRED  Tobacco Use  . Smoking status: Former Smoker    Packs/day: 1.00    Years: 20.00    Pack years: 20.00    Types: Cigarettes    Quit date: 01/05/1989    Years since quitting: 30.8  . Smokeless tobacco: Never Used  Vaping Use  . Vaping Use: Never used  Substance and Sexual Activity  . Alcohol use: No  . Drug use: Never  . Sexual activity: Not on file  Other Topics Concern  . Not on file  Social History Narrative  . Not on file   Social Determinants of Health   Financial Resource Strain:   . Difficulty of Paying Living Expenses: Not on file  Food Insecurity:   . Worried About Charity fundraiser in the Last Year: Not on file  . Ran Out of Food in the Last Year: Not on file  Transportation Needs:   . Lack of Transportation (Medical): Not on file  . Lack of Transportation (Non-Medical):  Not on file  Physical Activity:   . Days of Exercise per Week: Not on file  . Minutes of Exercise per Session: Not on file  Stress:   . Feeling of Stress : Not on file  Social Connections:   . Frequency of Communication with Friends and Family: Not on file  . Frequency of Social Gatherings with Friends and Family: Not on file  . Attends Religious Services: Not on file  . Active Member of Clubs or Organizations: Not on file  . Attends Archivist Meetings: Not on file  .  Marital Status: Not on file     Family History:  The patient's family history includes Atrial fibrillation in her sister; Coronary artery disease in her brother; Healthy in her son; Heart disease in her brother and mother; Lung cancer in her son; Stroke in her sister.   Review of Systems:   Please see the history of present illness.     General:  No chills, fever, night sweats or weight changes.  Cardiovascular:  No chest pain, dyspnea on exertion, edema, orthopnea, palpitations, paroxysmal nocturnal dyspnea. Dermatological: No rash, lesions/masses Respiratory: No cough, dyspnea Urologic: No hematuria, dysuria Abdominal:   No nausea, vomiting, diarrhea, bright red blood per rectum, melena, or hematemesis Neurologic:  No visual changes, wkns, changes in mental status. All other systems reviewed and are otherwise negative except as noted above.   Physical Exam:    VS:  BP (!) 108/56   Pulse 74   Ht 5' 6.5" (1.689 m)   Wt 149 lb (67.6 kg)   SpO2 97%   BMI 23.69 kg/m    General: Well developed, well nourished,female appearing in no acute distress. Head: Normocephalic, atraumatic. Neck: No carotid bruits. JVD not elevated.  Lungs: Respirations regular and unlabored, without wheezes or rales.  Heart: Irregularly irregular. No S3 or S4.  No murmur, no rubs, or gallops appreciated. Abdomen: Appears non-distended. No obvious abdominal masses. Msk:  Strength and tone appear normal for age. No obvious joint  deformities or effusions. Extremities: No clubbing or cyanosis. No edema.  Distal pedal pulses are 2+ bilaterally. Neuro: Alert and oriented X 3. Moves all extremities spontaneously. No focal deficits noted. Psych:  Responds to questions appropriately with a normal affect. Skin: No rashes or lesions noted  Wt Readings from Last 3 Encounters:  11/20/19 149 lb (67.6 kg)  11/07/19 150 lb (68 kg)  10/10/19 138 lb (62.6 kg)     Studies/Labs Reviewed:   EKG:  EKG is ordered today. The ekg ordered today demonstrates rate-controlled atrial fibrillation, HR 74 with RBBB.   Recent Labs: 09/25/2019: Magnesium 2.1; TSH 2.011 11/06/2019: ALT 16; BUN 22; Creat 0.97; Potassium 3.9; Sodium 140 11/07/2019: Hemoglobin 9.6; Platelets 205   Lipid Panel No results found for: CHOL, TRIG, HDL, CHOLHDL, VLDL, LDLCALC, LDLDIRECT  Additional studies/ records that were reviewed today include:   Echocardiogram: 10/2019 IMPRESSIONS    1. Left ventricular ejection fraction, by estimation, is 50 to 55%. The  left ventricle has normal function. The left ventricle has no regional  wall motion abnormalities. Left ventricular diastolic parameters are  indeterminate.  2. Right ventricular systolic function is normal. The right ventricular  size is normal. There is mildly elevated pulmonary artery systolic  pressure.  3. Left atrial size was severely dilated.  4. Right atrial size was moderately dilated.  5. The mitral valve is normal in structure. Moderate mitral valve  regurgitation. No evidence of mitral stenosis.  6. Tricuspid valve regurgitation is mild to moderate.  7. The aortic valve is tricuspid. Aortic valve regurgitation is not  visualized. No aortic stenosis is present.  8. The inferior vena cava is dilated in size with <50% respiratory  variability, suggesting right atrial pressure of 15 mmHg.   Assessment:    1. Persistent atrial fibrillation ([place])   2. Essential hypertension   3.  Mitral valve insufficiency, unspecified etiology   4. Iron deficiency anemia, unspecified iron deficiency anemia type      Plan:   In order of problems listed above:  1. Persistent  Atrial Fibrillation - It is unknown how long she was in atrial fibrillation prior to her recent diagnosis but she did have severe LA dilation by her echocardiogram. Underwent DCCV on 11/10/2019 and converted to NSR but has since gone back into atrial fibrillation.  - She is asymptomatic and denies any palpitations, dyspnea or fatigue. Did not feel different when briefly back in NSR. Reviewed options with the patient and her husband today in regards to rate vs. rhythm control strategy (as she would likely require antiarrhythmic therapy long-term) and she wishes to pursue a rate-control strategy for now given her asymptomatic state. Her HR has been variable at home but she sometimes misses her TID dosing of Cardizem. Will consolidate Cardizem 60mg  TID to Cardizem CD 180mg  daily. She will report back with HR/BP readings in 2 weeks as she may require further titration of this.  - She denies any evidence of active bleeding. Remains on Eliquis 5mg  BID for anticoagulation.  2. HTN - BP is well-controlled at 108/56 during today's visit. Will consolidate short-acting Cardizem to Cardizem CD 180mg  daily. She is currently taking a half-tablet of Benazepril-HCTZ 20-12.5mg  and I recommended she stop this for now to allow for further titration of AV nodal blocking agents as Cardizem may need to be further titrated in the future pending reassessment of HR/BP.   3. Mitral Regurgitation - Moderate by echocardiogram in 10/2019. Will continue to follow.   4. Iron Deficiency Anemia - Followed by Hematology. Hgb was stable at 9.6 by recent labs on 11/07/2019.   Medication Adjustments/Labs and Tests Ordered: Current medicines are reviewed at length with the patient today.  Concerns regarding medicines are outlined above.  Medication  changes, Labs and Tests ordered today are listed in the Patient Instructions below. Patient Instructions  Medication Instructions:  Your physician has recommended you make the following change in your medication:   Stop Taking Benazepril-HCTZ  Stop Taking Cardizem 30 mg   Start taking Cardizem 180 mg Daily   *If you need a refill on your cardiac medications before your next appointment, please call your pharmacy*   Lab Work: NONE   If you have labs (blood work) drawn today and your tests are completely normal, you will receive your results only by: Marland Kitchen MyChart Message (if you have MyChart) OR . A paper copy in the mail If you have any lab test that is abnormal or we need to change your treatment, we will call you to review the results.   Testing/Procedures: NONE    Follow-Up: At [facility], you and your health needs are our priority.  As part of our continuing mission to provide you with exceptional heart care, we have created designated Provider Care Teams.  These Care Teams include your primary Cardiologist (physician) and Advanced Practice Providers (APPs -  Physician Assistants and Nurse Practitioners) who all work together to provide you with the care you need, when you need it.  We recommend signing up for the patient portal called "MyChart".  Sign up information is provided on this After Visit Summary.  MyChart is used to connect with patients for Virtual Visits (Telemedicine).  Patients are able to view lab/test results, encounter notes, upcoming appointments, etc.  Non-urgent messages can be sent to your provider as well.   To learn more about what you can do with MyChart, go to [email or web address].    Your next appointment:   6-8 week(s)  The format for your next appointment:   In Person  Provider:  Carlyle Dolly, MD or Bernerd Pho, PA-C   Other Instructions Thank you for choosing [facility]!  Complete blood pressure log for 2 weeks and  drop off at office.      Signed, Erma Heritage, PA-C  11/20/2019 7:56 PM    [facility] [address] [place], [place] [address] Phone: [phone] Fax: [phone]

## 2019-11-21 ENCOUNTER — Telehealth: Payer: Self-pay | Admitting: Pharmacist

## 2019-11-21 NOTE — Progress Notes (Signed)
I think rate control is fine. Did a DCCV after initial diagnosis of afib to see if may maintain SR but I agree no indication for further attempts at rhythm control at this time.   Zandra Abts MD

## 2019-11-21 NOTE — Telephone Encounter (Addendum)
Received Page 1 of 4 of patient application from Ms. Ronnald Ramp for Enbrel through [employer]. This is a reapplication. Remailing the pages that we need with her signature. Attempted to call but no voicemail box. Sent MyChart message to patient to notify of the pages coming to her.  Knox Saliva, PharmD, MPH Clinical Pharmacist (Rheumatology and Pulmonology)

## 2019-12-01 ENCOUNTER — Inpatient Hospital Stay (HOSPITAL_COMMUNITY): Payer: Medicare Other

## 2019-12-01 ENCOUNTER — Inpatient Hospital Stay (HOSPITAL_COMMUNITY)
Admission: EM | Admit: 2019-12-01 | Discharge: 2019-12-03 | DRG: 291 | Disposition: A | Discharge status: Home / Self Care | Payer: Medicare Other | Attending: Internal Medicine | Admitting: Internal Medicine

## 2019-12-01 ENCOUNTER — Encounter (HOSPITAL_COMMUNITY): Payer: Self-pay | Admitting: Emergency Medicine

## 2019-12-01 ENCOUNTER — Emergency Department (HOSPITAL_COMMUNITY): Payer: Medicare Other

## 2019-12-01 ENCOUNTER — Other Ambulatory Visit: Payer: Self-pay

## 2019-12-01 DIAGNOSIS — D509 Iron deficiency anemia, unspecified: Secondary | ICD-10-CM | POA: Diagnosis present

## 2019-12-01 DIAGNOSIS — I4819 Other persistent atrial fibrillation: Secondary | ICD-10-CM | POA: Diagnosis not present

## 2019-12-01 DIAGNOSIS — Z7901 Long term (current) use of anticoagulants: Secondary | ICD-10-CM

## 2019-12-01 DIAGNOSIS — R0602 Shortness of breath: Secondary | ICD-10-CM | POA: Diagnosis not present

## 2019-12-01 DIAGNOSIS — Z79890 Hormone replacement therapy (postmenopausal): Secondary | ICD-10-CM | POA: Diagnosis not present

## 2019-12-01 DIAGNOSIS — I4891 Unspecified atrial fibrillation: Principal | ICD-10-CM

## 2019-12-01 DIAGNOSIS — I472 Ventricular tachycardia: Secondary | ICD-10-CM | POA: Diagnosis not present

## 2019-12-01 DIAGNOSIS — N1330 Unspecified hydronephrosis: Secondary | ICD-10-CM | POA: Diagnosis not present

## 2019-12-01 DIAGNOSIS — I509 Heart failure, unspecified: Secondary | ICD-10-CM

## 2019-12-01 DIAGNOSIS — Z20822 Contact with and (suspected) exposure to covid-19: Secondary | ICD-10-CM | POA: Diagnosis present

## 2019-12-01 DIAGNOSIS — Z87891 Personal history of nicotine dependence: Secondary | ICD-10-CM | POA: Diagnosis not present

## 2019-12-01 DIAGNOSIS — D638 Anemia in other chronic diseases classified elsewhere: Secondary | ICD-10-CM | POA: Diagnosis not present

## 2019-12-01 DIAGNOSIS — Z9049 Acquired absence of other specified parts of digestive tract: Secondary | ICD-10-CM | POA: Diagnosis not present

## 2019-12-01 DIAGNOSIS — Z9012 Acquired absence of left breast and nipple: Secondary | ICD-10-CM | POA: Diagnosis not present

## 2019-12-01 DIAGNOSIS — I5033 Acute on chronic diastolic (congestive) heart failure: Secondary | ICD-10-CM | POA: Diagnosis not present

## 2019-12-01 DIAGNOSIS — Z79899 Other long term (current) drug therapy: Secondary | ICD-10-CM

## 2019-12-01 DIAGNOSIS — Z743 Need for continuous supervision: Secondary | ICD-10-CM | POA: Diagnosis not present

## 2019-12-01 DIAGNOSIS — I2510 Atherosclerotic heart disease of native coronary artery without angina pectoris: Secondary | ICD-10-CM | POA: Diagnosis present

## 2019-12-01 DIAGNOSIS — E785 Hyperlipidemia, unspecified: Secondary | ICD-10-CM | POA: Diagnosis not present

## 2019-12-01 DIAGNOSIS — K219 Gastro-esophageal reflux disease without esophagitis: Secondary | ICD-10-CM | POA: Diagnosis not present

## 2019-12-01 DIAGNOSIS — K5730 Diverticulosis of large intestine without perforation or abscess without bleeding: Secondary | ICD-10-CM | POA: Diagnosis not present

## 2019-12-01 DIAGNOSIS — E876 Hypokalemia: Secondary | ICD-10-CM | POA: Diagnosis not present

## 2019-12-01 DIAGNOSIS — I110 Hypertensive heart disease with heart failure: Secondary | ICD-10-CM | POA: Diagnosis not present

## 2019-12-01 DIAGNOSIS — R0689 Other abnormalities of breathing: Secondary | ICD-10-CM | POA: Diagnosis not present

## 2019-12-01 DIAGNOSIS — Z90710 Acquired absence of both cervix and uterus: Secondary | ICD-10-CM

## 2019-12-01 DIAGNOSIS — E041 Nontoxic single thyroid nodule: Secondary | ICD-10-CM | POA: Diagnosis not present

## 2019-12-01 DIAGNOSIS — E039 Hypothyroidism, unspecified: Secondary | ICD-10-CM | POA: Diagnosis not present

## 2019-12-01 DIAGNOSIS — M549 Dorsalgia, unspecified: Secondary | ICD-10-CM | POA: Diagnosis not present

## 2019-12-01 DIAGNOSIS — N3289 Other specified disorders of bladder: Secondary | ICD-10-CM | POA: Diagnosis not present

## 2019-12-01 DIAGNOSIS — I517 Cardiomegaly: Secondary | ICD-10-CM | POA: Diagnosis not present

## 2019-12-01 DIAGNOSIS — E049 Nontoxic goiter, unspecified: Secondary | ICD-10-CM

## 2019-12-01 DIAGNOSIS — I499 Cardiac arrhythmia, unspecified: Secondary | ICD-10-CM | POA: Diagnosis not present

## 2019-12-01 LAB — FOLATE: Folate: 28.3 ng/mL (ref >5.9)

## 2019-12-01 LAB — COMPREHENSIVE METABOLIC PANEL
ALT: 20 U/L (ref 0–44)
AST: 25 U/L (ref 15–41)
Albumin: 3.8 g/dL (ref 3.5–5.0)
Alkaline Phosphatase: 56 U/L (ref 38–126)
Anion gap: 10 (ref 5–15)
BUN: 20 mg/dL (ref 8–23)
CO2: 21 mmol/L — ABNORMAL LOW (ref 22–32)
Calcium: 8.7 mg/dL — ABNORMAL LOW (ref 8.9–10.3)
Chloride: 104 mmol/L (ref 98–111)
Creatinine, Ser: 0.98 mg/dL (ref 0.44–1.00)
GFR, Estimated: 57 mL/min — ABNORMAL LOW (ref >60)
Glucose, Bld: 122 mg/dL — ABNORMAL HIGH (ref 70–99)
Potassium: 3.6 mmol/L (ref 3.5–5.1)
Sodium: 135 mmol/L (ref 135–145)
Total Bilirubin: 0.5 mg/dL (ref 0.3–1.2)
Total Protein: 6.6 g/dL (ref 6.5–8.1)

## 2019-12-01 LAB — CBC WITH DIFFERENTIAL/PLATELET
Abs Immature Granulocytes: 0.01 10*3/uL (ref 0.00–0.07)
Basophils Absolute: 0 10*3/uL (ref 0.0–0.1)
Basophils Relative: 0 %
Eosinophils Absolute: 0.1 10*3/uL (ref 0.0–0.5)
Eosinophils Relative: 1 %
HCT: 29.3 % — ABNORMAL LOW (ref 36.0–46.0)
Hemoglobin: 9 g/dL — ABNORMAL LOW (ref 12.0–15.0)
Immature Granulocytes: 0 %
Lymphocytes Relative: 20 %
Lymphs Abs: 1.4 10*3/uL (ref 0.7–4.0)
MCH: 27.4 pg (ref 26.0–34.0)
MCHC: 30.7 g/dL (ref 30.0–36.0)
MCV: 89.3 fL (ref 80.0–100.0)
Monocytes Absolute: 0.8 10*3/uL (ref 0.1–1.0)
Monocytes Relative: 11 %
Neutro Abs: 4.8 10*3/uL (ref 1.7–7.7)
Neutrophils Relative %: 68 %
Platelets: 288 10*3/uL (ref 150–400)
RBC: 3.28 MIL/uL — ABNORMAL LOW (ref 3.87–5.11)
RDW: 14.9 % (ref 11.5–15.5)
WBC: 7.1 10*3/uL (ref 4.0–10.5)
nRBC: 0 % (ref 0.0–0.2)

## 2019-12-01 LAB — VITAMIN B12: Vitamin B-12: 358 pg/mL (ref 180–914)

## 2019-12-01 LAB — RESP PANEL BY RT-PCR (FLU A&B, COVID) ARPGX2
Influenza A by PCR: NEGATIVE
Influenza B by PCR: NEGATIVE
SARS Coronavirus 2 by RT PCR: NEGATIVE

## 2019-12-01 LAB — IRON AND TIBC
Iron: 13 ug/dL — ABNORMAL LOW (ref 28–170)
Saturation Ratios: 3 % — ABNORMAL LOW (ref 10.4–31.8)
TIBC: 472 ug/dL — ABNORMAL HIGH (ref 250–450)
UIBC: 459 ug/dL

## 2019-12-01 LAB — TROPONIN I (HIGH SENSITIVITY)
Troponin I (High Sensitivity): 17 ng/L (ref <18)
Troponin I (High Sensitivity): 21 ng/L — ABNORMAL HIGH (ref <18)

## 2019-12-01 LAB — RETICULOCYTES
Immature Retic Fract: 24.4 % — ABNORMAL HIGH (ref 2.3–15.9)
RBC.: 3.31 MIL/uL — ABNORMAL LOW (ref 3.87–5.11)
Retic Count, Absolute: 63.2 10*3/uL (ref 19.0–186.0)
Retic Ct Pct: 1.9 % (ref 0.4–3.1)

## 2019-12-01 LAB — FERRITIN: Ferritin: 4 ng/mL — ABNORMAL LOW (ref 11–307)

## 2019-12-01 LAB — PROCALCITONIN: Procalcitonin: <0.1 ng/mL

## 2019-12-01 LAB — BRAIN NATRIURETIC PEPTIDE: B Natriuretic Peptide: 701 pg/mL — ABNORMAL HIGH (ref 0.0–100.0)

## 2019-12-01 LAB — MRSA PCR SCREENING: MRSA by PCR: NEGATIVE

## 2019-12-01 MED ORDER — FUROSEMIDE 10 MG/ML IJ SOLN
40.0000 mg | Freq: Once | INTRAMUSCULAR | Status: AC
  Administered 2019-12-01: 40 mg via INTRAVENOUS
  Filled 2019-12-01: qty 4

## 2019-12-01 MED ORDER — DOCUSATE SODIUM 100 MG PO CAPS
100.0000 mg | ORAL_CAPSULE | Freq: Every day | ORAL | Status: DC
  Administered 2019-12-01 – 2019-12-03 (×3): 100 mg via ORAL
  Filled 2019-12-01 (×3): qty 1

## 2019-12-01 MED ORDER — TRAMADOL HCL 50 MG PO TABS
50.0000 mg | ORAL_TABLET | Freq: Two times a day (BID) | ORAL | Status: DC
  Administered 2019-12-01 – 2019-12-03 (×4): 50 mg via ORAL
  Filled 2019-12-01 (×4): qty 1

## 2019-12-01 MED ORDER — LEVOTHYROXINE SODIUM 112 MCG PO TABS
112.0000 ug | ORAL_TABLET | Freq: Every day | ORAL | Status: DC
  Administered 2019-12-02 – 2019-12-03 (×2): 112 ug via ORAL
  Filled 2019-12-01 (×2): qty 1

## 2019-12-01 MED ORDER — FENTANYL CITRATE (PF) 100 MCG/2ML IJ SOLN
25.0000 ug | INTRAMUSCULAR | Status: DC | PRN
  Administered 2019-12-01: 25 ug via INTRAVENOUS
  Filled 2019-12-01: qty 2

## 2019-12-01 MED ORDER — MECLIZINE HCL 12.5 MG PO TABS: 25.0000 mg | ORAL_TABLET | Freq: Three times a day (TID) | ORAL | Status: DC | PRN

## 2019-12-01 MED ORDER — APIXABAN 5 MG PO TABS
5.0000 mg | ORAL_TABLET | Freq: Two times a day (BID) | ORAL | Status: DC
  Administered 2019-12-01 – 2019-12-03 (×5): 5 mg via ORAL
  Filled 2019-12-01 (×5): qty 1

## 2019-12-01 MED ORDER — DILTIAZEM LOAD VIA INFUSION
5.0000 mg | Freq: Once | INTRAVENOUS | Status: AC
  Administered 2019-12-01: 5 mg via INTRAVENOUS
  Filled 2019-12-01: qty 5

## 2019-12-01 MED ORDER — HYDROXYCHLOROQUINE SULFATE 200 MG PO TABS
200.0000 mg | ORAL_TABLET | Freq: Two times a day (BID) | ORAL | Status: DC
  Administered 2019-12-01 – 2019-12-03 (×5): 200 mg via ORAL
  Filled 2019-12-01 (×7): qty 1

## 2019-12-01 MED ORDER — IOHEXOL 350 MG/ML SOLN
100.0000 mL | Freq: Once | INTRAVENOUS | Status: AC | PRN
  Administered 2019-12-01: 100 mL via INTRAVENOUS

## 2019-12-01 MED ORDER — LORATADINE 10 MG PO TABS
10.0000 mg | ORAL_TABLET | Freq: Every day | ORAL | Status: DC
  Administered 2019-12-01 – 2019-12-03 (×3): 10 mg via ORAL
  Filled 2019-12-01 (×3): qty 1

## 2019-12-01 MED ORDER — SODIUM CHLORIDE 0.9% FLUSH: 3.0000 mL | INTRAVENOUS | Status: DC | PRN

## 2019-12-01 MED ORDER — ESCITALOPRAM OXALATE 10 MG PO TABS
10.0000 mg | ORAL_TABLET | Freq: Every day | ORAL | Status: DC
  Administered 2019-12-01 – 2019-12-03 (×3): 10 mg via ORAL
  Filled 2019-12-01 (×3): qty 1

## 2019-12-01 MED ORDER — DILTIAZEM HCL-DEXTROSE 125-5 MG/125ML-% IV SOLN (PREMIX)
5.0000 mg/h | INTRAVENOUS | Status: DC
  Administered 2019-12-01: 10 mg/h via INTRAVENOUS
  Administered 2019-12-01: 5 mg/h via INTRAVENOUS
  Administered 2019-12-02: 10 mg/h via INTRAVENOUS
  Filled 2019-12-01 (×3): qty 125

## 2019-12-01 MED ORDER — SODIUM CHLORIDE 0.9% FLUSH
3.0000 mL | Freq: Two times a day (BID) | INTRAVENOUS | Status: DC
  Administered 2019-12-01 – 2019-12-03 (×4): 3 mL via INTRAVENOUS

## 2019-12-01 MED ORDER — ONDANSETRON HCL 4 MG/2ML IJ SOLN
4.0000 mg | Freq: Once | INTRAMUSCULAR | Status: AC
  Administered 2019-12-01: 4 mg via INTRAVENOUS
  Filled 2019-12-01: qty 2

## 2019-12-01 MED ORDER — FUROSEMIDE 10 MG/ML IJ SOLN
40.0000 mg | Freq: Two times a day (BID) | INTRAMUSCULAR | Status: DC
  Administered 2019-12-01 – 2019-12-03 (×4): 40 mg via INTRAVENOUS
  Filled 2019-12-01 (×4): qty 4

## 2019-12-01 MED ORDER — SODIUM CHLORIDE 0.9 % IV SOLN: 250.0000 mL | INTRAVENOUS | Status: DC | PRN

## 2019-12-01 MED ORDER — CHLORHEXIDINE GLUCONATE CLOTH 2 % EX PADS
6.0000 | MEDICATED_PAD | Freq: Every day | CUTANEOUS | Status: DC
  Administered 2019-12-01 – 2019-12-03 (×3): 6 via TOPICAL

## 2019-12-01 MED ORDER — ACETAMINOPHEN 325 MG PO TABS
650.0000 mg | ORAL_TABLET | ORAL | Status: DC | PRN
  Administered 2019-12-02 – 2019-12-03 (×3): 650 mg via ORAL
  Filled 2019-12-01 (×3): qty 2

## 2019-12-01 MED ORDER — ONDANSETRON HCL 4 MG/2ML IJ SOLN
4.0000 mg | Freq: Four times a day (QID) | INTRAMUSCULAR | Status: DC | PRN
  Administered 2019-12-02: 4 mg via INTRAVENOUS
  Filled 2019-12-01: qty 2
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
## 2019-12-01 NOTE — H&P (Signed)
History and Physical    Erin Schmidt JHE:[id number] DOB: 04-20-1932 DOA: 12/01/2019  PCP: Sharilyn Sites, MD   Patient coming from: Home  Chief Complaint: Shortness of breath and lower extremity edema  HPI: Erin Schmidt is a 86 y.o. female with medical history significant for atrial fibrillation on Eliquis, hypertension, hypothyroidism, vertigo, and iron deficiency anemia who presented to the ED with worsening shortness of breath as well as lower extremity edema.  She was diagnosed with atrial fibrillation recently and was started on diltiazem and Eliquis for treatment.  She was taken off of her benazepril-HCTZ on 11/11 to allow further titration of her Cardizem and she began noticing some swelling in her feet about 3 to 4 days prior.  The swelling would go away with laying down at night, however this has persisted in the last 2 days.  She has also noted worsening shortness of breath when laying down at night and had to come into the ED today as she noted her heart racing at around 4 AM and she also has some nausea and diaphoresis.  She also describes some anterior chest soreness and pain between her shoulder blades in the last few days.  She has had 2D echocardiogram on 10/21 with LVEF 18-56% and no diastolic dysfunction noted.   ED Course: Vital signs currently stable and patient has been started on Cardizem drip as well as Lasix given for diuresis.  EKG showed some atrial fibrillation with RVR.  BNP is 701.  Troponin initially 17 with repeat 21.  EDP has discussed with cardiology Dr. Harrington Challenger who will see in consultation.  Imaging done with CT angiogram of chest abdomen and pelvis with no findings of dissection, but mild right hydroureteronephrosis noted as well as moderate bilateral effusions and some findings of CAD.  She also has a left thyroid nodule as well as mild amounts of fluid in her abdomen/pelvis.  Review of Systems: All others reviewed and otherwise negative except as noted  above.  Past Medical History:  Diagnosis Date  . Arthritis   . Atrial fibrillation ([place])   . Dysrhythmia   . GERD (gastroesophageal reflux disease)   . Hyperlipemia   . Hypertension    preventative measures  . Hypothyroidism   . Iron deficiency anemia 05/07/2019  . Migraine   . Thyroid disease   . Vertigo     Past Surgical History:  Procedure Laterality Date  . APPENDECTOMY    . BREAST BIOPSY Right    benign  . BREAST EXCISIONAL BIOPSY Left    Benign  . BREAST EXCISIONAL BIOPSY Left    Benign  . BREAST LUMPECTOMY  30 yrs ago   Dr. Milbert Coulter- rt breast  . BREAST LUMPECTOMY  15 yrs ago   left- Dr. Romona Curls  . BREAST LUMPECTOMY  01/2011  . CARDIOVERSION N/A 11/10/2019   Procedure: CARDIOVERSION;  Surgeon: Arnoldo Lenis, MD;  Location: [facility];  Service: Endoscopy;  Laterality: N/A;  . CHOLECYSTECTOMY    . COLONOSCOPY    . COLONOSCOPY  04/13/2011   Procedure: COLONOSCOPY;  Surgeon: Rogene Houston, MD;  Location: [facility];  Service: Endoscopy;  Laterality: N/A;  930  . MASTECTOMY, PARTIAL  01/13/2011   Procedure: MASTECTOMY PARTIAL;  Surgeon: Adin Hector, MD;  Location: [facility];  Service: General;  Laterality: Left;  left partial mastectomy with needle locallization  . VAGINAL HYSTERECTOMY     2 partials     reports that she quit smoking about  30 years ago. Her smoking use included cigarettes. She has a 20.00 pack-year smoking history. She has never used smokeless tobacco. She reports that she does not drink alcohol and does not use drugs.  Allergies  Allergen Reactions  . Feraheme [Ferumoxytol]     Reacted during infusion requiring medical intervention. Increased BP    Family History  Problem Relation Age of Onset  . Heart disease Mother   . Stroke Sister   . Heart disease Brother   . Atrial fibrillation Sister   . Coronary artery disease Brother   . Lung cancer Son   . Healthy Son   . Colon cancer Neg Hx     Prior to Admission  medications   Medication Sig Start Date End Date Taking? Authorizing Provider  apixaban (ELIQUIS) 5 MG TABS tablet Take 1 tablet (5 mg total) by mouth 2 (two) times daily. 10/28/19   Arnoldo Lenis, MD  Calcium Carbonate-Vitamin D (CALCIUM + D PO) Take 1 tablet by mouth 2 (two) times daily.     [provider]  cetirizine (ZYRTEC) 10 MG tablet Take 10 mg by mouth daily.    [provider]  Cholecalciferol (VITAMIN D3 PO) Take by mouth.    [provider]  diltiazem (CARDIZEM CD) 180 MG 24 hr capsule Take 1 capsule (180 mg total) by mouth daily. 11/20/19   Strader, Fransisco Hertz, PA-C  escitalopram (LEXAPRO) 10 MG tablet Take 10 mg by mouth daily.    [provider]  etanercept (ENBREL SURECLICK) 50 MG/ML injection Inject 50 mg into the skin once a week. 11/18/19   Ofilia Neas, PA-C  hydroxychloroquine (PLAQUENIL) 200 MG tablet TAKE 1 TABLET BY MOUTH TWICE A DAY Patient taking differently: Take 200 mg by mouth 2 (two) times daily.  09/22/19   Ofilia Neas, PA-C  levothyroxine (SYNTHROID, LEVOTHROID) 112 MCG tablet Take 112 mcg by mouth daily before breakfast.  05/02/16   [provider]  meclizine (ANTIVERT) 25 MG tablet Take 25 mg by mouth 3 (three) times daily as needed for dizziness.     [provider]  traMADol-acetaminophen (ULTRACET) 37.5-325 MG tablet Take 1 tablet by mouth in the morning and at bedtime.     [provider]    Physical Exam: Vitals:   12/01/19 0845 12/01/19 0900 12/01/19 1000 12/01/19 1030  BP:  114/63 110/80 123/71  Pulse: (!) 105 (!) 109 95 87  Resp: 14 18 17  (!) 23  SpO2: 94% 94% 93% (!) 89%  Weight:      Height:        Constitutional: NAD, calm, comfortable Vitals:   12/01/19 0845 12/01/19 0900 12/01/19 1000 12/01/19 1030  BP:  114/63 110/80 123/71  Pulse: (!) 105 (!) 109 95 87  Resp: 14 18 17  (!) 23  SpO2: 94% 94% 93% (!) 89%  Weight:      Height:       Eyes: lids and conjunctivae  normal ENMT: Mucous membranes are moist.  Neck: normal, supple Respiratory: clear to auscultation bilaterally. Normal respiratory effort. No accessory muscle use. On RA. Cardiovascular: Irregular rate and rhythm, tachycardic, no murmurs.  Abdomen: no tenderness, no distention. Bowel sounds positive.  Musculoskeletal:  No significant edema Skin: no rashes, lesions, ulcers.  Psychiatric: Normal judgment and insight. Alert and oriented x 3. Normal mood.   Labs on Admission: I have personally reviewed following labs and imaging studies  CBC: Recent Labs  Lab 12/01/19 0613  WBC 7.1  NEUTROABS  4.8  HGB 9.0*  HCT 29.3*  MCV 89.3  PLT 694   Basic Metabolic Panel: Recent Labs  Lab 12/01/19 0613  NA 135  K 3.6  CL 104  CO2 21*  GLUCOSE 122*  BUN 20  CREATININE 0.98  CALCIUM 8.7*   GFR: Estimated Creatinine Clearance: 40.1 mL/min (by C-G formula based on SCr of 0.98 mg/dL). Liver Function Tests: Recent Labs  Lab 12/01/19 0613  AST 25  ALT 20  ALKPHOS 56  BILITOT 0.5  PROT 6.6  ALBUMIN 3.8   No results for input(s): LIPASE, AMYLASE in the last 168 hours. No results for input(s): AMMONIA in the last 168 hours. Coagulation Profile: No results for input(s): INR, PROTIME in the last 168 hours. Cardiac Enzymes: No results for input(s): CKTOTAL, CKMB, CKMBINDEX, TROPONINI in the last 168 hours. BNP (last 3 results) No results for input(s): PROBNP in the last 8760 hours. HbA1C: No results for input(s): HGBA1C in the last 72 hours. CBG: No results for input(s): GLUCAP in the last 168 hours. Lipid Profile: No results for input(s): CHOL, HDL, LDLCALC, TRIG, CHOLHDL, LDLDIRECT in the last 72 hours. Thyroid Function Tests: No results for input(s): TSH, T4TOTAL, FREET4, T3FREE, THYROIDAB in the last 72 hours. Anemia Panel: No results for input(s): VITAMINB12, FOLATE, FERRITIN, TIBC, IRON, RETICCTPCT in the last 72 hours. Urine analysis: No results found for: COLORURINE,  APPEARANCEUR, LABSPEC, [place], GLUCOSEU, HGBUR, BILIRUBINUR, [place], PROTEINUR, UROBILINOGEN, NITRITE, LEUKOCYTESUR  Radiological Exams on Admission: DG Chest Port 1 View  Result Date: 12/01/2019 CLINICAL DATA:  Shortness of breath for 3 nights. Pain under the diaphragms. Atrial fibrillation and foot swelling. EXAM: PORTABLE CHEST 1 VIEW COMPARISON:  09/25/2019 FINDINGS: Mild cardiac enlargement. Interstitial pattern to the lungs may represent interstitial edema. Early multifocal interstitial pneumonia could also have this appearance. No pleural effusions. No pneumothorax. Mediastinal contours appear intact. Calcification of the aorta. IMPRESSION: Interstitial pattern to the lungs may represent interstitial edema. Early multifocal interstitial pneumonia could also have this appearance. Electronically Signed   By: Lucienne Capers M.D.   On: 12/01/2019 06:35   CT Angio Chest/Abd/Pel for Dissection W and/or W/WO  Result Date: 12/01/2019 CLINICAL DATA:  Shortness of breath, chest and back pain. EXAM: CT ANGIOGRAPHY CHEST, ABDOMEN AND PELVIS TECHNIQUE: Non-contrast CT of the chest was initially obtained. Multidetector CT imaging through the chest, abdomen and pelvis was performed using the standard protocol during bolus administration of intravenous contrast. Multiplanar reconstructed images and MIPs were obtained and reviewed to evaluate the vascular anatomy. CONTRAST:  152mL OMNIPAQUE IOHEXOL 350 MG/ML SOLN COMPARISON:  October 08, 2012. FINDINGS: CTA CHEST FINDINGS Cardiovascular: Atherosclerosis of thoracic aorta is noted without aneurysm or dissection. Mild cardiomegaly is noted. No pericardial effusion is noted. Mild coronary artery calcifications are noted. Mediastinum/Nodes: 1.6 cm left thyroid nodule is noted. Esophagus is unremarkable. No adenopathy is noted. Lungs/Pleura: No pneumothorax is noted. Moderate bilateral pleural effusions are noted, right greater than left, with minimal bibasilar  subsegmental atelectasis or edema. Musculoskeletal: No chest wall abnormality. No acute or significant osseous findings. Review of the MIP images confirms the above findings. CTA ABDOMEN AND PELVIS FINDINGS VASCULAR Aorta: Atherosclerosis of abdominal aorta is noted without aneurysm or dissection. Celiac: Patent without evidence of aneurysm, dissection, vasculitis or significant stenosis. SMA: Patent without evidence of aneurysm, dissection, vasculitis or significant stenosis. Renals: Both renal arteries are patent without evidence of aneurysm, dissection, vasculitis, fibromuscular dysplasia or significant stenosis. IMA: Patent without evidence of aneurysm, dissection, vasculitis or significant stenosis.  Inflow: Patent without evidence of aneurysm, dissection, vasculitis or significant stenosis. Veins: No obvious venous abnormality within the limitations of this arterial phase study. Review of the MIP images confirms the above findings. NON-VASCULAR Hepatobiliary: No focal liver abnormality is seen. Status post cholecystectomy. No biliary dilatation. Pancreas: Unremarkable. No pancreatic ductal dilatation or surrounding inflammatory changes. Spleen: Normal in size without focal abnormality. Adrenals/Urinary Tract: Adrenal glands appear normal. Left renal cyst is noted. Partially duplicated right ureteral system is noted. Mild right hydroureteronephrosis is noted without obstructing calculus. Mild urinary bladder distention is noted. Stomach/Bowel: The stomach appears normal. There is no evidence of bowel obstruction or inflammation. Sigmoid diverticulosis is noted without inflammation. The appendix is not clearly visualized. Lymphatic: No significant adenopathy is noted. Reproductive: Status post hysterectomy. No adnexal masses. Other: Mild amount of free fluid is noted in the pelvis and around the liver. No significant hernia is noted. Musculoskeletal: No acute or significant osseous findings. Review of the MIP  images confirms the above findings. IMPRESSION: 1. There is no evidence of thoracic or abdominal aortic dissection or aneurysm. 2. Mild coronary artery calcifications are noted suggesting coronary artery disease. 3. Moderate bilateral pleural effusions are noted, right greater than left, with minimal bibasilar subsegmental atelectasis or edema. 4. 1.6 cm left thyroid nodule is noted. Recommend thyroid US. (Ref: J Am Coll Radiol. 2015 Feb;12(2): 143-50). 5. Mild right hydroureteronephrosis is noted without obstructing calculus. Mild urinary bladder distention is noted. 6. Sigmoid diverticulosis without inflammation. 7. Mild amount of free fluid is noted in the pelvis and around the liver. 8. Aortic atherosclerosis. Aortic Atherosclerosis (ICD10-I70.0). Electronically Signed   By: Marijo Conception M.D.   On: 12/01/2019 08:55    EKG: Independently reviewed. Afib with RVR 123bpm.  Assessment/Plan Active Problems:   Atrial fibrillation with RVR ([facility])    Acute CHF exacerbation secondary to atrial fibrillation with RVR -Cardizem drip initiated for heart rate control -Continue Eliquis for anticoagulation -Recent 2D echocardiogram noted on 10/21 with LVEF 42-70% and no diastolic dysfunction at that time -Continue on Lasix 40 mg IV twice daily, monitor daily weights, strict I's and O's -Fluid restriction  Mild right hydroureteronephrosis -No significant obstruction noted at this time and this may be due to abdominal fluid present -Monitor repeat labs and plan to call urology if worsening creatinine level noted -Monitor input and output  History of hypertension-currently controlled -Continue to monitor closely on IV Lasix twice daily -Cardizem drip  History of hypothyroidism with thyroid nodule -Check TSH and T4 -Continue home levothyroxine -Check thyroid ultrasound  History of iron deficiency anemia -Continue to follow CBC -Check anemia panel -Stool softner   DVT prophylaxis: Eliquis Code  Status: DNI Family Communication: Husband at bedside Disposition Plan:Admit for afib/rvr and CHF treatment Consults called:Cardiology Admission status: Inpatient, SDU   Charnita Trudel D Adorian Gwynne DO [facility]  If 7PM-7AM, please contact night-coverage [email or web address]  12/01/2019, 10:46 AM
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
## 2019-12-01 NOTE — ED Notes (Signed)
Patient remains off unit at this time at CT.

## 2019-12-01 NOTE — Progress Notes (Signed)
Pt desat's when sleeping to the mid 80's, (ex: 85-86%). 2L oxygen via nasal cannula applied. Pt tolerated well, will continue to monitor.

## 2019-12-01 NOTE — ED Provider Notes (Signed)
86 yo female with recent history of new onset a fbi, cardioverted but now known to be back in  A fib..  With new bilateral lower extremity swelling. Increased cardizem but stopped hctz/enazopril November 11. Now she has bilateral lower extremity pitting edema and dyspnea.  Heart racing at 0400.  Now has pain shoulder blades -she received fentanyl  Received lasix 40 mg iv with good uop here.  Physical Exam  BP 118/88 (BP Location: Right Arm)   Pulse (!) 117   Resp 20   Ht 1.689 m (5' 6.5")   Wt 67.6 kg   SpO2 96%   BMI 23.69 kg/m   Physical Exam  ED Course/Procedures     Procedures CTA pending.   MDM  Plan cardiology consult   CTA - no evidence of pe or dissection Bilateral effusions noted Plan consult to cardiology and hospitalist for admission New onset chf likely due to a fib rvr Patient being diuresed  Discussed with Dr. Harrington Challenger- cardiology will see in consult Discussed with Dr. Shiela Mayer will admit Patient with good uop, feels improved. Sats still low 90s Covid vaccine completed      Pattricia Boss, MD 12/01/19 1043

## 2019-12-01 NOTE — Consult Note (Signed)
Cardiology Consult    Patient ID: ABEEHA TWIST; [id number]; 1932/10/13   Admit date: 12/01/2019 Date of Consult: 12/01/2019  Primary Care Provider: Sharilyn Sites, MD Primary Cardiologist: Carlyle Dolly, MD   Patient Profile    Erin Schmidt is a 86 y.o. female with past medical history of persistent atrial fibrillation, HTN, HLD, hypothyroidism and iron deficiency anemia who is being seen today for the evaluation of atrial fibrillation with RVR and CHF at the request of Dr. Manuella Ghazi.   History of Present Illness    Erin Schmidt was diagnosed with atrial fibrillation in 09/2019 and met with Dr. Harl Bowie in 10/10/2019 following her recent diagnosis to discuss management options. Given her new diagnosis, a DCCV was recommended following 3 weeks of anticoagulation. She did have an echo in the interim which showed her EF was low-normal at 50-55% and she did have severe LA dilation and moderate MR. She ultimately underwent DCCV on 11/10/2019 and converted back to NSR. She did follow-up in the office on 11/20/2019 and was back in atrial fibrillation. Reported she had been symptomatic with her atrial fibrillation and did not notice any changes in her breathing or respiratory status when in NSR versus atrial fibrillation. Reported her HR had started fluctuating from the 50's to low-100's within a week of the procedure. Findings were reviewed with Dr. Harl Bowie and it was felt rate-control could be pursued at that time. Short-acting Cardizem was transitioned to Cardizem CD 111m daily and Benazepril-HCTZ 10-6.25mg daily was discontinued given her soft BP of 108/56 and to allow for further titration of Cardizem if needed in the future.   The pt says that since last Thursday she developed some ankle swelling   Got a little worse over the weekend.   Breathing was bad Saturday.  She had to get up and couldn't lay down Sunday night took another tramadol to sleep  Woke up this am could not get breath unless  bent forward.  Nauseated  Some epigastric discomfort   Alos some pain between shoulder blades   Pt says her wt went up 10 lbs (!) since she was last in clinc    [facility]   Initial labs show WBC 7.1, Hgb 9.0, platelets 288, Na+ 135, K+ 3.6 and creatinine 0.98. BNP 701. Initial HS Troponin 17 with repeat of 21. CXR consistent with interstitial edema. CTA negative for thoracic or abdominal aortic dissection. Noted to have coronary calcifications, moderate pleural effusion, mild right hydronephrosis and a 1.6 cm thyroid nodule with thyroid UKorearecommended. EKG showed atrial fibrillation with RVR, HR 123 with known RBBB.   She has been started on IV Cardizem for rate-control along with being continued on Eliquis 569mBID. Received IV Lasix 405mhile in the ED and scheduled to start IV Lasix 72m57mD.   Currently she is feeling  MUCH better   No pain  Breathing better   Took a 2-3 hour nap  Has put out 2.5 L and alos used the bathroom twice.  Past Medical History:  Diagnosis Date  . Arthritis   . Atrial fibrillation (HCC)[place]. Dysrhythmia   . GERD (gastroesophageal reflux disease)   . Hyperlipemia   . Hypertension    preventative measures  . Hypothyroidism   . Iron deficiency anemia 05/07/2019  . Migraine   . Thyroid disease   . Vertigo     Past Surgical History:  Procedure  Laterality Date  . APPENDECTOMY    . BREAST BIOPSY Right    benign  . BREAST EXCISIONAL BIOPSY Left    Benign  . BREAST EXCISIONAL BIOPSY Left    Benign  . BREAST LUMPECTOMY  30 yrs ago   Dr. Milbert Coulter- rt breast  . BREAST LUMPECTOMY  15 yrs ago   left- Dr. Romona Curls  . BREAST LUMPECTOMY  01/2011  . CARDIOVERSION N/A 11/10/2019   Procedure: CARDIOVERSION;  Surgeon: Arnoldo Lenis, MD;  Location: [facility];  Service: Endoscopy;  Laterality: N/A;  . CHOLECYSTECTOMY    . COLONOSCOPY    . COLONOSCOPY  04/13/2011   Procedure: COLONOSCOPY;   Surgeon: Rogene Houston, MD;  Location: [facility];  Service: Endoscopy;  Laterality: N/A;  930  . MASTECTOMY, PARTIAL  01/13/2011   Procedure: MASTECTOMY PARTIAL;  Surgeon: Adin Hector, MD;  Location: [place];  Service: General;  Laterality: Left;  left partial mastectomy with needle locallization  . VAGINAL HYSTERECTOMY     2 partials     Home Medications:  Prior to Admission medications   Medication Sig Start Date End Date Taking? Authorizing Provider  apixaban (ELIQUIS) 5 MG TABS tablet Take 1 tablet (5 mg total) by mouth 2 (two) times daily. 10/28/19   Arnoldo Lenis, MD  Calcium Carbonate-Vitamin D (CALCIUM + D PO) Take 1 tablet by mouth 2 (two) times daily.     [provider]  cetirizine (ZYRTEC) 10 MG tablet Take 10 mg by mouth daily.    [provider]  Cholecalciferol (VITAMIN D3 PO) Take by mouth.    [provider]  diltiazem (CARDIZEM CD) 180 MG 24 hr capsule Take 1 capsule (180 mg total) by mouth daily. 11/20/19   Strader, Fransisco Hertz, PA-C  escitalopram (LEXAPRO) 10 MG tablet Take 10 mg by mouth daily.    [provider]  etanercept (ENBREL SURECLICK) 50 MG/ML injection Inject 50 mg into the skin once a week. 11/18/19   Ofilia Neas, PA-C  hydroxychloroquine (PLAQUENIL) 200 MG tablet TAKE 1 TABLET BY MOUTH TWICE A DAY Patient taking differently: Take 200 mg by mouth 2 (two) times daily.  09/22/19   Ofilia Neas, PA-C  levothyroxine (SYNTHROID, LEVOTHROID) 112 MCG tablet Take 112 mcg by mouth daily before breakfast.  05/02/16   [provider]  meclizine (ANTIVERT) 25 MG tablet Take 25 mg by mouth 3 (three) times daily as needed for dizziness.     [provider]  traMADol-acetaminophen (ULTRACET) 37.5-325 MG tablet Take 1 tablet by mouth in the morning and at bedtime.     [provider]    Inpatient Medications: Scheduled Meds: . apixaban  5 mg Oral BID  . docusate sodium  100 mg  Oral Daily  . escitalopram  10 mg Oral Daily  . furosemide  40 mg Intravenous Q12H  . hydroxychloroquine  200 mg Oral BID  . [START ON 12/02/2019] levothyroxine  112 mcg Oral QAC breakfast  . loratadine  10 mg Oral Daily  . sodium chloride flush  3 mL Intravenous Q12H   Continuous Infusions: . sodium chloride    . diltiazem (CARDIZEM) infusion 10 mg/hr (12/01/19 0720)   PRN Meds: sodium chloride, acetaminophen, meclizine, ondansetron (ZOFRAN) IV, sodium chloride flush  Allergies:    Allergies  Allergen Reactions  . Feraheme [Ferumoxytol]     Reacted during infusion requiring medical intervention. Increased BP    Social History:   Social History  Socioeconomic History  . Marital status: Married    Spouse name: Not on file  . Number of children: 2  . Years of education: Not on file  . Highest education level: Not on file  Occupational History  . Occupation: RETIRED  Tobacco Use  . Smoking status: Former Smoker    Packs/day: 1.00    Years: 20.00    Pack years: 20.00    Types: Cigarettes    Quit date: 01/05/1989    Years since quitting: 30.9  . Smokeless tobacco: Never Used  Vaping Use  . Vaping Use: Never used  Substance and Sexual Activity  . Alcohol use: No  . Drug use: Never  . Sexual activity: Not on file  Other Topics Concern  . Not on file  Social History Narrative  . Not on file   Social Determinants of Health   Financial Resource Strain:   . Difficulty of Paying Living Expenses: Not on file  Food Insecurity:   . Worried About Charity fundraiser in the Last Year: Not on file  . Ran Out of Food in the Last Year: Not on file  Transportation Needs:   . Lack of Transportation (Medical): Not on file  . Lack of Transportation (Non-Medical): Not on file  Physical Activity:   . Days of Exercise per Week: Not on file  . Minutes of Exercise per Session: Not on file  Stress:   . Feeling of Stress : Not on file  Social Connections:   . Frequency of  Communication with Friends and Family: Not on file  . Frequency of Social Gatherings with Friends and Family: Not on file  . Attends Religious Services: Not on file  . Active Member of Clubs or Organizations: Not on file  . Attends Archivist Meetings: Not on file  . Marital Status: Not on file  Intimate Partner Violence:   . Fear of Current or Ex-Partner: Not on file  . Emotionally Abused: Not on file  . Physically Abused: Not on file  . Sexually Abused: Not on file     Family History:    Family History  Problem Relation Age of Onset  . Heart disease Mother   . Stroke Sister   . Heart disease Brother   . Atrial fibrillation Sister   . Coronary artery disease Brother   . Lung cancer Son   . Healthy Son   . Colon cancer Neg Hx       Review of Systems    General:  No chills, fever, night sweats or weight changes.  Cardiovascular:  No chest pain, palpitations, paroxysmal nocturnal dyspnea. Positive for dyspnea, edema and orthopnea.  Dermatological: No rash, lesions/masses Respiratory: No cough, dyspnea Urologic: No hematuria, dysuria Abdominal:   No nausea, vomiting, diarrhea, bright red blood per rectum, melena, or hematemesis Neurologic:  No visual changes, wkns, changes in mental status. All other systems reviewed and are otherwise negative except as noted above.  Physical Exam/Data    Vitals:   12/01/19 1000 12/01/19 1030 12/01/19 1100 12/01/19 1130  BP: 110/80 123/71 128/77 124/77  Pulse: 95 87 79 95  Resp: 17 (!) _0 SpO2: 93% (!) 89% 91% 94%  Weight:      Height:        Intake/Output Summary (Last 24 hours) at 12/01/2019 1150 Last data filed at 12/01/2019 0753 Gross per 24 hour  Intake --  Output 1000 ml  Net -1000 ml   [employer]  12/01/19 0542  Weight: 67.6 kg   Body mass index is 23.69 kg/m.   General: Pleasant female appearing in NAD Psych: Normal affect. Neuro: Alert and oriented X 3. Moves all extremities  spontaneously. HEENT: Normal  Neck: Supple without bruits.  JVP is increased   Lungs:   Relatively clear   Heart: Irregularly irregular, no s3, s4, or murmurs. Abdomen: Soft  Mild RUQ tenderness and epigastric tenderness; BS + x 4.  Extremities: No clubbing, cyanosis or edema. DP/PT/Radials 2+ and equal bilaterally.   EKG:  The EKG was personally reviewed and demonstrates: Atrial fibrillation with RVR, HR 123 with known RBBB.   Telemetry:  Telemetry was personally reviewed and demonstrates:  Afib 80s    Labs/Studies     Relevant CV Studies:  Echocardiogram: 10/2019 IMPRESSIONS    1. Left ventricular ejection fraction, by estimation, is 50 to 55%. The  left ventricle has normal function. The left ventricle has no regional  wall motion abnormalities. Left ventricular diastolic parameters are  indeterminate.  2. Right ventricular systolic function is normal. The right ventricular  size is normal. There is mildly elevated pulmonary artery systolic  pressure.  3. Left atrial size was severely dilated.  4. Right atrial size was moderately dilated.  5. The mitral valve is normal in structure. Moderate mitral valve  regurgitation. No evidence of mitral stenosis.  6. Tricuspid valve regurgitation is mild to moderate.  7. The aortic valve is tricuspid. Aortic valve regurgitation is not  visualized. No aortic stenosis is present.  8. The inferior vena cava is dilated in size with <50% respiratory  variability, suggesting right atrial pressure of 15 mmHg.   Laboratory Data:  Chemistry Recent Labs  Lab 12/01/19 0613  NA 135  K 3.6  CL 104  CO2 21*  GLUCOSE 122*  BUN 20  CREATININE 0.98  CALCIUM 8.7*  GFRNONAA 57*  ANIONGAP 10    Recent Labs  Lab 12/01/19 0613  PROT 6.6  ALBUMIN 3.8  AST 25  ALT 20  ALKPHOS 56  BILITOT 0.5   Hematology Recent Labs  Lab 12/01/19 0613 12/01/19 1130  WBC 7.1  --   RBC 3.28* 3.31*  HGB 9.0*  --   HCT 29.3*  --   MCV  89.3  --   MCH 27.4  --   MCHC 30.7  --   RDW 14.9  --   PLT 288  --    Cardiac EnzymesNo results for input(s): TROPONINI in the last 168 hours. No results for input(s): TROPIPOC in the last 168 hours.  BNP Recent Labs  Lab 12/01/19 0613  BNP 701.0*    DDimer No results for input(s): DDIMER in the last 168 hours.  Radiology/Studies:  DG Chest Port 1 View  Result Date: 12/01/2019 CLINICAL DATA:  Shortness of breath for 3 nights. Pain under the diaphragms. Atrial fibrillation and foot swelling. EXAM: PORTABLE CHEST 1 VIEW COMPARISON:  09/25/2019 FINDINGS: Mild cardiac enlargement. Interstitial pattern to the lungs may represent interstitial edema. Early multifocal interstitial pneumonia could also have this appearance. No pleural effusions. No pneumothorax. Mediastinal contours appear intact. Calcification of the aorta. IMPRESSION: Interstitial pattern to the lungs may represent interstitial edema. Early multifocal interstitial pneumonia could also have this appearance. Electronically Signed   By: Lucienne Capers M.D.   On: 12/01/2019 06:35   CT Angio Chest/Abd/Pel for Dissection W and/or W/WO  Result Date: 12/01/2019 CLINICAL DATA:  Shortness of breath, chest and back pain. EXAM: CT ANGIOGRAPHY CHEST, ABDOMEN  AND PELVIS TECHNIQUE: Non-contrast CT of the chest was initially obtained. Multidetector CT imaging through the chest, abdomen and pelvis was performed using the standard protocol during bolus administration of intravenous contrast. Multiplanar reconstructed images and MIPs were obtained and reviewed to evaluate the vascular anatomy. CONTRAST:  198m OMNIPAQUE IOHEXOL 350 MG/ML SOLN COMPARISON:  October 08, 2012. FINDINGS: CTA CHEST FINDINGS Cardiovascular: Atherosclerosis of thoracic aorta is noted without aneurysm or dissection. Mild cardiomegaly is noted. No pericardial effusion is noted. Mild coronary artery calcifications are noted. Mediastinum/Nodes: 1.6 cm left thyroid nodule is  noted. Esophagus is unremarkable. No adenopathy is noted. Lungs/Pleura: No pneumothorax is noted. Moderate bilateral pleural effusions are noted, right greater than left, with minimal bibasilar subsegmental atelectasis or edema. Musculoskeletal: No chest wall abnormality. No acute or significant osseous findings. Review of the MIP images confirms the above findings. CTA ABDOMEN AND PELVIS FINDINGS VASCULAR Aorta: Atherosclerosis of abdominal aorta is noted without aneurysm or dissection. Celiac: Patent without evidence of aneurysm, dissection, vasculitis or significant stenosis. SMA: Patent without evidence of aneurysm, dissection, vasculitis or significant stenosis. Renals: Both renal arteries are patent without evidence of aneurysm, dissection, vasculitis, fibromuscular dysplasia or significant stenosis. IMA: Patent without evidence of aneurysm, dissection, vasculitis or significant stenosis. Inflow: Patent without evidence of aneurysm, dissection, vasculitis or significant stenosis. Veins: No obvious venous abnormality within the limitations of this arterial phase study. Review of the MIP images confirms the above findings. NON-VASCULAR Hepatobiliary: No focal liver abnormality is seen. Status post cholecystectomy. No biliary dilatation. Pancreas: Unremarkable. No pancreatic ductal dilatation or surrounding inflammatory changes. Spleen: Normal in size without focal abnormality. Adrenals/Urinary Tract: Adrenal glands appear normal. Left renal cyst is noted. Partially duplicated right ureteral system is noted. Mild right hydroureteronephrosis is noted without obstructing calculus. Mild urinary bladder distention is noted. Stomach/Bowel: The stomach appears normal. There is no evidence of bowel obstruction or inflammation. Sigmoid diverticulosis is noted without inflammation. The appendix is not clearly visualized. Lymphatic: No significant adenopathy is noted. Reproductive: Status post hysterectomy. No adnexal  masses. Other: Mild amount of free fluid is noted in the pelvis and around the liver. No significant hernia is noted. Musculoskeletal: No acute or significant osseous findings. Review of the MIP images confirms the above findings. IMPRESSION: 1. There is no evidence of thoracic or abdominal aortic dissection or aneurysm. 2. Mild coronary artery calcifications are noted suggesting coronary artery disease. 3. Moderate bilateral pleural effusions are noted, right greater than left, with minimal bibasilar subsegmental atelectasis or edema. 4. 1.6 cm left thyroid nodule is noted. Recommend thyroid [place] (Ref: J Am Coll Radiol. 2015 Feb;12(2): 143-50). 5. Mild right hydroureteronephrosis is noted without obstructing calculus. Mild urinary bladder distention is noted. 6. Sigmoid diverticulosis without inflammation. 7. Mild amount of free fluid is noted in the pelvis and around the liver. 8. Aortic atherosclerosis. Aortic Atherosclerosis (ICD10-I70.0). Electronically Signed   By: JMarijo ConceptionM.D.   On: 12/01/2019 08:55     Assessment & Plan    1  Afib with RVR   Pt presents with afib with RVR   She does not sense palpitaitons    Note she converted to SR earlier this fall (after DCCV) but went back into afib   She said she did not notice a difference In how she felt before or immed after cardioversion  She felt good   Was active     When she went to clinic she was feeling good   Then meds adjusted  She went to [facility]  It seems that this small changed affected balance of fluid   I am not impressed that there is another cause for decompensatoin  She is now feeling much better with diuresis   Reasess in AM  Consider 1.  After diuresis add back  Benazepril/HCTZ  5/6.25   Then use dilt CD (? 120 vs 180) or consider switch to b blocker   Team will reassess in AM.  2  CHF  Pt with signif wt gain since clinic visit and severe SOB   Breathing much better with diuresis today  Still not at baseline but very  comfortable  Chest presssure / back pressure gone.    I would continue IV diuresis for right now   Reassess in am See above  3  Hx HTN  Follow with changes   4  Hypothryoidism  COntinue Synthroid    For questions or updates, please contact [place] Please consult [email or web address] for contact info under Cardiology/STEMI.

## 2019-12-01 NOTE — ED Triage Notes (Signed)
[facility] - pt c/o SOB x 3 nights and pain in under her diaphragm that radiates to her back. EMS states that she is in Afib with RVR on the monitor.

## 2019-12-02 DIAGNOSIS — I4891 Unspecified atrial fibrillation: Secondary | ICD-10-CM | POA: Diagnosis not present

## 2019-12-02 LAB — BASIC METABOLIC PANEL
Anion gap: 8 (ref 5–15)
BUN: 18 mg/dL (ref 8–23)
CO2: 28 mmol/L (ref 22–32)
Calcium: 8 mg/dL — ABNORMAL LOW (ref 8.9–10.3)
Chloride: 100 mmol/L (ref 98–111)
Creatinine, Ser: 0.99 mg/dL (ref 0.44–1.00)
GFR, Estimated: 56 mL/min — ABNORMAL LOW (ref >60)
Glucose, Bld: 100 mg/dL — ABNORMAL HIGH (ref 70–99)
Potassium: 3 mmol/L — ABNORMAL LOW (ref 3.5–5.1)
Sodium: 136 mmol/L (ref 135–145)

## 2019-12-02 LAB — T4, FREE: Free T4: 1.34 ng/dL — ABNORMAL HIGH (ref 0.61–1.12)

## 2019-12-02 LAB — TSH: TSH: 3.486 u[IU]/mL (ref 0.350–4.500)

## 2019-12-02 MED ORDER — DILTIAZEM HCL 60 MG PO TABS
60.0000 mg | ORAL_TABLET | Freq: Three times a day (TID) | ORAL | Status: DC
  Administered 2019-12-02 – 2019-12-03 (×3): 60 mg via ORAL
  Filled 2019-12-02 (×4): qty 1

## 2019-12-02 MED ORDER — POTASSIUM CHLORIDE CRYS ER 20 MEQ PO TBCR
40.0000 meq | EXTENDED_RELEASE_TABLET | Freq: Two times a day (BID) | ORAL | Status: AC
  Administered 2019-12-02 (×2): 40 meq via ORAL
  Filled 2019-12-02: qty 2
  Filled 2019-12-02: qty 4

## 2019-12-02 NOTE — Progress Notes (Signed)
PROGRESS NOTE    Erin Schmidt  YKD:[id number] DOB: April 06, 1932 DOA: 12/01/2019 PCP: Sharilyn Sites, MD   Brief Narrative:  Per HPI: Erin Schmidt is a 86 y.o. female with medical history significant for atrial fibrillation on Eliquis, hypertension, hypothyroidism, vertigo, and iron deficiency anemia who presented to the ED with worsening shortness of breath as well as lower extremity edema.  She was diagnosed with atrial fibrillation recently and was started on diltiazem and Eliquis for treatment.  She was taken off of her benazepril-HCTZ on 11/11 to allow further titration of her Cardizem and she began noticing some swelling in her feet about 3 to 4 days prior.  The swelling would go away with laying down at night, however this has persisted in the last 2 days.  She has also noted worsening shortness of breath when laying down at night and had to come into the ED today as she noted her heart racing at around 4 AM and she also has some nausea and diaphoresis.  She also describes some anterior chest soreness and pain between her shoulder blades in the last few days.  She has had 2D echocardiogram on 10/21 with LVEF 39-76% and no diastolic dysfunction noted.  -Patient has been admitted with atrial fibrillation with RVR along with an acute on chronic diastolic CHF exacerbation.  She will transition from IV Cardizem to oral Cardizem on 11/23 and will continue IV Lasix for ongoing diuresis which appears to be helping her.  Okay for transfer to telemetry as needed.  Assessment & Plan:   Active Problems:   Atrial fibrillation with RVR (HCC)   Acute on chronic diastolic CHF exacerbation  -Continue on Lasix 40 mg IV twice daily, monitor daily weights, strict I's and O's -Fluid restriction -Likely change to p.o. diuretic by tomorrow  Atrial fibrillation with RVR -Cardizem drip now changed to oral dosing on 11/23 -Continue Eliquis for anticoagulation -Recent 2D echocardiogram noted on 10/21  with LVEF 73-41% and no diastolic dysfunction at that time  NSVT -8 beats of NSVT noted this morning -Replace potassium and check magnesium in a.m.  Hypokalemia -In setting of Lasix use -Repletion ordered -Recheck in a.m. along with magnesium  Mild right hydroureteronephrosis -No significant obstruction noted at this time and this may be due to abdominal fluid present -Monitor repeat labs and plan to call urology if worsening creatinine level noted -Monitor input and output  History of hypertension-currently controlled -Continue to monitor closely on IV Lasix twice daily -Oral Cardizem  History of hypothyroidism with thyroid nodule -Check TSH and T4 pending -Continue home levothyroxine -Thyroid ultrasound and biopsy recently performed and she is followed by endocrinology  History of iron deficiency anemia -Anemia panel with iron deficiency noted, however patient appears allergic to Feraheme -Start oral supplementation -Stool softner   DVT prophylaxis:Eliquis Code Status: DNI Family Communication: None at bedside; pt will inform spouse Disposition Plan:  Status is: Inpatient  Remains inpatient appropriate because:IV treatments appropriate due to intensity of illness or inability to take PO and Inpatient level of care appropriate due to severity of illness   Dispo: The patient is from: Home              Anticipated d/c is to: Home              Anticipated d/c date is: 1 day              Patient currently is not medically stable to d/c.  Patient requires further diuresis  IV through today.  Likely discharge tomorrow with conversion to oral medications if stable.   Consultants:   Cardiology  Procedures:   See below  Antimicrobials:   None   Subjective: Patient seen and evaluated today with no new acute complaints or concerns. No acute concerns or events noted overnight.  She states that overall she is breathing much better.  She denies any chest pain or  palpitations.  Objective: Vitals:   12/02/19 0800 12/02/19 0900 12/02/19 1000 12/02/19 1054  BP: 115/67 (!) 106/48  (!) 109/95  Pulse: (!) 43 95 92   Resp: 17 17 (!) 29   Temp: 97.9 F (36.6 C)     TempSrc: Oral     SpO2: (!) 88% 95% 96%   Weight:      Height:        Intake/Output Summary (Last 24 hours) at 12/02/2019 1111 Last data filed at 12/02/2019 0700 Gross per 24 hour  Intake 85.23 ml  Output 3000 ml  Net -2914.77 ml   Filed Weights   12/01/19 0542 12/01/19 1128 12/01/19 1255  Weight: 67.6 kg 68.1 kg 68.1 kg    Examination:  General exam: Appears calm and comfortable  Respiratory system: Clear to auscultation. Respiratory effort normal.  Currently on 3 L nasal cannula oxygen. Cardiovascular system: S1 & S2 heard, irregular Gastrointestinal system: Abdomen is nondistended, soft and nontender.  Central nervous system: Alert and awake Extremities: Scant edema-improved Skin: No rashes, lesions or ulcers Psychiatry: Judgement and insight appear normal. Mood & affect appropriate.     Data Reviewed: I have personally reviewed following labs and imaging studies  CBC: Recent Labs  Lab 12/01/19 0613  WBC 7.1  NEUTROABS 4.8  HGB 9.0*  HCT 29.3*  MCV 89.3  PLT 474   Basic Metabolic Panel: Recent Labs  Lab 12/01/19 0613 12/02/19 0540  NA 135 136  K 3.6 3.0*  CL 104 100  CO2 21* 28  GLUCOSE 122* 100*  BUN 20 18  CREATININE 0.98 0.99  CALCIUM 8.7* 8.0*   GFR: Estimated Creatinine Clearance: 39.7 mL/min (by C-G formula based on SCr of 0.99 mg/dL). Liver Function Tests: Recent Labs  Lab 12/01/19 0613  AST 25  ALT 20  ALKPHOS 56  BILITOT 0.5  PROT 6.6  ALBUMIN 3.8   No results for input(s): LIPASE, AMYLASE in the last 168 hours. No results for input(s): AMMONIA in the last 168 hours. Coagulation Profile: No results for input(s): INR, PROTIME in the last 168 hours. Cardiac Enzymes: No results for input(s): CKTOTAL, CKMB, CKMBINDEX, TROPONINI  in the last 168 hours. BNP (last 3 results) No results for input(s): PROBNP in the last 8760 hours. HbA1C: No results for input(s): HGBA1C in the last 72 hours. CBG: No results for input(s): GLUCAP in the last 168 hours. Lipid Profile: No results for input(s): CHOL, HDL, LDLCALC, TRIG, CHOLHDL, LDLDIRECT in the last 72 hours. Thyroid Function Tests: No results for input(s): TSH, T4TOTAL, FREET4, T3FREE, THYROIDAB in the last 72 hours. Anemia Panel: Recent Labs    12/01/19 1130  VITAMINB12 358  FOLATE 28.3  FERRITIN 4*  TIBC 472*  IRON 13*  RETICCTPCT 1.9   Sepsis Labs: Recent Labs  Lab 12/01/19 0953  PROCALCITON <0.10    Recent Results (from the past 240 hour(s))  Resp Panel by RT-PCR (Flu A&B, Covid) Nasopharyngeal Swab     Status: None   Collection Time: 12/01/19 10:47 AM   Specimen: Nasopharyngeal Swab; Nasopharyngeal(NP) swabs in vial transport medium  Result Value Ref Range Status   SARS Coronavirus 2 by RT PCR NEGATIVE NEGATIVE Final    Comment: (NOTE) SARS-CoV-2 target nucleic acids are NOT DETECTED.  The SARS-CoV-2 RNA is generally detectable in upper respiratory specimens during the acute phase of infection. The lowest concentration of SARS-CoV-2 viral copies this assay can detect is 138 copies/mL. A negative result does not preclude SARS-Cov-2 infection and should not be used as the sole basis for treatment or other patient management decisions. A negative result may occur with  improper specimen collection/handling, submission of specimen other than nasopharyngeal swab, presence of viral mutation(s) within the areas targeted by this assay, and inadequate number of viral copies(<138 copies/mL). A negative result must be combined with clinical observations, patient history, and epidemiological information. The expected result is Negative.  Fact Sheet for Patients:  EntrepreneurPulse.com.au  Fact Sheet for Healthcare Providers:    IncredibleEmployment.be  This test is no t yet approved or cleared by the [place] FDA and  has been authorized for detection and/or diagnosis of SARS-CoV-2 by FDA under an Emergency Use Authorization (EUA). This EUA will remain  in effect (meaning this test can be used) for the duration of the COVID-19 declaration under Section 564(b)(1) of the Act, 21 U.S.C.section 360bbb-3(b)(1), unless the authorization is terminated  or revoked sooner.       Influenza A by PCR NEGATIVE NEGATIVE Final   Influenza B by PCR NEGATIVE NEGATIVE Final    Comment: (NOTE) The Xpert Xpress SARS-CoV-2/FLU/RSV plus assay is intended as an aid in the diagnosis of influenza from Nasopharyngeal swab specimens and should not be used as a sole basis for treatment. Nasal washings and aspirates are unacceptable for Xpert Xpress SARS-CoV-2/FLU/RSV testing.  Fact Sheet for Patients: EntrepreneurPulse.com.au  Fact Sheet for Healthcare Providers: IncredibleEmployment.be  This test is not yet approved or cleared by the [place] FDA and has been authorized for detection and/or diagnosis of SARS-CoV-2 by FDA under an Emergency Use Authorization (EUA). This EUA will remain in effect (meaning this test can be used) for the duration of the COVID-19 declaration under Section 564(b)(1) of the Act, 21 U.S.C. section 360bbb-3(b)(1), unless the authorization is terminated or revoked.  Performed at [facility], [address] [place], [place] [address]   MRSA PCR Screening     Status: None   Collection Time: 12/01/19 12:12 PM   Specimen: Nasal Mucosa; Nasopharyngeal  Result Value Ref Range Status   MRSA by PCR NEGATIVE NEGATIVE Final    Comment:        The GeneXpert MRSA Assay (FDA approved for NASAL specimens only), is one component of a comprehensive MRSA colonization surveillance program. It is not intended to diagnose MRSA infection nor to guide  or monitor treatment for MRSA infections. Performed at [facility], [address] [place], [place] [address]          Radiology Studies: [facility] Chest [facility] 1 View  Result Date: 12/01/2019 CLINICAL DATA:  Shortness of breath for 3 nights. Pain under the diaphragms. Atrial fibrillation and foot swelling. EXAM: PORTABLE CHEST 1 VIEW COMPARISON:  09/25/2019 FINDINGS: Mild cardiac enlargement. Interstitial pattern to the lungs may represent interstitial edema. Early multifocal interstitial pneumonia could also have this appearance. No pleural effusions. No pneumothorax. Mediastinal contours appear intact. Calcification of the aorta. IMPRESSION: Interstitial pattern to the lungs may represent interstitial edema. Early multifocal interstitial pneumonia could also have this appearance. Electronically Signed   By: Lucienne Capers M.D.   On: 12/01/2019 06:35  CT Angio Chest/Abd/Pel for Dissection W and/or W/WO  Result Date: 12/01/2019 CLINICAL DATA:  Shortness of breath, chest and back pain. EXAM: CT ANGIOGRAPHY CHEST, ABDOMEN AND PELVIS TECHNIQUE: Non-contrast CT of the chest was initially obtained. Multidetector CT imaging through the chest, abdomen and pelvis was performed using the standard protocol during bolus administration of intravenous contrast. Multiplanar reconstructed images and MIPs were obtained and reviewed to evaluate the vascular anatomy. CONTRAST:  178mL OMNIPAQUE IOHEXOL 350 MG/ML SOLN COMPARISON:  October 08, 2012. FINDINGS: CTA CHEST FINDINGS Cardiovascular: Atherosclerosis of thoracic aorta is noted without aneurysm or dissection. Mild cardiomegaly is noted. No pericardial effusion is noted. Mild coronary artery calcifications are noted. Mediastinum/Nodes: 1.6 cm left thyroid nodule is noted. Esophagus is unremarkable. No adenopathy is noted. Lungs/Pleura: No pneumothorax is noted. Moderate bilateral pleural effusions are noted, right greater than left, with minimal bibasilar  subsegmental atelectasis or edema. Musculoskeletal: No chest wall abnormality. No acute or significant osseous findings. Review of the MIP images confirms the above findings. CTA ABDOMEN AND PELVIS FINDINGS VASCULAR Aorta: Atherosclerosis of abdominal aorta is noted without aneurysm or dissection. Celiac: Patent without evidence of aneurysm, dissection, vasculitis or significant stenosis. SMA: Patent without evidence of aneurysm, dissection, vasculitis or significant stenosis. Renals: Both renal arteries are patent without evidence of aneurysm, dissection, vasculitis, fibromuscular dysplasia or significant stenosis. IMA: Patent without evidence of aneurysm, dissection, vasculitis or significant stenosis. Inflow: Patent without evidence of aneurysm, dissection, vasculitis or significant stenosis. Veins: No obvious venous abnormality within the limitations of this arterial phase study. Review of the MIP images confirms the above findings. NON-VASCULAR Hepatobiliary: No focal liver abnormality is seen. Status post cholecystectomy. No biliary dilatation. Pancreas: Unremarkable. No pancreatic ductal dilatation or surrounding inflammatory changes. Spleen: Normal in size without focal abnormality. Adrenals/Urinary Tract: Adrenal glands appear normal. Left renal cyst is noted. Partially duplicated right ureteral system is noted. Mild right hydroureteronephrosis is noted without obstructing calculus. Mild urinary bladder distention is noted. Stomach/Bowel: The stomach appears normal. There is no evidence of bowel obstruction or inflammation. Sigmoid diverticulosis is noted without inflammation. The appendix is not clearly visualized. Lymphatic: No significant adenopathy is noted. Reproductive: Status post hysterectomy. No adnexal masses. Other: Mild amount of free fluid is noted in the pelvis and around the liver. No significant hernia is noted. Musculoskeletal: No acute or significant osseous findings. Review of the MIP  images confirms the above findings. IMPRESSION: 1. There is no evidence of thoracic or abdominal aortic dissection or aneurysm. 2. Mild coronary artery calcifications are noted suggesting coronary artery disease. 3. Moderate bilateral pleural effusions are noted, right greater than left, with minimal bibasilar subsegmental atelectasis or edema. 4. 1.6 cm left thyroid nodule is noted. Recommend thyroid US. (Ref: J Am Coll Radiol. 2015 Feb;12(2): 143-50). 5. Mild right hydroureteronephrosis is noted without obstructing calculus. Mild urinary bladder distention is noted. 6. Sigmoid diverticulosis without inflammation. 7. Mild amount of free fluid is noted in the pelvis and around the liver. 8. Aortic atherosclerosis. Aortic Atherosclerosis (ICD10-I70.0). Electronically Signed   By: Marijo Conception M.D.   On: 12/01/2019 08:55        Scheduled Meds: . apixaban  5 mg Oral BID  . Chlorhexidine Gluconate Cloth  6 each Topical Daily  . diltiazem  60 mg Oral Q8H  . docusate sodium  100 mg Oral Daily  . escitalopram  10 mg Oral Daily  . furosemide  40 mg Intravenous Q12H  . hydroxychloroquine  200 mg Oral BID  .  levothyroxine  112 mcg Oral QAC breakfast  . loratadine  10 mg Oral Daily  . potassium chloride  40 mEq Oral BID  . sodium chloride flush  3 mL Intravenous Q12H  . traMADol  50 mg Oral BID   Continuous Infusions: . sodium chloride       LOS: 1 day    Time spent: 30 minutes    Caelen Reierson Darleen Crocker, DO [facility]  If 7PM-7AM, please contact night-coverage [email or web address] 12/02/2019, 11:11 AM

## 2019-12-02 NOTE — Progress Notes (Signed)
Patient is now on room air with an oxygen saturation of 95-96%. Patient ambulated three times around the unit with no difficulty and no change in saturation or work of breathing. Will continue to monitor.

## 2019-12-02 NOTE — Progress Notes (Addendum)
Progress Note  Patient Name: Erin Schmidt Date of Encounter: 12/02/2019  Primary Cardiologist: Carlyle Dolly, MD   Subjective   Breathing significantly improved since admission but still on supplemental oxygen. No chest pain or palpitations.    Inpatient Medications    Scheduled Meds:  apixaban  5 mg Oral BID   Chlorhexidine Gluconate Cloth  6 each Topical Daily   docusate sodium  100 mg Oral Daily   escitalopram  10 mg Oral Daily   furosemide  40 mg Intravenous Q12H   hydroxychloroquine  200 mg Oral BID   levothyroxine  112 mcg Oral QAC breakfast   loratadine  10 mg Oral Daily   potassium chloride  40 mEq Oral BID   sodium chloride flush  3 mL Intravenous Q12H   traMADol  50 mg Oral BID   Continuous Infusions:  sodium chloride     diltiazem (CARDIZEM) infusion 10 mg/hr (12/02/19 0618)   PRN Meds: sodium chloride, acetaminophen, meclizine, ondansetron (ZOFRAN) IV, sodium chloride flush   Vital Signs    Vitals:   12/02/19 0700 12/02/19 0730 12/02/19 0800 12/02/19 0900  BP: 119/71 119/66 115/67 (!) 106/48  Pulse: 93 78 (!) 43 95  Resp: 13 (!) 23 17 17   Temp:   97.9 F (36.6 C)   TempSrc:   Oral   SpO2: 97% 96% (!) 88% 95%  Weight:      Height:        Intake/Output Summary (Last 24 hours) at 12/02/2019 0944 Last data filed at 12/02/2019 0700 Gross per 24 hour  Intake 85.23 ml  Output 3000 ml  Net -2914.77 ml    Last 3 Weights 12/01/2019 12/01/2019 12/01/2019  Weight (lbs) 150 lb 2.1 oz 150 lb 2.1 oz 149 lb 0.5 oz  Weight (kg) 68.1 kg 68.1 kg 67.6 kg      Telemetry    Atrial fibrillation, HR in 80's to 90's. Episode of 8 beats NSVT this AM. - Personally Reviewed  ECG    Atrial fibrillation with RVR, HR 123 with known RBBB.  - Personally Reviewed  Physical Exam   General: Well developed, elderly female appearing in no acute distress. Head: Normocephalic, atraumatic.  Neck: Supple without bruits, JVD not elevated. Lungs:  Resp regular  and unlabored, mild rales along bases. Heart: Irregularly irregular, S1, S2, no S3, S4, 2/6 murmur along Apex.  Abdomen: Soft, non-tender, non-distended with normoactive bowel sounds. No hepatomegaly. No rebound/guarding. No obvious abdominal masses. Extremities: No clubbing, cyanosis, or lower extremity edema. Distal pedal pulses are 2+ bilaterally. Neuro: Alert and oriented X 3. Moves all extremities spontaneously. Psych: Normal affect.  Labs    Chemistry Recent Labs  Lab 12/01/19 0613 12/02/19 0540  NA 135 136  K 3.6 3.0*  CL 104 100  CO2 21* 28  GLUCOSE 122* 100*  BUN 20 18  CREATININE 0.98 0.99  CALCIUM 8.7* 8.0*  PROT 6.6  --   ALBUMIN 3.8  --   AST 25  --   ALT 20  --   ALKPHOS 56  --   BILITOT 0.5  --   GFRNONAA 57* 56*  ANIONGAP 10 8     Hematology Recent Labs  Lab 12/01/19 0613 12/01/19 1130  WBC 7.1  --   RBC 3.28* 3.31*  HGB 9.0*  --   HCT 29.3*  --   MCV 89.3  --   MCH 27.4  --   MCHC 30.7  --   RDW 14.9  --  PLT 288  --     Cardiac EnzymesNo results for input(s): TROPONINI in the last 168 hours. No results for input(s): TROPIPOC in the last 168 hours.   BNP Recent Labs  Lab 12/01/19 0613  BNP 701.0*     DDimer No results for input(s): DDIMER in the last 168 hours.   Radiology    DG Chest Port 1 View  Result Date: 12/01/2019 CLINICAL DATA:  Shortness of breath for 3 nights. Pain under the diaphragms. Atrial fibrillation and foot swelling. EXAM: PORTABLE CHEST 1 VIEW COMPARISON:  09/25/2019 FINDINGS: Mild cardiac enlargement. Interstitial pattern to the lungs may represent interstitial edema. Early multifocal interstitial pneumonia could also have this appearance. No pleural effusions. No pneumothorax. Mediastinal contours appear intact. Calcification of the aorta. IMPRESSION: Interstitial pattern to the lungs may represent interstitial edema. Early multifocal interstitial pneumonia could also have this appearance. Electronically Signed    By: Lucienne Capers M.D.   On: 12/01/2019 06:35   CT Angio Chest/Abd/Pel for Dissection W and/or W/WO  Result Date: 12/01/2019 CLINICAL DATA:  Shortness of breath, chest and back pain. EXAM: CT ANGIOGRAPHY CHEST, ABDOMEN AND PELVIS TECHNIQUE: Non-contrast CT of the chest was initially obtained. Multidetector CT imaging through the chest, abdomen and pelvis was performed using the standard protocol during bolus administration of intravenous contrast. Multiplanar reconstructed images and MIPs were obtained and reviewed to evaluate the vascular anatomy. CONTRAST:  116mL OMNIPAQUE IOHEXOL 350 MG/ML SOLN COMPARISON:  October 08, 2012. FINDINGS: CTA CHEST FINDINGS Cardiovascular: Atherosclerosis of thoracic aorta is noted without aneurysm or dissection. Mild cardiomegaly is noted. No pericardial effusion is noted. Mild coronary artery calcifications are noted. Mediastinum/Nodes: 1.6 cm left thyroid nodule is noted. Esophagus is unremarkable. No adenopathy is noted. Lungs/Pleura: No pneumothorax is noted. Moderate bilateral pleural effusions are noted, right greater than left, with minimal bibasilar subsegmental atelectasis or edema. Musculoskeletal: No chest wall abnormality. No acute or significant osseous findings. Review of the MIP images confirms the above findings. CTA ABDOMEN AND PELVIS FINDINGS VASCULAR Aorta: Atherosclerosis of abdominal aorta is noted without aneurysm or dissection. Celiac: Patent without evidence of aneurysm, dissection, vasculitis or significant stenosis. SMA: Patent without evidence of aneurysm, dissection, vasculitis or significant stenosis. Renals: Both renal arteries are patent without evidence of aneurysm, dissection, vasculitis, fibromuscular dysplasia or significant stenosis. IMA: Patent without evidence of aneurysm, dissection, vasculitis or significant stenosis. Inflow: Patent without evidence of aneurysm, dissection, vasculitis or significant stenosis. Veins: No obvious venous  abnormality within the limitations of this arterial phase study. Review of the MIP images confirms the above findings. NON-VASCULAR Hepatobiliary: No focal liver abnormality is seen. Status post cholecystectomy. No biliary dilatation. Pancreas: Unremarkable. No pancreatic ductal dilatation or surrounding inflammatory changes. Spleen: Normal in size without focal abnormality. Adrenals/Urinary Tract: Adrenal glands appear normal. Left renal cyst is noted. Partially duplicated right ureteral system is noted. Mild right hydroureteronephrosis is noted without obstructing calculus. Mild urinary bladder distention is noted. Stomach/Bowel: The stomach appears normal. There is no evidence of bowel obstruction or inflammation. Sigmoid diverticulosis is noted without inflammation. The appendix is not clearly visualized. Lymphatic: No significant adenopathy is noted. Reproductive: Status post hysterectomy. No adnexal masses. Other: Mild amount of free fluid is noted in the pelvis and around the liver. No significant hernia is noted. Musculoskeletal: No acute or significant osseous findings. Review of the MIP images confirms the above findings. IMPRESSION: 1. There is no evidence of thoracic or abdominal aortic dissection or aneurysm. 2. Mild coronary artery calcifications are  noted suggesting coronary artery disease. 3. Moderate bilateral pleural effusions are noted, right greater than left, with minimal bibasilar subsegmental atelectasis or edema. 4. 1.6 cm left thyroid nodule is noted. Recommend thyroid US. (Ref: J Am Coll Radiol. 2015 Feb;12(2): 143-50). 5. Mild right hydroureteronephrosis is noted without obstructing calculus. Mild urinary bladder distention is noted. 6. Sigmoid diverticulosis without inflammation. 7. Mild amount of free fluid is noted in the pelvis and around the liver. 8. Aortic atherosclerosis. Aortic Atherosclerosis (ICD10-I70.0). Electronically Signed   By: Marijo Conception M.D.   On: 12/01/2019 08:55      Cardiac Studies   Echocardiogram: 10/2019 IMPRESSIONS     1. Left ventricular ejection fraction, by estimation, is 50 to 55%. The  left ventricle has normal function. The left ventricle has no regional  wall motion abnormalities. Left ventricular diastolic parameters are  indeterminate.   2. Right ventricular systolic function is normal. The right ventricular  size is normal. There is mildly elevated pulmonary artery systolic  pressure.   3. Left atrial size was severely dilated.   4. Right atrial size was moderately dilated.   5. The mitral valve is normal in structure. Moderate mitral valve  regurgitation. No evidence of mitral stenosis.   6. Tricuspid valve regurgitation is mild to moderate.   7. The aortic valve is tricuspid. Aortic valve regurgitation is not  visualized. No aortic stenosis is present.   8. The inferior vena cava is dilated in size with <50% respiratory  variability, suggesting right atrial pressure of 15 mmHg.   Patient Profile     86 y.o. female w/ PMH of persistent atrial fibrillation (s/p DCCV on 11/10/2019 with recurrence at follow-up --> rate-control pursued given severe LA dilation), HTN, HLD, hypothyroidism and iron deficiency anemia who is currently admitted for atrial fibrillation with RVR and a CHF exacerbation.   Assessment & Plan    1. Atrial Fibrillation with RVR - Underwent DCCV earlier this month and converted back to NSR but quickly went back into atrial fibrillation following the procedure. Given no reported difference in symptoms when in atrual fibrillation versus sinus rhythm, a rate-control strategy was pursued and due to her LA dilation.  - Presented to the ED in atrial fibrillation with RVR. Was on Cardizem CD 180mg  daily but currently on IV Cardizem at 10 mg/hr and HR is in the 80's to 90's. Will write for short-acting Cardizem 60mg  Q8H and monitor HR and BP response. Can likely switch back to Cardizem CD tomorrow (will likely require  180mg  daily or 240mg  daily given HR). Will also check HR with ambulation prior to discharge as she is very active around the home.  - Remains on Eliquis 5mg  BID for anticoagulation.   2. Acute on Chronic Diastolic CHF Exacerbation - Reported a 10 lb weight gain since her last office visit with associated dyspnea on exertion, orthopnea and edema. Was previously on HCTZ 6.25mg  daily but this had been discontinued at her last visit due to soft BP and to allow for further titration of her AV nodal blocking agents.  - BNP elevated to 701 on admission and CXR consistent with CHF. She has been receiving IV Lasix 40mg  BID with a recorded output of -3.9L thus far. Continue with IV Lasix today given oxygen requirement. Can likely switch back to PO diuretic tomorrow.   3. HTN - BP has been soft at 90/50 - 119/71 within the past 24 hours, at 106/48 on most recent check. Will try to stop IV  Cardizem as outlined above. Would not add back Benazepril at this time given soft BP.    4. Mitral Regurgitation - Moderate by echocardiogram in 10/2019. Will continue to follow.   5. NSVT - She did have 8 beats NSVT this AM by review of telemetry. K+ low at 3.0 and replacement has been ordered by the admitting team. Try to keep K+ ~ 4.0. Will check Mg.   6. Anemia - Hgb at 9.0 on admission which is close to her baseline. Denies any evidence of active bleeding.    For questions or updates, please contact [place] Please consult [email or web address] for contact info under Cardiology/STEMI.   Arna Medici , PA-C 9:44 AM 12/02/2019 Pager: [phone]  Patient examined chart reviewed Exam with no murmur clear lungs and rates in 80-90's on telemetry Agree with changing to oral once daily lasix 20 mg in am Also agree with changing to LA cardizem PO 240 mg daily   Jenkins Rouge MD Leonidas Romberg

## 2019-12-02 NOTE — Progress Notes (Signed)
Cardizem drip stopped at 1245. Heart rate sustaining mid 80's to upper 90's, no change in rhythm. Will continue to monitor.

## 2019-12-02 NOTE — Progress Notes (Signed)
Per report this am patient wore 2L oxygen via nasal cannula while sleeping. Attempted to remove upon awakening this am and patient's saturation dropped to the low 80's. Placed patient back on nasal cannula at 3 liters and is currently at 95%. Will continue to monitor.

## 2019-12-03 DIAGNOSIS — I4891 Unspecified atrial fibrillation: Secondary | ICD-10-CM | POA: Diagnosis not present

## 2019-12-03 DIAGNOSIS — E039 Hypothyroidism, unspecified: Secondary | ICD-10-CM | POA: Diagnosis not present

## 2019-12-03 DIAGNOSIS — I5033 Acute on chronic diastolic (congestive) heart failure: Secondary | ICD-10-CM | POA: Diagnosis not present

## 2019-12-03 LAB — CBC
HCT: 28.9 % — ABNORMAL LOW (ref 36.0–46.0)
Hemoglobin: 8.7 g/dL — ABNORMAL LOW (ref 12.0–15.0)
MCH: 26.5 pg (ref 26.0–34.0)
MCHC: 30.1 g/dL (ref 30.0–36.0)
MCV: 88.1 fL (ref 80.0–100.0)
Platelets: 255 10*3/uL (ref 150–400)
RBC: 3.28 MIL/uL — ABNORMAL LOW (ref 3.87–5.11)
RDW: 15 % (ref 11.5–15.5)
WBC: 6.6 10*3/uL (ref 4.0–10.5)
nRBC: 0 % (ref 0.0–0.2)

## 2019-12-03 LAB — BASIC METABOLIC PANEL
Anion gap: 9 (ref 5–15)
BUN: 20 mg/dL (ref 8–23)
CO2: 30 mmol/L (ref 22–32)
Calcium: 8.1 mg/dL — ABNORMAL LOW (ref 8.9–10.3)
Chloride: 99 mmol/L (ref 98–111)
Creatinine, Ser: 1.04 mg/dL — ABNORMAL HIGH (ref 0.44–1.00)
GFR, Estimated: 53 mL/min — ABNORMAL LOW (ref >60)
Glucose, Bld: 99 mg/dL (ref 70–99)
Potassium: 3.3 mmol/L — ABNORMAL LOW (ref 3.5–5.1)
Sodium: 138 mmol/L (ref 135–145)

## 2019-12-03 LAB — MAGNESIUM: Magnesium: 2.1 mg/dL (ref 1.7–2.4)

## 2019-12-03 MED ORDER — FUROSEMIDE 20 MG PO TABS: 10.0000 mg | ORAL_TABLET | Freq: Every day | ORAL | 2 refills | Status: DC

## 2019-12-03 MED ORDER — DILTIAZEM HCL ER COATED BEADS 240 MG PO CP24
240.0000 mg | ORAL_CAPSULE | Freq: Every day | ORAL | Status: DC
  Administered 2019-12-03: 240 mg via ORAL
  Filled 2019-12-03: qty 1

## 2019-12-03 MED ORDER — DILTIAZEM HCL ER COATED BEADS 240 MG PO CP24
240.0000 mg | ORAL_CAPSULE | Freq: Every day | ORAL | 3 refills | Status: DC
Start: 2019-12-03 — End: 2020-04-09

## 2019-12-03 MED ORDER — DILTIAZEM HCL ER COATED BEADS 240 MG PO CP24: 240.0000 mg | ORAL_CAPSULE | Freq: Every day | ORAL | 3 refills | Status: DC

## 2019-12-03 MED ORDER — POTASSIUM CHLORIDE CRYS ER 20 MEQ PO TBCR
40.0000 meq | EXTENDED_RELEASE_TABLET | Freq: Once | ORAL | Status: AC
  Administered 2019-12-03: 40 meq via ORAL
  Filled 2019-12-03: qty 2

## 2019-12-03 NOTE — Progress Notes (Addendum)
Progress Note  Patient Name: Erin Schmidt Date of Encounter: 12/03/2019  Primary Cardiologist: Carlyle Dolly, MD   Subjective   Breathing back to baseline and swelling resolved. No chest pain or palpitations.   Inpatient Medications    Scheduled Meds: . apixaban  5 mg Oral BID  . Chlorhexidine Gluconate Cloth  6 each Topical Daily  . diltiazem  60 mg Oral Q8H  . docusate sodium  100 mg Oral Daily  . escitalopram  10 mg Oral Daily  . furosemide  40 mg Intravenous Q12H  . hydroxychloroquine  200 mg Oral BID  . levothyroxine  112 mcg Oral QAC breakfast  . loratadine  10 mg Oral Daily  . sodium chloride flush  3 mL Intravenous Q12H  . traMADol  50 mg Oral BID   Continuous Infusions: . sodium chloride     PRN Meds: sodium chloride, acetaminophen, meclizine, ondansetron (ZOFRAN) IV, sodium chloride flush   Vital Signs    Vitals:   12/02/19 1500 12/02/19 1822 12/02/19 2150 12/03/19 0531  BP: 105/83 111/63 110/76 107/66  Pulse: 90 (!) 101 92 94  Resp: (!) 23 20 16 18   Temp: 98 F (36.7 C) 98.2 F (36.8 C) 98.3 F (36.8 C) 97.7 F (36.5 C)  TempSrc: Oral Oral Oral Oral  SpO2: 97% 96% 95% 92%  Weight:    69.1 kg  Height:        Intake/Output Summary (Last 24 hours) at 12/03/2019 1012 Last data filed at 12/02/2019 2149 Gross per 24 hour  Intake 688.04 ml  Output --  Net 688.04 ml    Last 3 Weights 12/03/2019 12/01/2019 12/01/2019  Weight (lbs) 152 lb 5.4 oz 150 lb 2.1 oz 150 lb 2.1 oz  Weight (kg) 69.1 kg 68.1 kg 68.1 kg      Telemetry    Atrial fibrillation, HR in 90's to 120's. - Personally Reviewed  ECG    No new tracings.   Physical Exam   General: Well developed, well nourished, female appearing in no acute distress. Head: Normocephalic, atraumatic.  Neck: Supple without bruits, JVD not elevated. Lungs:  Resp regular and unlabored, CTA. Heart: Irregularly irregular, S1, S2, no S3, S4, or murmur; no rub. Abdomen: Soft, non-tender,  non-distended with normoactive bowel sounds. No hepatomegaly. No rebound/guarding. No obvious abdominal masses. Extremities: No clubbing, cyanosis, or edema. Distal pedal pulses are 2+ bilaterally. Neuro: Alert and oriented X 3. Moves all extremities spontaneously. Psych: Normal affect.  Labs    Chemistry Recent Labs  Lab 12/01/19 0613 12/02/19 0540 12/03/19 0645  NA 135 136 138  K 3.6 3.0* 3.3*  CL 104 100 99  CO2 21* 28 30  GLUCOSE 122* 100* 99  BUN 20 18 20   CREATININE 0.98 0.99 1.04*  CALCIUM 8.7* 8.0* 8.1*  PROT 6.6  --   --   ALBUMIN 3.8  --   --   AST 25  --   --   ALT 20  --   --   ALKPHOS 56  --   --   BILITOT 0.5  --   --   GFRNONAA 57* 56* 53*  ANIONGAP 10 8 9      Hematology Recent Labs  Lab 12/01/19 0613 12/01/19 1130 12/03/19 0645  WBC 7.1  --  6.6  RBC 3.28* 3.31* 3.28*  HGB 9.0*  --  8.7*  HCT 29.3*  --  28.9*  MCV 89.3  --  88.1  MCH 27.4  --  26.5  MCHC  30.7  --  30.1  RDW 14.9  --  15.0  PLT 288  --  255    Cardiac EnzymesNo results for input(s): TROPONINI in the last 168 hours. No results for input(s): TROPIPOC in the last 168 hours.   BNP Recent Labs  Lab 12/01/19 0613  BNP 701.0*     Radiology    No results found.  Cardiac Studies   Echocardiogram: 10/2019 IMPRESSIONS   1. Left ventricular ejection fraction, by estimation, is 50 to 55%. The  left ventricle has normal function. The left ventricle has no regional  wall motion abnormalities. Left ventricular diastolic parameters are  indeterminate.  2. Right ventricular systolic function is normal. The right ventricular  size is normal. There is mildly elevated pulmonary artery systolic  pressure.  3. Left atrial size was severely dilated.  4. Right atrial size was moderately dilated.  5. The mitral valve is normal in structure. Moderate mitral valve  regurgitation. No evidence of mitral stenosis.  6. Tricuspid valve regurgitation is mild to moderate.  7. The  aortic valve is tricuspid. Aortic valve regurgitation is not  visualized. No aortic stenosis is present.  8. The inferior vena cava is dilated in size with <50% respiratory  variability, suggesting right atrial pressure of 15 mmHg.   Patient Profile     86 y.o. female w/ PMH of persistent atrial fibrillation (s/p DCCV on 11/10/2019 with recurrence at follow-up --> rate-control pursued given severe LA dilation), HTN, HLD, hypothyroidism and iron deficiency anemiawho is currently admitted for atrial fibrillation with RVR and a CHFexacerbation.  Assessment & Plan    1. Atrial Fibrillation with RVR - Underwent DCCV earlier this month and converted back to NSR but quickly went back into atrial fibrillation following the procedure. Given no reported difference in symptoms when in atrual fibrillation versus sinus rhythm, a rate-control strategy was pursued and due to her LA dilation.  - HR has improved but is in the 90's to low-100's with rest and goes into the 120's with ambulation. She is very active at home per her report, therefore Cardizem CD will be further titrated from 180mg  daily to 240mg  daily.  - Continue Eliquis 5mg  BID for anticoagulation.   2. Acute on Chronic Diastolic CHF Exacerbation - Reported a 10 lb weight gain since her last office visit with associated dyspnea on exertion, orthopnea and edema. Was previously on HCTZ 6.25mg  daily but this had been discontinued at her last visit due to soft BP. - BNP elevated to 701 on admission and CXR consistent with CHF. Responded well with IV Lasix and net negative -3.2 L this admission. Would anticipate restarting her HCTZ 6.25mg  daily as an outpatient. If unavailable from her pharmacy due to it being a capsule, may need to take 12.5mg  every other day or would switch to Lasix 10mg  daily which can be titrated as an outpatient if needed. She was encouraged to follow daily weights at home and to notify our office if this starts to increase.     3. HTN - BP has been soft at 102/77 - 119/71 within the past 24 hours. Benazepril has been discontinued. Will titrate Cardizem CD as outlined above. She was encouraged to follow readings at home and bring this to her office visit.   4. Mitral Regurgitation - Moderate by echocardiogram in 10/2019.   5. NSVT - She did brief episodes of NSVT this admission in the setting of hypokalemia. K+ 3.3 this AM. Will order supplementation. Will need a  repeat BMET as an outpatient.   6. Anemia - Hgb at 8.7 this AM and work-up positive for iron deficiency anemia with Ferritin at 4. No reports of active bleeding. Previously at 13.4 in 09/2019. Will need to be on iron supplementation. Previously allergic to [facility].    For questions or updates, please contact [place] Please consult [email or web address] for contact info under Cardiology/STEMI.   Arna Medici , PA-C 10:12 AM 12/03/2019 Pager: [phone]   Attending note:  Chart reviewed including recent progress, case discussed with Ms. Delano Metz and agree with her above findings.  Ms. Thoennes remains in persistent atrial fibrillation with plan for heart rate control.  She is on Eliquis for stroke prophylaxis.  Due to lower blood pressures, benazepril was discontinued and we are planning to further uptitrate Cardizem CD for more optimal heart rate control with activity.  Fluid status also improving on IV Lasix with planned reinitiation of HCTZ which she had tolerated reasonably well as an outpatient.  Her last echocardiogram in October revealed LVEF 50 to 55% with severely dilated left atrium.  Pertinent lab work includes potassium 3.3, creatinine 1.04, hemoglobin 8.7, platelets 255.  Increase Cardizem CD to 240 mg daily, continue Eliquis, switch from IV Lasix to HCTZ, and replete potassium.  Satira Sark, M.D., F.A.C.C.

## 2019-12-03 NOTE — Discharge Summary (Signed)
Physician Discharge Summary  Erin Schmidt SWF:[id number] DOB: 22-Aug-1932 DOA: 12/01/2019  PCP: Sharilyn Sites, MD  Admit date: 12/01/2019 Discharge date: 12/03/2019  Time spent: 35 minutes  Recommendations for Outpatient Follow-up:  1. Repeat basic metabolic panel to evaluate lites and renal function 2. Repeat CBC to follow hemoglobin trend 3. Start patient on iron supplementation and if needed arrange for outpatient GI evaluation.   Discharge Diagnoses:  Active Problems:   Atrial fibrillation with rapid ventricular response (HCC)   Acute on chronic diastolic CHF (congestive heart failure) (HCC)   Hypothyroidism anemia of chronic disease Iron deficiency HTN  Discharge Condition: Stable and improved.  Discharged home with instruction to follow-up with cardiology service and PCP.  CODE STATUS : partial (patient is DNI).  Diet recommendation: Heart healthy/low-sodium diet.  Filed Weights   12/01/19 1128 12/01/19 1255 12/03/19 0531  Weight: 68.1 kg 68.1 kg 69.1 kg    History of present illness:  Per HPI: Erin Schmidt a 86 y.o.femalewith medical history significant foratrial fibrillation on Eliquis, hypertension, hypothyroidism, vertigo, and iron deficiency anemia who presented to the ED with worsening shortness of breath as well as lower extremity edema. She was diagnosed with atrial fibrillation recently and was started on diltiazem and Eliquis for treatment. She was taken off of her benazepril-HCTZ on 11/11 to allow further titration of her Cardizem and she began noticing some swelling in her feet about 3 to 4 days prior. The swelling would go away with laying down at night, however this has persisted in the last 2 days. She has also noted worsening shortness of breath when laying down at night and had to come into the ED today as she noted her heart racing at around 4 AM and she also has some nausea and diaphoresis. She also describes some anterior chest  soreness and pain between her shoulder blades in the last few days. She has had 2D echocardiogram on 10/21 with LVEF 22-02% and no diastolic dysfunction noted.  Hospital Course:  1-acute on chronic diastolic CHF exacerbation -Excellent diuresis with IV Lasix -Patient denies shortness of breath, orthopnea and chest pain. -Good oxygen saturation on room air -Will discharge from 10 mg of Lasix on daily basis -Instruction to maintain adequate hydration and to follow low-sodium diet -Cardiology service will arrange outpatient follow-up with intentions to further adjust her medication regimen.  2-atrial fibrillation with RVR -Rate control with adjusted dose of Cardizem -Continue Eliquis for secondary prevention -Recent 2D echo demonstrating ejection fraction 5055% in October 2021. -Outpatient follow-up with cardiology service has been requested.  3-hypokalemia in the setting of acute diuresis -Electrolytes have been repleted -Will recommend repeat basic metabolic panel at follow-up visit and at that time decide if daily supplementation will be required.  4-hypertension -Stable and well-controlled -Continue current antihypertensive agents.  5-hypothyroidism -Continue home levothyroxine  6-anemia of chronic disease -No signs of overt bleeding -Anemia panel demonstrated some complaining of iron deficiency -Stable at time of discharge -Repeat CBC to follow hemoglobin trend -Provide iron supplementation and if needed outpatient evaluation with GI service.  Procedures: See below for x-ray reports.  Consultations:  Cardiology service  Discharge Exam: Vitals:   12/02/19 2150 12/03/19 0531  BP: 110/76 107/66  Pulse: 92 94  Resp: 16 18  Temp: 98.3 F (36.8 C) 97.7 F (36.5 C)  SpO2: 95% 92%    General: Afebrile, chest pain, no nausea, no vomiting. Reports feeling ready to go home. Denies palpitations at this time. Cardiovascular: Rate controlled, no rubs,  no gallops, no  JVD. Respiratory: Improved air movement bilaterally, no wheezing, no crackles, no using accessory muscles. Abdomen: Soft, nontender, distended, positive bowel sounds Extremities: No cyanosis or clubbing.  Discharge Instructions   Discharge Instructions    (HEART FAILURE PATIENTS) Call MD:  Anytime you have any of the following symptoms: 1) 3 pound weight gain in 24 hours or 5 pounds in 1 week 2) shortness of breath, with or without a dry hacking cough 3) swelling in the hands, feet or stomach 4) if you have to sleep on extra pillows at night in order to breathe.   Complete by: As directed    Diet - low sodium heart healthy   Complete by: As directed    Discharge instructions   Complete by: As directed    Take medications as prescribed Maintain adequate hydration Follow heart healthy diet and check your weight on daily basis. Follow-up with cardiology service as instructed Arrange follow-up with your PCP in 10 days.     Allergies as of 12/03/2019      Reactions   Feraheme [ferumoxytol]    Reacted during infusion requiring medical intervention. Increased BP      Medication List    TAKE these medications   apixaban 5 MG Tabs tablet Commonly known as: Eliquis Take 1 tablet (5 mg total) by mouth 2 (two) times daily.   CALCIUM + D PO Take 1 tablet by mouth 2 (two) times daily.   cetirizine 10 MG tablet Commonly known as: ZYRTEC Take 10 mg by mouth daily.   diltiazem 240 MG 24 hr capsule Commonly known as: CARDIZEM CD Take 1 capsule (240 mg total) by mouth daily. What changed:   medication strength  how much to take   Enbrel SureClick 50 MG/ML injection Generic drug: etanercept Inject 50 mg into the skin once a week.   escitalopram 10 MG tablet Commonly known as: LEXAPRO Take 10 mg by mouth daily.   furosemide 20 MG tablet Commonly known as: LASIX Take 0.5 tablets (10 mg total) by mouth daily.   hydroxychloroquine 200 MG tablet Commonly known as:  PLAQUENIL TAKE 1 TABLET BY MOUTH TWICE A DAY   levothyroxine 112 MCG tablet Commonly known as: SYNTHROID Take 112 mcg by mouth daily before breakfast.   meclizine 25 MG tablet Commonly known as: ANTIVERT Take 25 mg by mouth 3 (three) times daily as needed for dizziness.   traMADol-acetaminophen 37.5-325 MG tablet Commonly known as: ULTRACET Take 1 tablet by mouth in the morning and at bedtime.   VITAMIN D3 PO Take by mouth.      Allergies  Allergen Reactions  . Feraheme [Ferumoxytol]     Reacted during infusion requiring medical intervention. Increased BP    Follow-up Information    Erma Heritage, PA-C Follow up on 12/17/2019.   Specialties: Physician Assistant, Cardiology Why: Cardiology Hospital Follow-up on 12/17/2019 at 2:30 PM.  Contact information: [place] [address] [phone]        Sharilyn Sites, MD. Schedule an appointment as soon as possible for a visit in 10 day(s).   Specialty: Family Medicine Contact information: [address] [place] [address] [phone]        Arnoldo Lenis, MD .   Specialty: Cardiology Contact information: [place] [phone]               The results of significant diagnostics from this hospitalization (including imaging, microbiology, ancillary and  laboratory) are listed below for reference.    Significant Diagnostic Studies: DG Chest Port 1 View  Result Date: 12/01/2019 CLINICAL DATA:  Shortness of breath for 3 nights. Pain under the diaphragms. Atrial fibrillation and foot swelling. EXAM: PORTABLE CHEST 1 VIEW COMPARISON:  09/25/2019 FINDINGS: Mild cardiac enlargement. Interstitial pattern to the lungs may represent interstitial edema. Early multifocal interstitial pneumonia could also have this appearance. No pleural effusions. No pneumothorax. Mediastinal contours appear intact. Calcification of the aorta. IMPRESSION: Interstitial  pattern to the lungs may represent interstitial edema. Early multifocal interstitial pneumonia could also have this appearance. Electronically Signed   By: Lucienne Capers M.D.   On: 12/01/2019 06:35   CT Angio Chest/Abd/Pel for Dissection W and/or W/WO  Result Date: 12/01/2019 CLINICAL DATA:  Shortness of breath, chest and back pain. EXAM: CT ANGIOGRAPHY CHEST, ABDOMEN AND PELVIS TECHNIQUE: Non-contrast CT of the chest was initially obtained. Multidetector CT imaging through the chest, abdomen and pelvis was performed using the standard protocol during bolus administration of intravenous contrast. Multiplanar reconstructed images and MIPs were obtained and reviewed to evaluate the vascular anatomy. CONTRAST:  123mL OMNIPAQUE IOHEXOL 350 MG/ML SOLN COMPARISON:  October 08, 2012. FINDINGS: CTA CHEST FINDINGS Cardiovascular: Atherosclerosis of thoracic aorta is noted without aneurysm or dissection. Mild cardiomegaly is noted. No pericardial effusion is noted. Mild coronary artery calcifications are noted. Mediastinum/Nodes: 1.6 cm left thyroid nodule is noted. Esophagus is unremarkable. No adenopathy is noted. Lungs/Pleura: No pneumothorax is noted. Moderate bilateral pleural effusions are noted, right greater than left, with minimal bibasilar subsegmental atelectasis or edema. Musculoskeletal: No chest wall abnormality. No acute or significant osseous findings. Review of the MIP images confirms the above findings. CTA ABDOMEN AND PELVIS FINDINGS VASCULAR Aorta: Atherosclerosis of abdominal aorta is noted without aneurysm or dissection. Celiac: Patent without evidence of aneurysm, dissection, vasculitis or significant stenosis. SMA: Patent without evidence of aneurysm, dissection, vasculitis or significant stenosis. Renals: Both renal arteries are patent without evidence of aneurysm, dissection, vasculitis, fibromuscular dysplasia or significant stenosis. IMA: Patent without evidence of aneurysm, dissection,  vasculitis or significant stenosis. Inflow: Patent without evidence of aneurysm, dissection, vasculitis or significant stenosis. Veins: No obvious venous abnormality within the limitations of this arterial phase study. Review of the MIP images confirms the above findings. NON-VASCULAR Hepatobiliary: No focal liver abnormality is seen. Status post cholecystectomy. No biliary dilatation. Pancreas: Unremarkable. No pancreatic ductal dilatation or surrounding inflammatory changes. Spleen: Normal in size without focal abnormality. Adrenals/Urinary Tract: Adrenal glands appear normal. Left renal cyst is noted. Partially duplicated right ureteral system is noted. Mild right hydroureteronephrosis is noted without obstructing calculus. Mild urinary bladder distention is noted. Stomach/Bowel: The stomach appears normal. There is no evidence of bowel obstruction or inflammation. Sigmoid diverticulosis is noted without inflammation. The appendix is not clearly visualized. Lymphatic: No significant adenopathy is noted. Reproductive: Status post hysterectomy. No adnexal masses. Other: Mild amount of free fluid is noted in the pelvis and around the liver. No significant hernia is noted. Musculoskeletal: No acute or significant osseous findings. Review of the MIP images confirms the above findings. IMPRESSION: 1. There is no evidence of thoracic or abdominal aortic dissection or aneurysm. 2. Mild coronary artery calcifications are noted suggesting coronary artery disease. 3. Moderate bilateral pleural effusions are noted, right greater than left, with minimal bibasilar subsegmental atelectasis or edema. 4. 1.6 cm left thyroid nodule is noted. Recommend thyroid US. (Ref: J Am Coll Radiol. 2015 Feb;12(2): 143-50). 5. Mild right hydroureteronephrosis is noted without obstructing  calculus. Mild urinary bladder distention is noted. 6. Sigmoid diverticulosis without inflammation. 7. Mild amount of free fluid is noted in the pelvis and  around the liver. 8. Aortic atherosclerosis. Aortic Atherosclerosis (ICD10-I70.0). Electronically Signed   By: Marijo Conception M.D.   On: 12/01/2019 08:55    Microbiology: Recent Results (from the past 240 hour(s))  Resp Panel by RT-PCR (Flu A&B, Covid) Nasopharyngeal Swab     Status: None   Collection Time: 12/01/19 10:47 AM   Specimen: Nasopharyngeal Swab; Nasopharyngeal(NP) swabs in vial transport medium  Result Value Ref Range Status   SARS Coronavirus 2 by RT PCR NEGATIVE NEGATIVE Final    Comment: (NOTE) SARS-CoV-2 target nucleic acids are NOT DETECTED.  The SARS-CoV-2 RNA is generally detectable in upper respiratory specimens during the acute phase of infection. The lowest concentration of SARS-CoV-2 viral copies this assay can detect is 138 copies/mL. A negative result does not preclude SARS-Cov-2 infection and should not be used as the sole basis for treatment or other patient management decisions. A negative result may occur with  improper specimen collection/handling, submission of specimen other than nasopharyngeal swab, presence of viral mutation(s) within the areas targeted by this assay, and inadequate number of viral copies(<138 copies/mL). A negative result must be combined with clinical observations, patient history, and epidemiological information. The expected result is Negative.  Fact Sheet for Patients:  EntrepreneurPulse.com.au  Fact Sheet for Healthcare Providers:  IncredibleEmployment.be  This test is no t yet approved or cleared by the [place] FDA and  has been authorized for detection and/or diagnosis of SARS-CoV-2 by FDA under an Emergency Use Authorization (EUA). This EUA will remain  in effect (meaning this test can be used) for the duration of the COVID-19 declaration under Section 564(b)(1) of the Act, 21 U.S.C.section 360bbb-3(b)(1), unless the authorization is terminated  or revoked sooner.        Influenza A by PCR NEGATIVE NEGATIVE Final   Influenza B by PCR NEGATIVE NEGATIVE Final    Comment: (NOTE) The Xpert Xpress SARS-CoV-2/FLU/RSV plus assay is intended as an aid in the diagnosis of influenza from Nasopharyngeal swab specimens and should not be used as a sole basis for treatment. Nasal washings and aspirates are unacceptable for Xpert Xpress SARS-CoV-2/FLU/RSV testing.  Fact Sheet for Patients: EntrepreneurPulse.com.au  Fact Sheet for Healthcare Providers: IncredibleEmployment.be  This test is not yet approved or cleared by the [place] FDA and has been authorized for detection and/or diagnosis of SARS-CoV-2 by FDA under an Emergency Use Authorization (EUA). This EUA will remain in effect (meaning this test can be used) for the duration of the COVID-19 declaration under Section 564(b)(1) of the Act, 21 U.S.C. section 360bbb-3(b)(1), unless the authorization is terminated or revoked.  Performed at [facility], [address] [place], [place] [address]   MRSA PCR Screening     Status: None   Collection Time: 12/01/19 12:12 PM   Specimen: Nasal Mucosa; Nasopharyngeal  Result Value Ref Range Status   MRSA by PCR NEGATIVE NEGATIVE Final    Comment:        The GeneXpert MRSA Assay (FDA approved for NASAL specimens only), is one component of a comprehensive MRSA colonization surveillance program. It is not intended to diagnose MRSA infection nor to guide or monitor treatment for MRSA infections. Performed at [facility], [address], [place], [place] [address]      Labs: Basic Metabolic Panel: Recent Labs  Lab 12/01/19 [id number] 12/02/19 0540 12/03/19 0645  NA  135 136 138  K 3.6 3.0* 3.3*  CL 104 100 99  CO2 21* 28 30  GLUCOSE 122* 100* 99  BUN 20 18 20   CREATININE 0.98 0.99 1.04*  CALCIUM 8.7* 8.0* 8.1*  MG  --   --  2.1   Liver Function Tests: Recent Labs  Lab 12/01/19 0613  AST 25  ALT 20  ALKPHOS 56   BILITOT 0.5  PROT 6.6  ALBUMIN 3.8   CBC: Recent Labs  Lab 12/01/19 0613 12/03/19 0645  WBC 7.1 6.6  NEUTROABS 4.8  --   HGB 9.0* 8.7*  HCT 29.3* 28.9*  MCV 89.3 88.1  PLT 288 255    BNP (last 3 results) Recent Labs    12/01/19 0613  BNP 701.0*    Signed:  Barton Dubois MD.  [facility] 12/03/2019, 2:36 PM

## 2019-12-07 ENCOUNTER — Telehealth: Payer: Self-pay | Admitting: Physician Assistant

## 2019-12-07 NOTE — Telephone Encounter (Signed)
Returned call to patient who reports dyspnea last night sounds consistent with orthopnea. She has been taking 5 mg lasix. She has gained 4 lbs since Wed (hospital discharge date). She weighs 149.8 lbs today - dry weight likely 146 lbs. I have advised her to increase her lasix to 10 mg for at least 3 days. She will call back if she has additional weight gain tomorrow or if symptoms worsen this afternoon.

## 2019-12-08 ENCOUNTER — Other Ambulatory Visit: Payer: Self-pay | Admitting: *Deleted

## 2019-12-08 NOTE — Patient Outreach (Signed)
[facility]) Care Management  12/08/2019  CHISA KUSHNER [birth year] [id number]   RED ON EMMI ALERT - General Discharge Day # 1 Date: 11/26 Red Alert Reason: Not scheduled follow up   Outreach attempt #1, unsuccessful, unable to leave voice message.   Plan: RN CM will send unsuccessful outreach letter and follow up within the next 3-4 business days.  Valente David, [place], MSN [place] [phone]

## 2019-12-10 DIAGNOSIS — I4891 Unspecified atrial fibrillation: Secondary | ICD-10-CM | POA: Diagnosis not present

## 2019-12-10 DIAGNOSIS — D649 Anemia, unspecified: Secondary | ICD-10-CM | POA: Diagnosis not present

## 2019-12-10 DIAGNOSIS — I509 Heart failure, unspecified: Secondary | ICD-10-CM | POA: Diagnosis not present

## 2019-12-10 NOTE — Telephone Encounter (Signed)
Erin Schmidt called to confirm she received application renewal paperwork in mail. She plans to drop off completed application on Friday, 76/7/20.  Knox Saliva, PharmD, MPH Clinical Pharmacist (Rheumatology and Pulmonology)

## 2019-12-11 ENCOUNTER — Encounter: Payer: Self-pay | Admitting: *Deleted

## 2019-12-11 ENCOUNTER — Encounter: Payer: Self-pay | Admitting: Cardiology

## 2019-12-11 ENCOUNTER — Ambulatory Visit (INDEPENDENT_AMBULATORY_CARE_PROVIDER_SITE_OTHER): Payer: Medicare Other | Admitting: Cardiology

## 2019-12-11 VITALS — BP 108/62 | HR 103 | Ht 66.5 in | Wt 153.0 lb

## 2019-12-11 DIAGNOSIS — I4819 Other persistent atrial fibrillation: Secondary | ICD-10-CM | POA: Diagnosis not present

## 2019-12-11 DIAGNOSIS — I5033 Acute on chronic diastolic (congestive) heart failure: Secondary | ICD-10-CM

## 2019-12-11 MED ORDER — FUROSEMIDE 40 MG PO TABS: 40.0000 mg | ORAL_TABLET | Freq: Two times a day (BID) | ORAL | 3 refills | Status: DC

## 2019-12-11 MED ORDER — METOPROLOL TARTRATE 25 MG PO TABS: 12.5000 mg | ORAL_TABLET | Freq: Two times a day (BID) | ORAL | 1 refills | Status: DC

## 2019-12-11 NOTE — Progress Notes (Signed)
Clinical Summary Erin Schmidt is a 86 y.o.female seen today for follow up of the following medical problems  1. Afib - ER visit 09/2019, sent from outside clinic for new diagnosis of afib - was asymptomatic, unknown duration of afib - started on eliquis 5mg  bid. Discharged on oral diltiazem   - taking dilt 60mg  bid at home. SBPs 120-130s, HRs 100s-130s. Remains asymptomatic.   - had DCCV 11/10/19, reverted back to afib shortly after -presented later 11/2019 with afib with RVR.   - has had some recent palpitations.    2. Chronic diastolic HF - admission 24/4010 with afib with RVR and acute on chronic diastolic HF - diuresed with IV lasix 40mg  bid, started on oral lasix 10mg  daily at discharge.   Discharge weight 146 lbs, drifted up to 152 lbs today with increased LE edema and SOB - since Sunday taking 20mg  daily of lasix, not improving symptoms or weights.     3. Moderate mitral regurgitation  Past Medical History:  Diagnosis Date  . Arthritis   . Atrial fibrillation ([place])   . Dysrhythmia   . GERD (gastroesophageal reflux disease)   . Hyperlipemia   . Hypertension    preventative measures  . Hypothyroidism   . Iron deficiency anemia 05/07/2019  . Migraine   . Thyroid disease   . Vertigo      Allergies  Allergen Reactions  . Feraheme [Ferumoxytol]     Reacted during infusion requiring medical intervention. Increased BP     Current Outpatient Medications  Medication Sig Dispense Refill  . apixaban (ELIQUIS) 5 MG TABS tablet Take 1 tablet (5 mg total) by mouth 2 (two) times daily. 60 tablet 3  . Calcium Carbonate-Vitamin D (CALCIUM + D PO) Take 1 tablet by mouth 2 (two) times daily.     . cetirizine (ZYRTEC) 10 MG tablet Take 10 mg by mouth daily.    . Cholecalciferol (VITAMIN D3 PO) Take by mouth.    . diltiazem (CARDIZEM CD) 240 MG 24 hr capsule Take 1 capsule (240 mg total) by mouth daily. 30 capsule 3  . escitalopram (LEXAPRO) 10 MG tablet Take 10 mg by  mouth daily.    Marland Kitchen etanercept (ENBREL SURECLICK) 50 MG/ML injection Inject 50 mg into the skin once a week. 12 mL 0  . furosemide (LASIX) 20 MG tablet Take 0.5 tablets (10 mg total) by mouth daily. 30 tablet 2  . hydroxychloroquine (PLAQUENIL) 200 MG tablet TAKE 1 TABLET BY MOUTH TWICE A DAY (Patient taking differently: Take 200 mg by mouth 2 (two) times daily. ) 180 tablet 0  . levothyroxine (SYNTHROID, LEVOTHROID) 112 MCG tablet Take 112 mcg by mouth daily before breakfast.     . meclizine (ANTIVERT) 25 MG tablet Take 25 mg by mouth 3 (three) times daily as needed for dizziness.     . traMADol-acetaminophen (ULTRACET) 37.5-325 MG tablet Take 1 tablet by mouth in the morning and at bedtime.      No current facility-administered medications for this visit.     Past Surgical History:  Procedure Laterality Date  . APPENDECTOMY    . BREAST BIOPSY Right    benign  . BREAST EXCISIONAL BIOPSY Left    Benign  . BREAST EXCISIONAL BIOPSY Left    Benign  . BREAST LUMPECTOMY  30 yrs ago   Dr. Milbert Coulter- rt breast  . BREAST LUMPECTOMY  15 yrs ago   left- Dr. Romona Curls  . BREAST LUMPECTOMY  01/2011  . CARDIOVERSION  N/A 11/10/2019   Procedure: CARDIOVERSION;  Surgeon: Arnoldo Lenis, MD;  Location: [facility];  Service: Endoscopy;  Laterality: N/A;  . CHOLECYSTECTOMY    . COLONOSCOPY    . COLONOSCOPY  04/13/2011   Procedure: COLONOSCOPY;  Surgeon: Rogene Houston, MD;  Location: [facility];  Service: Endoscopy;  Laterality: N/A;  930  . MASTECTOMY, PARTIAL  01/13/2011   Procedure: MASTECTOMY PARTIAL;  Surgeon: Adin Hector, MD;  Location: [facility];  Service: General;  Laterality: Left;  left partial mastectomy with needle locallization  . VAGINAL HYSTERECTOMY     2 partials     Allergies  Allergen Reactions  . Feraheme [Ferumoxytol]     Reacted during infusion requiring medical intervention. Increased BP      Family History  Problem Relation Age of Onset  . Heart disease  Mother   . Stroke Sister   . Heart disease Brother   . Atrial fibrillation Sister   . Coronary artery disease Brother   . Lung cancer Son   . Healthy Son   . Colon cancer Neg Hx      Social History Ms. Kussman reports that she quit smoking about 30 years ago. Her smoking use included cigarettes. She has a 20.00 pack-year smoking history. She has never used smokeless tobacco. Ms. Lawhead reports no history of alcohol use.   Review of Systems CONSTITUTIONAL: No weight loss, fever, chills, weakness or fatigue.  HEENT: Eyes: No visual loss, blurred vision, double vision or yellow sclerae.No hearing loss, sneezing, congestion, runny nose or sore throat.  SKIN: No rash or itching.  CARDIOVASCULAR: per hpi RESPIRATORY: per hpi GASTROINTESTINAL: No anorexia, nausea, vomiting or diarrhea. No abdominal pain or blood.  GENITOURINARY: No burning on urination, no polyuria NEUROLOGICAL: No headache, dizziness, syncope, paralysis, ataxia, numbness or tingling in the extremities. No change in bowel or bladder control.  MUSCULOSKELETAL: No muscle, back pain, joint pain or stiffness.  LYMPHATICS: No enlarged nodes. No history of splenectomy.  PSYCHIATRIC: No history of depression or anxiety.  ENDOCRINOLOGIC: No reports of sweating, cold or heat intolerance. No polyuria or polydipsia.  Marland Kitchen   Physical Examination Today's Vitals   12/11/19 0838  BP: 108/62  Pulse: (!) 103  SpO2: 99%  Weight: 153 lb (69.4 kg)  Height: 5' 6.5" (1.689 m)   Body mass index is 24.32 kg/m.  Gen: resting comfortably, no acute distress HEENT: no scleral icterus, pupils equal round and reactive, no palptable cervical adenopathy,  CV: irreg, 2/6 systolic murmur apex. +JVD Resp: mild crackles bilateraly GI: abdomen is soft, non-tender, non-distended, normal bowel sounds, no hepatosplenomegaly MSK: extremities are warm, 1+ bilateral LE edema  Skin: warm, no rash Neuro:  no focal deficits Psych: appropriate  affect   Diagnostic Studies     Assessment and Plan  1. Afib -rates elevated today, add on lopressor 12.5mg  bid  2. Acute on chronic diastolic HF - discharge weight 146 lbs by home scale, she had no swelling and felt good at this weight - home scale up to 152 lbs, evidence of fluid overload with progressing SOB - increased lasix to 40mg  bid for now, she will call us Monday ot update [place] on weights. Get pcp labs she had yesterday, may need some KCl replacement pending labs.       Arnoldo Lenis, M.D.

## 2019-12-11 NOTE — Patient Instructions (Signed)
Your physician recommends that you schedule a follow-up appointment in: [place] physician has recommended you make the following change in your medication:   INCREASE LASIX 40 MG TWICE DAILY   START LOPRESSOR 12.5 MG (1/2 TABLET) TWICE DAILY   CALL us Monday WITH AN UPDATE ON WEIGHT AND SWELLING   Thank you for choosing [facility]!!

## 2019-12-12 ENCOUNTER — Other Ambulatory Visit: Payer: Self-pay | Admitting: *Deleted

## 2019-12-12 ENCOUNTER — Other Ambulatory Visit (HOSPITAL_COMMUNITY): Payer: Self-pay

## 2019-12-12 ENCOUNTER — Telehealth: Payer: Self-pay | Admitting: *Deleted

## 2019-12-12 DIAGNOSIS — M0609 Rheumatoid arthritis without rheumatoid factor, multiple sites: Secondary | ICD-10-CM

## 2019-12-12 DIAGNOSIS — D500 Iron deficiency anemia secondary to blood loss (chronic): Secondary | ICD-10-CM

## 2019-12-12 NOTE — Patient Outreach (Signed)
[facility]) Care Management  12/12/2019  Erin Schmidt 01/22/1932 [id number]   RED ON EMMI ALERT - General Discharge Day # 1 Date: 11/26 Red Alert Reason: Not scheduled follow up   Outreach attempt #2, unsuccessful, unable to leave voice message on listed home number.  HIPAA compliant voice message left on listed cell number.   Plan: RN CM will follow up within the next 3-4 business days.  Valente David, [place], MSN [place] [phone]

## 2019-12-12 NOTE — Telephone Encounter (Signed)
-----   Message from Arnoldo Lenis, MD sent at 12/12/2019 10:11 AM EST ----- Labs from pcp reviewed, renal function and potassium levels were normal. Continue our plan with the diuretic change over the weekend   Zandra Abts MD

## 2019-12-12 NOTE — Telephone Encounter (Signed)
Left detailed message on VM.

## 2019-12-15 ENCOUNTER — Other Ambulatory Visit: Payer: Self-pay

## 2019-12-15 ENCOUNTER — Telehealth: Payer: Self-pay | Admitting: Cardiology

## 2019-12-15 ENCOUNTER — Inpatient Hospital Stay (HOSPITAL_COMMUNITY): Payer: Medicare Other | Attending: Hematology and Oncology

## 2019-12-15 DIAGNOSIS — D509 Iron deficiency anemia, unspecified: Secondary | ICD-10-CM | POA: Insufficient documentation

## 2019-12-15 DIAGNOSIS — D500 Iron deficiency anemia secondary to blood loss (chronic): Secondary | ICD-10-CM

## 2019-12-15 LAB — IRON AND TIBC
Iron: 13 ug/dL — ABNORMAL LOW (ref 28–170)
Saturation Ratios: 3 % — ABNORMAL LOW (ref 10.4–31.8)
TIBC: 513 ug/dL — ABNORMAL HIGH (ref 250–450)
UIBC: 500 ug/dL

## 2019-12-15 LAB — COMPREHENSIVE METABOLIC PANEL
ALT: 18 U/L (ref 0–44)
AST: 23 U/L (ref 15–41)
Albumin: 3.8 g/dL (ref 3.5–5.0)
Alkaline Phosphatase: 63 U/L (ref 38–126)
Anion gap: 10 (ref 5–15)
BUN: 33 mg/dL — ABNORMAL HIGH (ref 8–23)
CO2: 28 mmol/L (ref 22–32)
Calcium: 8.7 mg/dL — ABNORMAL LOW (ref 8.9–10.3)
Chloride: 99 mmol/L (ref 98–111)
Creatinine, Ser: 1.21 mg/dL — ABNORMAL HIGH (ref 0.44–1.00)
GFR, Estimated: 44 mL/min — ABNORMAL LOW (ref >60)
Glucose, Bld: 110 mg/dL — ABNORMAL HIGH (ref 70–99)
Potassium: 2.8 mmol/L — ABNORMAL LOW (ref 3.5–5.1)
Sodium: 137 mmol/L (ref 135–145)
Total Bilirubin: 0.8 mg/dL (ref 0.3–1.2)
Total Protein: 6.7 g/dL (ref 6.5–8.1)

## 2019-12-15 LAB — CBC WITH DIFFERENTIAL/PLATELET
Abs Immature Granulocytes: 0.02 10*3/uL (ref 0.00–0.07)
Basophils Absolute: 0.1 10*3/uL (ref 0.0–0.1)
Basophils Relative: 1 %
Eosinophils Absolute: 0.1 10*3/uL (ref 0.0–0.5)
Eosinophils Relative: 1 %
HCT: 26.7 % — ABNORMAL LOW (ref 36.0–46.0)
Hemoglobin: 8.1 g/dL — ABNORMAL LOW (ref 12.0–15.0)
Immature Granulocytes: 0 %
Lymphocytes Relative: 15 %
Lymphs Abs: 1.1 10*3/uL (ref 0.7–4.0)
MCH: 25.6 pg — ABNORMAL LOW (ref 26.0–34.0)
MCHC: 30.3 g/dL (ref 30.0–36.0)
MCV: 84.2 fL (ref 80.0–100.0)
Monocytes Absolute: 0.8 10*3/uL (ref 0.1–1.0)
Monocytes Relative: 11 %
Neutro Abs: 5.1 10*3/uL (ref 1.7–7.7)
Neutrophils Relative %: 72 %
Platelets: 300 10*3/uL (ref 150–400)
RBC: 3.17 MIL/uL — ABNORMAL LOW (ref 3.87–5.11)
RDW: 16.7 % — ABNORMAL HIGH (ref 11.5–15.5)
WBC: 7.1 10*3/uL (ref 4.0–10.5)
nRBC: 0 % (ref 0.0–0.2)

## 2019-12-15 LAB — VITAMIN B12: Vitamin B-12: 425 pg/mL (ref 180–914)

## 2019-12-15 LAB — VITAMIN D 25 HYDROXY (VIT D DEFICIENCY, FRACTURES): Vit D, 25-Hydroxy: 38.87 ng/mL (ref 30–100)

## 2019-12-15 LAB — LACTATE DEHYDROGENASE: LDH: 183 U/L (ref 98–192)

## 2019-12-15 LAB — FERRITIN: Ferritin: 5 ng/mL — ABNORMAL LOW (ref 11–307)

## 2019-12-15 NOTE — Telephone Encounter (Signed)
Pt f/u lasix 40 mg bid - weight today is 151lbs and swelling hasn't resolved and only improved slightly still has SOB when trying to do normal activities - HR over the last few days 103 79 88 94 103 - taking cardizem 240 mg daily - has appt with Bernerd Pho, [place] 12/8

## 2019-12-15 NOTE — Telephone Encounter (Signed)
Pt is calling to give up date on med changes for Dr. Harl Bowie

## 2019-12-15 NOTE — Telephone Encounter (Signed)
Submitted completed Patient Assistance Application renewal to [employer] for ENBREL (patient and provider portions). Will update patient when we receive a response.  Fax# [phone] Phone# [phone]

## 2019-12-16 ENCOUNTER — Other Ambulatory Visit: Payer: Self-pay

## 2019-12-16 ENCOUNTER — Inpatient Hospital Stay (HOSPITAL_BASED_OUTPATIENT_CLINIC_OR_DEPARTMENT_OTHER): Payer: Medicare Other | Admitting: Oncology

## 2019-12-16 DIAGNOSIS — E876 Hypokalemia: Secondary | ICD-10-CM | POA: Diagnosis not present

## 2019-12-16 DIAGNOSIS — D500 Iron deficiency anemia secondary to blood loss (chronic): Secondary | ICD-10-CM | POA: Diagnosis not present

## 2019-12-16 MED ORDER — POTASSIUM CHLORIDE CRYS ER 20 MEQ PO TBCR: 20.0000 meq | EXTENDED_RELEASE_TABLET | Freq: Two times a day (BID) | ORAL | 2 refills | Status: DC

## 2019-12-16 NOTE — Telephone Encounter (Signed)
Increase lasix to 60mg  bid, eval tomorrow with Belinda Block MD

## 2019-12-16 NOTE — Progress Notes (Signed)
Erin Schmidt, [place] [id number]   CLINIC:  Medical Oncology/Hematology  PCP:  Sharilyn Sites, [place] [address] [phone]   REASON FOR VISIT: Follow-up for severe iron deficiency anemia   CURRENT THERAPY: Intermittent iron infusions  I connected with [name] on 12/16/19 at  3:00 PM EST by telephone visit and verified that I am speaking with the correct person using two identifiers.   I discussed the limitations, risks, security and privacy concerns of performing an evaluation and management service by telemedicine and the availability of in-person appointments. I also discussed with the patient that there may be a patient responsible charge related to this service. The patient expressed understanding and agreed to proceed.   Other persons participating in the visit and their role in the encounter: None  Patient's location: Home Provider's location: Clinic  INTERVAL HISTORY:  Erin Schmidt 86 y.o. female returns for routine follow-up for severe iron deficiency anemia.   She was last evaluated by hematology on 09/11/2019.  She received IV Injectafer last in July 2021.  She was evaluated at [facility] on 09/25/2019 for symptomatic atrial fibrillation.  She was started on anticoagulation with Eliquis and diltiazem for rate control.  She had a cardioversion performed on 11/10/2019.  She was evaluated again at [facility] on 12/01/2019-12/04/2019 for new onset heart failure.  She was diuresed and sent home on Lasix.  Had 2D echo which showed an EF of 50 to 29% with no diastolic dysfunction.  She reports feeling poorly since her last visit.  She has follow-up with cardiology tomorrow.  She denies any active bleeding.  She reports intermittent shortness of breath and severe fatigue.  She is urinating a lot secondary to diuretics.  Her Lasix was recently increased from 10 mg to 40 mg secondary to lower extremity swelling.Had not noticed  any recent bleeding such as epistaxis, hematuria or hematochezia. Denies recent chest pain on exertion, shortness of breath on minimal exertion, pre-syncopal episodes, or palpitations. Denies any numbness or tingling in hands or feet. Denies any recent fevers, infections. Patient reports appetite at 50% and energy level at 50%.  She is eating well maintain her weight at this time.   REVIEW OF SYSTEMS:  Review of Systems  Constitutional: Positive for fatigue and unexpected weight change.  Respiratory: Positive for shortness of breath.   Cardiovascular: Positive for leg swelling and palpitations.  Musculoskeletal: Positive for gait problem.  Neurological: Positive for dizziness and gait problem.  All other systems reviewed and are negative.    PAST MEDICAL/SURGICAL HISTORY:  Past Medical History:  Diagnosis Date  . Arthritis   . Atrial fibrillation ([place])   . Dysrhythmia   . GERD (gastroesophageal reflux disease)   . Hyperlipemia   . Hypertension    preventative measures  . Hypothyroidism   . Iron deficiency anemia 05/07/2019  . Migraine   . Thyroid disease   . Vertigo    Past Surgical History:  Procedure Laterality Date  . APPENDECTOMY    . BREAST BIOPSY Right    benign  . BREAST EXCISIONAL BIOPSY Left    Benign  . BREAST EXCISIONAL BIOPSY Left    Benign  . BREAST LUMPECTOMY  30 yrs ago   Dr. Milbert Coulter- rt breast  . BREAST LUMPECTOMY  15 yrs ago   left- Dr. Romona Curls  . BREAST LUMPECTOMY  01/2011  . CARDIOVERSION N/A 11/10/2019   Procedure: CARDIOVERSION;  Surgeon: Arnoldo Lenis, MD;  Location: [facility];  Service: Endoscopy;  Laterality: N/A;  . CHOLECYSTECTOMY    . COLONOSCOPY    . COLONOSCOPY  04/13/2011   Procedure: COLONOSCOPY;  Surgeon: Rogene Houston, MD;  Location: [facility];  Service: Endoscopy;  Laterality: N/A;  930  . MASTECTOMY, PARTIAL  01/13/2011   Procedure: MASTECTOMY PARTIAL;  Surgeon: Adin Hector, MD;  Location: [facility];  Service:  General;  Laterality: Left;  left partial mastectomy with needle locallization  . VAGINAL HYSTERECTOMY     2 partials     SOCIAL HISTORY:  Social History   Socioeconomic History  . Marital status: Married    Spouse name: Not on file  . Number of children: 2  . Years of education: Not on file  . Highest education level: Not on file  Occupational History  . Occupation: RETIRED  Tobacco Use  . Smoking status: Former Smoker    Packs/day: 1.00    Years: 20.00    Pack years: 20.00    Types: Cigarettes    Quit date: 01/05/1989    Years since quitting: 30.9  . Smokeless tobacco: Never Used  Vaping Use  . Vaping Use: Never used  Substance and Sexual Activity  . Alcohol use: No  . Drug use: Never  . Sexual activity: Not on file  Other Topics Concern  . Not on file  Social History Narrative  . Not on file   Social Determinants of Health   Financial Resource Strain:   . Difficulty of Paying Living Expenses: Not on file  Food Insecurity:   . Worried About Charity fundraiser in the Last Year: Not on file  . Ran Out of Food in the Last Year: Not on file  Transportation Needs:   . Lack of Transportation (Medical): Not on file  . Lack of Transportation (Non-Medical): Not on file  Physical Activity:   . Days of Exercise per Week: Not on file  . Minutes of Exercise per Session: Not on file  Stress:   . Feeling of Stress : Not on file  Social Connections:   . Frequency of Communication with Friends and Family: Not on file  . Frequency of Social Gatherings with Friends and Family: Not on file  . Attends Religious Services: Not on file  . Active Member of Clubs or Organizations: Not on file  . Attends Archivist Meetings: Not on file  . Marital Status: Not on file  Intimate Partner Violence:   . Fear of Current or Ex-Partner: Not on file  . Emotionally Abused: Not on file  . Physically Abused: Not on file  . Sexually Abused: Not on file    FAMILY HISTORY:   Family History  Problem Relation Age of Onset  . Heart disease Mother   . Stroke Sister   . Heart disease Brother   . Atrial fibrillation Sister   . Coronary artery disease Brother   . Lung cancer Son   . Healthy Son   . Colon cancer Neg Hx     CURRENT MEDICATIONS:  Outpatient Encounter Medications as of 12/16/2019  Medication Sig Note  . apixaban (ELIQUIS) 5 MG TABS tablet Take 1 tablet (5 mg total) by mouth 2 (two) times daily.   . Calcium Carbonate-Vitamin D (CALCIUM + D PO) Take 1 tablet by mouth 2 (two) times daily.    . cetirizine (ZYRTEC) 10 MG tablet Take 10 mg by mouth daily.   . Cholecalciferol (VITAMIN D3 PO)  Take by mouth.   . diltiazem (CARDIZEM CD) 240 MG 24 hr capsule Take 1 capsule (240 mg total) by mouth daily.   Marland Kitchen escitalopram (LEXAPRO) 10 MG tablet Take 10 mg by mouth daily.   Marland Kitchen etanercept (ENBREL SURECLICK) 50 MG/ML injection Inject 50 mg into the skin once a week. 12/02/2019: Did not get this weeks dose due to hospital visit  . furosemide (LASIX) 40 MG tablet Take 1 tablet (40 mg total) by mouth 2 (two) times daily.   . hydroxychloroquine (PLAQUENIL) 200 MG tablet TAKE 1 TABLET BY MOUTH TWICE A DAY (Patient taking differently: Take 200 mg by mouth 2 (two) times daily. )   . levothyroxine (SYNTHROID, LEVOTHROID) 112 MCG tablet Take 112 mcg by mouth daily before breakfast.    . meclizine (ANTIVERT) 25 MG tablet Take 25 mg by mouth 3 (three) times daily as needed for dizziness.    . metoprolol tartrate (LOPRESSOR) 25 MG tablet Take 0.5 tablets (12.5 mg total) by mouth 2 (two) times daily.   . potassium chloride SA (KLOR-CON) 20 MEQ tablet Take 1 tablet (20 mEq total) by mouth 2 (two) times daily.   . traMADol-acetaminophen (ULTRACET) 37.5-325 MG tablet Take 1 tablet by mouth in the morning and at bedtime.  11/05/2019: Patient takes with plaquenil doses   No facility-administered encounter medications on file as of 12/16/2019.    ALLERGIES:  Allergies  Allergen  Reactions  . Feraheme [Ferumoxytol]     Reacted during infusion requiring medical intervention. Increased BP     PHYSICAL EXAM:  ECOG Performance status: 1  There were no vitals filed for this visit. There were no vitals filed for this visit. Physical Exam -Limited secondary to telephone visit. -Patient is not short of breath and able to complete full sentences.  LABORATORY DATA:  I have reviewed the labs as listed.  CBC    Component Value Date/Time   WBC 7.1 12/15/2019 1107   RBC 3.17 (L) 12/15/2019 1107   HGB 8.1 (L) 12/15/2019 1107   HCT 26.7 (L) 12/15/2019 1107   PLT 300 12/15/2019 1107   MCV 84.2 12/15/2019 1107   MCH 25.6 (L) 12/15/2019 1107   MCHC 30.3 12/15/2019 1107   RDW 16.7 (H) 12/15/2019 1107   LYMPHSABS 1.1 12/15/2019 1107   MONOABS 0.8 12/15/2019 1107   EOSABS 0.1 12/15/2019 1107   BASOSABS 0.1 12/15/2019 1107   CMP Latest Ref Rng & Units 12/15/2019 12/03/2019 12/02/2019  Glucose 70 - 99 mg/dL 110(H) 99 100(H)  BUN 8 - 23 mg/dL 33(H) 20 18  Creatinine 0.44 - 1.00 mg/dL 1.21(H) 1.04(H) 0.99  Sodium 135 - 145 mmol/L 137 138 136  Potassium 3.5 - 5.1 mmol/L 2.8(L) 3.3(L) 3.0(L)  Chloride 98 - 111 mmol/L 99 99 100  CO2 22 - 32 mmol/L _0 Calcium 8.9 - 10.3 mg/dL 8.7(L) 8.1(L) 8.0(L)  Total Protein 6.5 - 8.1 g/dL 6.7 - -  Total Bilirubin 0.3 - 1.2 mg/dL 0.8 - -  Alkaline Phos 38 - 126 U/L 63 - -  AST 15 - 41 U/L 23 - -  ALT 0 - 44 U/L 18 - -    All questions were answered to patient's stated satisfaction. Encouraged patient to call with any new concerns or questions before his next visit to the cancer center and we can certain see him sooner, if needed.     ASSESSMENT & PLAN:  1.  Severe iron deficiency anemia: -Hemoglobin has consistently been low around 10 since  October 2020. -Colonoscopy within the last 10 years by Dr. Laural Golden, apparently normal. -Patient denies any bleeding per rectum or melena.  No prior history of blood  transfusion. -Work-up labs included B12, folate, copper, and multiple myeloma screening all normal.  Positive stool cards in 05/29/2019. -She cannot tolerate oral iron because of gastric symptoms. -She unfortunately had a reaction to her second Feraheme dose and was switched to IV Injectafer.  Tolerated Injectafer well. -Lab work from 12/15/2019 show ferritin of 5, hemoglobin of 8.1 and iron saturations of 3%. -Recommend IV Injectafer ASAP. -Patient would like to speak with her cardiologist tomorrow and get back to [place] on getting scheduled for her IV iron.  2.  Rheumatoid arthritis: -She is on Enbrel weekly in the last 2 years along with Plaquenil twice daily. -Arthritis is fairly well controlled on the current regimen.  Predominantly involves hands.  3. Atrial Fibrillation/congestive heart failure: -Was admitted to the hospital from 12/01/19-12/03/2019 for shortness of breath and lower extremity swelling. -She had recently been started on diltiazem and Eliquis. -Check 2D echo revealing an EF of 50 to 99% and no diastolic dysfunction. -She was discharged on 10 mg of Lasix daily. -Arrange for cardiology outpatient. -Lasix recently increased from 10 mg to 40 mg. -Potassium level today is 2.8. -Recommend starting 20 mEq potassium tablets twice daily -Would recommend follow-up BMP in 1 month.  Disposition: -Return to clinic ASAP for IV Injectafer x2 doses. -Return to clinic 1 month for repeat labs including a CBC and CMP to check her potassium level. -Prescription sent for potassium 20 mEq twice daily.   I provided 20  minutes of non face-to-face telephone visit time during this encounter, and > 50% was spent counseling as documented under my assessment & plan.   No problem-specific Assessment & Plan notes found for this encounter.  Orders placed this encounter:  No orders of the defined types were placed in this encounter.  Faythe Casa, NP 12/16/2019 4:02 PM  [place] [phone]

## 2019-12-16 NOTE — Telephone Encounter (Signed)
Pt aware - recent labs by hemotologist pottassium was 2.8 and started KCL 20 meq bid - will keep f/u with PA as scheduled tomorrow

## 2019-12-17 ENCOUNTER — Encounter: Payer: Self-pay | Admitting: Student

## 2019-12-17 ENCOUNTER — Encounter: Payer: Self-pay | Admitting: *Deleted

## 2019-12-17 ENCOUNTER — Other Ambulatory Visit: Payer: Self-pay | Admitting: *Deleted

## 2019-12-17 ENCOUNTER — Ambulatory Visit: Payer: Medicare Other | Admitting: Student

## 2019-12-17 VITALS — BP 110/60 | HR 98 | Ht 66.5 in | Wt 154.0 lb

## 2019-12-17 DIAGNOSIS — D509 Iron deficiency anemia, unspecified: Secondary | ICD-10-CM | POA: Diagnosis not present

## 2019-12-17 DIAGNOSIS — I5033 Acute on chronic diastolic (congestive) heart failure: Secondary | ICD-10-CM

## 2019-12-17 DIAGNOSIS — I4819 Other persistent atrial fibrillation: Secondary | ICD-10-CM

## 2019-12-17 DIAGNOSIS — I340 Nonrheumatic mitral (valve) insufficiency: Secondary | ICD-10-CM

## 2019-12-17 MED ORDER — TORSEMIDE 20 MG PO TABS: 40.0000 mg | ORAL_TABLET | Freq: Two times a day (BID) | ORAL | 11 refills | Status: DC

## 2019-12-17 NOTE — Progress Notes (Signed)
Cardiology Office Note    Date:  12/17/2019   ID:  Erin Schmidt, DOB [birth year], MRN [id number]  PCP:  Sharilyn Sites, MD  Cardiologist: Carlyle Dolly, MD    Chief Complaint  Patient presents with  . Follow-up    1 week visit    History of Present Illness:    Erin Schmidt is a 86 y.o. female with past medical historyof persistent atrial fibrillation(s/p DCCV on 11/10/2019 with recurrence at follow-up --> rate-control pursued given severe LA dilation), moderate MR, HTN, hypothyroidism and iron deficiency anemia who presents to the office today for 1-week follow-up.   She was recently admitted to [facility] from 11/22 - 12/03/2019 for an acute on chronic diastolic CHF exacerbation in the setting of atrial fibrillation with RVR. Cardizem CD was increased from 180mg  daily to 240mg  daily for rate-control. She responded well to IV Lasix with a recorded output of -3.2L and was discharged on Lasix 10mg  daily.   She did follow-up with Dr. Harl Bowie on 12/11/2019 and weight had increased from 146 lbs at the time of discharge to 152 lbs and she reported associated dyspnea and edema. Lasix was temporarily increased to 40mg  BID and Lopressor 12.5mg  BID wad added for additional rate-control. She called to report back on symptoms on 12/15/2019 and weight was at 151 lbs and symptoms had not improved. Was recommended to increase Lasix to 60mg  BID. Labs that same day did show creatinine was at 1.21 and K+ was low at 2.8 with supplementation initiated.   In talking with the patient today, she reports taking Lasix 60mg  for two doses and has not noticed a change in symptoms. Weight actually increased by an additional 1 lb on her home scales since yesterday. She reports worsening edema along with orthopnea and dyspnea on exertion. No chest pain. Does report brief palpitations at night.     Past Medical History:  Diagnosis Date  . Arthritis   . Atrial fibrillation ([place])   . Dysrhythmia   . GERD  (gastroesophageal reflux disease)   . Hyperlipemia   . Hypertension    preventative measures  . Hypothyroidism   . Iron deficiency anemia 05/07/2019  . Migraine   . Thyroid disease   . Vertigo     Past Surgical History:  Procedure Laterality Date  . APPENDECTOMY    . BREAST BIOPSY Right    benign  . BREAST EXCISIONAL BIOPSY Left    Benign  . BREAST EXCISIONAL BIOPSY Left    Benign  . BREAST LUMPECTOMY  30 yrs ago   Dr. Milbert Coulter- rt breast  . BREAST LUMPECTOMY  15 yrs ago   left- Dr. Romona Curls  . BREAST LUMPECTOMY  01/2011  . CARDIOVERSION N/A 11/10/2019   Procedure: CARDIOVERSION;  Surgeon: Arnoldo Lenis, MD;  Location: [facility] ORS;  Service: Endoscopy;  Laterality: N/A;  . CHOLECYSTECTOMY    . COLONOSCOPY    . COLONOSCOPY  04/13/2011   Procedure: COLONOSCOPY;  Surgeon: Rogene Houston, MD;  Location: [facility]ITE;  Service: Endoscopy;  Laterality: N/A;  930  . MASTECTOMY, PARTIAL  01/13/2011   Procedure: MASTECTOMY PARTIAL;  Surgeon: Adin Hector, MD;  Location: [facility];  Service: General;  Laterality: Left;  left partial mastectomy with needle locallization  . VAGINAL HYSTERECTOMY     2 partials    Current Medications: Outpatient Medications Prior to Visit  Medication Sig Dispense Refill  . apixaban (ELIQUIS) 5 MG TABS tablet Take 1 tablet (5 mg  total) by mouth 2 (two) times daily. 60 tablet 3  . Calcium Carbonate-Vitamin D (CALCIUM + D PO) Take 1 tablet by mouth 2 (two) times daily.     . cetirizine (ZYRTEC) 10 MG tablet Take 10 mg by mouth daily.    . Cholecalciferol (VITAMIN D3 PO) Take by mouth.    . diltiazem (CARDIZEM CD) 240 MG 24 hr capsule Take 1 capsule (240 mg total) by mouth daily. 30 capsule 3  . escitalopram (LEXAPRO) 10 MG tablet Take 10 mg by mouth daily.    Marland Kitchen etanercept (ENBREL SURECLICK) 50 MG/ML injection Inject 50 mg into the skin once a week. 12 mL 0  . hydroxychloroquine (PLAQUENIL) 200 MG tablet TAKE 1 TABLET BY MOUTH TWICE A DAY  (Patient taking differently: Take 200 mg by mouth 2 (two) times daily. ) 180 tablet 0  . levothyroxine (SYNTHROID, LEVOTHROID) 112 MCG tablet Take 112 mcg by mouth daily before breakfast.     . meclizine (ANTIVERT) 25 MG tablet Take 25 mg by mouth 3 (three) times daily as needed for dizziness.     . metoprolol tartrate (LOPRESSOR) 25 MG tablet Take 0.5 tablets (12.5 mg total) by mouth 2 (two) times daily. 90 tablet 1  . potassium chloride SA (KLOR-CON) 20 MEQ tablet Take 1 tablet (20 mEq total) by mouth 2 (two) times daily. 90 tablet 2  . traMADol-acetaminophen (ULTRACET) 37.5-325 MG tablet Take 1 tablet by mouth in the morning and at bedtime.     . furosemide (LASIX) 40 MG tablet Take 1 tablet (40 mg total) by mouth 2 (two) times daily. 60 tablet 3   No facility-administered medications prior to visit.     Allergies:   Feraheme [ferumoxytol]   Social History   Socioeconomic History  . Marital status: Married    Spouse name: Not on file  . Number of children: 2  . Years of education: Not on file  . Highest education level: Not on file  Occupational History  . Occupation: RETIRED  Tobacco Use  . Smoking status: Former Smoker    Packs/day: 1.00    Years: 20.00    Pack years: 20.00    Types: Cigarettes    Quit date: 01/05/1989    Years since quitting: 30.9  . Smokeless tobacco: Never Used  Vaping Use  . Vaping Use: Never used  Substance and Sexual Activity  . Alcohol use: No  . Drug use: Never  . Sexual activity: Not on file  Other Topics Concern  . Not on file  Social History Narrative  . Not on file   Social Determinants of Health   Financial Resource Strain:   . Difficulty of Paying Living Expenses: Not on file  Food Insecurity: No Food Insecurity  . Worried About Charity fundraiser in the Last Year: Never true  . Ran Out of Food in the Last Year: Never true  Transportation Needs: No Transportation Needs  . Lack of Transportation (Medical): No  . Lack of  Transportation (Non-Medical): No  Physical Activity:   . Days of Exercise per Week: Not on file  . Minutes of Exercise per Session: Not on file  Stress:   . Feeling of Stress : Not on file  Social Connections:   . Frequency of Communication with Friends and Family: Not on file  . Frequency of Social Gatherings with Friends and Family: Not on file  . Attends Religious Services: Not on file  . Active Member of Clubs or Organizations: Not  on file  . Attends Archivist Meetings: Not on file  . Marital Status: Not on file     Family History:  The patient's family history includes Atrial fibrillation in her sister; Coronary artery disease in her brother; Healthy in her son; Heart disease in her brother and mother; Lung cancer in her son; Stroke in her sister.   Review of Systems:   Please see the history of present illness.     General:  No chills, fever, night sweats or weight changes.  Cardiovascular:  No chest pain. Positive for orthopnea, dyspnea on exertion and edema.  Dermatological: No rash, lesions/masses Respiratory: No cough, dyspnea Urologic: No hematuria, dysuria Abdominal:   No nausea, vomiting, diarrhea, bright red blood per rectum, melena, or hematemesis Neurologic:  No visual changes, wkns, changes in mental status. All other systems reviewed and are otherwise negative except as noted above.   Physical Exam:    VS:  BP 110/60   Pulse 98   Ht 5' 6.5" (1.689 m)   Wt 154 lb (69.9 kg)   SpO2 98%   BMI 24.48 kg/m    General: Well developed, well nourished,female appearing in no acute distress. Head: Normocephalic, atraumatic. Neck: No carotid bruits. JVD not elevated.  Lungs: Respirations regular and unlabored, mild rales along right base. Heart: Irregularly irregular. No S3 or S4.  No murmur, no rubs, or gallops appreciated. Abdomen: Appears non-distended. No obvious abdominal masses. Msk:  Strength and tone appear normal for age. No obvious joint  deformities or effusions. Extremities: No clubbing or cyanosis. 2+ pitting edema bilaterally.  Distal pedal pulses are 2+ bilaterally. Neuro: Alert and oriented X 3. Moves all extremities spontaneously. No focal deficits noted. Psych:  Responds to questions appropriately with a normal affect. Skin: No rashes or lesions noted  Wt Readings from Last 3 Encounters:  12/17/19 154 lb (69.9 kg)  12/11/19 153 lb (69.4 kg)  12/03/19 152 lb 5.4 oz (69.1 kg)     Studies/Labs Reviewed:   EKG:  EKG is not ordered today.    Recent Labs: 12/01/2019: B Natriuretic Peptide 701.0; TSH 3.486 12/03/2019: Magnesium 2.1 12/15/2019: ALT 18; BUN 33; Creatinine, Ser 1.21; Hemoglobin 8.1; Platelets 300; Potassium 2.8; Sodium 137   Lipid Panel No results found for: CHOL, TRIG, HDL, CHOLHDL, VLDL, LDLCALC, LDLDIRECT  Additional studies/ records that were reviewed today include:   Echocardiogram: 10/16/2019 IMPRESSIONS    1. Left ventricular ejection fraction, by estimation, is 50 to 55%. The  left ventricle has normal function. The left ventricle has no regional  wall motion abnormalities. Left ventricular diastolic parameters are  indeterminate.  2. Right ventricular systolic function is normal. The right ventricular  size is normal. There is mildly elevated pulmonary artery systolic  pressure.  3. Left atrial size was severely dilated.  4. Right atrial size was moderately dilated.  5. The mitral valve is normal in structure. Moderate mitral valve  regurgitation. No evidence of mitral stenosis.  6. Tricuspid valve regurgitation is mild to moderate.  7. The aortic valve is tricuspid. Aortic valve regurgitation is not  visualized. No aortic stenosis is present.  8. The inferior vena cava is dilated in size with <50% respiratory  variability, suggesting right atrial pressure of 15 mmHg.   Assessment:    1. Acute on chronic diastolic heart failure (HCC)   2. Persistent atrial fibrillation  ([place])   3. Mitral valve insufficiency, unspecified etiology   4. Iron deficiency anemia, unspecified iron deficiency anemia type  Plan:   In order of problems listed above:  1. Acute on Chronic Diastolic CHF Exacerbation - She continues to have progressive dyspnea on exertion, edema and orthopnea despite aggressive titration of Lasix since her recent hospital discharge (currently on 60mg  BID) and weight continues to increase. Reviewed with Dr. Harl Bowie and will plan to stop Lasix and switch to Torsemide 40mg  BID while continuing K-dur 20 mEq BID. I have asked her to call back on Friday and report on her symptoms and weight (was at 153.2 lbs on her home scales today). Will plan for a repeat BMET next Monday or Tuesday for reassessment of K+ and renal function.   2. Persistent Atrial Fibrillation - Previously underwent DCCV with quick return of atrial fibrillation and rate-control has since been pursued. Rates have been in the 70's to low-100's when checked at home. HR is in the 80's during today's visit. Will continue Cardizem CD 240mg  daily and Lopressor 12.5mg  BID. Unable to further titrate at this time as SBP has been in the 90's to low-100's at home.  - Remains on Eliquis 5mg  BID for anticoagulation. No reports of active bleeding but she is anemic which is followed by Hematology.   3. Mitral Regurgitation - Moderate by echocardiogram in 10/2019. Will continue to follow.   4. Anemia - Followed by Hematology. Hgb was at 8.1 on 12/15/2019. She was encouraged to continue with her iron infusions.    Medication Adjustments/Labs and Tests Ordered: Current medicines are reviewed at length with the patient today.  Concerns regarding medicines are outlined above.  Medication changes, Labs and Tests ordered today are listed in the Patient Instructions below. Patient Instructions  Medication Instructions:  Your physician has recommended you make the following change in your medication:  1. STOP  FUROSEMIDE   2. START TORSEMIDE  40 MG TWICE DAILY   *If you need a refill on your cardiac medications before your next appointment, please call your pharmacy*   Lab Work: TO BE DONE ON Monday OR Tuesday: BMET If you have labs (blood work) drawn today and your tests are completely normal, you will receive your results only by: Marland Kitchen MyChart Message (if you have MyChart) OR . A paper copy in the mail If you have any lab test that is abnormal or we need to change your treatment, we will call you to review the results.   Testing/Procedures: NONE   Follow-Up: At [facility], you and your health needs are our priority.  As part of our continuing mission to provide you with exceptional heart care, we have created designated Provider Care Teams.  These Care Teams include your primary Cardiologist (physician) and Advanced Practice Providers (APPs -  Physician Assistants and Nurse Practitioners) who all work together to provide you with the care you need, when you need it.  We recommend signing up for the patient portal called "MyChart".  Sign up information is provided on this After Visit Summary.  MyChart is used to connect with patients for Virtual Visits (Telemedicine).  Patients are able to view lab/test results, encounter notes, upcoming appointments, etc.  Non-urgent messages can be sent to your provider as well.   To learn more about what you can do with MyChart, go to [email or web address].    Your next appointment:   Your physician recommends that you keep your scheduled follow-up appointment with Bernerd Pho, PA-C   Other Instructions   *PLEASE [place] ON Friday 12/10 TO GIVE YOUR RECORDED WEIGHT  Signed, Erma Heritage, PA-C  12/17/2019 7:08 PM    [facility] [address] [place], [place] [address] Phone: [phone] Fax: [phone]

## 2019-12-17 NOTE — Patient Outreach (Signed)
[facility]) Care Management  12/17/2019  Erin Schmidt Feb 26, 1932 [id number]   RED ON EMMI ALERT- General Discharge Day #1 Date:11/26 Red Alert Reason:Not scheduled follow up  Outreach attempt #3, successful.  Identity verified.  This care manager introduced self and stated purpose of call.  [facility] care management services explained.    Social: Member lives with husband who helps with household chores (cooking, cleaning, etc).  State she is independent in all ADL/IADL's, although she has been experiencing some fatigue since being diagnosed with A-fib and heart failure.  State her husband went through diagnosis of the same conditions just a year ago, verbalizes understanding of management (low sodium diet, following heart failure zones, medication adherence).  Conditions: Per chart, has history of heart failure, A-fib, hypothyroidism, rheumatoid arthritis, and anemia.  Monitors weight, blood pressure, heart rate, and oxygen levels daily.  Weight over the past week has ranged from 150.8 pounds to 153.2 pounds today.  HR has ranged from 79-103, denies palpitations or chest pain.  Medications: Reviewed with member, report adherence, denies need for financial assistance.  State when she was initially discharged, was placed on 20 mg of Torsemide, now taking 60 mg twice a day.  Discussed times of administration in effort to decrease frequent urination during the night.  Still has lower extremity swelling despite increase of diuretic.  Appointments:  Although above EMMI alert was for no follow up appointment, she was seen by cardiologist on 12/2, will follow up again this afternoon.  Husband will provide transportation.  Denies any urgent concerns, encouraged to contact this care manager with question.    Plan: RN CM will send member EMMI education regarding HF and A-fib management.  Agrees to follow up within the next 2 wees.  Goals Addressed            This Visit's  Progress   . [facility] - Track and Manage Activity and Exertion-Heart Failure       Timeframe:  Long-Range Goal Priority:  Medium Start Date:     12/8                        Expected End Date:       02/17/2020             - avoid heavy exercise on very hot days - drink water to stay hydrated during exercise - follow activity or exercise plan - make an activity or exercise plan - track exercise in diary for two weeks - track symptoms during activity in diary    Why is this important?    Exercising is very important when managing your heart failure.   It will help your heart get stronger.    Notes:     . [facility] - Track and Manage Fluids and Swelling-Heart Failure       Timeframe:  Short-Term Goal Priority:  High Start Date:     12/8                        Expected End Date:       01/17/2020                   - call office if I gain more than 2 pounds in one day or 5 pounds in one week - keep legs up while sitting - track weight in diary - use salt in moderation - watch for swelling in feet, ankles  and legs every day - weigh myself daily    Why is this important?    It is important to check your weight daily and watch how much salt and liquids you have.   It will help you to manage your heart failure.    Notes:     . [facility] - Track and Manage Heart Rate and Rhythm-Atrial Fibrillation       Timeframe:  Short-Term Goal Priority:  Medium Start Date:        12/8                   Expected End Date:    01/17/2020                     - begin a symptom diary - bring symptom diary to all appointments - check pulse (heart) rate before taking medicine - check pulse (heart) rate once a day - make a plan to exercise regularly - make a plan to eat healthy - keep all lab appointments - take medicine as prescribed    Why is this important?    Atrial fibrillation may have no symptoms. Sometimes the symptoms get worse or happen more often.   It is important to keep track of what your  symptoms are and when they happen.   A change in symptoms is important to discuss with your doctor or nurse.   Being active and healthy eating will also help you manage your heart condition.     Notes:       Valente David, RN, MSN [place] [phone]

## 2019-12-17 NOTE — Patient Instructions (Addendum)
Medication Instructions:  Your physician has recommended you make the following change in your medication:  1. STOP FUROSEMIDE   2. START TORSEMIDE  40 MG TWICE DAILY   *If you need a refill on your cardiac medications before your next appointment, please call your pharmacy*   Lab Work: TO BE DONE ON Monday OR Tuesday: BMET If you have labs (blood work) drawn today and your tests are completely normal, you will receive your results only by: Marland Kitchen MyChart Message (if you have MyChart) OR . A paper copy in the mail If you have any lab test that is abnormal or we need to change your treatment, we will call you to review the results.   Testing/Procedures: NONE   Follow-Up: At [facility], you and your health needs are our priority.  As part of our continuing mission to provide you with exceptional heart care, we have created designated Provider Care Teams.  These Care Teams include your primary Cardiologist (physician) and Advanced Practice Providers (APPs -  Physician Assistants and Nurse Practitioners) who all work together to provide you with the care you need, when you need it.  We recommend signing up for the patient portal called "MyChart".  Sign up information is provided on this After Visit Summary.  MyChart is used to connect with patients for Virtual Visits (Telemedicine).  Patients are able to view lab/test results, encounter notes, upcoming appointments, etc.  Non-urgent messages can be sent to your provider as well.   To learn more about what you can do with MyChart, go to [email or web address].    Your next appointment:   Your physician recommends that you keep your scheduled follow-up appointment with Bernerd Pho, PA-C   Other Instructions   *PLEASE [place] ON Friday 12/10 TO GIVE YOUR RECORDED WEIGHT

## 2019-12-19 ENCOUNTER — Telehealth: Payer: Self-pay | Admitting: Student

## 2019-12-19 NOTE — Telephone Encounter (Signed)
I spoke with patient, she will increase potassium to 60 meq until labs on Monday 12/22/19

## 2019-12-19 NOTE — Telephone Encounter (Signed)
New message    Patient is concerned about weakness in her legs , she has dropped in weight since starting new meds from 153.2 to 150.2

## 2019-12-19 NOTE — Telephone Encounter (Signed)
Patient reports her swelling in her legs is about "half gone" and no longer has swelling in her toes. She sweeps her floors and her legs feel weak in her thighs, this is new to her.Her labs will be done on Monday 12/12

## 2019-12-19 NOTE — Telephone Encounter (Signed)
    With her weight having decreased by 3 lbs, I would recommend she continue Torsemide 40mg  BID for now. Her K+ could be low which could be contributing some, therefore would increase K-dur to 60 mEq over the weekend until she has labs next Monday.   Signed, Erma Heritage, PA-C 12/19/2019, 4:54 PM Pager: [phone]

## 2019-12-22 ENCOUNTER — Telehealth (HOSPITAL_COMMUNITY): Payer: Medicare Other | Admitting: Hematology

## 2019-12-22 ENCOUNTER — Other Ambulatory Visit: Payer: Self-pay

## 2019-12-22 ENCOUNTER — Other Ambulatory Visit (HOSPITAL_COMMUNITY)
Admission: RE | Admit: 2019-12-22 | Discharge: 2019-12-22 | Disposition: A | Discharge status: Home / Self Care | Payer: Medicare Other | Source: Ambulatory Visit | Attending: Student | Admitting: Student

## 2019-12-22 DIAGNOSIS — I4819 Other persistent atrial fibrillation: Secondary | ICD-10-CM | POA: Insufficient documentation

## 2019-12-22 LAB — BASIC METABOLIC PANEL
Anion gap: 9 (ref 5–15)
BUN: 38 mg/dL — ABNORMAL HIGH (ref 8–23)
CO2: 29 mmol/L (ref 22–32)
Calcium: 8.6 mg/dL — ABNORMAL LOW (ref 8.9–10.3)
Chloride: 97 mmol/L — ABNORMAL LOW (ref 98–111)
Creatinine, Ser: 1.6 mg/dL — ABNORMAL HIGH (ref 0.44–1.00)
GFR, Estimated: 31 mL/min — ABNORMAL LOW (ref >60)
Glucose, Bld: 107 mg/dL — ABNORMAL HIGH (ref 70–99)
Potassium: 4.2 mmol/L (ref 3.5–5.1)
Sodium: 135 mmol/L (ref 135–145)

## 2019-12-23 ENCOUNTER — Telehealth: Payer: Self-pay

## 2019-12-23 ENCOUNTER — Other Ambulatory Visit: Payer: Self-pay

## 2019-12-23 ENCOUNTER — Inpatient Hospital Stay (HOSPITAL_COMMUNITY): Payer: Medicare Other

## 2019-12-23 VITALS — BP 102/58 | HR 96 | Temp 97.0°F | Resp 18

## 2019-12-23 DIAGNOSIS — D509 Iron deficiency anemia, unspecified: Secondary | ICD-10-CM | POA: Diagnosis not present

## 2019-12-23 DIAGNOSIS — D500 Iron deficiency anemia secondary to blood loss (chronic): Secondary | ICD-10-CM

## 2019-12-23 DIAGNOSIS — Z79899 Other long term (current) drug therapy: Secondary | ICD-10-CM

## 2019-12-23 MED ORDER — DIPHENHYDRAMINE HCL 50 MG/ML IJ SOLN
25.0000 mg | Freq: Once | INTRAMUSCULAR | Status: AC
  Administered 2019-12-23: 14:00:00 25 mg via INTRAVENOUS
  Filled 2019-12-23: qty 1

## 2019-12-23 MED ORDER — FAMOTIDINE IN NACL 20-0.9 MG/50ML-% IV SOLN
20.0000 mg | Freq: Once | INTRAVENOUS | Status: AC
  Administered 2019-12-23: 14:00:00 20 mg via INTRAVENOUS
  Filled 2019-12-23: qty 50

## 2019-12-23 MED ORDER — SODIUM CHLORIDE 0.9 % IV SOLN: Freq: Once | INTRAVENOUS | Status: AC

## 2019-12-23 MED ORDER — METHYLPREDNISOLONE SODIUM SUCC 125 MG IJ SOLR
60.0000 mg | Freq: Once | INTRAMUSCULAR | Status: AC
  Administered 2019-12-23: 14:00:00 60 mg via INTRAVENOUS
  Filled 2019-12-23: qty 2

## 2019-12-23 MED ORDER — SODIUM CHLORIDE 0.9 % IV SOLN
750.0000 mg | Freq: Once | INTRAVENOUS | Status: AC
  Administered 2019-12-23: 15:00:00 750 mg via INTRAVENOUS
  Filled 2019-12-23: qty 15

## 2019-12-23 MED ORDER — TORSEMIDE 20 MG PO TABS: 40.0000 mg | ORAL_TABLET | Freq: Every day | ORAL | 11 refills | Status: DC

## 2019-12-23 NOTE — Progress Notes (Signed)
Erin Schmidt presents today for IV iron infusion. Pre-medications given as ordered by MD in supportive therapy plan. Infusion tolerated without incident or complaint. See MAR for details. VSS prior to and post infusion. Patient observed for 30 minutes post infusion and was discharged in satisfactory condition with follow up instructions.

## 2019-12-23 NOTE — Patient Instructions (Signed)
[facility]  Discharge Instructions:   _______________________________________________________________  Thank you for choosing [facility] at [facility] to provide your oncology and hematology care.  To afford each patient quality time with our providers, please arrive at least 15 minutes before your scheduled appointment.  You need to re-schedule your appointment if you arrive 10 or more minutes late.  We strive to give you quality time with our providers, and arriving late affects you and other patients whose appointments are after yours.  Also, if you no show three or more times for appointments you may be dismissed from the clinic.  Again, thank you for choosing  Can[facility] at  Hospital. Our hope is that these requests will allow you access to exceptional care and in a timely manner. _______________________________________________________________  If you have questions after your visit, please contact our office at [phone] between the hours of 8:30 a.m. and 5:00 p.m. Voicemails left after 4:30 p.m. will not be returned until the following business day. _______________________________________________________________  For prescription refill requests, have your pharmacy contact our office. _______________________________________________________________  Recommendations made by the consultant and any test results will be sent to your referring physician. _______________________________________________________________ 

## 2019-12-23 NOTE — Telephone Encounter (Signed)
Per B.Strader, PA-C :   Please let the patient know her K+ level is within a normal range. Kidney function has worsened with aggressive diuresis which is expected. Is her weight close to baseline? If so, would recommend reducing Torsemide from 40mg  BID to 40mg  once daily and rechecking BMET in 1 week.     I spoke with patient.She will reduce her torsemide to 40 mg daily (from BID) and repeat BMET in 1 week.  She is at baseline weight today: 150.4 lbs

## 2019-12-24 ENCOUNTER — Telehealth: Payer: Self-pay | Admitting: Student

## 2019-12-24 NOTE — Telephone Encounter (Signed)
Patient states her weight yesterday was 150.4 lbs but today it was 152.8 lbs  She is going to take additional torsemide 20 mg and see where her weight is tomorrow.

## 2019-12-24 NOTE — Telephone Encounter (Signed)
     2.4 ounces and not lbs, correct? They typically give IVF with iron infusions so she can take a extra 20mg  of Torsemide today and another extra 20mg  tomorrow if needed as I know we just reduced her dosing from 40mg  BID to 40mg  daily.   Signed, Erma Heritage, PA-C 12/24/2019, 10:42 AM Pager: [phone]

## 2019-12-24 NOTE — Telephone Encounter (Signed)
Left message asking patient to return call. 

## 2019-12-24 NOTE — Telephone Encounter (Signed)
New message     Patient states since having her iron infusion she has gain 2.4 oz and she is having sob , her bp is last night 99/64 Tuesday 92/61 Monday 98/54  Sunday 103/62 Saturday  92/67

## 2019-12-24 NOTE — Telephone Encounter (Signed)
I will forward to B.Strader, PA-C for direction.

## 2019-12-26 ENCOUNTER — Ambulatory Visit (HOSPITAL_COMMUNITY): Payer: Medicare Other

## 2019-12-29 ENCOUNTER — Telehealth: Payer: Self-pay | Admitting: Student

## 2019-12-29 ENCOUNTER — Encounter (INDEPENDENT_AMBULATORY_CARE_PROVIDER_SITE_OTHER): Payer: Medicare Other | Admitting: Ophthalmology

## 2019-12-29 ENCOUNTER — Ambulatory Visit (INDEPENDENT_AMBULATORY_CARE_PROVIDER_SITE_OTHER): Payer: Medicare Other | Admitting: Ophthalmology

## 2019-12-29 ENCOUNTER — Other Ambulatory Visit: Payer: Self-pay

## 2019-12-29 ENCOUNTER — Encounter (INDEPENDENT_AMBULATORY_CARE_PROVIDER_SITE_OTHER): Payer: Self-pay | Admitting: Ophthalmology

## 2019-12-29 DIAGNOSIS — H43823 Vitreomacular adhesion, bilateral: Secondary | ICD-10-CM | POA: Diagnosis not present

## 2019-12-29 DIAGNOSIS — H33102 Unspecified retinoschisis, left eye: Secondary | ICD-10-CM | POA: Diagnosis not present

## 2019-12-29 MED ORDER — TORSEMIDE 20 MG PO TABS: ORAL_TABLET | ORAL | 11 refills | Status: DC

## 2019-12-29 NOTE — Telephone Encounter (Signed)
     I had reduced her Torsemide from 40mg  BID to 40mg  daily last week due to worsening renal function. If she is still having difficulty breathing and this improves with Torsemide, I am concerned she is still retaining fluid. I would recommend she increase her Torsemide to 40mg  in AM/20mg  in PM for the next week and see if this helps with her symptoms as hopefully the extra Torsemide later in the day will help with symptoms. Continue to follow daily weights and keep plans for repeat labs (would have next given dose adjustment of Torsemide).   Thanks,  [place]

## 2019-12-29 NOTE — Progress Notes (Signed)
12/29/2019     CHIEF COMPLAINT Patient presents for Retina Follow Up (6 Month F/U OU//Pt denies noticeable changes to [place] OU since last visit. Pt denies ocular pain, flashes of light, or floaters OU. //)   HISTORY OF PRESENT ILLNESS: Erin Schmidt is a 86 y.o. female who presents to the clinic today for:   HPI    Retina Follow Up    Patient presents with  Other.  In both eyes.  This started 6 months ago.  Severity is mild.  Duration of 6 months.  Since onset it is stable. Additional comments: 6 Month F/U OU  Pt denies noticeable changes to [place] OU since last visit. Pt denies ocular pain, flashes of light, or floaters OU.          Last edited by Rockie Neighbours, [place] on 12/29/2019  2:11 PM. (History)      Referring physician: Sharilyn Sites, MD [address],  [place] [address]  HISTORICAL INFORMATION:   Selected notes from the [facility]: No current outpatient medications on file. (Ophthalmic Drugs)   No current facility-administered medications for this visit. (Ophthalmic Drugs)   Current Outpatient Medications (Other)  Medication Sig  . apixaban (ELIQUIS) 5 MG TABS tablet Take 1 tablet (5 mg total) by mouth 2 (two) times daily.  . benazepril-hydrochlorthiazide (LOTENSIN HCT) 20-12.5 MG tablet 1 tablet  . Calcium Carb-Cholecalciferol 600-200 MG-UNIT TABS 1 tablet with food  . Calcium Carbonate-Vitamin D (CALCIUM + D PO) Take 1 tablet by mouth 2 (two) times daily.   . cetirizine (ZYRTEC) 10 MG tablet Take 10 mg by mouth daily.  . cetirizine (ZYRTEC) 10 MG tablet 1 tablet  . Cholecalciferol (VITAMIN D3 PO) Take by mouth.  . diltiazem (CARDIZEM CD) 180 MG 24 hr capsule   . diltiazem (CARDIZEM CD) 240 MG 24 hr capsule Take 1 capsule (240 mg total) by mouth daily.  Marland Kitchen escitalopram (LEXAPRO) 10 MG tablet Take 10 mg by mouth daily.  Marland Kitchen escitalopram (LEXAPRO) 10 MG tablet 1 tablet  . etanercept (ENBREL SURECLICK) 50 MG/ML  injection Inject 50 mg into the skin once a week.  . etanercept (ENBREL) 50 MG/ML injection 1 ml  . hydroxychloroquine (PLAQUENIL) 200 MG tablet TAKE 1 TABLET BY MOUTH TWICE A DAY (Patient taking differently: Take 200 mg by mouth 2 (two) times daily. )  . levothyroxine (SYNTHROID) 112 MCG tablet Take 1 tablet by mouth daily.  Marland Kitchen levothyroxine (SYNTHROID, LEVOTHROID) 112 MCG tablet Take 112 mcg by mouth daily before breakfast.   . meclizine (ANTIVERT) 25 MG tablet Take 25 mg by mouth 3 (three) times daily as needed for dizziness.   . metoprolol tartrate (LOPRESSOR) 25 MG tablet Take 0.5 tablets (12.5 mg total) by mouth 2 (two) times daily.  Marland Kitchen omeprazole (PRILOSEC) 40 MG capsule 1 capsule  . potassium chloride SA (KLOR-CON) 20 MEQ tablet Take 1 tablet (20 mEq total) by mouth 2 (two) times daily.  Marland Kitchen torsemide (DEMADEX) 20 MG tablet Take 2 tablets (40 mg total) by mouth daily.  . traMADol-acetaminophen (ULTRACET) 37.5-325 MG tablet Take 1 tablet by mouth in the morning and at bedtime.    No current facility-administered medications for this visit. (Other)      REVIEW OF SYSTEMS:    ALLERGIES Allergies  Allergen Reactions  . Feraheme [Ferumoxytol]     Reacted during infusion requiring medical intervention. Increased BP    PAST MEDICAL HISTORY Past Medical History:  Diagnosis Date  . Arthritis   . Atrial fibrillation ([place])   . Dysrhythmia   . GERD (gastroesophageal reflux disease)   . Hyperlipemia   . Hypertension    preventative measures  . Hypothyroidism   . Iron deficiency anemia 05/07/2019  . Migraine   . Thyroid disease   . Vertigo    Past Surgical History:  Procedure Laterality Date  . APPENDECTOMY    . BREAST BIOPSY Right    benign  . BREAST EXCISIONAL BIOPSY Left    Benign  . BREAST EXCISIONAL BIOPSY Left    Benign  . BREAST LUMPECTOMY  30 yrs ago   Dr. Milbert Coulter- rt breast  . BREAST LUMPECTOMY  15 yrs ago   left- Dr. Romona Curls  . BREAST LUMPECTOMY  01/2011  .  CARDIOVERSION N/A 11/10/2019   Procedure: CARDIOVERSION;  Surgeon: Arnoldo Lenis, MD;  Location: [facility];  Service: Endoscopy;  Laterality: N/A;  . CHOLECYSTECTOMY    . COLONOSCOPY    . COLONOSCOPY  04/13/2011   Procedure: COLONOSCOPY;  Surgeon: Rogene Houston, MD;  Location: [facility];  Service: Endoscopy;  Laterality: N/A;  930  . MASTECTOMY, PARTIAL  01/13/2011   Procedure: MASTECTOMY PARTIAL;  Surgeon: Adin Hector, MD;  Location: [place] [facility];  Service: General;  Laterality: Left;  left partial mastectomy with needle locallization  . VAGINAL HYSTERECTOMY     2 partials    FAMILY HISTORY Family History  Problem Relation Age of Onset  . Heart disease Mother   . Stroke Sister   . Heart disease Brother   . Atrial fibrillation Sister   . Coronary artery disease Brother   . Lung cancer Son   . Healthy Son   . Colon cancer Neg Hx     SOCIAL HISTORY Social History   Tobacco Use  . Smoking status: Former Smoker    Packs/day: 1.00    Years: 20.00    Pack years: 20.00    Types: Cigarettes    Quit date: 01/05/1989    Years since quitting: 31.0  . Smokeless tobacco: Never Used  Vaping Use  . Vaping Use: Never used  Substance Use Topics  . Alcohol use: No  . Drug use: Never         OPHTHALMIC EXAM:  Base Eye Exam    Visual Acuity (ETDRS)      Right Left   Dist cc 20/40 20/30   Dist ph cc NI NI   Correction: Glasses       Tonometry (Tonopen, 2:12 PM)      Right Left   Pressure 12 18       Pupils      Pupils Dark Light Shape React APD   Right PERRL 4 3 Round Slow None   Left PERRL 4 3 Round Slow None       Visual Fields (Counting fingers)      Left Right    Full Full       Extraocular Movement      Right Left    Full Full       Neuro/Psych    Oriented x3: Yes   Mood/Affect: Normal       Dilation    Both eyes:         Slit Lamp and Fundus Exam    External Exam      Right Left   External Normal Normal       Slit  Lamp Exam  Right Left   Lids/Lashes Normal Normal   Conjunctiva/Sclera White and quiet White and quiet   Cornea Clear Clear   Anterior Chamber Deep and quiet Deep and quiet   Iris Round and reactive Round and reactive   Lens Posterior chamber intraocular lens Posterior chamber intraocular lens   Anterior Vitreous Normal Normal       Fundus Exam      Right Left   Posterior Vitreous Posterior vitreous detachment Posterior vitreous detachment   Disc Normal Normal   C/D Ratio 0.25 0.15   Macula Normal,  Normal,    Vessels Normal Normal   Periphery Normal Normal          IMAGING AND PROCEDURES  Imaging and Procedures for 12/29/19  OCT, Retina - OU - Both Eyes       Right Eye Quality was good. Scan locations included subfoveal. Central Foveal Thickness: 331. Progression has been stable. Findings include vitreomacular adhesion , vitreous traction.   Left Eye Quality was good. Central Foveal Thickness: 440. Findings include vitreomacular adhesion , vitreous traction.   Notes Vitreomacular traction, OU.  Minor inner foveal schisis OD  OS with diffuse Foveal macular schisis secondary to a large broad vitreomacular traction, no involvement of the outer retina at this stage                ASSESSMENT/PLAN:  Macular retinoschisis of left eye No interval change over the last 6 months  Vitreomacular adhesion of both eyes Each eye with modest vision loss.  I did inform the patient that spectacle correction not likely to improve this mild to moderate deficit OU is experiencing.  I will reinstruct her in the use of monocular testing particularly with reading in order to look for changes letter of reading vision that might be triggered from vitreomacular traction and distortion of the fovea.  Intervention would likely improve reading vision in that circumstance.      ICD-10-CM   1. Vitreomacular adhesion of both eyes  H43.823 OCT, Retina - OU - Both Eyes  2. Macular  retinoschisis of left eye  H33.102 OCT, Retina - OU - Both Eyes    1.  2.  3.  Ophthalmic Meds Ordered this visit:  No orders of the defined types were placed in this encounter.      Return in about 6 months (around 06/28/2020) for DILATE OU, OCT.  There are no Patient Instructions on file for this visit.   Explained the diagnoses, plan, and follow up with the patient and they expressed understanding.  Patient expressed understanding of the importance of proper follow up care.   Clent Demark Michala Deblanc M.D. Diseases & Surgery of the Retina and Vitreous Retina & Diabetic [place] 12/29/19     Abbreviations: M myopia (nearsighted); A astigmatism; H hyperopia (farsighted); P presbyopia; Mrx spectacle prescription;  CTL contact lenses; OD right eye; OS left eye; OU both eyes  XT exotropia; ET esotropia; PEK punctate epithelial keratitis; PEE punctate epithelial erosions; DES dry eye syndrome; MGD meibomian gland dysfunction; ATs artificial tears; PFAT's preservative free artificial tears; [place] nuclear sclerotic cataract; PSC posterior subcapsular cataract; ERM epi-retinal membrane; PVD posterior vitreous detachment; RD retinal detachment; DM diabetes mellitus; DR diabetic retinopathy; NPDR non-proliferative diabetic retinopathy; PDR proliferative diabetic retinopathy; CSME clinically significant macular edema; DME diabetic macular edema; dbh dot blot hemorrhages; CWS cotton wool spot; POAG primary open angle glaucoma; C/D cup-to-disc ratio; HVF humphrey visual field; GVF goldmann visual field; OCT optical coherence tomography; IOP intraocular  pressure; BRVO Branch retinal vein occlusion; CRVO central retinal vein occlusion; CRAO central retinal artery occlusion; BRAO branch retinal artery occlusion; RT retinal tear; SB scleral buckle; PPV pars plana vitrectomy; VH Vitreous hemorrhage; PRP panretinal laser photocoagulation; IVK intravitreal kenalog; VMT vitreomacular traction; MH Macular hole;  NVD  neovascularization of the disc; NVE neovascularization elsewhere; AREDS age related eye disease study; ARMD age related macular degeneration; POAG primary open angle glaucoma; EBMD epithelial/anterior basement membrane dystrophy; ACIOL anterior chamber intraocular lens; IOL intraocular lens; PCIOL posterior chamber intraocular lens; Phaco/IOL phacoemulsification with intraocular lens placement; [place] photorefractive keratectomy; LASIK laser assisted in situ keratomileusis; HTN hypertension; DM diabetes mellitus; COPD chronic obstructive pulmonary disease

## 2019-12-29 NOTE — Assessment & Plan Note (Signed)
Each eye with modest vision loss.  I did inform the patient that spectacle correction not likely to improve this mild to moderate deficit OU is experiencing.  I will reinstruct her in the use of monocular testing particularly with reading in order to look for changes letter of reading vision that might be triggered from vitreomacular traction and distortion of the fovea.  Intervention would likely improve reading vision in that circumstance.

## 2019-12-29 NOTE — Telephone Encounter (Signed)
New message     Pt c/o swelling: STAT is pt has developed SOB within 24 hours  1) How much weight have you gained and in what time span?  no  2) If swelling, where is the swelling located?  Chest , legs and feet , she has rattling in chest, voice raspy ,   3) Are you currently taking a fluid pill?  yes  4) Are you currently SOB?  yes  5) Do you have a log of your daily weights (if so, list)? 153.2 152.6   6) Have you gained 3 pounds in a day or 5 pounds in a week? no  7) Have you traveled recently? no

## 2019-12-29 NOTE — Assessment & Plan Note (Signed)
No interval change over the last 6 months

## 2019-12-29 NOTE — Telephone Encounter (Signed)
Pt notified and order placed. Pt voiced understanding.

## 2019-12-29 NOTE — Telephone Encounter (Signed)
Spoke with pt who states that over the last two days she has awaken with wheezing and no energy. Pt reports that 2 hours after taking torsemide her breathing is better. Pt denies chest pain at this time. Her weights are as follows today 152.6 lb, yesterday 151.4 lbs, 12/18 150.4 lbs, 12/17 150.2 lbs. Please advise.

## 2019-12-30 ENCOUNTER — Other Ambulatory Visit: Payer: Self-pay | Admitting: *Deleted

## 2019-12-30 NOTE — Patient Outreach (Signed)
[facility]) Care Management  12/30/2019  Erin Schmidt [birth year] [id number]   Call placed to member to follow up on A-fib and HF management.  She state the past week have been a little rough, but state she is feeling better today since some medication changes.  Report shortness of breath is much better and she was able to sleep comfortably last night.  Her Torsemide had been cut in half, taking only 40mg  daily instead of twice a day.  She is not taking 40 mg in the morning and 20 mg in the evening.  She has been in contact several days with cardiology office for advice, aware of when to contact them with problems.  Scheduled for follow up on 1/5.    She continues to monitor weights, blood pressure, and HR daily.  Weights have ranged from 149-152 pounds over the last 2 weeks.  HR has remained less than 100, blood pressure ranging 90-100s/60-70's.  Will continue to monitor diet as well.  Denies any urgent concerns, encouraged to contact this care manager with questions.  Agrees to follow up within the next month.  Goals Addressed            This Visit's Progress   . [facility] - Track and Manage Activity and Exertion-Heart Failure   On track    Timeframe:  Long-Range Goal Priority:  Medium Start Date:     12/8                        Expected End Date:       02/17/2020             - avoid heavy exercise on very hot days - drink water to stay hydrated during exercise - follow activity or exercise plan - make an activity or exercise plan - track exercise in diary for two weeks - track symptoms during activity in diary    Why is this important?    Exercising is very important when managing your heart failure.   It will help your heart get stronger.    Notes:     . [facility]N - Track and Manage Fluids and Swelling-Heart Failure   On track    Timeframe:  Short-Term Goal Priority:  High Start Date:     12/8                        Expected End Date:       01/17/2020                    - call office if I gain more than 2 pounds in one day or 5 pounds in one week - keep legs up while sitting - track weight in diary - use salt in moderation - watch for swelling in feet, ankles and legs every day - weigh myself daily    Why is this important?    It is important to check your weight daily and watch how much salt and liquids you have.   It will help you to manage your heart failure.    Notes:     . [facility] - Track and Manage Heart Rate and Rhythm-Atrial Fibrillation   On track    Timeframe:  Short-Term Goal Priority:  Medium Start Date:        12/8  Expected End Date:    01/17/2020                     - begin a symptom diary - bring symptom diary to all appointments - check pulse (heart) rate before taking medicine - check pulse (heart) rate once a day - make a plan to exercise regularly - make a plan to eat healthy - keep all lab appointments - take medicine as prescribed    Why is this important?    Atrial fibrillation may have no symptoms. Sometimes the symptoms get worse or happen more often.   It is important to keep track of what your symptoms are and when they happen.   A change in symptoms is important to discuss with your doctor or nurse.   Being active and healthy eating will also help you manage your heart condition.     Notes:       Valente David, RN, MSN [place] [phone]

## 2019-12-31 ENCOUNTER — Inpatient Hospital Stay (HOSPITAL_COMMUNITY): Payer: Medicare Other

## 2020-01-01 ENCOUNTER — Encounter (INDEPENDENT_AMBULATORY_CARE_PROVIDER_SITE_OTHER): Payer: Medicare Other | Admitting: Ophthalmology

## 2020-01-01 ENCOUNTER — Telehealth: Payer: Self-pay | Admitting: Rheumatology

## 2020-01-01 NOTE — Telephone Encounter (Signed)
[employer] calling because they received the patient assistance application, but they need a PA for 2022 before it can be processed. Fax# [phone]

## 2020-01-01 NOTE — Telephone Encounter (Signed)
Submitted a Prior Authorization renewal request to [employer] for [employer] via Cover My Meds. Will update once we receive a response.  Key: [id number]  Knox Saliva, PharmD, MPH Clinical Pharmacist (Rheumatology and Pulmonology)

## 2020-01-02 NOTE — Telephone Encounter (Signed)
Received notification from [facility] regarding a prior authorization renewal for ENBREL. Authorization has been APPROVED from 01/10/20 to 01/08/21.   Authorization #  [id number] Phone # [phone]  Will fax approval letter to [employer]  Knox Saliva, PharmD, MPH Clinical Pharmacist (Rheumatology and Pulmonology)

## 2020-01-06 ENCOUNTER — Encounter (HOSPITAL_COMMUNITY): Payer: Self-pay

## 2020-01-06 ENCOUNTER — Inpatient Hospital Stay (HOSPITAL_COMMUNITY): Payer: Medicare Other

## 2020-01-06 ENCOUNTER — Other Ambulatory Visit: Payer: Self-pay

## 2020-01-06 VITALS — BP 103/69 | HR 84 | Temp 98.2°F | Resp 18

## 2020-01-06 DIAGNOSIS — D500 Iron deficiency anemia secondary to blood loss (chronic): Secondary | ICD-10-CM

## 2020-01-06 DIAGNOSIS — D509 Iron deficiency anemia, unspecified: Secondary | ICD-10-CM | POA: Diagnosis not present

## 2020-01-06 MED ORDER — SODIUM CHLORIDE 0.9 % IV SOLN
750.0000 mg | Freq: Once | INTRAVENOUS | Status: AC
  Administered 2020-01-06: 15:00:00 750 mg via INTRAVENOUS
  Filled 2020-01-06: qty 15

## 2020-01-06 MED ORDER — DIPHENHYDRAMINE HCL 50 MG/ML IJ SOLN
25.0000 mg | Freq: Once | INTRAMUSCULAR | Status: AC
  Administered 2020-01-06: 14:00:00 25 mg via INTRAVENOUS
  Filled 2020-01-06: qty 1

## 2020-01-06 MED ORDER — SODIUM CHLORIDE 0.9 % IV SOLN: Freq: Once | INTRAVENOUS | Status: AC

## 2020-01-06 MED ORDER — FAMOTIDINE IN NACL 20-0.9 MG/50ML-% IV SOLN
20.0000 mg | Freq: Once | INTRAVENOUS | Status: AC
  Administered 2020-01-06: 14:00:00 20 mg via INTRAVENOUS
  Filled 2020-01-06: qty 50

## 2020-01-06 MED ORDER — METHYLPREDNISOLONE SODIUM SUCC 125 MG IJ SOLR
60.0000 mg | Freq: Once | INTRAMUSCULAR | Status: AC
  Administered 2020-01-06: 14:00:00 60 mg via INTRAVENOUS
  Filled 2020-01-06: qty 2

## 2020-01-06 NOTE — Patient Instructions (Signed)
[facility] at [facility] Discharge Instructions  Received Injectafer infusion today. Follow-up as scheduled   Thank you for choosing [facility] at [facility] to provide your oncology and hematology care.  To afford each patient quality time with our provider, please arrive at least 15 minutes before your scheduled appointment time.   If you have a lab appointment with the Cancer Center please come in thru the Main Entrance and check in at the main information desk.  You need to re-schedule your appointment should you arrive 10 or more minutes late.  We strive to give you quality time with our providers, and arriving late affects you and other patients whose appointments are after yours.  Also, if you no show three or more times for appointments you may be dismissed from the clinic at the providers discretion.     Again, thank you for choosing [facility].  Our hope is that these requests will decrease the amount of time that you wait before being seen by our physicians.       _____________________________________________________________  Should you have questions after your visit to [facility], please contact our office at [phone] and follow the prompts.  Our office hours are 8:00 a.m. and 4:30 p.m. Monday - Friday.  Please note that voicemails left after 4:00 p.m. may not be returned until the following business day.  We are closed weekends and major holidays.  You do have access to a nurse 24-7, just call the main number to the clinic [phone] and do not press any options, hold on the line and a nurse will answer the phone.    For prescription refill requests, have your pharmacy contact our office and allow 72 hours.    Due to Covid, you will need to wear a mask upon entering the hospital. If you do not have a mask, a mask will be given to you at the Main Entrance upon arrival. For doctor visits, patients may  have 1 support person age 33 or older with them. For treatment visits, patients can not have anyone with them due to social distancing guidelines and our immunocompromised population.

## 2020-01-06 NOTE — Progress Notes (Signed)
Erin Schmidt tolerated Injectafer infusion well without complaints or incident. Peripheral IV site checked with positive blood return noted prior to and after infusion. VSS Pt discharged self ambulatory in satisfactory condition

## 2020-01-07 ENCOUNTER — Telehealth: Payer: Self-pay | Admitting: Student

## 2020-01-07 NOTE — Telephone Encounter (Signed)
Patient called to advise the changes made in Lasix helped with most of her swelling. She went for iron infusion yesterday and her top BP numbers went from 94 to 104.

## 2020-01-07 NOTE — Telephone Encounter (Signed)
Patient states that she has felt better since she had an adjustment to her Lasix. Her blood pressure has come up from the upper 80's/lower 90's to the lower 100's. Will continue to monitor her B/P and call with any abnormal readings for advice.

## 2020-01-12 ENCOUNTER — Other Ambulatory Visit (HOSPITAL_COMMUNITY): Payer: Medicare Other

## 2020-01-13 ENCOUNTER — Ambulatory Visit (HOSPITAL_COMMUNITY): Payer: Medicare Other | Admitting: Oncology

## 2020-01-14 ENCOUNTER — Other Ambulatory Visit: Payer: Self-pay | Admitting: Physician Assistant

## 2020-01-14 ENCOUNTER — Telehealth (INDEPENDENT_AMBULATORY_CARE_PROVIDER_SITE_OTHER): Payer: Medicare Other | Admitting: Student

## 2020-01-14 VITALS — BP 88/61 | HR 105 | Wt 144.4 lb

## 2020-01-14 DIAGNOSIS — Z79899 Other long term (current) drug therapy: Secondary | ICD-10-CM | POA: Diagnosis not present

## 2020-01-14 DIAGNOSIS — I340 Nonrheumatic mitral (valve) insufficiency: Secondary | ICD-10-CM

## 2020-01-14 DIAGNOSIS — I4819 Other persistent atrial fibrillation: Secondary | ICD-10-CM | POA: Diagnosis not present

## 2020-01-14 DIAGNOSIS — M0609 Rheumatoid arthritis without rheumatoid factor, multiple sites: Secondary | ICD-10-CM

## 2020-01-14 DIAGNOSIS — I10 Essential (primary) hypertension: Secondary | ICD-10-CM | POA: Diagnosis not present

## 2020-01-14 DIAGNOSIS — R600 Localized edema: Secondary | ICD-10-CM | POA: Diagnosis not present

## 2020-01-14 NOTE — Progress Notes (Signed)
Virtual Visit via Telephone Note   This visit type was conducted due to national recommendations for restrictions regarding the COVID-19 Pandemic (e.g. social distancing) in an effort to limit this patient's exposure and mitigate transmission in our community.  Due to her co-morbid illnesses, this patient is at least at moderate risk for complications without adequate follow up.  This format is felt to be most appropriate for this patient at this time.  The patient did not have access to video technology/had technical difficulties with video requiring transitioning to audio format only (telephone).  All issues noted in this document were discussed and addressed.  No physical exam could be performed with this format.  Please refer to the patient's chart for her  consent to telehealth for [facility].    Date:  01/14/2020   ID:  Erin Schmidt, DOB 1932/02/04, MRN [id number] The patient was identified using 2 identifiers.  Patient Location: Home Provider Location: Office/Clinic  PCP:  Assunta Found, MD  Cardiologist:  Dina Rich, MD  Electrophysiologist:  None   Evaluation Performed:  Follow-Up Visit  Chief Complaint: 12-month visit  History of Present Illness:    Erin Schmidt is a 87 y.o. female with past medical historyof persistent atrial fibrillation(s/p DCCV on 11/10/2019 with recurrence at follow-up --> rate-control pursued given severe LA dilation), moderate MR, HTN, hypothyroidism and iron deficiency anemia who presents for a 77-month follow-up telehealth visit.   She was last examined myself on 12/17/2019 and had been taking Lasix 60 mg twice daily for several days with minimal improvement in her edema or dyspnea. Lasix was therefore discontinued and she was switched to Torsemide 40 mg twice daily. She did call the office in the interim and reported her weight had decreased by 3 pounds and she was experiencing leg weakness, therefore Torsemide was reduced to 40 mg  twice daily. Repeat labs on 12/22/2019 showed that her creatinine had increased from 1.21 to 1.60 and Torsemide was reduced to 40 mg daily.  She most recently called the office on 12/29/2019 reporting that her weight had increased by 2 pounds and she had experienced worsening dyspnea, therefore Torsemide was increased to 40 mg in AM/20mg  in PM.   In talking with the patient today, she reports her orthopnea and dyspnea improved with dose adjustment of Torsemide and symptoms have been stable since. HR has been variable from the 70's to 110's when checked at home. She denies any pitting edema but says her legs feel tight at times. No recent chest pain or tachy-palpitations. Her BP was soft at 88/61 on most recent check and she denies any lightheadedness or dizziness. SBP is typically in the 90's to 110's and she checks her vitals at night.   She did switch to a Virtual Visit today due to a COVID exposure. She denies any symptoms concerning for COVID-19 infection (fever, chills, cough, or new shortness of breath).    Past Medical History:  Diagnosis Date  . Arthritis   . Atrial fibrillation (HCC)   . Dysrhythmia   . GERD (gastroesophageal reflux disease)   . Hyperlipemia   . Hypertension    preventative measures  . Hypothyroidism   . Iron deficiency anemia 05/07/2019  . Migraine   . Thyroid disease   . Vertigo    Past Surgical History:  Procedure Laterality Date  . APPENDECTOMY    . BREAST BIOPSY Right    benign  . BREAST EXCISIONAL BIOPSY Left    Benign  .  BREAST EXCISIONAL BIOPSY Left    Benign  . BREAST LUMPECTOMY  30 yrs ago   Dr. Milbert Coulter- rt breast  . BREAST LUMPECTOMY  15 yrs ago   left- Dr. Romona Curls  . BREAST LUMPECTOMY  01/2011  . CARDIOVERSION N/A 11/10/2019   Procedure: CARDIOVERSION;  Surgeon: Arnoldo Lenis, MD;  Location: [facility] ORS;  Service: Endoscopy;  Laterality: N/A;  . CHOLECYSTECTOMY    . COLONOSCOPY    . COLONOSCOPY  04/13/2011   Procedure: COLONOSCOPY;  Surgeon:  Rogene Houston, MD;  Location: [facility] SUITE;  Service: Endoscopy;  Laterality: N/A;  930  . MASTECTOMY, PARTIAL  01/13/2011   Procedure: MASTECTOMY PARTIAL;  Surgeon: Adin Hector, MD;  Location: [place];  Service: General;  Laterality: Left;  left partial mastectomy with needle locallization  . VAGINAL HYSTERECTOMY     2 partials     Current Meds  Medication Sig  . apixaban (ELIQUIS) 5 MG TABS tablet Take 1 tablet (5 mg total) by mouth 2 (two) times daily.  . Calcium Carbonate-Vitamin D (CALCIUM + D PO) Take 1 tablet by mouth 2 (two) times daily.  . cetirizine (ZYRTEC) 10 MG tablet 1 tablet  . cholecalciferol (VITAMIN D3) 25 MCG (1000 UNIT) tablet Take 25 mcg by mouth. Taking 25 mg daily  . diltiazem (CARDIZEM CD) 240 MG 24 hr capsule Take 1 capsule (240 mg total) by mouth daily.  Marland Kitchen escitalopram (LEXAPRO) 10 MG tablet Take 10 mg by mouth daily.  Marland Kitchen etanercept (ENBREL SURECLICK) 50 MG/ML injection Inject 50 mg into the skin once a week.  . hydroxychloroquine (PLAQUENIL) 200 MG tablet TAKE 1 TABLET BY MOUTH TWICE A DAY  . levothyroxine (SYNTHROID, LEVOTHROID) 112 MCG tablet Take 112 mcg by mouth daily before breakfast.   . meclizine (ANTIVERT) 25 MG tablet Take 25 mg by mouth 3 (three) times daily as needed for dizziness.   . Melatonin 10 MG CAPS Take 10 mg by mouth at bedtime.  . metoprolol tartrate (LOPRESSOR) 25 MG tablet Take 0.5 tablets (12.5 mg total) by mouth 2 (two) times daily.  Marland Kitchen omeprazole (PRILOSEC) 20 MG capsule Take 20 mg by mouth 3 (three) times a week. Take 1 on Mondays, Wednesdays and Fridays.  . potassium chloride SA (KLOR-CON) 20 MEQ tablet Take 1 tablet (20 mEq total) by mouth 2 (two) times daily.  Marland Kitchen torsemide (DEMADEX) 20 MG tablet Take 2 Tablet ( 40 mg ) in the AM and Take 1 Tablet ( 20 mg ) in the PM  . traMADol-acetaminophen (ULTRACET) 37.5-325 MG tablet Take 1 tablet by mouth in the morning and at bedtime.      Allergies:   Feraheme  [ferumoxytol]   Social History   Tobacco Use  . Smoking status: Former Smoker    Packs/day: 1.00    Years: 20.00    Pack years: 20.00    Types: Cigarettes    Quit date: 01/05/1989    Years since quitting: 31.0  . Smokeless tobacco: Never Used  Vaping Use  . Vaping Use: Never used  Substance Use Topics  . Alcohol use: No  . Drug use: Never     Family Hx: The patient's family history includes Atrial fibrillation in her sister; Coronary artery disease in her brother; Healthy in her son; Heart disease in her brother and mother; Lung cancer in her son; Stroke in her sister. There is no history of Colon cancer.  ROS:   Please see the history of present illness.  All other systems reviewed and are negative.   Prior CV studies:   The following studies were reviewed today:  Echocardiogram: 10/16/2019 IMPRESSIONS    1. Left ventricular ejection fraction, by estimation, is 50 to 55%. The  left ventricle has normal function. The left ventricle has no regional  wall motion abnormalities. Left ventricular diastolic parameters are  indeterminate.  2. Right ventricular systolic function is normal. The right ventricular  size is normal. There is mildly elevated pulmonary artery systolic  pressure.  3. Left atrial size was severely dilated.  4. Right atrial size was moderately dilated.  5. The mitral valve is normal in structure. Moderate mitral valve  regurgitation. No evidence of mitral stenosis.  6. Tricuspid valve regurgitation is mild to moderate.  7. The aortic valve is tricuspid. Aortic valve regurgitation is not  visualized. No aortic stenosis is present.  8. The inferior vena cava is dilated in size with <50% respiratory  variability, suggesting right atrial pressure of 15 mmHg.    Labs/Other Tests and Data Reviewed:    EKG:  An ECG dated 12/11/2019 was personally reviewed today and demonstrated: Atrial fibrillation, HR 103 with known RBBB.   Recent  Labs: 12/01/2019: B Natriuretic Peptide 701.0; TSH 3.486 12/03/2019: Magnesium 2.1 12/15/2019: ALT 18; Hemoglobin 8.1; Platelets 300 12/22/2019: BUN 38; Creatinine, Ser 1.60; Potassium 4.2; Sodium 135   Recent Lipid Panel No results found for: CHOL, TRIG, HDL, CHOLHDL, LDLCALC, LDLDIRECT  Wt Readings from Last 3 Encounters:  01/14/20 144 lb 6.4 oz (65.5 kg)  12/17/19 154 lb (69.9 kg)  12/11/19 153 lb (69.4 kg)      Objective:    Vital Signs:  BP (!) 88/61   Pulse (!) 105   Wt 144 lb 6.4 oz (65.5 kg)   SpO2 95%   BMI 22.96 kg/m    General: Pleasant female sounding in NAD Psych: Normal affect. Neuro: Alert and oriented X 3.  Lungs:  Resp regular and unlabored while talking on the phone.   ASSESSMENT & PLAN:    1. Persistent Atrial Fibrillation - She underwent DCCV on 11/10/2019 with recurrence at follow-up and a rate-control strategy has been pursued since given severe LA dilation by prior echocardiogram.  - Her rates remain variable from the 70's to 110's but she currently typically checks these at night. I encouraged her to check these at different times throughout the day and report back on readings. I am unable to titrate her Cardizem CD 240mg  or Lopressor 12.5mg  BID currently due to her soft BP. If rates remain elevated, we may need to consider adding Amiodarone and possible repeat DCCV once on antiarrhythmic therapy as she has not tolerated atrial fibrillation well over the past few months.  - No reports of active bleeding. Continue Eliquis 5mg  BID for anticoagulation.   2. Lower Extremity Edema - She reports her symptoms have improved with dose adjustment of Torsemide. Will recheck BMET with upcoming labs as creatinine had trended up from 1.21 to 1.60 when on the higher dose of Torsemide. Continue current dosing for now with 40mg  in AM/20mg  in PM.   3. HTN - BP has been soft as outlined above. Continue current regimen for now with Cardizem CD 240mg  daily and Lopressor  12.5mg  BID. She will call to follow-up on HR and BP in 1-2 weeks.   4. Mitral Regurgitation - Moderate by echo in 10/2019. Will continue to follow.    COVID-19 Education: The signs and symptoms of COVID-19 were discussed with the  patient and how to seek care for testing (follow up with PCP or arrange E-visit). The importance of social distancing was discussed today.  Time:   Today, I have spent 18 minutes with the patient with telehealth technology discussing the above problems.     Medication Adjustments/Labs and Tests Ordered: Current medicines are reviewed at length with the patient today.  Concerns regarding medicines are outlined above.   Tests Ordered: Orders Placed This Encounter  Procedures  . Basic Metabolic Panel (BMET)    Medication Changes: No orders of the defined types were placed in this encounter.   Follow Up: Call to report on HR/BP readings in 1-2 weeks; Follow-up with myself or Dr. Harl Bowie in 6-8 weeks.   Signed, Erma Heritage, PA-C  01/14/2020 5:37 PM    [facility]

## 2020-01-14 NOTE — Patient Instructions (Signed)
Medication Instructions:  Your physician recommends that you continue on your current medications as directed. Please refer to the Current Medication list given to you today.  *If you need a refill on your cardiac medications before your next appointment, please call your pharmacy*   Lab Work: Your physician recommends that you return for lab work [employer] )  If you have labs (blood work) drawn today and your tests are completely normal, you will receive your results only by: Marland Kitchen MyChart Message (if you have MyChart) OR . A paper copy in the mail If you have any lab test that is abnormal or we need to change your treatment, we will call you to review the results.   Testing/Procedures: NONE    Follow-Up: At [facility], you and your health needs are our priority.  As part of our continuing mission to provide you with exceptional heart care, we have created designated Provider Care Teams.  These Care Teams include your primary Cardiologist (physician) and Advanced Practice Providers (APPs -  Physician Assistants and Nurse Practitioners) who all work together to provide you with the care you need, when you need it.  We recommend signing up for the patient portal called "MyChart".  Sign up information is provided on this After Visit Summary.  MyChart is used to connect with patients for Virtual Visits (Telemedicine).  Patients are able to view lab/test results, encounter notes, upcoming appointments, etc.  Non-urgent messages can be sent to your provider as well.   To learn more about what you can do with MyChart, go to [email or web address].    Your next appointment:   6-8 week(s)  The format for your next appointment:   In Person  Provider:   Dina Rich, MD or Randall An, PA-C   Other Instructions Thank you for choosing [facility]!

## 2020-01-14 NOTE — Telephone Encounter (Signed)
Last Visit: 09/03/2019 Next Visit: 02/05/2020 Labs: 12/15/2019, RBC 3.17, Hemoglobin 8.1, HCT 26.7, MCH 25.6, RDW 16.7, Potassium 2.8, Glucose 110, BUN 33, Creatinine 1.21, Calcium 8.7, GFR 44, Ferritin 5, Iron 13, TIBC 513, Saturation Ratios 3 Eye exam: 09/02/2019 WNL  Current Dose per office note 09/04/2019. Plaquenil 200 mg 1 tablet twice daily. 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

## 2020-01-20 ENCOUNTER — Telehealth: Payer: Self-pay | Admitting: Student

## 2020-01-20 DIAGNOSIS — Z79899 Other long term (current) drug therapy: Secondary | ICD-10-CM

## 2020-01-20 MED ORDER — AMIODARONE HCL 200 MG PO TABS: ORAL_TABLET | ORAL | 1 refills | Status: DC

## 2020-01-20 NOTE — Telephone Encounter (Signed)
I spoke with patient and she will increase torsemide to 40 mg twice a day for 3 days and then resume usual dose of 40 mg am/20 mg/pm after.  She will get BMET next week on 1/19  She has agreed to start amiodarone 200 mg twice a day for 3 weeks and then reduce to 200 mg daily   She will have nurse apt in 3 weeks for EKG .

## 2020-01-20 NOTE — Telephone Encounter (Signed)
SOB today started last night. Felt like she was smothering in her sleep last night. Weight: 01/20/20: 148.4,  1/10: 145.8,  01/10/20: 141

## 2020-01-20 NOTE — Telephone Encounter (Signed)
     Please have her take Torsemide 40mg  BID for the next 3 days then resume 40mg  in AM/20mg  in PM.   Given her HR remains elevated and she continues to have fluid accumulation, I reviewed her case with Dr. Harl Bowie and he thinks it is best we try to get her back in normal sinus rhythm since her BP does not allow for titration of HR controlling medications. He recommended we start Amiodarone 200mg  BID x 3 weeks, then 200mg  daily and that she have a nurse visit in 3 weeks for an EKG check. If still in atrial fibrillation, would then consider another DCCV once she has been on Amiodarone. If she has questions, I am happy to answer them once I am back in the office later this week but I think Amiodarone might be our only option at this point.   Thanks,  Erin Schmidt

## 2020-01-20 NOTE — Telephone Encounter (Signed)
BP today: 105/64, HR 105  Has watched diet, states she feels congestion in chest.She is still taking Torsemide 40 mg am and 20 mg pm   She wants to know if she should get BMET now or on the 24 th when she had already planned other lab work.     Please advise

## 2020-01-21 ENCOUNTER — Telehealth: Payer: Self-pay | Admitting: Student

## 2020-01-21 NOTE — Telephone Encounter (Signed)
Spoke with pt explained how to take Torsemide and Amiodarone. Pt read back and voiced understanding.

## 2020-01-21 NOTE — Telephone Encounter (Signed)
Patient needs to confirm the changes on her medication she spoke to [facility] yesterday.
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
## 2020-01-26 ENCOUNTER — Other Ambulatory Visit: Payer: Self-pay | Admitting: *Deleted

## 2020-01-26 NOTE — Patient Outreach (Signed)
[facility]) Care Management  01/26/2020  Erin Schmidt 01-04-1933 [id number]   Outgoing call placed to member, no answer, unable to leave message.  Will follow up within the next 3-4 business days.  Valente David, [place], MSN [place] [phone]

## 2020-01-28 ENCOUNTER — Other Ambulatory Visit (HOSPITAL_COMMUNITY)
Admission: RE | Admit: 2020-01-28 | Discharge: 2020-01-28 | Disposition: A | Discharge status: Home / Self Care | Payer: Medicare Other | Source: Ambulatory Visit | Attending: Student | Admitting: Student

## 2020-01-28 DIAGNOSIS — Z79899 Other long term (current) drug therapy: Secondary | ICD-10-CM | POA: Diagnosis not present

## 2020-01-28 LAB — BASIC METABOLIC PANEL
Anion gap: 8 (ref 5–15)
BUN: 35 mg/dL — ABNORMAL HIGH (ref 8–23)
CO2: 27 mmol/L (ref 22–32)
Calcium: 8.8 mg/dL — ABNORMAL LOW (ref 8.9–10.3)
Chloride: 100 mmol/L (ref 98–111)
Creatinine, Ser: 2.3 mg/dL — ABNORMAL HIGH (ref 0.44–1.00)
GFR, Estimated: 20 mL/min — ABNORMAL LOW (ref >60)
Glucose, Bld: 92 mg/dL (ref 70–99)
Potassium: 4.5 mmol/L (ref 3.5–5.1)
Sodium: 135 mmol/L (ref 135–145)

## 2020-01-29 ENCOUNTER — Telehealth: Payer: Self-pay | Admitting: Student

## 2020-01-29 ENCOUNTER — Other Ambulatory Visit: Payer: Self-pay

## 2020-01-29 DIAGNOSIS — Z79899 Other long term (current) drug therapy: Secondary | ICD-10-CM

## 2020-01-29 NOTE — Telephone Encounter (Signed)
Contacted patient and she verbalized understanding about results and medication instructions. Orders for a BMET were put in for 02/11/20

## 2020-01-29 NOTE — Telephone Encounter (Signed)
Patient is returning a call to [employer] in regards to recent labs.  She has requested to call her after 3:00pm today.

## 2020-01-29 NOTE — Telephone Encounter (Signed)
-----   Message from Erma Heritage, [place] sent at 01/28/2020  4:11 PM EST ----- Please let the patient know that her repeat labs suggest dehydration as her creatinine has trended up from 1.60 to 2.30. Would reduce her Torsemide to 20mg  twice daily and only take an extra tablet if needed for worsening dyspnea. Recheck in 2 weeks to make sure this is improving (can check on the day of her nurse visit on 02/11/2020).

## 2020-01-30 ENCOUNTER — Other Ambulatory Visit: Payer: Self-pay | Admitting: *Deleted

## 2020-01-30 ENCOUNTER — Other Ambulatory Visit (HOSPITAL_COMMUNITY): Payer: Self-pay

## 2020-01-30 DIAGNOSIS — E876 Hypokalemia: Secondary | ICD-10-CM

## 2020-01-30 DIAGNOSIS — D500 Iron deficiency anemia secondary to blood loss (chronic): Secondary | ICD-10-CM

## 2020-01-30 NOTE — Patient Outreach (Signed)
[facility]) Care Management  01/30/2020  Erin Schmidt 09-29-1932 [id number]   Outreach attempt #2, successful.  Report she has some rough days, management of condition has been up and down.  She is still having intermittent shortness of breath and fatigue.  Has been in contact with cardiology office multiple times with medication changes, now taking Torsemide 20 mg twice a day.  This was decreased from 40 mg in the morning and 20 mg in the evening due to to changes in kidney function.  Her weight fluctuates from 141-149 pounds, still has lower extremity swelling.  Blood pressure range 80's-100s/60-80's, HR has remained less than 100.  Has follow up with cancer center on 1/24 and with cardiology on 2/2.  Will have follow up labs done to monitor kidney function and possible adjustment to medications.  She will continue to monitor weight, BP, and HR daily and share trends with MD.  Rosezena Sensor understanding of heart failure zones.  Denies any urgent concerns, encouraged to contact this care manager with questions.  Agrees to follow up within the next month.  Goals Addressed            This Visit's Progress   . [facility] - Track and Manage Activity and Exertion-Heart Failure   On track    Timeframe:  Long-Range Goal Priority:  Medium Start Date:     12/8                        Expected End Date:       02/17/2020             - avoid heavy exercise on very hot days - drink water to stay hydrated during exercise - follow activity or exercise plan - make an activity or exercise plan - track exercise in diary for two weeks - track symptoms during activity in diary    Why is this important?    Exercising is very important when managing your heart failure.   It will help your heart get stronger.    Notes:     . [facility] - Track and Manage Fluids and Swelling-Heart Failure   On track    Timeframe:  Short-Term Goal Priority:  High Start Date:     01/30/2020                         Expected End Date:       03/01/2020                   - call office if I gain more than 2 pounds in one day or 5 pounds in one week - keep legs up while sitting - track weight in diary - use salt in moderation - watch for swelling in feet, ankles and legs every day - weigh myself daily    Why is this important?    It is important to check your weight daily and watch how much salt and liquids you have.   It will help you to manage your heart failure.    Notes:     . [facility] - Track and Manage Heart Rate and Rhythm-Atrial Fibrillation   On track    Timeframe:  Short-Term Goal Priority:  Medium Start Date:        01/30/2020                   Expected End Date:  03/01/2020                     - begin a symptom diary - bring symptom diary to all appointments - check pulse (heart) rate before taking medicine - check pulse (heart) rate once a day - make a plan to exercise regularly - make a plan to eat healthy - keep all lab appointments - take medicine as prescribed    Why is this important?    Atrial fibrillation may have no symptoms. Sometimes the symptoms get worse or happen more often.   It is important to keep track of what your symptoms are and when they happen.   A change in symptoms is important to discuss with your doctor or nurse.   Being active and healthy eating will also help you manage your heart condition.     Notes:       Valente David, RN, MSN [place] [phone]

## 2020-02-02 ENCOUNTER — Inpatient Hospital Stay (HOSPITAL_COMMUNITY): Payer: Medicare Other | Attending: Hematology

## 2020-02-02 ENCOUNTER — Other Ambulatory Visit: Payer: Self-pay

## 2020-02-02 DIAGNOSIS — R7989 Other specified abnormal findings of blood chemistry: Secondary | ICD-10-CM | POA: Diagnosis not present

## 2020-02-02 DIAGNOSIS — E876 Hypokalemia: Secondary | ICD-10-CM

## 2020-02-02 DIAGNOSIS — I959 Hypotension, unspecified: Secondary | ICD-10-CM | POA: Insufficient documentation

## 2020-02-02 DIAGNOSIS — M069 Rheumatoid arthritis, unspecified: Secondary | ICD-10-CM | POA: Diagnosis not present

## 2020-02-02 DIAGNOSIS — N179 Acute kidney failure, unspecified: Secondary | ICD-10-CM | POA: Insufficient documentation

## 2020-02-02 DIAGNOSIS — M1712 Unilateral primary osteoarthritis, left knee: Secondary | ICD-10-CM | POA: Diagnosis not present

## 2020-02-02 DIAGNOSIS — Z7901 Long term (current) use of anticoagulants: Secondary | ICD-10-CM | POA: Insufficient documentation

## 2020-02-02 DIAGNOSIS — I110 Hypertensive heart disease with heart failure: Secondary | ICD-10-CM | POA: Insufficient documentation

## 2020-02-02 DIAGNOSIS — Z79899 Other long term (current) drug therapy: Secondary | ICD-10-CM | POA: Diagnosis not present

## 2020-02-02 DIAGNOSIS — D509 Iron deficiency anemia, unspecified: Secondary | ICD-10-CM | POA: Insufficient documentation

## 2020-02-02 DIAGNOSIS — D500 Iron deficiency anemia secondary to blood loss (chronic): Secondary | ICD-10-CM

## 2020-02-02 DIAGNOSIS — I509 Heart failure, unspecified: Secondary | ICD-10-CM | POA: Insufficient documentation

## 2020-02-02 DIAGNOSIS — I4891 Unspecified atrial fibrillation: Secondary | ICD-10-CM | POA: Insufficient documentation

## 2020-02-02 DIAGNOSIS — E039 Hypothyroidism, unspecified: Secondary | ICD-10-CM | POA: Insufficient documentation

## 2020-02-02 DIAGNOSIS — Z87891 Personal history of nicotine dependence: Secondary | ICD-10-CM | POA: Diagnosis not present

## 2020-02-02 LAB — CBC WITH DIFFERENTIAL/PLATELET
Abs Immature Granulocytes: 0.04 10*3/uL (ref 0.00–0.07)
Basophils Absolute: 0 10*3/uL (ref 0.0–0.1)
Basophils Relative: 1 %
Eosinophils Absolute: 0.2 10*3/uL (ref 0.0–0.5)
Eosinophils Relative: 3 %
HCT: 34.8 % — ABNORMAL LOW (ref 36.0–46.0)
Hemoglobin: 10.5 g/dL — ABNORMAL LOW (ref 12.0–15.0)
Immature Granulocytes: 1 %
Lymphocytes Relative: 19 %
Lymphs Abs: 1.2 10*3/uL (ref 0.7–4.0)
MCH: 28.9 pg (ref 26.0–34.0)
MCHC: 30.2 g/dL (ref 30.0–36.0)
MCV: 95.9 fL (ref 80.0–100.0)
Monocytes Absolute: 0.7 10*3/uL (ref 0.1–1.0)
Monocytes Relative: 11 %
Neutro Abs: 4.1 10*3/uL (ref 1.7–7.7)
Neutrophils Relative %: 65 %
Platelets: 240 10*3/uL (ref 150–400)
RBC: 3.63 MIL/uL — ABNORMAL LOW (ref 3.87–5.11)
RDW: 27.8 % — ABNORMAL HIGH (ref 11.5–15.5)
WBC: 6.3 10*3/uL (ref 4.0–10.5)
nRBC: 0 % (ref 0.0–0.2)

## 2020-02-02 LAB — COMPREHENSIVE METABOLIC PANEL
ALT: 20 U/L (ref 0–44)
AST: 25 U/L (ref 15–41)
Albumin: 3.7 g/dL (ref 3.5–5.0)
Alkaline Phosphatase: 67 U/L (ref 38–126)
Anion gap: 9 (ref 5–15)
BUN: 34 mg/dL — ABNORMAL HIGH (ref 8–23)
CO2: 26 mmol/L (ref 22–32)
Calcium: 8.7 mg/dL — ABNORMAL LOW (ref 8.9–10.3)
Chloride: 102 mmol/L (ref 98–111)
Creatinine, Ser: 2.01 mg/dL — ABNORMAL HIGH (ref 0.44–1.00)
GFR, Estimated: 24 mL/min — ABNORMAL LOW (ref >60)
Glucose, Bld: 99 mg/dL (ref 70–99)
Potassium: 3.8 mmol/L (ref 3.5–5.1)
Sodium: 137 mmol/L (ref 135–145)
Total Bilirubin: 0.7 mg/dL (ref 0.3–1.2)
Total Protein: 6.1 g/dL — ABNORMAL LOW (ref 6.5–8.1)

## 2020-02-03 ENCOUNTER — Inpatient Hospital Stay (HOSPITAL_COMMUNITY): Payer: Medicare Other | Admitting: Oncology

## 2020-02-03 ENCOUNTER — Other Ambulatory Visit: Payer: Self-pay

## 2020-02-03 ENCOUNTER — Other Ambulatory Visit: Payer: Self-pay | Admitting: Cardiology

## 2020-02-03 VITALS — BP 100/66 | HR 88 | Temp 96.9°F | Resp 17 | Wt 153.2 lb

## 2020-02-03 DIAGNOSIS — Z87891 Personal history of nicotine dependence: Secondary | ICD-10-CM | POA: Diagnosis not present

## 2020-02-03 DIAGNOSIS — Z7901 Long term (current) use of anticoagulants: Secondary | ICD-10-CM | POA: Diagnosis not present

## 2020-02-03 DIAGNOSIS — I4891 Unspecified atrial fibrillation: Secondary | ICD-10-CM | POA: Diagnosis not present

## 2020-02-03 DIAGNOSIS — M7989 Other specified soft tissue disorders: Secondary | ICD-10-CM

## 2020-02-03 DIAGNOSIS — M79662 Pain in left lower leg: Secondary | ICD-10-CM

## 2020-02-03 DIAGNOSIS — E876 Hypokalemia: Secondary | ICD-10-CM

## 2020-02-03 DIAGNOSIS — R7989 Other specified abnormal findings of blood chemistry: Secondary | ICD-10-CM | POA: Diagnosis not present

## 2020-02-03 DIAGNOSIS — I509 Heart failure, unspecified: Secondary | ICD-10-CM | POA: Diagnosis not present

## 2020-02-03 DIAGNOSIS — Z79899 Other long term (current) drug therapy: Secondary | ICD-10-CM | POA: Diagnosis not present

## 2020-02-03 DIAGNOSIS — D500 Iron deficiency anemia secondary to blood loss (chronic): Secondary | ICD-10-CM | POA: Diagnosis not present

## 2020-02-03 DIAGNOSIS — D509 Iron deficiency anemia, unspecified: Secondary | ICD-10-CM | POA: Diagnosis not present

## 2020-02-03 DIAGNOSIS — N179 Acute kidney failure, unspecified: Secondary | ICD-10-CM | POA: Diagnosis not present

## 2020-02-03 DIAGNOSIS — M79605 Pain in left leg: Secondary | ICD-10-CM | POA: Diagnosis not present

## 2020-02-03 DIAGNOSIS — I959 Hypotension, unspecified: Secondary | ICD-10-CM | POA: Diagnosis not present

## 2020-02-03 DIAGNOSIS — M1712 Unilateral primary osteoarthritis, left knee: Secondary | ICD-10-CM | POA: Diagnosis not present

## 2020-02-03 DIAGNOSIS — I110 Hypertensive heart disease with heart failure: Secondary | ICD-10-CM | POA: Diagnosis not present

## 2020-02-03 DIAGNOSIS — M069 Rheumatoid arthritis, unspecified: Secondary | ICD-10-CM | POA: Diagnosis not present

## 2020-02-03 DIAGNOSIS — E039 Hypothyroidism, unspecified: Secondary | ICD-10-CM | POA: Diagnosis not present

## 2020-02-03 NOTE — Progress Notes (Signed)
Erin Schmidt, Erin Schmidt [id number]   CLINIC:  Medical Oncology/Hematology  PCP:  Sharilyn Sites, [place] [address] [phone]   REASON FOR VISIT: Follow-up for severe iron deficiency anemia   CURRENT THERAPY: Intermittent iron infusions  INTERVAL HISTORY:  Erin Schmidt 87 y.o. female returns for routine follow-up for severe iron deficiency anemia.   She was last evaluated by hematology on 12/16/19.  She received IV Injectafer last on 12/23/19 and 01/06/20.  She was evaluated at [facility] on 09/25/2019 for symptomatic atrial fibrillation.  She was started on anticoagulation with Eliquis and diltiazem for rate control.  She had a cardioversion performed on 11/10/2019.  She was evaluated again at [facility] on 12/01/2019-12/04/2019 for new onset heart failure.  She was diuresed and sent home on Lasix.  Had 2D echo which showed an EF of 50 to 81% with no diastolic dysfunction.  She was last seen in cardiology on 01/14/2020 by Bernerd Pho, [place].  She was switched recently from Lasix to torsemide secondary to weight gain and increased lower extremity swelling.  Torsemide dose was decreased secondary to rising creatinine.  Today, she reports feeling tired.  She continues to have bilateral lower extremity swelling but left leg is worse than right.  She also notes acute left calf pain.  She is on Eliquis atrial fibrillation.  She has not missed any doses.  She has noticed some low blood pressures recently.  Many of her cardiac medications have been manipulated over the past couple months.  Most recently metoprolol was added.  She has been keeping a log of her blood pressures and started like less than 100 from most every reading.  Had not noticed any recent bleeding such as epistaxis, hematuria or hematochezia. Denies recent chest pain on exertion, shortness of breath on minimal exertion, pre-syncopal episodes, or palpitations. Denies any numbness or  tingling in hands or feet. Denies any recent fevers, infections. Patient reports appetite at 50% and energy level at 50%.  She is eating well maintain her weight at this time.   REVIEW OF SYSTEMS:  Review of Systems  Constitutional: Positive for fatigue. Negative for appetite change, fever and unexpected weight change.  HENT:   Negative for nosebleeds, sore throat and trouble swallowing.   Eyes: Negative.   Respiratory: Positive for shortness of breath. Negative for cough and wheezing.   Cardiovascular: Positive for leg swelling. Negative for chest pain.  Gastrointestinal: Negative for abdominal pain, blood in stool, constipation, diarrhea, nausea and vomiting.  Endocrine: Negative.   Genitourinary: Negative.  Negative for bladder incontinence, hematuria and nocturia.   Musculoskeletal: Negative.  Negative for back pain and flank pain.  Skin: Negative.   Neurological: Negative.  Negative for dizziness, headaches, light-headedness and numbness.  Hematological: Negative.   Psychiatric/Behavioral: Negative.  Negative for confusion. The patient is not nervous/anxious.      PAST MEDICAL/SURGICAL HISTORY:  Past Medical History:  Diagnosis Date  . Arthritis   . Atrial fibrillation ([place])   . Dysrhythmia   . GERD (gastroesophageal reflux disease)   . Hyperlipemia   . Hypertension    preventative measures  . Hypothyroidism   . Iron deficiency anemia 05/07/2019  . Migraine   . Thyroid disease   . Vertigo    Past Surgical History:  Procedure Laterality Date  . APPENDECTOMY    . BREAST BIOPSY Right    benign  . BREAST EXCISIONAL BIOPSY Left    Benign  . BREAST  EXCISIONAL BIOPSY Left    Benign  . BREAST LUMPECTOMY  30 yrs ago   Dr. Milbert Coulter- rt breast  . BREAST LUMPECTOMY  15 yrs ago   left- Dr. Romona Curls  . BREAST LUMPECTOMY  01/2011  . CARDIOVERSION N/A 11/10/2019   Procedure: CARDIOVERSION;  Surgeon: Arnoldo Lenis, MD;  Location: [facility];  Service: Endoscopy;  Laterality: N/A;   . CHOLECYSTECTOMY    . COLONOSCOPY    . COLONOSCOPY  04/13/2011   Procedure: COLONOSCOPY;  Surgeon: Rogene Houston, MD;  Location: [facility];  Service: Endoscopy;  Laterality: N/A;  930  . MASTECTOMY, PARTIAL  01/13/2011   Procedure: MASTECTOMY PARTIAL;  Surgeon: Adin Hector, MD;  Location: [facility];  Service: General;  Laterality: Left;  left partial mastectomy with needle locallization  . VAGINAL HYSTERECTOMY     2 partials     SOCIAL HISTORY:  Social History   Socioeconomic History  . Marital status: Married    Spouse name: Not on file  . Number of children: 2  . Years of education: Not on file  . Highest education level: Not on file  Occupational History  . Occupation: RETIRED  Tobacco Use  . Smoking status: Former Smoker    Packs/day: 1.00    Years: 20.00    Pack years: 20.00    Types: Cigarettes    Quit date: 01/05/1989    Years since quitting: 31.0  . Smokeless tobacco: Never Used  Vaping Use  . Vaping Use: Never used  Substance and Sexual Activity  . Alcohol use: No  . Drug use: Never  . Sexual activity: Not on file  Other Topics Concern  . Not on file  Social History Narrative  . Not on file   Social Determinants of Health   Financial Resource Strain: Not on file  Food Insecurity: No Food Insecurity  . Worried About Charity fundraiser in the Last Year: Never true  . Ran Out of Food in the Last Year: Never true  Transportation Needs: No Transportation Needs  . Lack of Transportation (Medical): No  . Lack of Transportation (Non-Medical): No  Physical Activity: Not on file  Stress: Not on file  Social Connections: Not on file  Intimate Partner Violence: Not on file    FAMILY HISTORY:  Family History  Problem Relation Age of Onset  . Heart disease Mother   . Stroke Sister   . Heart disease Brother   . Atrial fibrillation Sister   . Coronary artery disease Brother   . Lung cancer Son   . Healthy Son   . Colon cancer Neg  Hx     CURRENT MEDICATIONS:  Outpatient Encounter Medications as of 02/03/2020  Medication Sig Note  . amiodarone (PACERONE) 200 MG tablet Take 200 mg twice a day for 3 weeks, then, reduce to 200 mg daily   . Calcium Carbonate-Vitamin D (CALCIUM + D PO) Take 1 tablet by mouth 2 (two) times daily.   . cetirizine (ZYRTEC) 10 MG tablet 1 tablet   . cholecalciferol (VITAMIN D3) 25 MCG (1000 UNIT) tablet Take 25 mcg by mouth. Taking 25 mg daily   . diltiazem (CARDIZEM CD) 240 MG 24 hr capsule Take 1 capsule (240 mg total) by mouth daily.   Marland Kitchen escitalopram (LEXAPRO) 10 MG tablet Take 10 mg by mouth daily.   Marland Kitchen etanercept (ENBREL SURECLICK) 50 MG/ML injection Inject 50 mg into the skin once a week. 12/02/2019: Did not get this  weeks dose due to hospital visit  . hydroxychloroquine (PLAQUENIL) 200 MG tablet TAKE 1 TABLET BY MOUTH TWICE A DAY   . levothyroxine (SYNTHROID, LEVOTHROID) 112 MCG tablet Take 112 mcg by mouth daily before breakfast.    . Melatonin 10 MG CAPS Take 10 mg by mouth at bedtime.   . metoprolol tartrate (LOPRESSOR) 25 MG tablet Take 0.5 tablets (12.5 mg total) by mouth 2 (two) times daily.   Marland Kitchen omeprazole (PRILOSEC) 20 MG capsule Take 20 mg by mouth 3 (three) times a week. Take 1 on Mondays, Wednesdays and Fridays.   . potassium chloride SA (KLOR-CON) 20 MEQ tablet Take 1 tablet (20 mEq total) by mouth 2 (two) times daily.   Marland Kitchen torsemide (DEMADEX) 20 MG tablet Take 2 Tablet ( 40 mg ) in the AM and Take 1 Tablet ( 20 mg ) in the PM   . traMADol-acetaminophen (ULTRACET) 37.5-325 MG tablet Take 1 tablet by mouth in the morning and at bedtime.  11/05/2019: Patient takes with plaquenil doses  . [DISCONTINUED] apixaban (ELIQUIS) 5 MG TABS tablet Take 1 tablet (5 mg total) by mouth 2 (two) times daily.   . meclizine (ANTIVERT) 25 MG tablet Take 25 mg by mouth 3 (three) times daily as needed for dizziness.  (Patient not taking: Reported on 02/03/2020)    No facility-administered encounter  medications on file as of 02/03/2020.    ALLERGIES:  Allergies  Allergen Reactions  . Feraheme [Ferumoxytol]     Reacted during infusion requiring medical intervention. Increased BP     PHYSICAL EXAM:  ECOG Performance status: 1  Vitals:   02/03/20 0905  BP: 100/66  Pulse: 88  Resp: 17  Temp: (!) 96.9 F (36.1 C)  SpO2: 94%   Filed Weights   02/03/20 0905  Weight: 153 lb 3.2 oz (69.5 kg)   Physical Exam Constitutional:      General: Vital signs are normal.     Appearance: Normal appearance.  HENT:     Head: Normocephalic and atraumatic.  Eyes:     Pupils: Pupils are equal, round, and reactive to light.  Cardiovascular:     Rate and Rhythm: Normal rate and regular rhythm.     Heart sounds: Normal heart sounds. No murmur heard.   Pulmonary:     Effort: Pulmonary effort is normal.     Breath sounds: Normal breath sounds. No wheezing.  Abdominal:     General: Bowel sounds are normal. There is no distension.     Palpations: Abdomen is soft.     Tenderness: There is no abdominal tenderness.  Musculoskeletal:        General: Tenderness present. Normal range of motion.     Cervical back: Normal range of motion.     Left lower leg: Edema present.  Skin:    General: Skin is warm and dry.     Findings: No rash.  Neurological:     Mental Status: She is alert and oriented to person, place, and time.  Psychiatric:        Judgment: Judgment normal.      LABORATORY DATA:  I have reviewed the labs as listed.  CBC    Component Value Date/Time   WBC 6.3 02/02/2020 1057   RBC 3.63 (L) 02/02/2020 1057   HGB 10.5 (L) 02/02/2020 1057   HCT 34.8 (L) 02/02/2020 1057   PLT 240 02/02/2020 1057   MCV 95.9 02/02/2020 1057   MCH 28.9 02/02/2020 1057   MCHC 30.2 02/02/2020  1057   RDW 27.8 (H) 02/02/2020 1057   LYMPHSABS 1.2 02/02/2020 1057   MONOABS 0.7 02/02/2020 1057   EOSABS 0.2 02/02/2020 1057   BASOSABS 0.0 02/02/2020 1057   CMP Latest Ref Rng & Units 02/02/2020  01/28/2020 12/22/2019  Glucose 70 - 99 mg/dL 99 92 107(H)  BUN 8 - 23 mg/dL 34(H) 35(H) 38(H)  Creatinine 0.44 - 1.00 mg/dL 2.01(H) 2.30(H) 1.60(H)  Sodium 135 - 145 mmol/L 137 135 135  Potassium 3.5 - 5.1 mmol/L 3.8 4.5 4.2  Chloride 98 - 111 mmol/L 102 100 97(L)  CO2 22 - 32 mmol/L $RemoveB'26 27 29  'vmfpOPWm$ Calcium 8.9 - 10.3 mg/dL 8.7(L) 8.8(L) 8.6(L)  Total Protein 6.5 - 8.1 g/dL 6.1(L) - -  Total Bilirubin 0.3 - 1.2 mg/dL 0.7 - -  Alkaline Phos 38 - 126 U/L 67 - -  AST 15 - 41 U/L 25 - -  ALT 0 - 44 U/L 20 - -    All questions were answered to patient's stated satisfaction. Encouraged patient to call with any new concerns or questions before his next visit to the cancer center and we can certain see him sooner, if needed.     ASSESSMENT & PLAN:  1.  Severe iron deficiency anemia: -Hemoglobin has consistently been low around 10 since October 2020. -Colonoscopy within the last 10 years by Dr. Laural Golden, apparently normal. -Patient denies any bleeding per rectum or melena.  No prior history of blood transfusion. -Work-up labs included B12, folate, copper, and multiple myeloma screening all normal.  Positive stool cards in 05/29/2019. -She cannot tolerate oral iron because of gastric symptoms. -She unfortunately had a reaction to her second Feraheme dose and was switched to IV Injectafer.  Tolerated Injectafer well. -Lab work from 02/02/2020 show a hemoglobin of 10.5 (8.1).  -Ferritin and iron panel drawn. -Based on hemoglobin, ferritin and iron panel are likely greatly improved. -No additional IV iron needed at this time.  2.  Rheumatoid arthritis: -She is on Enbrel weekly in the last 2 years along with Plaquenil twice daily. -Arthritis is fairly well controlled on the current regimen.  Predominantly involves hands.  3. Atrial Fibrillation/congestive heart failure: -Was admitted to the hospital from 12/01/19-12/03/2019 for shortness of breath and lower extremity swelling. -She had recently been  started on diltiazem and Eliquis. -Check 2D echo revealing an EF of 50 to 86% and no diastolic dysfunction. -She was discharged on 10 mg of Lasix daily. -Arrange for cardiology outpatient. -She was switched to torsemide.   4.  Hypokalemia: -Secondary to diuretics. -She was taking 20 mEq potassium supplements twice daily.  She is having trouble swallowing them. -Repeat potassium level today is 3.8. -Okay for her to stop potassium supplements but encouraged high potassium diet.  5. AKI: -Likely secondary to hypovolemia from diuretics -Patient's creatinine is 2.01 today-improvement from last week (2.30). -Baseline creatinine was around 1 prior to December 2021. -Cardiology has been manipulating her medications around quite a bit.  -Recommend increased fluid intake. -She has follow-up with cardiology next week. -Spoke with Bernerd Pho in cardiology and she is aware of elevated creatinine.  She is closely monitoring her and adjusting medications accordingly.  6.  Hypotension: -Secondary to manipulation of blood pressure/chf medications. -Continue to keep an eye on BP readings.   7.  Left knee swelling and tenderness: -Has had bilateral lower extremity for several weeks now. -Left leg is quite a bit more swollen than right and she developed left calf pain a few days  ago. -She is on anticoagulation -Unlikely to be a blood clot but will rule out with ultrasound of left lower extremity  Disposition: -Ultrasound of left lower extremity this week.  -Okay to stop potassium supplements at this time.  Encourage potassium rich diet. -RTC in 3 months for lab work (CBC, CMP, ferritin, iron panel) MD assessment.   Greater than 50% was spent in counseling and coordination of care with this patient including but not limited to discussion of the relevant topics above (See A&P) including, but not limited to diagnosis and management of acute and chronic medical conditions.    No  problem-specific Assessment & Plan notes found for this encounter.  Orders placed this encounter:  Orders Placed This Encounter  Procedures  . US Venous Img Lower Unilateral Left   Faythe Casa, NP 02/03/2020 4:05 PM  [facility] [phone]

## 2020-02-05 ENCOUNTER — Encounter: Payer: Self-pay | Admitting: Physician Assistant

## 2020-02-05 ENCOUNTER — Ambulatory Visit (HOSPITAL_COMMUNITY)
Admission: RE | Admit: 2020-02-05 | Discharge: 2020-02-05 | Disposition: A | Discharge status: Home / Self Care | Payer: Medicare Other | Source: Ambulatory Visit | Attending: Oncology | Admitting: Oncology

## 2020-02-05 ENCOUNTER — Other Ambulatory Visit: Payer: Self-pay

## 2020-02-05 ENCOUNTER — Ambulatory Visit: Payer: Medicare Other | Admitting: Physician Assistant

## 2020-02-05 VITALS — BP 111/69 | HR 111 | Resp 15 | Ht 66.5 in | Wt 153.6 lb

## 2020-02-05 DIAGNOSIS — Z79899 Other long term (current) drug therapy: Secondary | ICD-10-CM

## 2020-02-05 DIAGNOSIS — M170 Bilateral primary osteoarthritis of knee: Secondary | ICD-10-CM | POA: Diagnosis not present

## 2020-02-05 DIAGNOSIS — M19071 Primary osteoarthritis, right ankle and foot: Secondary | ICD-10-CM | POA: Diagnosis not present

## 2020-02-05 DIAGNOSIS — Z8679 Personal history of other diseases of the circulatory system: Secondary | ICD-10-CM

## 2020-02-05 DIAGNOSIS — Z8639 Personal history of other endocrine, nutritional and metabolic disease: Secondary | ICD-10-CM | POA: Diagnosis not present

## 2020-02-05 DIAGNOSIS — Z8659 Personal history of other mental and behavioral disorders: Secondary | ICD-10-CM

## 2020-02-05 DIAGNOSIS — I4891 Unspecified atrial fibrillation: Secondary | ICD-10-CM

## 2020-02-05 DIAGNOSIS — R600 Localized edema: Secondary | ICD-10-CM | POA: Diagnosis not present

## 2020-02-05 DIAGNOSIS — M79605 Pain in left leg: Secondary | ICD-10-CM | POA: Insufficient documentation

## 2020-02-05 DIAGNOSIS — M19041 Primary osteoarthritis, right hand: Secondary | ICD-10-CM

## 2020-02-05 DIAGNOSIS — M0609 Rheumatoid arthritis without rheumatoid factor, multiple sites: Secondary | ICD-10-CM | POA: Diagnosis not present

## 2020-02-05 DIAGNOSIS — I5033 Acute on chronic diastolic (congestive) heart failure: Secondary | ICD-10-CM

## 2020-02-05 DIAGNOSIS — G5702 Lesion of sciatic nerve, left lower limb: Secondary | ICD-10-CM

## 2020-02-05 DIAGNOSIS — M19042 Primary osteoarthritis, left hand: Secondary | ICD-10-CM

## 2020-02-05 DIAGNOSIS — M19072 Primary osteoarthritis, left ankle and foot: Secondary | ICD-10-CM

## 2020-02-05 NOTE — Progress Notes (Signed)
Re: LLE Doppler   BLE swelling- L > R with acute left calf pain.   Ultrasound of left lower extremity is negative for DVT.  Called patient and left a voicemail with results.  Faythe Casa, NP 02/05/2020 10:44 AM

## 2020-02-05 NOTE — Patient Instructions (Signed)
Standing Labs We placed an order today for your standing lab work.   Please have your standing labs drawn in April and every 3 months  If possible, please have your labs drawn 2 weeks prior to your appointment so that the provider can discuss your results at your appointment.  We have open lab daily Monday through Thursday from 8:30-12:30 PM and 1:30-4:30 PM and Friday from 8:30-12:30 PM and 1:30-4:00 PM at the office of Dr. Shaili Deveshwar, [facility].   Please be advised, all patients with office appointments requiring lab work will take precedents over walk-in lab work.  If possible, please come for your lab work on Monday and Friday afternoons, as you may experience shorter wait times. The office is located at [address], [address], [place], [place] [address] No appointment is necessary.   Labs are drawn by [facility]. Please bring your co-pay at the time of your lab draw.  You may receive a bill from [facility] for your lab work.  If you wish to have your labs drawn at another location, please call the office 24 hours in advance to send orders.  If you have any questions regarding directions or hours of operation,  please call [phone].   As a reminder, please drink plenty of water prior to coming for your lab work. Thanks!  

## 2020-02-06 ENCOUNTER — Telehealth: Payer: Self-pay | Admitting: Physician Assistant

## 2020-02-06 ENCOUNTER — Telehealth: Payer: Self-pay | Admitting: Pharmacist

## 2020-02-06 DIAGNOSIS — Z79899 Other long term (current) drug therapy: Secondary | ICD-10-CM

## 2020-02-06 NOTE — Telephone Encounter (Signed)
Submitted a Prior Authorization request to [employer] for [facility] via Cover My Meds. Will update once we receive a response.  Key: [id number] - PA Case ID: [id number]

## 2020-02-06 NOTE — Telephone Encounter (Signed)
Did patient receive an [employer] Patient Assistance application?

## 2020-02-06 NOTE — Telephone Encounter (Signed)
Please start [employer] SQ BIV. Dose: 125mg  every 7 days  Patient has recent diagnosis of CHF, preventing use of TNFi. She's been well-established on Enbrel x 2 years but will change therapies.  Diagnosis: M06.09 - Rheumatoid arthritis of multiple sites without rheumatoid factor   Previously tried and failed therapies: - Methotrexate - inadequate response - Hydroxychloroquine - will be discontinued along with Enbrel d/t recently starting amiodarone for afib - Arava - diarrhea - Cannot try any another TNFi: Humira, Cimzia, Simponi

## 2020-02-06 NOTE — Telephone Encounter (Signed)
Received notification from [facility] regarding a prior authorization for [facility]. Authorization has been APPROVED from 02/06/20 to 01/08/21.   Authorization # [id number]  Ran test claim, patient copay for 1 month supply is $1,634.44. Will mail patient an BMS patient application.  Called patient and advised.

## 2020-02-06 NOTE — Telephone Encounter (Signed)
I called the patient to discuss my conversations with Knox Saliva, RPH and [place], PA-C.   I advised the patient to discontinue Plaquenil for now.  I discussed that it is recommended for her to have a repeat EKG at her next cardiology visit.  If her amiodarone is reduced in the future or discontinued we can discuss restarting on Plaquenil if necessary to control her rheumatoid arthritis.   She was also advised to discontinue Enbrel.  I discussed that the new diagnosis of CHF is likely unrelated to the use of Enbrel but I am concerned that it may worsen or cause recurrent acute exacerbations with continued use.  I discussed that we recommend switching to Orencia which we will further discuss at her follow-up visit and consent will be obtained at that time.  Her dose of Orencia will be 125 mg subcutaneous injections once weekly.  We will apply through her insurance and once approved she will return to the office for administration of the first injection.   All questions were addressed.   Hazel Sams,  PA-C

## 2020-02-11 ENCOUNTER — Telehealth: Payer: Self-pay | Admitting: Student

## 2020-02-11 ENCOUNTER — Other Ambulatory Visit (HOSPITAL_COMMUNITY): Payer: Self-pay | Admitting: Family Medicine

## 2020-02-11 ENCOUNTER — Other Ambulatory Visit (HOSPITAL_COMMUNITY)
Admission: RE | Admit: 2020-02-11 | Discharge: 2020-02-11 | Disposition: A | Discharge status: Home / Self Care | Payer: Medicare Other | Source: Ambulatory Visit | Attending: Student | Admitting: Student

## 2020-02-11 ENCOUNTER — Other Ambulatory Visit (INDEPENDENT_AMBULATORY_CARE_PROVIDER_SITE_OTHER): Payer: Medicare Other | Admitting: *Deleted

## 2020-02-11 ENCOUNTER — Other Ambulatory Visit: Payer: Self-pay

## 2020-02-11 ENCOUNTER — Telehealth: Payer: Self-pay

## 2020-02-11 VITALS — HR 68

## 2020-02-11 DIAGNOSIS — Z1231 Encounter for screening mammogram for malignant neoplasm of breast: Secondary | ICD-10-CM

## 2020-02-11 DIAGNOSIS — I4891 Unspecified atrial fibrillation: Secondary | ICD-10-CM

## 2020-02-11 DIAGNOSIS — Z79899 Other long term (current) drug therapy: Secondary | ICD-10-CM | POA: Diagnosis not present

## 2020-02-11 LAB — BASIC METABOLIC PANEL
Anion gap: 13 (ref 5–15)
BUN: 33 mg/dL — ABNORMAL HIGH (ref 8–23)
CO2: 30 mmol/L (ref 22–32)
Calcium: 9 mg/dL (ref 8.9–10.3)
Chloride: 97 mmol/L — ABNORMAL LOW (ref 98–111)
Creatinine, Ser: 1.42 mg/dL — ABNORMAL HIGH (ref 0.44–1.00)
GFR, Estimated: 36 mL/min — ABNORMAL LOW (ref >60)
Glucose, Bld: 101 mg/dL — ABNORMAL HIGH (ref 70–99)
Potassium: 3.1 mmol/L — ABNORMAL LOW (ref 3.5–5.1)
Sodium: 140 mmol/L (ref 135–145)

## 2020-02-11 MED ORDER — AMIODARONE HCL 200 MG PO TABS: 200.0000 mg | ORAL_TABLET | Freq: Every day | ORAL | 3 refills | Status: DC

## 2020-02-11 NOTE — Telephone Encounter (Signed)
    I talked with the patient at the time of her nurse visit today. Her rates have improved with Amiodarone but she remains in atrial fibrillation. As previously discussed with Dr. Harl Bowie, I reviewed recommendations with the patient today in regards to repeat cardioversion and she is in agreement to proceed with this. She had the procedure before and is aware of risks and benefits. She has not missed any doses of Eliquis. Will see if this can be performed next week pending schedule availability.   She will remain on her current dose of Torsemide and was encouraged to resume potassium supplementation given her hypokalemia. We also reviewed the use of compression stockings due to her lower extremity edema.  Signed, Erma Heritage, PA-C 02/11/2020, 4:56 PM Pager: [phone]

## 2020-02-11 NOTE — Telephone Encounter (Signed)
Patient called to let Apolonio Schneiders know that she still hasn't received the [facility] application.  Patient states she spoke with Apolonio Schneiders after her appointment on 02/05/20 and was told it would be mailed to her.  Patient states if it is easier she can come to the office and fill it out there.  Patient states she has a doctor's appointment this afternoon, but requested a message be left on her voicemail on how to proceed.

## 2020-02-11 NOTE — Telephone Encounter (Signed)
Returned patient's call and advised that application was mailed last week Friday, but it will take some time to get to get to her. Patient will come by office to complete an BMS application, if it does not arrive in the mail in the next couple days.  Advised patient that application will need income documents and pharmacy printout for her household.   Please place BMS [employer] application at front desk for patient.  Thanks!

## 2020-02-11 NOTE — Patient Instructions (Signed)
Medication Instructions:  Take Amiodarone 200 mg daily  *If you need a refill on your cardiac medications before your next appointment, please call your pharmacy*   Lab Work: None today  If you have labs (blood work) drawn today and your tests are completely normal, you will receive your results only by: Marland Kitchen MyChart Message (if you have MyChart) OR . A paper copy in the mail If you have any lab test that is abnormal or we need to change your treatment, we will call you to review the results.   Testing/Procedures: Your physician has recommended that you have a Cardioversion (DCCV) NEXT WEEK WITH DR.BRANCH. Electrical Cardioversion uses a jolt of electricity to your heart either through paddles or wired patches attached to your chest. This is a controlled, usually prescheduled, procedure. Defibrillation is done under light anesthesia in the hospital, and you usually go home the day of the procedure. This is done to get your heart back into a normal rhythm. You are not awake for the procedure. Please see the instruction sheet given to you today.     Follow-Up: At [facility], you and your health needs are our priority.  As part of our continuing mission to provide you with exceptional heart care, we have created designated Provider Care Teams.  These Care Teams include your primary Cardiologist (physician) and Advanced Practice Providers (APPs -  Physician Assistants and Nurse Practitioners) who all work together to provide you with the care you need, when you need it.  Your next appointment:   3 week(s)  The format for your next appointment:   In Person  Provider:   Dr.Branch or Bernerd Pho, PA-C   Other Instructions  None      Thank you for choosing [facility] !

## 2020-02-11 NOTE — Progress Notes (Unsigned)
Seen in office for EKG, DCCV scheduled for next week.

## 2020-02-12 ENCOUNTER — Telehealth: Payer: Self-pay | Admitting: *Deleted

## 2020-02-12 ENCOUNTER — Other Ambulatory Visit: Payer: Self-pay | Admitting: Student

## 2020-02-12 NOTE — Telephone Encounter (Signed)
Called and notified pt of upcoming cardioversion 02/19/20 @ 9:30 am, pre op appt 02/18/20 at 8:30 am and follow up appt. 03/10/20 at 1:00 pm  Pt voiced understanding.

## 2020-02-12 NOTE — Telephone Encounter (Signed)
Patient assistance form placed at front desk for patient.

## 2020-02-12 NOTE — Telephone Encounter (Signed)
This is a [place] pt.  °

## 2020-02-12 NOTE — Telephone Encounter (Signed)
Thx for update.  J Tamala Manzer MD 

## 2020-02-12 NOTE — Patient Instructions (Signed)
Erin Schmidt  02/12/2020     @PREFPERIOPPHARMACY @   Your procedure is scheduled on  02/19/2020.   Report to [facility] at  0800  A.M.  Call this number if you have problems the morning of surgery:  [phone]   Remember:  Do not eat or drink after midnight.                        Take these medicines the morning of surgery with A SIP OF WATER           Diltiazem, amiodarone, lexapro, levothyroxine, antivert (if needed), prilosec, tramadol(if needed).           DO NOT miss any doses of eliquis.    Please brush your teeth.  Do not wear jewelry, make-up or nail polish.  Do not wear lotions, powders, or perfumes, or deodorant.  Do not shave 48 hours prior to surgery.  Men may shave face and neck.  Do not bring valuables to the hospital.  [facility] is not responsible for any belongings or valuables.  Contacts, dentures or bridgework may not be worn into surgery.  Leave your suitcase in the car.  After surgery it may be brought to your room.  For patients admitted to the hospital, discharge time will be determined by your treatment team.  Patients discharged the day of surgery will not be allowed to drive home and must have someone with them for 24 hours.   Special instructions:   DO NOT smoke tobacco or vape the morning of your procedure.   Please read over the following fact sheets that you were given. Coughing and Deep Breathing, Surgical Site Infection Prevention, Anesthesia Post-op Instructions and Care and Recovery After Surgery        Electrical Cardioversion Electrical cardioversion is the delivery of a jolt of electricity to restore a normal rhythm to the heart. A rhythm that is too fast or is not regular keeps the heart from pumping well. In this procedure, sticky patches or metal paddles are placed on the chest to deliver electricity to the heart from a device. This procedure may be done in an emergency if:  There is low or no blood pressure as  a result of the heart rhythm.  Normal rhythm must be restored as fast as possible to protect the brain and heart from further damage.  It may save a life. This may also be a scheduled procedure for irregular or fast heart rhythms that are not immediately life-threatening. Tell a health care provider about:  Any allergies you have.  All medicines you are taking, including vitamins, herbs, eye drops, creams, and over-the-counter medicines.  Any problems you or family members have had with anesthetic medicines.  Any blood disorders you have.  Any surgeries you have had.  Any medical conditions you have.  Whether you are pregnant or may be pregnant. What are the risks? Generally, this is a safe procedure. However, problems may occur, including:  Allergic reactions to medicines.  A blood clot that breaks free and travels to other parts of your body.  The possible return of an abnormal heart rhythm within hours or days after the procedure.  Your heart stopping (cardiac arrest). This is rare. What happens before the procedure? Medicines  Your health care provider may have you start taking: ? Blood-thinning medicines (anticoagulants) so your blood does not clot as easily. ? Medicines to help stabilize your heart  rate and rhythm.  Ask your health care provider about: ? Changing or stopping your regular medicines. This is especially important if you are taking diabetes medicines or blood thinners. ? Taking medicines such as aspirin and ibuprofen. These medicines can thin your blood. Do not take these medicines unless your health care provider tells you to take them. ? Taking over-the-counter medicines, vitamins, herbs, and supplements. General instructions  Follow instructions from your health care provider about eating or drinking restrictions.  Plan to have someone take you home from the hospital or clinic.  If you will be going home right after the procedure, plan to have  someone with you for 24 hours.  Ask your health care provider what steps will be taken to help prevent infection. These may include washing your skin with a germ-killing soap. What happens during the procedure?  An IV will be inserted into one of your veins.  Sticky patches (electrodes) or metal paddles may be placed on your chest.  You will be given a medicine to help you relax (sedative).  An electrical shock will be delivered. The procedure may vary among health care providers and hospitals.   What can I expect after the procedure?  Your blood pressure, heart rate, breathing rate, and blood oxygen level will be monitored until you leave the hospital or clinic.  Your heart rhythm will be watched to make sure it does not change.  You may have some redness on the skin where the shocks were given. Follow these instructions at home:  Do not drive for 24 hours if you were given a sedative during your procedure.  Take over-the-counter and prescription medicines only as told by your health care provider.  Ask your health care provider how to check your pulse. Check it often.  Rest for 48 hours after the procedure or as told by your health care provider.  Avoid or limit your caffeine use as told by your health care provider.  Keep all follow-up visits as told by your health care provider. This is important. Contact a health care provider if:  You feel like your heart is beating too quickly or your pulse is not regular.  You have a serious muscle cramp that does not go away. Get help right away if:  You have discomfort in your chest.  You are dizzy or you feel faint.  You have trouble breathing or you are short of breath.  Your speech is slurred.  You have trouble moving an arm or leg on one side of your body.  Your fingers or toes turn cold or blue. Summary  Electrical cardioversion is the delivery of a jolt of electricity to restore a normal rhythm to the  heart.  This procedure may be done right away in an emergency or may be a scheduled procedure if the condition is not an emergency.  Generally, this is a safe procedure.  After the procedure, check your pulse often as told by your health care provider. This information is not intended to replace advice given to you by your health care provider. Make sure you discuss any questions you have with your health care provider. Document Revised: 07/29/2018 Document Reviewed: 07/29/2018 Elsevier Patient Education  2021 [employer] After This sheet gives you information about how to care for yourself after your procedure. Your health care provider may also give you more specific instructions. If you have problems or questions, contact your health care provider. What can I expect  after the procedure? After the procedure, it is common to have:  Tiredness.  Forgetfulness about what happened after the procedure.  Impaired judgment for important decisions.  Nausea or vomiting.  Some difficulty with balance. Follow these instructions at home: For the time period you were told by your health care provider:  Rest as needed.  Do not participate in activities where you could fall or become injured.  Do not drive or use machinery.  Do not drink alcohol.  Do not take sleeping pills or medicines that cause drowsiness.  Do not make important decisions or sign legal documents.  Do not take care of children on your own.      Eating and drinking  Follow the diet that is recommended by your health care provider.  Drink enough fluid to keep your urine pale yellow.  If you vomit: ? Drink water, juice, or soup when you can drink without vomiting. ? Make sure you have little or no nausea before eating solid foods. General instructions  Have a responsible adult stay with you for the time you are told. It is important to have someone help care for you until you  are awake and alert.  Take over-the-counter and prescription medicines only as told by your health care provider.  If you have sleep apnea, surgery and certain medicines can increase your risk for breathing problems. Follow instructions from your health care provider about wearing your sleep device: ? Anytime you are sleeping, including during daytime naps. ? While taking prescription pain medicines, sleeping medicines, or medicines that make you drowsy.  Avoid smoking.  Keep all follow-up visits as told by your health care provider. This is important. Contact a health care provider if:  You keep feeling nauseous or you keep vomiting.  You feel light-headed.  You are still sleepy or having trouble with balance after 24 hours.  You develop a rash.  You have a fever.  You have redness or swelling around the IV site. Get help right away if:  You have trouble breathing.  You have new-onset confusion at home. Summary  For several hours after your procedure, you may feel tired. You may also be forgetful and have poor judgment.  Have a responsible adult stay with you for the time you are told. It is important to have someone help care for you until you are awake and alert.  Rest as told. Do not drive or operate machinery. Do not drink alcohol or take sleeping pills.  Get help right away if you have trouble breathing, or if you suddenly become confused. This information is not intended to replace advice given to you by your health care provider. Make sure you discuss any questions you have with your health care provider. Document Revised: 09/11/2019 Document Reviewed: 11/28/2018 Elsevier Patient Education  2021 [employer].

## 2020-02-16 NOTE — Telephone Encounter (Signed)
Submitted Patient Assistance Application to [employer] for [facility] along with provider portion, PA and income documents. Will update patient when we receive a response.  Fax# [phone] Phone# [phone]  Knox Saliva, PharmD, MPH Clinical Pharmacist (Rheumatology and Pulmonology)

## 2020-02-18 ENCOUNTER — Encounter (HOSPITAL_COMMUNITY): Payer: Self-pay

## 2020-02-18 ENCOUNTER — Other Ambulatory Visit: Payer: Self-pay

## 2020-02-18 ENCOUNTER — Encounter (HOSPITAL_COMMUNITY)
Admission: RE | Admit: 2020-02-18 | Discharge: 2020-02-18 | Disposition: A | Discharge status: Home / Self Care | Payer: Medicare Other | Source: Ambulatory Visit | Attending: Cardiology | Admitting: Cardiology

## 2020-02-18 ENCOUNTER — Other Ambulatory Visit (HOSPITAL_COMMUNITY)
Admission: RE | Admit: 2020-02-18 | Discharge: 2020-02-18 | Disposition: A | Discharge status: Home / Self Care | Payer: Medicare Other | Source: Ambulatory Visit | Attending: Cardiology | Admitting: Cardiology

## 2020-02-18 DIAGNOSIS — Z20822 Contact with and (suspected) exposure to covid-19: Secondary | ICD-10-CM | POA: Insufficient documentation

## 2020-02-18 DIAGNOSIS — Z01812 Encounter for preprocedural laboratory examination: Secondary | ICD-10-CM | POA: Insufficient documentation

## 2020-02-18 LAB — BASIC METABOLIC PANEL
Anion gap: 8 (ref 5–15)
BUN: 34 mg/dL — ABNORMAL HIGH (ref 8–23)
CO2: 28 mmol/L (ref 22–32)
Calcium: 8.9 mg/dL (ref 8.9–10.3)
Chloride: 101 mmol/L (ref 98–111)
Creatinine, Ser: 1.75 mg/dL — ABNORMAL HIGH (ref 0.44–1.00)
GFR, Estimated: 28 mL/min — ABNORMAL LOW (ref >60)
Glucose, Bld: 90 mg/dL (ref 70–99)
Potassium: 3.3 mmol/L — ABNORMAL LOW (ref 3.5–5.1)
Sodium: 137 mmol/L (ref 135–145)

## 2020-02-18 LAB — SARS CORONAVIRUS 2 (TAT 6-24 HRS): SARS Coronavirus 2: NEGATIVE

## 2020-02-19 ENCOUNTER — Telehealth: Payer: Self-pay | Admitting: *Deleted

## 2020-02-19 ENCOUNTER — Encounter (HOSPITAL_COMMUNITY)
Admission: RE | Disposition: A | Discharge status: Home / Self Care | Payer: Self-pay | Source: Home / Self Care | Attending: Cardiology

## 2020-02-19 ENCOUNTER — Ambulatory Visit (HOSPITAL_COMMUNITY): Payer: Medicare Other | Admitting: Anesthesiology

## 2020-02-19 ENCOUNTER — Encounter (HOSPITAL_COMMUNITY): Payer: Self-pay | Admitting: Cardiology

## 2020-02-19 ENCOUNTER — Ambulatory Visit (HOSPITAL_COMMUNITY)
Admission: RE | Admit: 2020-02-19 | Discharge: 2020-02-19 | Disposition: A | Discharge status: Home / Self Care | Payer: Medicare Other | Attending: Cardiology | Admitting: Cardiology

## 2020-02-19 DIAGNOSIS — Z79899 Other long term (current) drug therapy: Secondary | ICD-10-CM | POA: Diagnosis not present

## 2020-02-19 DIAGNOSIS — I4819 Other persistent atrial fibrillation: Secondary | ICD-10-CM | POA: Insufficient documentation

## 2020-02-19 DIAGNOSIS — Z7901 Long term (current) use of anticoagulants: Secondary | ICD-10-CM | POA: Insufficient documentation

## 2020-02-19 DIAGNOSIS — E039 Hypothyroidism, unspecified: Secondary | ICD-10-CM | POA: Diagnosis not present

## 2020-02-19 DIAGNOSIS — Z87891 Personal history of nicotine dependence: Secondary | ICD-10-CM | POA: Diagnosis not present

## 2020-02-19 DIAGNOSIS — Z79890 Hormone replacement therapy (postmenopausal): Secondary | ICD-10-CM | POA: Insufficient documentation

## 2020-02-19 HISTORY — PX: CARDIOVERSION: SHX1299

## 2020-02-19 SURGERY — CARDIOVERSION
Anesthesia: General

## 2020-02-19 MED ORDER — ORAL CARE MOUTH RINSE: 15.0000 mL | Freq: Once | OROMUCOSAL | Status: AC

## 2020-02-19 MED ORDER — ONDANSETRON HCL 4 MG/2ML IJ SOLN
INTRAMUSCULAR | Status: AC
  Filled 2020-02-19: qty 4

## 2020-02-19 MED ORDER — TORSEMIDE 20 MG PO TABS: 40.0000 mg | ORAL_TABLET | Freq: Every day | ORAL | 3 refills | Status: DC

## 2020-02-19 MED ORDER — POTASSIUM CHLORIDE CRYS ER 20 MEQ PO TBCR: EXTENDED_RELEASE_TABLET | ORAL | 6 refills | Status: DC

## 2020-02-19 MED ORDER — PROPOFOL 10 MG/ML IV BOLUS
INTRAVENOUS | Status: AC
  Filled 2020-02-19: qty 40

## 2020-02-19 MED ORDER — CHLORHEXIDINE GLUCONATE 0.12 % MT SOLN
15.0000 mL | Freq: Once | OROMUCOSAL | Status: AC
  Administered 2020-02-19: 15 mL via OROMUCOSAL

## 2020-02-19 MED ORDER — PROPOFOL 10 MG/ML IV BOLUS
INTRAVENOUS | Status: DC | PRN
  Administered 2020-02-19: 60 mg via INTRAVENOUS
  Administered 2020-02-19: 10 mg via INTRAVENOUS

## 2020-02-19 MED ORDER — KETAMINE HCL 50 MG/5ML IJ SOSY
PREFILLED_SYRINGE | INTRAMUSCULAR | Status: AC
  Filled 2020-02-19: qty 5

## 2020-02-19 MED ORDER — PROPOFOL 10 MG/ML IV BOLUS
INTRAVENOUS | Status: AC
  Filled 2020-02-19: qty 20

## 2020-02-19 MED ORDER — LACTATED RINGERS IV SOLN
INTRAVENOUS | Status: DC
  Administered 2020-02-19: 1000 mL via INTRAVENOUS

## 2020-02-19 NOTE — Telephone Encounter (Signed)
-----   Message from Arnoldo Lenis, MD sent at 02/19/2020 11:53 AM EST ----- Can we call this patient and have her stop her metoprolol. Have her lower her torsemide to 40mg  daily. Verify she has been taking KCl 42mEq bid, if so increase to 45mEq in am and 29mEq in PM. Needs BMET/Mg in 1 weeks, also nursing visit on that day for EKG check and vitals   Zandra Abts MD

## 2020-02-19 NOTE — H&P (Signed)
Procedure H&P  Patient presents for outpatient elective cardioversion for atrial fibrillation. She has been on eliquis at home for anticoagulation. Please refer to referenced recent clinic note below for full history. Plan for cardioversion today with the assistance of anesthesiology. Labs show some uptrend in Cr, will lower her home toresemide and also increase potassium supplement.   Erin Dolly MD   Date:  01/14/2020   ID:  Erin Schmidt, DOB 09-27-1932, MRN [id number] The patient was identified using 2 identifiers.  Patient Location: Home Provider Location: Office/Clinic  PCP:  Sharilyn Sites, MD        Cardiologist:  Erin Dolly, MD  Electrophysiologist:  None   Evaluation Performed:  Follow-Up Visit  Chief Complaint: 54-month visit  History of Present Illness:    Erin Schmidt is a 87 y.o. female with past medical historyof persistent atrial fibrillation(s/p DCCV on 11/10/2019 with recurrence at follow-up --> rate-control pursued given severe LA dilation),moderate MR,HTN, hypothyroidism and iron deficiency anemiawho presents for a 28-month follow-up telehealth visit.   She was last examined myself on 12/17/2019 and had been taking Lasix 60 mg twice daily for several days with minimal improvement in her edema or dyspnea. Lasix was therefore discontinued and she was switched to Torsemide 40 mg twice daily. She did call the office in the interim and reported her weight had decreased by 3 pounds and she was experiencing leg weakness, therefore Torsemide was reduced to 40 mg twice daily. Repeat labs on 12/22/2019 showed that her creatinine had increased from 1.21 to 1.60 and Torsemide was reduced to 40 mg daily.  She most recently called the office on 12/29/2019 reporting that her weight had increased by 2 pounds and she had experienced worsening dyspnea, therefore Torsemide was increased to 40 mg in AM/20mg  in PM.   In talking with the patient today, she  reports her orthopnea and dyspnea improved with dose adjustment of Torsemide and symptoms have been stable since. HR has been variable from the 70's to 110's when checked at home. She denies any pitting edema but says her legs feel tight at times. No recent chest pain or tachy-palpitations. Her BP was soft at 88/61 on most recent check and she denies any lightheadedness or dizziness. SBP is typically in the 90's to 110's and she checks her vitals at night.   She did switch to a Virtual Visit today due to a COVID exposure. She denies any symptoms concerning for COVID-19 infection (fever, chills, cough, or new shortness of breath).        Past Medical History:  Diagnosis Date  . Arthritis   . Atrial fibrillation ([place])   . Dysrhythmia   . GERD (gastroesophageal reflux disease)   . Hyperlipemia   . Hypertension    preventative measures  . Hypothyroidism   . Iron deficiency anemia 05/07/2019  . Migraine   . Thyroid disease   . Vertigo         Past Surgical History:  Procedure Laterality Date  . APPENDECTOMY    . BREAST BIOPSY Right    benign  . BREAST EXCISIONAL BIOPSY Left    Benign  . BREAST EXCISIONAL BIOPSY Left    Benign  . BREAST LUMPECTOMY  30 yrs ago   Dr. Milbert Coulter- rt breast  . BREAST LUMPECTOMY  15 yrs ago   left- Dr. Romona Curls  . BREAST LUMPECTOMY  01/2011  . CARDIOVERSION N/A 11/10/2019   Procedure: CARDIOVERSION;  Surgeon: Arnoldo Lenis, MD;  Location: [facility];  Service: Endoscopy;  Laterality: N/A;  . CHOLECYSTECTOMY    . COLONOSCOPY    . COLONOSCOPY  04/13/2011   Procedure: COLONOSCOPY;  Surgeon: Rogene Houston, MD;  Location: [facility];  Service: Endoscopy;  Laterality: N/A;  930  . MASTECTOMY, PARTIAL  01/13/2011   Procedure: MASTECTOMY PARTIAL;  Surgeon: Adin Hector, MD;  Location: [place];  Service: General;  Laterality: Left;  left partial mastectomy with needle locallization  . VAGINAL HYSTERECTOMY      2 partials     Active Medications      Current Meds  Medication Sig  . apixaban (ELIQUIS) 5 MG TABS tablet Take 1 tablet (5 mg total) by mouth 2 (two) times daily.  . Calcium Carbonate-Vitamin D (CALCIUM + D PO) Take 1 tablet by mouth 2 (two) times daily.  . cetirizine (ZYRTEC) 10 MG tablet 1 tablet  . cholecalciferol (VITAMIN D3) 25 MCG (1000 UNIT) tablet Take 25 mcg by mouth. Taking 25 mg daily  . diltiazem (CARDIZEM CD) 240 MG 24 hr capsule Take 1 capsule (240 mg total) by mouth daily.  Marland Kitchen escitalopram (LEXAPRO) 10 MG tablet Take 10 mg by mouth daily.  Marland Kitchen etanercept (ENBREL SURECLICK) 50 MG/ML injection Inject 50 mg into the skin once a week.  . hydroxychloroquine (PLAQUENIL) 200 MG tablet TAKE 1 TABLET BY MOUTH TWICE A DAY  . levothyroxine (SYNTHROID, LEVOTHROID) 112 MCG tablet Take 112 mcg by mouth daily before breakfast.   . meclizine (ANTIVERT) 25 MG tablet Take 25 mg by mouth 3 (three) times daily as needed for dizziness.   . Melatonin 10 MG CAPS Take 10 mg by mouth at bedtime.  . metoprolol tartrate (LOPRESSOR) 25 MG tablet Take 0.5 tablets (12.5 mg total) by mouth 2 (two) times daily.  Marland Kitchen omeprazole (PRILOSEC) 20 MG capsule Take 20 mg by mouth 3 (three) times a week. Take 1 on Mondays, Wednesdays and Fridays.  . potassium chloride SA (KLOR-CON) 20 MEQ tablet Take 1 tablet (20 mEq total) by mouth 2 (two) times daily.  Marland Kitchen torsemide (DEMADEX) 20 MG tablet Take 2 Tablet ( 40 mg ) in the AM and Take 1 Tablet ( 20 mg ) in the PM  . traMADol-acetaminophen (ULTRACET) 37.5-325 MG tablet Take 1 tablet by mouth in the morning and at bedtime.        Allergies:   Feraheme [ferumoxytol]   Social History        Tobacco Use  . Smoking status: Former Smoker    Packs/day: 1.00    Years: 20.00    Pack years: 20.00    Types: Cigarettes    Quit date: 01/05/1989    Years since quitting: 31.0  . Smokeless tobacco: Never Used  Vaping Use  . Vaping Use: Never used   Substance Use Topics  . Alcohol use: No  . Drug use: Never     Family Hx: The patient's family history includes Atrial fibrillation in her sister; Coronary artery disease in her brother; Healthy in her son; Heart disease in her brother and mother; Lung cancer in her son; Stroke in her sister. There is no history of Colon cancer.  ROS:   Please see the history of present illness.     All other systems reviewed and are negative.   Prior CV studies:   The following studies were reviewed today:  Echocardiogram: 10/16/2019 IMPRESSIONS    1. Left ventricular ejection fraction, by estimation, is 50 to 55%. The  left ventricle has  normal function. The left ventricle has no regional  wall motion abnormalities. Left ventricular diastolic parameters are  indeterminate.  2. Right ventricular systolic function is normal. The right ventricular  size is normal. There is mildly elevated pulmonary artery systolic  pressure.  3. Left atrial size was severely dilated.  4. Right atrial size was moderately dilated.  5. The mitral valve is normal in structure. Moderate mitral valve  regurgitation. No evidence of mitral stenosis.  6. Tricuspid valve regurgitation is mild to moderate.  7. The aortic valve is tricuspid. Aortic valve regurgitation is not  visualized. No aortic stenosis is present.  8. The inferior vena cava is dilated in size with <50% respiratory  variability, suggesting right atrial pressure of 15 mmHg.    Labs/Other Tests and Data Reviewed:    EKG:  An ECG dated 12/11/2019 was personally reviewed today and demonstrated: Atrial fibrillation, HR 103 with known RBBB.   Recent Labs: 12/01/2019: B Natriuretic Peptide 701.0; TSH 3.486 12/03/2019: Magnesium 2.1 12/15/2019: ALT 18; Hemoglobin 8.1; Platelets 300 12/22/2019: BUN 38; Creatinine, Ser 1.60; Potassium 4.2; Sodium 135   Recent Lipid Panel Labs (Brief)  No results found for: CHOL, TRIG, HDL, CHOLHDL,  LDLCALC, LDLDIRECT       Wt Readings from Last 3 Encounters:  01/14/20 144 lb 6.4 oz (65.5 kg)  12/17/19 154 lb (69.9 kg)  12/11/19 153 lb (69.4 kg)      Objective:    Vital Signs:  BP (!) 88/61   Pulse (!) 105   Wt 144 lb 6.4 oz (65.5 kg)   SpO2 95%   BMI 22.96 kg/m    General: Pleasant female sounding in NAD Psych: Normal affect. Neuro: Alert and oriented X 3.  Lungs:  Resp regular and unlabored while talking on the phone.   ASSESSMENT & PLAN:    1. Persistent Atrial Fibrillation - She underwent DCCV on 11/10/2019 with recurrence at follow-up and a rate-control strategy has been pursued since given severe LA dilation by prior echocardiogram.  - Her rates remain variable from the 70's to 110's but she currently typically checks these at night. I encouraged her to check these at different times throughout the day and report back on readings. I am unable to titrate her Cardizem CD 240mg  or Lopressor 12.5mg  BID currently due to her soft BP. If rates remain elevated, we may need to consider adding Amiodarone and possible repeat DCCV once on antiarrhythmic therapy as she has not tolerated atrial fibrillation well over the past few months.  - No reports of active bleeding. Continue Eliquis 5mg  BID for anticoagulation.   2. Lower Extremity Edema - She reports her symptoms have improved with dose adjustment of Torsemide. Will recheck BMET with upcoming labs as creatinine had trended up from 1.21 to 1.60 when on the higher dose of Torsemide. Continue current dosing for now with 40mg  in AM/20mg  in PM.   3. HTN - BP has been soft as outlined above. Continue current regimen for now with Cardizem CD 240mg  daily and Lopressor 12.5mg  BID. She will call to follow-up on HR and BP in 1-2 weeks.   4. Mitral Regurgitation - Moderate by echo in 10/2019. Will continue to follow.    COVID-19 Education: The signs and symptoms of COVID-19 were discussed with the patient and how to seek  care for testing (follow up with PCP or arrange E-visit). The importance of social distancing was discussed today.  Time:   Today, I have spent 18 minutes with the  patient with telehealth technology discussing the above problems.     Medication Adjustments/Labs and Tests Ordered: Current medicines are reviewed at length with the patient today.  Concerns regarding medicines are outlined above.   Tests Ordered:    Orders Placed This Encounter  Procedures  . Basic Metabolic Panel (BMET)    Medication Changes: No orders of the defined types were placed in this encounter.   Follow Up: Call to report on HR/BP readings in 1-2 weeks; Follow-up with myself or Dr. Harl Bowie in 6-8 weeks.   Signed, Erma Heritage, PA-C  01/14/2020 5:37 PM    [facility]

## 2020-02-19 NOTE — Anesthesia Preprocedure Evaluation (Signed)
Anesthesia Evaluation  Patient identified by MRN, date of birth, ID band Patient awake    Reviewed: Allergy & Precautions, H&P , NPO status , Patient's Chart, lab work & pertinent test results, reviewed documented beta blocker date and time   Airway Mallampati: II  TM Distance: >3 FB Neck ROM: full    Dental no notable dental hx. (+) Teeth Intact   Pulmonary neg pulmonary ROS, former smoker,    Pulmonary exam normal breath sounds clear to auscultation       Cardiovascular Exercise Tolerance: Good hypertension, + dysrhythmias Atrial Fibrillation  Rhythm:regular Rate:Normal     Neuro/Psych  Headaches,  Neuromuscular disease negative psych ROS   GI/Hepatic Neg liver ROS, GERD  Medicated,  Endo/Other  Hypothyroidism   Renal/GU negative Renal ROS  negative genitourinary   Musculoskeletal   Abdominal   Peds  Hematology  (+) Blood dyscrasia, anemia ,   Anesthesia Other Findings   Reproductive/Obstetrics negative OB ROS                             Anesthesia Physical  Anesthesia Plan  ASA: III  Anesthesia Plan: General   Post-op Pain Management:    Induction:   PONV Risk Score and Plan: Propofol infusion  Airway Management Planned:   Additional Equipment:   Intra-op Plan:   Post-operative Plan:   Informed Consent: I have reviewed the patients History and Physical, chart, labs and discussed the procedure including the risks, benefits and alternatives for the proposed anesthesia with the patient or authorized representative who has indicated his/her understanding and acceptance.     Dental Advisory Given  Plan Discussed with: CRNA  Anesthesia Plan Comments:         Anesthesia Quick Evaluation

## 2020-02-19 NOTE — Telephone Encounter (Signed)
Orders placed, appt made and pt notified

## 2020-02-19 NOTE — Transfer of Care (Signed)
Immediate Anesthesia Transfer of Care Note  Patient: Erin Schmidt  Procedure(s) Performed: CARDIOVERSION (N/A )  Patient Location: PACU  Anesthesia Type:General  Level of Consciousness: awake  Airway & Oxygen Therapy: Patient Spontanous Breathing  Post-op Assessment: Report given to RN  Post vital signs: Reviewed and stable  Last Vitals:  Vitals Value Taken Time  BP    Temp    Pulse    Resp    SpO2      Last Pain:  Vitals:   02/19/20 0822  TempSrc: Oral  PainSc: 0-No pain      Patients Stated Pain Goal: 7 (89/21/19 4174)  Complications: No complications documented.

## 2020-02-19 NOTE — Anesthesia Postprocedure Evaluation (Signed)
Anesthesia Post Note  Patient: Erin Schmidt  Procedure(s) Performed: CARDIOVERSION (N/A )  Patient location during evaluation: PACU Anesthesia Type: General Level of consciousness: awake and alert and oriented Pain management: pain level controlled Vital Signs Assessment: post-procedure vital signs reviewed and stable Respiratory status: spontaneous breathing Cardiovascular status: blood pressure returned to baseline and stable Postop Assessment: no apparent nausea or vomiting Anesthetic complications: no   No complications documented.   Last Vitals:  Vitals:   02/19/20 1000 02/19/20 1015  BP: (!) 108/54 (!) 108/52  Pulse: (!) 56 (!) 56  Resp: 13 16  Temp:    SpO2: 94%     Last Pain:  Vitals:   02/19/20 1000  TempSrc:   PainSc: 0-No pain                 Zayne Marovich

## 2020-02-19 NOTE — Discharge Instructions (Signed)
Electrical Cardioversion Electrical cardioversion is the delivery of a jolt of electricity to restore a normal rhythm to the heart. A rhythm that is too fast or is not regular keeps the heart from pumping well. In this procedure, sticky patches or metal paddles are placed on the chest to deliver electricity to the heart from a device. This procedure may be done in an emergency if:  There is low or no blood pressure as a result of the heart rhythm.  Normal rhythm must be restored as fast as possible to protect the brain and heart from further damage.  It may save a life. This may also be a scheduled procedure for irregular or fast heart rhythms that are not immediately life-threatening. Tell a health care provider about:  Any allergies you have.  All medicines you are taking, including vitamins, herbs, eye drops, creams, and over-the-counter medicines.  Any problems you or family members have had with anesthetic medicines.  Any blood disorders you have.  Any surgeries you have had.  Any medical conditions you have.  Whether you are pregnant or may be pregnant. What are the risks? Generally, this is a safe procedure. However, problems may occur, including:  Allergic reactions to medicines.  A blood clot that breaks free and travels to other parts of your body.  The possible return of an abnormal heart rhythm within hours or days after the procedure.  Your heart stopping (cardiac arrest). This is rare. What happens before the procedure? Medicines  Your health care provider may have you start taking: ? Blood-thinning medicines (anticoagulants) so your blood does not clot as easily. ? Medicines to help stabilize your heart rate and rhythm.  Ask your health care provider about: ? Changing or stopping your regular medicines. This is especially important if you are taking diabetes medicines or blood thinners. ? Taking medicines such as aspirin and ibuprofen. These medicines can  thin your blood. Do not take these medicines unless your health care provider tells you to take them. ? Taking over-the-counter medicines, vitamins, herbs, and supplements. General instructions  Follow instructions from your health care provider about eating or drinking restrictions.  Plan to have someone take you home from the hospital or clinic.  If you will be going home right after the procedure, plan to have someone with you for 24 hours.  Ask your health care provider what steps will be taken to help prevent infection. These may include washing your skin with a germ-killing soap. What happens during the procedure?  An IV will be inserted into one of your veins.  Sticky patches (electrodes) or metal paddles may be placed on your chest.  You will be given a medicine to help you relax (sedative).  An electrical shock will be delivered. The procedure may vary among health care providers and hospitals.   What can I expect after the procedure?  Your blood pressure, heart rate, breathing rate, and blood oxygen level will be monitored until you leave the hospital or clinic.  Your heart rhythm will be watched to make sure it does not change.  You may have some redness on the skin where the shocks were given. Follow these instructions at home:  Do not drive for 24 hours if you were given a sedative during your procedure.  Take over-the-counter and prescription medicines only as told by your health care provider.  Ask your health care provider how to check your pulse. Check it often.  Rest for 48 hours after the procedure  or as told by your health care provider.  Avoid or limit your caffeine use as told by your health care provider.  Keep all follow-up visits as told by your health care provider. This is important. Contact a health care provider if:  You feel like your heart is beating too quickly or your pulse is not regular.  You have a serious muscle cramp that does not go  away. Get help right away if:  You have discomfort in your chest.  You are dizzy or you feel faint.  You have trouble breathing or you are short of breath.  Your speech is slurred.  You have trouble moving an arm or leg on one side of your body.  Your fingers or toes turn cold or blue. Summary  Electrical cardioversion is the delivery of a jolt of electricity to restore a normal rhythm to the heart.  This procedure may be done right away in an emergency or may be a scheduled procedure if the condition is not an emergency.  Generally, this is a safe procedure.  After the procedure, check your pulse often as told by your health care provider. This information is not intended to replace advice given to you by your health care provider. Make sure you discuss any questions you have with your health care provider. Document Revised: 07/29/2018 Document Reviewed: 07/29/2018 Elsevier Patient Education  [facility] Anesthesia, Adult, Care After This sheet gives you information about how to care for yourself after your procedure. Your health care provider may also give you more specific instructions. If you have problems or questions, contact your health care provider. What can I expect after the procedure? After the procedure, the following side effects are common:  Pain or discomfort at the IV site.  Nausea.  Vomiting.  Sore throat.  Trouble concentrating.  Feeling cold or chills.  Feeling weak or tired.  Sleepiness and fatigue.  Soreness and body aches. These side effects can affect parts of the body that were not involved in surgery. Follow these instructions at home: For the time period you were told by your health care provider:  Rest.  Do not participate in activities where you could fall or become injured.  Do not drive or use machinery.  Do not drink alcohol.  Do not take sleeping pills or medicines that cause drowsiness.  Do not make  important decisions or sign legal documents.  Do not take care of children on your own.   Eating and drinking  Follow any instructions from your health care provider about eating or drinking restrictions.  When you feel hungry, start by eating small amounts of foods that are soft and easy to digest (bland), such as toast. Gradually return to your regular diet.  Drink enough fluid to keep your urine pale yellow.  If you vomit, rehydrate by drinking water, juice, or clear broth. General instructions  If you have sleep apnea, surgery and certain medicines can increase your risk for breathing problems. Follow instructions from your health care provider about wearing your sleep device: ? Anytime you are sleeping, including during daytime naps. ? While taking prescription pain medicines, sleeping medicines, or medicines that make you drowsy.  Have a responsible adult stay with you for the time you are told. It is important to have someone help care for you until you are awake and alert.  Return to your normal activities as told by your health care provider. Ask your health care  provider what activities are safe for you.  Take over-the-counter and prescription medicines only as told by your health care provider.  If you smoke, do not smoke without supervision.  Keep all follow-up visits as told by your health care provider. This is important. Contact a health care provider if:  You have nausea or vomiting that does not get better with medicine.  You cannot eat or drink without vomiting.  You have pain that does not get better with medicine.  You are unable to pass urine.  You develop a skin rash.  You have a fever.  You have redness around your IV site that gets worse. Get help right away if:  You have difficulty breathing.  You have chest pain.  You have blood in your urine or stool, or you vomit blood. Summary  After the procedure, it is common to have a sore throat or  nausea. It is also common to feel tired.  Have a responsible adult stay with you for the time you are told. It is important to have someone help care for you until you are awake and alert.  When you feel hungry, start by eating small amounts of foods that are soft and easy to digest (bland), such as toast. Gradually return to your regular diet.  Drink enough fluid to keep your urine pale yellow.  Return to your normal activities as told by your health care provider. Ask your health care provider what activities are safe for you. This information is not intended to replace advice given to you by your health care provider. Make sure you discuss any questions you have with your health care provider. Document Revised: 09/11/2019 Document Reviewed: 04/10/2019 Elsevier Patient Education  2021 [employer].

## 2020-02-19 NOTE — Progress Notes (Signed)
Electrical Cardioversion Procedure Note Erin Schmidt [id number] [birth year]  Procedure: Electrical Cardioversion Indications:  Atrial FibrillationProcedure Details Consent: Direct current cardioversion Time Out: Verified patient identification, verified procedure, site/side was marked, verified correct patient position, special equipment/implants available, medications/allergies/relevent history reviewed, required imaging and test results available.  0924 Patient placed on cardiac monitor, pulse oximetry, supplemental oxygen as necessary.  Sedation given:per CRNA Pacer pads placed anterior/posterior Cardioverted 1 time(s).  Cardioverted at 200JEvaluation Findings: Post procedure EKG shows:SB Complications: None Patient did tolerate procedure well.   Jon Gills Casimir Barcellos 02/19/2020, 10:04 AM

## 2020-02-19 NOTE — CV Procedure (Signed)
CV Procedure Note  Procedure: Electrical cardioversion Indication: Persistent afib Physician: Dr Carlyle Dolly MD  Patient was brought to the procedure suite after appropriate consent was obtained. Sedation was achieved with the assistance of anesthesiology, please refer to there notes for details. Defib pads placed in the anterior and posterior positions, she was succesfully converted to sinus rhythm with a single synchronized 200j shock. Cardiopulmonary monitoring performed throughout the procedure, she tolerated well without complications.   Carlyle Dolly MD

## 2020-02-19 NOTE — Addendum Note (Signed)
Addendum  created 02/19/20 1118 by Ollen Bowl, CRNA   Charge Capture section accepted

## 2020-02-20 ENCOUNTER — Encounter (HOSPITAL_COMMUNITY): Payer: Self-pay | Admitting: Cardiology

## 2020-02-25 ENCOUNTER — Telehealth: Payer: Self-pay | Admitting: Cardiology

## 2020-02-25 NOTE — Telephone Encounter (Signed)
Talked with patient during her husbands visit in clinic today. Worsening edema, some new onset orthopnea. We had lowered her toresmide last week after uptrend in Cr to 40mg  daily. With worsening edema and symptoms increase to 40mg  bid, will have to accept a higher Cr for her in order to diurese. She has a nursing visit Friday, will f/u ekg, weights, and labs at that time   Carlyle Dolly MD

## 2020-02-25 NOTE — Telephone Encounter (Signed)
Re-faxed patient's Orencia consent documents with correct signed date - date was written as 2023 rather than 2022.

## 2020-02-26 ENCOUNTER — Other Ambulatory Visit: Payer: Self-pay | Admitting: *Deleted

## 2020-02-26 NOTE — Patient Outreach (Signed)
[facility]) Care Management  02/26/2020  Erin Schmidt 1932/11/02 [id number]   Outgoing call placed to member.  State she has had a rough several days.  Cardioversion was completed on 2/10, no issues at the time but started to develop swelling and shortness of breath.  She wasn't able to sleep in her bed the full night, had to sleep upright in recliner.  Her Torsemide was initially decreased, has now been increased again yesterday.  Since dose increase, she has dropped 4 pounds overnight (was up to 153 pounds on Monday, 146.8 today).  Follow up visit with nurse at cardiology office scheduled for tomorrow, will see NP on 3/2.  Denies any urgent concerns, encouraged to contact this care manager with questions.  Agrees to follow up within the next month.  Goals Addressed            This Visit's Progress   . [facility] - Track and Manage Activity and Exertion-Heart Failure   On track    Timeframe:  Long-Range Goal Priority:  Medium Start Date:     2/17                       Expected End Date:       04/25/2020             - make an activity or exercise plan - track exercise in diary for two weeks - track symptoms during activity in diary    Why is this important?    Exercising is very important when managing your heart failure.   It will help your heart get stronger.    Notes:     . [facility] - Track and Manage Fluids and Swelling-Heart Failure   On track    Timeframe:  Short-Term Goal Priority:  High Start Date:     02/26/2020                        Expected End Date:       03/25/2020                   - call office if I gain more than 2 pounds in one day or 5 pounds in one week - track weight in diary - watch for swelling in feet, ankles and legs every day - weigh myself daily    Why is this important?    It is important to check your weight daily and watch how much salt and liquids you have.   It will help you to manage your heart failure.    Notes:     .  [facility] - Track and Manage Heart Rate and Rhythm-Atrial Fibrillation   On track    Timeframe:  Short-Term Goal Priority:  Medium Start Date:        02/26/2020                   Expected End Date:    03/25/2020                     - check pulse (heart) rate once a day - make a plan to eat healthy - keep all lab appointments - take medicine as prescribed    Why is this important?    Atrial fibrillation may have no symptoms. Sometimes the symptoms get worse or happen more often.   It is important to keep track of what your symptoms  are and when they happen.   A change in symptoms is important to discuss with your doctor or nurse.   Being active and healthy eating will also help you manage your heart condition.     Notes:       Valente David, RN, MSN [place] [phone]

## 2020-02-27 ENCOUNTER — Other Ambulatory Visit: Payer: Self-pay

## 2020-02-27 ENCOUNTER — Ambulatory Visit (INDEPENDENT_AMBULATORY_CARE_PROVIDER_SITE_OTHER): Payer: Medicare Other

## 2020-02-27 ENCOUNTER — Other Ambulatory Visit (HOSPITAL_COMMUNITY)
Admission: RE | Admit: 2020-02-27 | Discharge: 2020-02-27 | Disposition: A | Discharge status: Home / Self Care | Payer: Medicare Other | Source: Ambulatory Visit | Attending: Cardiology | Admitting: Cardiology

## 2020-02-27 VITALS — BP 114/56 | HR 88 | Ht 66.5 in | Wt 143.2 lb

## 2020-02-27 DIAGNOSIS — Z79899 Other long term (current) drug therapy: Secondary | ICD-10-CM | POA: Diagnosis not present

## 2020-02-27 DIAGNOSIS — I4891 Unspecified atrial fibrillation: Secondary | ICD-10-CM

## 2020-02-27 LAB — BASIC METABOLIC PANEL
Anion gap: 9 (ref 5–15)
BUN: 19 mg/dL (ref 8–23)
CO2: 27 mmol/L (ref 22–32)
Calcium: 8.7 mg/dL — ABNORMAL LOW (ref 8.9–10.3)
Chloride: 101 mmol/L (ref 98–111)
Creatinine, Ser: 1.25 mg/dL — ABNORMAL HIGH (ref 0.44–1.00)
GFR, Estimated: 42 mL/min — ABNORMAL LOW (ref >60)
Glucose, Bld: 103 mg/dL — ABNORMAL HIGH (ref 70–99)
Potassium: 3.2 mmol/L — ABNORMAL LOW (ref 3.5–5.1)
Sodium: 137 mmol/L (ref 135–145)

## 2020-02-27 LAB — MAGNESIUM: Magnesium: 2.3 mg/dL (ref 1.7–2.4)

## 2020-02-27 MED ORDER — POTASSIUM CHLORIDE CRYS ER 20 MEQ PO TBCR: 40.0000 meq | EXTENDED_RELEASE_TABLET | Freq: Two times a day (BID) | ORAL | 3 refills | Status: DC

## 2020-02-27 NOTE — Patient Instructions (Addendum)
Medication Instructions:    INCREASE Potasium to 40 meq twice a day.   REPEAT lab work (BMET) next Friday, 03/05/20   Dr.Branch will review EKG and make recommendations.

## 2020-02-27 NOTE — Progress Notes (Signed)
Patient is here for a nurse visit for f/u bp check and ekg.

## 2020-03-01 ENCOUNTER — Encounter: Payer: Self-pay | Admitting: Cardiology

## 2020-03-01 ENCOUNTER — Telehealth: Payer: Self-pay

## 2020-03-01 DIAGNOSIS — I4819 Other persistent atrial fibrillation: Secondary | ICD-10-CM

## 2020-03-01 DIAGNOSIS — Z79899 Other long term (current) drug therapy: Secondary | ICD-10-CM

## 2020-03-01 NOTE — Telephone Encounter (Signed)
Lab orders placed for BMET/Mg Referral to EP placed  Confirmed patient is taking Torsemide 40 mg BID and Klor-Con 40 mEq BID.   Patient's updated weights: 2/18- 143.2 2/19- 142.6 2/20- 140.8 2/21- 141  Patient states that she still has some swelling in her legs, but that she can see her knees now, where previously she was unable to. She states that she wears compression stockings to help with the swelling as well. Patient feels satisfied with the results so far.

## 2020-03-01 NOTE — Telephone Encounter (Signed)
-----   Message from Arnoldo Lenis, MD sent at 03/01/2020  8:18 AM EST ----- I believe patient had a chance to talk to [facility] on Friday at her nursing visit. Her labs show kidney function is tolerating the higher torsemide dosing so far. Would continue the torsemide 40mg  bid for now, can she update [place] on her swellign and weights today. Potassium was a little low, I believe Brittney gave her instructions for increased dosing. EKG shows back in afib however rates look good. I would continue current regimen and refer her to EP please. Would repeat BMET/Mg on Thursday  Carlyle Dolly MD

## 2020-03-02 ENCOUNTER — Telehealth: Payer: Self-pay

## 2020-03-02 NOTE — Telephone Encounter (Addendum)
Received a fax from  [place] regarding an approval for [facility] patient assistance from 03/01/20 to 01/08/21  Phone number: [phone]  Standing orders for CBC and CMP still in place to be drawn.   TB gold negative 11/06/19. Next due 11/05/20.  Hep C antibody, Hep B core antibody, and Hep B surface antigen last 05/25/2015  IgG, IgA, IgM  05/25/2015  HIV antibody non-reactive on 01/19/2016  Serum protein electrophoresis w/ reflex 05/06/20  To be drawn prior to starting Orencia: - IgG, IgA, IgM - Hep C antibody, Hep B core antibody, and Hep B surface antigen - Magnesium (on behalf of cardiologist - will CC) - CBC with diff/plt - CMP with GFR  Patient verbalized understanding. She will plan to stop by on Thursday, 03/04/20 @ 2pm to have labs drawn.  Knox Saliva, PharmD, MPH Clinical Pharmacist (Rheumatology and Pulmonology)

## 2020-03-02 NOTE — Telephone Encounter (Signed)
Returned patient's call. Advised that we'd like updated labs drawn prior to starting Orencia. Will also draw magnesium with results to be sent to Dr. Nelly Laurence office to prevent additional draw.  Knox Saliva, PharmD, MPH Clinical Pharmacist (Rheumatology and Pulmonology)

## 2020-03-02 NOTE — Telephone Encounter (Signed)
Patient called checking the status of her prescription of Orencia and if the medication will be delivered to the office for her first injection like the Enbrel did.  Patient requested a return call.

## 2020-03-04 ENCOUNTER — Other Ambulatory Visit: Payer: Self-pay

## 2020-03-04 ENCOUNTER — Other Ambulatory Visit (HOSPITAL_COMMUNITY)
Admission: RE | Admit: 2020-03-04 | Discharge: 2020-03-04 | Disposition: A | Discharge status: Home / Self Care | Payer: Medicare Other | Source: Ambulatory Visit | Attending: Physician Assistant | Admitting: Physician Assistant

## 2020-03-04 DIAGNOSIS — Z79899 Other long term (current) drug therapy: Secondary | ICD-10-CM | POA: Insufficient documentation

## 2020-03-04 LAB — HEPATITIS B CORE ANTIBODY, IGM: Hep B C IgM: NONREACTIVE

## 2020-03-04 LAB — HEPATITIS C ANTIBODY: HCV Ab: NONREACTIVE

## 2020-03-04 LAB — MAGNESIUM: Magnesium: 2.3 mg/dL (ref 1.7–2.4)

## 2020-03-04 LAB — HEPATITIS B SURFACE ANTIGEN: Hepatitis B Surface Ag: NONREACTIVE

## 2020-03-05 ENCOUNTER — Telehealth: Payer: Self-pay | Admitting: *Deleted

## 2020-03-05 LAB — IGG, IGA, IGM
IgA: 188 mg/dL (ref 64–422)
IgG (Immunoglobin G), Serum: 825 mg/dL (ref 586–1602)
IgM (Immunoglobulin M), Srm: 96 mg/dL (ref 26–217)

## 2020-03-05 NOTE — Progress Notes (Signed)
Magnesium WNL.  Hepatitis B and C negative.

## 2020-03-05 NOTE — Telephone Encounter (Signed)
Patient scheduled for Orencia new start on Monday, 03/08/20 @ 10:30am

## 2020-03-05 NOTE — Telephone Encounter (Signed)
Labs have resulted. Please contact patient to discuss Orencia. Thanks!

## 2020-03-05 NOTE — Telephone Encounter (Signed)
-----   Message from Ofilia Neas, PA-C sent at 03/05/2020  8:39 AM EST ----- Immunoglobulins WNL

## 2020-03-05 NOTE — Progress Notes (Signed)
Immunoglobulins WNL

## 2020-03-08 ENCOUNTER — Ambulatory Visit: Payer: Medicare Other | Admitting: Pharmacist

## 2020-03-08 ENCOUNTER — Other Ambulatory Visit: Payer: Self-pay

## 2020-03-08 VITALS — BP 109/64 | HR 106

## 2020-03-08 DIAGNOSIS — M0609 Rheumatoid arthritis without rheumatoid factor, multiple sites: Secondary | ICD-10-CM

## 2020-03-08 MED ORDER — ORENCIA CLICKJECT 125 MG/ML ~~LOC~~ SOAJ: 125.0000 mg | SUBCUTANEOUS | 2 refills | Status: AC

## 2020-03-08 NOTE — Patient Instructions (Addendum)
Your Orencia dose will be 125 mg (1 pen) every 7 days. Your next doses are due: 3/7, 3/14, 3/21, 3/28, and every 7 days thereafter.  All shipments will come from [employer] ([employer] Their phone number is: [phone]  Remember the 5 C's:  COUNTER - leave on the counter at least 30 minutes but up to overnight to bring medication to room temperature. This may help prevent stinging  COLD - place something cold (like an ice gel pack or cold water bottle) on the injection site just before cleansing with alcohol. This may help reduce pain  CLARITIN - use Claritin (generic name is loratadine) for the first two weeks of treatment or the day of, the day before, and the day after injecting. This will help to minimize injection site reactions  CORTISONE CREAM - apply if injection site is irritated and itching  CALL ME - if injection site reaction is bigger than the size of your fist, looks infected, blisters, or if you develop hives  Standing Labs Please have your standing labs drawn in 1 month then every 3 months thereafter. We can coordinate labs to be at [facility] if that is most convenient for you at those time points. Please call us so we can enter/send orders in a timely manner!  If possible, please have your labs drawn 2 weeks prior to your appointment so that the provider can discuss your results at your appointment.  We have open lab daily Monday through Thursday from 1:30-4:30 PM and Friday from 1:30-4:00 PM at the office of Dr. Bo Merino, [facility].   Please be advised, all patients with office appointments requiring lab work will take precedents over walk-in lab work.  If possible, please come for your lab work on Monday and Friday afternoons, as you may experience shorter wait times. The office is located at [address] [place], [place], [place] [address] No appointment is necessary.   Labs are drawn by [facility]. Please bring your co-pay at the time  of your lab draw.  You may receive a bill from [place] for your lab work.  If you wish to have your labs drawn at another location, please call the office 24 hours in advance to send orders.  If you have any questions regarding directions or hours of operation,  please call [phone].   As a reminder, please drink plenty of water prior to coming for your lab work. Thanks!

## 2020-03-08 NOTE — Progress Notes (Signed)
Pharmacy Note  Subjective:   Patient presents to clinic today to receive first dose of Orencia for RA. Patient previously taking Enbrel and hydroxychloroquine.  Patient was instructed to stop both d/t recent diagnosis of heart failure as well as EKG showing prolonged QTc. Patient was established and clinically responsive to Enbrel and hydroxychloroquine. Recent diagnosis of CHF (boxed warning for CHF exacerbation with TNF inhibitors).  Patient running a fever or have signs/symptoms of infection? No  Patient currently on antibiotics for the treatment of infection? No  Patient have any upcoming invasive procedures/surgeries? No  Objective: CMP     Component Value Date/Time   NA 137 02/27/2020 1451   K 3.2 (L) 02/27/2020 1451   CL 101 02/27/2020 1451   CO2 27 02/27/2020 1451   GLUCOSE 103 (H) 02/27/2020 1451   BUN 19 02/27/2020 1451   CREATININE 1.25 (H) 02/27/2020 1451   CREATININE 0.97 (H) 11/06/2019 1518   CALCIUM 8.7 (L) 02/27/2020 1451   PROT 6.1 (L) 02/02/2020 1057   ALBUMIN 3.7 02/02/2020 1057   AST 25 02/02/2020 1057   ALT 20 02/02/2020 1057   ALKPHOS 67 02/02/2020 1057   BILITOT 0.7 02/02/2020 1057   GFRNONAA 42 (L) 02/27/2020 1451   GFRNONAA 53 (L) 11/06/2019 1518   GFRAA 62 11/06/2019 1518    CBC    Component Value Date/Time   WBC 6.3 02/02/2020 1057   RBC 3.63 (L) 02/02/2020 1057   HGB 10.5 (L) 02/02/2020 1057   HCT 34.8 (L) 02/02/2020 1057   PLT 240 02/02/2020 1057   MCV 95.9 02/02/2020 1057   MCH 28.9 02/02/2020 1057   MCHC 30.2 02/02/2020 1057   RDW 27.8 (H) 02/02/2020 1057   LYMPHSABS 1.2 02/02/2020 1057   MONOABS 0.7 02/02/2020 1057   EOSABS 0.2 02/02/2020 1057   BASOSABS 0.0 02/02/2020 1057    Baseline Immunosuppressant Therapy Labs TB GOLD Quantiferon TB Gold Latest Ref Rng & Units 11/06/2019  Quantiferon TB Gold Plus NEGATIVE NEGATIVE   Hepatitis Panel Hepatitis Latest Ref Rng & Units 03/04/2020  Hep B Surface Ag NON REACTIVE NON REACTIVE   Hep B IgM NON REACTIVE NON REACTIVE  Hep C Ab NON REACTIVE NON REACTIVE   HIV Lab Results  Component Value Date   HIV NONREACTIVE 01/19/2016   Immunoglobulins Immunoglobulin Electrophoresis Latest Ref Rng & Units 03/04/2020  IgG 586 - 1,602 mg/dL 825  IgM 26 - 217 mg/dL 96   SPEP Serum Protein Electrophoresis Latest Ref Rng & Units 02/02/2020  Total Protein 6.5 - 8.1 g/dL 6.1(L)  Albumin 2.9 - 4.4 g/dL -  Alpha-1 0.0 - 0.4 g/dL -  Alpha-2 0.4 - 1.0 g/dL -  Beta Globulin 0.7 - 1.3 g/dL -  Gamma Globulin 0.4 - 1.8 g/dL -  Interpretation - -   Chest x-ray: 12/01/19 "Mild cardiac enlargement. Interstitial pattern to the lungs may represent interstitial edema. Early multifocal interstitial pneumonia could also have this appearance. No pleural effusions. No pneumothorax. Mediastinal contours appear intact. Calcification of the aorta."  Immunization History  Administered Date(s) Administered  . Moderna Sars-Covid-2 Vaccination 02/04/2019, 03/04/2019, 11/12/2019  . Zoster Recombinat (Shingrix) 02/08/2017, 04/12/2017     Assessment/Plan:  Counseled patient that Maureen Chatters is a selective T-cell costimulation blocker.  Counseled patient on purpose, proper use, and adverse effects of Orencia. The most common adverse effects are increased risk of infections, headache, and injection site reactions or infusion reactions. Reviewed risk of GI perforation which is higher in patients with diverticulitis and diabetes. There is  the possibility of an increased risk of malignancy but it is not well understood if this increased risk is due to the medication or the disease state.  Counseled patient that Maureen Chatters should be held prior to scheduled surgery.  Counseled patient to avoid live vaccines while on Orencia.  Recommend annual influenza, Pneumovax 23, Prevnar 13, and Shingrix as indicated.  Reviewed storage information for Orencia.  She receives yearly dermatology exams - reviewed that she should continue to  do so.  Patient has received 3 [place] vaccines. Patient is UTD on Shingles vaccine.  Provided patient with medication education material and answered all questions.  Patient consented to [facility].  Will upload consent into patient's chart.  Patient's dose will be Orencia 125mg  SQ every 7 days. Patient has stopped hydroxychloroquine and Enbrel. Discarded Enbrel pens today.  Demonstrated proper injection technique with Orencia Clickject demo device  Patient able to demonstrate proper injection technique using the teach back method.  Patient self injected in the right thigh with:  Sample Medication: Orencia Clickject 125mg /mL NDC: [address]-92 Lot: [id number] Expiration: 04/2021  Patient tolerated well.  Observed for 30 mins in office for adverse reaction.  Patient is to return in 1 month for labs and 8 weeks for follow-up appointment. Standing orders for CBC with diff/platelet and CMP with GFR remain in place.   Orencia Clickject Autoinjector approved through [employer] Patient Assistance Program.  Patient provided with phone number for patient assistance to schedule shipment. She has already received 84- day shipment in mail and is stored in refrigerator as instructed.   All questions encouraged and answered.  Instructed patient to call with any further questions or concerns.  Knox Saliva, PharmD, MPH Clinical Pharmacist (Rheumatology and Pulmonology)  03/08/2020 8:00 AM

## 2020-03-09 ENCOUNTER — Other Ambulatory Visit: Payer: Self-pay

## 2020-03-09 ENCOUNTER — Encounter: Payer: Self-pay | Admitting: Internal Medicine

## 2020-03-09 ENCOUNTER — Ambulatory Visit: Payer: Medicare Other | Admitting: Internal Medicine

## 2020-03-09 VITALS — BP 114/72 | HR 96 | Ht 66.5 in | Wt 145.4 lb

## 2020-03-09 DIAGNOSIS — I5033 Acute on chronic diastolic (congestive) heart failure: Secondary | ICD-10-CM

## 2020-03-09 DIAGNOSIS — I4819 Other persistent atrial fibrillation: Secondary | ICD-10-CM

## 2020-03-09 NOTE — Patient Instructions (Addendum)
Medication Instructions:  STOP Amiodarone   *If you need a refill on your cardiac medications before your next appointment, please call your pharmacy*   Lab Work: None today If you have labs (blood work) drawn today and your tests are completely normal, you will receive your results only by: Marland Kitchen MyChart Message (if you have MyChart) OR . A paper copy in the mail If you have any lab test that is abnormal or we need to change your treatment, we will call you to review the results.   Testing/Procedures: In 3 months: 3 day ZIO monitor for a-fib     Follow-Up: At [facility], you and your health needs are our priority.  As part of our continuing mission to provide you with exceptional heart care, we have created designated Provider Care Teams.  These Care Teams include your primary Cardiologist (physician) and Advanced Practice Providers (APPs -  Physician Assistants and Nurse Practitioners) who all work together to provide you with the care you need, when you need it.  We recommend signing up for the patient portal called "MyChart".  Sign up information is provided on this After Visit Summary.  MyChart is used to connect with patients for Virtual Visits (Telemedicine).  Patients are able to view lab/test results, encounter notes, upcoming appointments, etc.  Non-urgent messages can be sent to your provider as well.   To learn more about what you can do with MyChart, go to [email or web address].    Your next appointment:   4 month(s)  The format for your next appointment:   In Person  Provider:   Cristopher Peru, MD   Other Instructions None      Thank you for choosing [facility] !

## 2020-03-09 NOTE — Progress Notes (Signed)
HPI Erin Schmidt is referred by Dr.  Harl Bowie for evaluation of atrial fib. She is a pleasant 87 yo woman with RA, and thyroid dysfunction who was found to be in atrial fib in September. She was asymptomatic. She has undergone DCCV twice and been on amiodarone. She has not maintained NSR. She denies chest pain or sob. She has had worsening peripheral edema which has been treated by Dr. Harl Bowie with torsemide. She does not feel palpitations and denies worsening sob in atrial fib.  Allergies  Allergen Reactions  . Feraheme [Ferumoxytol]     Reacted during infusion requiring medical intervention. Increased BP     Current Outpatient Medications  Medication Sig Dispense Refill  . Abatacept (ORENCIA CLICKJECT) 161 MG/ML SOAJ Inject 125 mg into the skin every 7 (seven) days. 3.92 mL 2  . amiodarone (PACERONE) 200 MG tablet Take 1 tablet (200 mg total) by mouth daily. 90 tablet 3  . Calcium Carb-Cholecalciferol (CALCIUM 600 + D PO) Take 1 tablet by mouth 2 (two) times daily.    . cetirizine (ZYRTEC) 10 MG tablet Take 10 mg by mouth daily.    . cholecalciferol (VITAMIN D3) 25 MCG (1000 UNIT) tablet Take 1,000 Units by mouth daily.    Marland Kitchen diltiazem (CARDIZEM CD) 240 MG 24 hr capsule Take 1 capsule (240 mg total) by mouth daily. 30 capsule 3  . ELIQUIS 5 MG TABS tablet TAKE 1 TABLET BY MOUTH TWICE A DAY (Patient taking differently: Take 5 mg by mouth 2 (two) times daily.) 60 tablet 3  . escitalopram (LEXAPRO) 10 MG tablet Take 10 mg by mouth daily.    Marland Kitchen ketotifen (ZADITOR) 0.025 % ophthalmic solution Place 1 drop into both eyes 2 (two) times daily as needed (allergies).    Marland Kitchen levothyroxine (SYNTHROID, LEVOTHROID) 112 MCG tablet Take 112 mcg by mouth daily before breakfast.     . meclizine (ANTIVERT) 25 MG tablet Take 25 mg by mouth 3 (three) times daily as needed for dizziness.    . Melatonin 10 MG CAPS Take 10 mg by mouth at bedtime.    Marland Kitchen omeprazole (PRILOSEC) 20 MG capsule Take 20 mg by mouth every  Monday, Wednesday, and Friday.    . potassium chloride SA (KLOR-CON M20) 20 MEQ tablet Take 2 tablets (40 mEq total) by mouth 2 (two) times daily. 120 tablet 3  . torsemide (DEMADEX) 20 MG tablet Take 2 tablets (40 mg total) by mouth daily. 180 tablet 3  . traMADol-acetaminophen (ULTRACET) 37.5-325 MG tablet Take 1 tablet by mouth in the morning and at bedtime.      No current facility-administered medications for this visit.     Past Medical History:  Diagnosis Date  . Arthritis   . Atrial fibrillation ([place])   . Dysrhythmia   . GERD (gastroesophageal reflux disease)   . Hyperlipemia   . Hypertension    preventative measures  . Hypothyroidism   . Iron deficiency anemia 05/07/2019  . Migraine   . Thyroid disease   . Vertigo     ROS:   All systems reviewed and negative except as noted in the HPI.   Past Surgical History:  Procedure Laterality Date  . APPENDECTOMY    . BREAST BIOPSY Right    benign  . BREAST EXCISIONAL BIOPSY Left    Benign  . BREAST EXCISIONAL BIOPSY Left    Benign  . BREAST LUMPECTOMY  30 yrs ago   Dr. Milbert Coulter- rt breast  . BREAST LUMPECTOMY  15 yrs ago   left- Dr. Romona Curls  . BREAST LUMPECTOMY  01/2011  . CARDIOVERSION N/A 11/10/2019   Procedure: CARDIOVERSION;  Surgeon: Arnoldo Lenis, MD;  Location: [facility];  Service: Endoscopy;  Laterality: N/A;  . CARDIOVERSION N/A 02/19/2020   Procedure: CARDIOVERSION;  Surgeon: Arnoldo Lenis, MD;  Location: [facility];  Service: Endoscopy;  Laterality: N/A;  . CHOLECYSTECTOMY    . COLONOSCOPY    . COLONOSCOPY  04/13/2011   Procedure: COLONOSCOPY;  Surgeon: Rogene Houston, MD;  Location: [facility] SU[facility];  Service: Endoscopy;  Laterality: N/A;  930  . MASTECTOMY, PARTIAL  01/13/2011   Procedure: MASTECTOMY PARTIAL;  Surgeon: Adin Hector, MD;  Location: [facility];  Service: General;  Laterality: Left;  left partial mastectomy with needle locallization  . VAGINAL HYSTERECTOMY     2 partials      Family History  Problem Relation Age of Onset  . Heart disease Mother   . Stroke Sister   . Heart disease Brother   . Atrial fibrillation Sister   . Coronary artery disease Brother   . Lung cancer Son   . Healthy Son   . Colon cancer Neg Hx      Social History   Socioeconomic History  . Marital status: Married    Spouse name: Not on file  . Number of children: 2  . Years of education: Not on file  . Highest education level: Not on file  Occupational History  . Occupation: RETIRED  Tobacco Use  . Smoking status: Former Smoker    Packs/day: 1.00    Years: 20.00    Pack years: 20.00    Types: Cigarettes    Quit date: 01/05/1989    Years since quitting: 31.1  . Smokeless tobacco: Never Used  Vaping Use  . Vaping Use: Never used  Substance and Sexual Activity  . Alcohol use: No  . Drug use: Never  . Sexual activity: Not on file  Other Topics Concern  . Not on file  Social History Narrative  . Not on file   Social Determinants of Health   Financial Resource Strain: Not on file  Food Insecurity: No Food Insecurity  . Worried About Charity fundraiser in the Last Year: Never true  . Ran Out of Food in the Last Year: Never true  Transportation Needs: No Transportation Needs  . Lack of Transportation (Medical): No  . Lack of Transportation (Non-Medical): No  Physical Activity: Not on file  Stress: Not on file  Social Connections: Not on file  Intimate Partner Violence: Not on file     BP 114/72   Pulse 96   Ht 5' 6.5" (1.689 m)   Wt 145 lb 6.4 oz (66 kg)   SpO2 98%   BMI 23.12 kg/m   Physical Exam:  Well appearing NAD HEENT: Unremarkable Neck:  No JVD, no thyromegally Lymphatics:  No adenopathy Back:  No CVA tenderness Lungs:  Clear with no wheezes HEART:  IRegular rate rhythm, no murmurs, no rubs, no clicks Abd:  soft, positive bowel sounds, no organomegally, no rebound, no guarding Ext:  2 plus pulses, no edema, no cyanosis, no  clubbing Skin:  No rashes no nodules Neuro:  CN II through XII intact, motor grossly intact   Assess/Plan: 1. Persistent atrial fib - I have discussed the treatment options with the patient. Because she is not symptomatic, I think that amiodarone is relatively contra-indicated and appears to be ineffective.  Her rates appear to be controlled. Only question at this point is whether she will develop RVR off of amio and just on cardizem. I have recommended we stop the amio and have her wear a 3 d Zio monitor in 3 months. Additional recs will be made after with regard to rate control. 2. Coags - she appears to be tolerating the eliquis without bleeding and will continue this medication.  3. Chronic diastolic heart failure -she still has some peirpheral edema but this is improved on torsemide.  Erin Overlie Rendon Howell,MD

## 2020-03-10 ENCOUNTER — Ambulatory Visit: Payer: Medicare Other | Admitting: Cardiology

## 2020-03-15 ENCOUNTER — Ambulatory Visit (HOSPITAL_COMMUNITY)
Admission: RE | Admit: 2020-03-15 | Discharge: 2020-03-15 | Disposition: A | Discharge status: Home / Self Care | Payer: Medicare Other | Source: Ambulatory Visit | Attending: Family Medicine | Admitting: Family Medicine

## 2020-03-15 DIAGNOSIS — Z1231 Encounter for screening mammogram for malignant neoplasm of breast: Secondary | ICD-10-CM | POA: Insufficient documentation

## 2020-03-15 NOTE — Telephone Encounter (Signed)
Opened in error

## 2020-03-17 DIAGNOSIS — D649 Anemia, unspecified: Secondary | ICD-10-CM | POA: Diagnosis not present

## 2020-03-17 DIAGNOSIS — I10 Essential (primary) hypertension: Secondary | ICD-10-CM | POA: Diagnosis not present

## 2020-03-17 DIAGNOSIS — M069 Rheumatoid arthritis, unspecified: Secondary | ICD-10-CM | POA: Diagnosis not present

## 2020-03-17 DIAGNOSIS — E039 Hypothyroidism, unspecified: Secondary | ICD-10-CM | POA: Diagnosis not present

## 2020-03-17 DIAGNOSIS — Z6824 Body mass index (BMI) 24.0-24.9, adult: Secondary | ICD-10-CM | POA: Diagnosis not present

## 2020-03-17 DIAGNOSIS — Z0001 Encounter for general adult medical examination with abnormal findings: Secondary | ICD-10-CM | POA: Diagnosis not present

## 2020-03-17 DIAGNOSIS — R3129 Other microscopic hematuria: Secondary | ICD-10-CM | POA: Diagnosis not present

## 2020-03-17 DIAGNOSIS — I4891 Unspecified atrial fibrillation: Secondary | ICD-10-CM | POA: Diagnosis not present

## 2020-03-17 DIAGNOSIS — I509 Heart failure, unspecified: Secondary | ICD-10-CM | POA: Diagnosis not present

## 2020-03-25 ENCOUNTER — Telehealth: Payer: Self-pay | Admitting: Internal Medicine

## 2020-03-25 ENCOUNTER — Other Ambulatory Visit: Payer: Self-pay | Admitting: *Deleted

## 2020-03-25 NOTE — Telephone Encounter (Signed)
Returned patient call with no answer. Patient does not have a voice mail set up.

## 2020-03-25 NOTE — Patient Outreach (Signed)
[facility]) Care Management  [place]  03/25/2020   Erin Schmidt 07/14/1932 [id number]   Outgoing call placed to member, state she has still been having trouble with fluid management and intermittent shortness of breath.  She as been seen by cardiology, medications altered.  Torsemide now is 40 mg twice a day, Potassium increased, and Amiodarone stopped.  Her weight over the past few days have been 145 pounds, 142.2, and today 146 pounds, increasing 4 pounds over night.  She has not contacted the MD but will do so.  Since being off Amiodarone, heart rate has ranged from 50's-110's, but report its mostly in the 80's. Denies any chest pain/discomfort.  She continues to monitor her diet, decreasing salt/fluids, eating more fruits and vegetables.  Able to still perform daily living activities without problem.  Denies any urgent concerns, encouraged to contact this care manager with questions.   Encounter Medications:  Outpatient Encounter Medications as of 03/25/2020  Medication Sig  . Abatacept (ORENCIA CLICKJECT) 810 MG/ML SOAJ Inject 125 mg into the skin every 7 (seven) days.  . Calcium Carb-Cholecalciferol (CALCIUM 600 + D PO) Take 1 tablet by mouth 2 (two) times daily.  . cetirizine (ZYRTEC) 10 MG tablet Take 10 mg by mouth daily.  . cholecalciferol (VITAMIN D3) 25 MCG (1000 UNIT) tablet Take 1,000 Units by mouth daily.  Marland Kitchen diltiazem (CARDIZEM CD) 240 MG 24 hr capsule Take 1 capsule (240 mg total) by mouth daily.  Marland Kitchen ELIQUIS 5 MG TABS tablet TAKE 1 TABLET BY MOUTH TWICE A DAY (Patient taking differently: Take 5 mg by mouth 2 (two) times daily.)  . escitalopram (LEXAPRO) 10 MG tablet Take 10 mg by mouth daily.  Marland Kitchen ketotifen (ZADITOR) 0.025 % ophthalmic solution Place 1 drop into both eyes 2 (two) times daily as needed (allergies).  Marland Kitchen levothyroxine (SYNTHROID, LEVOTHROID) 112 MCG tablet Take 112 mcg by mouth daily before breakfast.   . meclizine (ANTIVERT) 25 MG  tablet Take 25 mg by mouth 3 (three) times daily as needed for dizziness.  . Melatonin 10 MG CAPS Take 10 mg by mouth at bedtime.  Marland Kitchen omeprazole (PRILOSEC) 20 MG capsule Take 20 mg by mouth every Monday, Wednesday, and Friday.  . potassium chloride SA (KLOR-CON M20) 20 MEQ tablet Take 2 tablets (40 mEq total) by mouth 2 (two) times daily.  Marland Kitchen torsemide (DEMADEX) 20 MG tablet Take 2 tablets (40 mg total) by mouth daily.  . traMADol-acetaminophen (ULTRACET) 37.5-325 MG tablet Take 1 tablet by mouth in the morning and at bedtime.    No facility-administered encounter medications on file as of 03/25/2020.    Functional Status:  In your present state of health, do you have any difficulty performing the following activities: 02/18/2020 12/01/2019  Hearing? - N  Vision? - N  Difficulty concentrating or making decisions? - N  Walking or climbing stairs? - N  Dressing or bathing? - N  Doing errands, shopping? N N  Some recent data might be hidden    Fall/Depression Screening: No flowsheet data found. PHQ 2/9 Scores 12/17/2019  PHQ - 2 Score 0    Assessment:  Goals Addressed            This Visit's Progress   . [facility] - Track and Manage Activity and Exertion-Heart Failure   On track    Timeframe:  Long-Range Goal Priority:  Medium Start Date:     2/17  Expected End Date:       04/25/2020             - make an activity or exercise plan - track exercise in diary for two weeks - track symptoms during activity in diary    Why is this important?    Exercising is very important when managing your heart failure.   It will help your heart get stronger.    Notes:   3/17 - Encouraged to increase activity as tolerated    . [facility] - Track and Manage Fluids and Swelling-Heart Failure   On track    Timeframe:  Short-Term Goal Priority:  High Start Date:     03/25/2020                        Expected End Date:       04/25/2020                   - call office if I gain  more than 2 pounds in one day or 5 pounds in one week - track weight in diary - watch for swelling in feet, ankles and legs every day - weigh myself daily    Why is this important?    It is important to check your weight daily and watch how much salt and liquids you have.   It will help you to manage your heart failure.    Notes:   3/17 - Reviewed weight changes over the past week (lowest 139.8, highest today 146)    . [facility] - Track and Manage Heart Rate and Rhythm-Atrial Fibrillation   On track    Timeframe:  Short-Term Goal Priority:  Medium Start Date:        02/26/2020                   Expected End Date:    03/25/2020                     - check pulse (heart) rate once a day - make a plan to eat healthy - keep all lab appointments - take medicine as prescribed    Why is this important?    Atrial fibrillation may have no symptoms. Sometimes the symptoms get worse or happen more often.   It is important to keep track of what your symptoms are and when they happen.   A change in symptoms is important to discuss with your doctor or nurse.   Being active and healthy eating will also help you manage your heart condition.     Notes:        Plan:  Follow-up:  Patient agrees to Care Plan and Follow-up.  Will follow up within the next month.  Valente David, [place], MSN [place] [phone]

## 2020-03-25 NOTE — Telephone Encounter (Signed)
    She can take Torsemide 60mg  BID for 2-3 days then resume 40mg  BID.  Would take an extra 20 mEq of K-dur on top of her regular dosing while on the higher dose of Torsemide.   Signed, Erma Heritage, PA-C 03/25/2020, 3:50 PM Pager: [phone]

## 2020-03-25 NOTE — Telephone Encounter (Signed)
Patient verbalized understanding and will take Torsemide 60 mg BID 2-3 days and extra K-dur 20 mEq while on higher dosing. She will call back if she has any problems.

## 2020-03-25 NOTE — Telephone Encounter (Signed)
New message   Pt c/o swelling: STAT is pt has developed SOB within 24 hours  1) How much weight have you gained and in what time span? 4lbs  2) If swelling, where is the swelling located? in chest area, stomach and legs  3) Are you currently taking a fluid pill?  yes  4) Are you currently SOB? Yes    5) Do you have a log of your daily weights (if so, list)? 3/12 146.4 3/13 145.0 3/15 144.6 3/16 142.2 3/17 146.2   6) Have you gained 3 pounds in a day or 5 pounds in a week? yes  7) Have you traveled recently? no

## 2020-03-25 NOTE — Telephone Encounter (Signed)
Contacted patient who is c/o holding fluid in her chest, legs, and stomach. She had SOB on 3/16 and still a little today (3/17) but it is not as bad as it was on 3/16. She denies any tightness in her chest, but does admit she had a dull pain in her chest area from below her breast to her stomach area and numbness in her feet. Patient is still taking torsemide 40 mg BID.   Please advise.

## 2020-03-31 ENCOUNTER — Telehealth: Payer: Self-pay | Admitting: Student

## 2020-03-31 DIAGNOSIS — Z79899 Other long term (current) drug therapy: Secondary | ICD-10-CM

## 2020-03-31 MED ORDER — TORSEMIDE 20 MG PO TABS: 60.0000 mg | ORAL_TABLET | Freq: Two times a day (BID) | ORAL | 11 refills | Status: DC

## 2020-03-31 NOTE — Telephone Encounter (Signed)
Patient called said she followed the advise from [place] to take torsemide (DEMADEX) 20 MG tablet and potassium chloride SA (KLOR-CON M20) 20 MEQ tablet  the 60mg  instead of 40mg .She went from 143lbs up to 146lbs. She started that Thurs.(3/17)PM,  Fri.(3/18)AM & PM, Sat.(3/19) both AM & PM, Sun.(3/20) AM only. She said that her weight is back up to 146.6, having trouble breathing, some mucus in her stools, and skin in her mouth coming off. She is unsure what she should do. Please call her back.

## 2020-03-31 NOTE — Telephone Encounter (Signed)
Pt reports weight as follows: Sunday 145.6lb, Monday 145lbs, Tuesday 143lb, Today 146.6lb. Pt agrees to increase Torsemide to 60 mg BID and have labs done on Tuesday of next week. Pt states the she does not want to be seen by her PCP or urgent care at this time. She states that she will be seen in the ER if sx become worse.

## 2020-03-31 NOTE — Telephone Encounter (Signed)
Called patient back who states that she is still swelling up in her legs/feet/ankles. She admits that the swelling in her legs decreases at night and she wears compression stockings, but returns the next day and her feet and ankles stay swollen. Patient c/o SOB but denies CP. Patient stated that she is sore from trying to breathe deeply. Patient has said that she has gained 3 lbs since last night. Patient is also c/o peeling skin inside her mouth and mucus in her stools with no cramping. Patient is not sure if this is related to taking the extra lasix over the weekend. Patient is back taking 40 mg lasix QD.  Please advise.

## 2020-03-31 NOTE — Telephone Encounter (Signed)
    It looks like she was recently started on Orencia for RA so I am going to tag the Clinical Pharmacist and Hazel Sams, PA-C in this to see if it is possible side-effect of the medication or if there is a higher risk of SJS/TEN with the medication. Still recommend evaluation by her PCP or Urgent Care.   If her weight has gone up, I would go back to the 60mg  BID for now. Repeat BMET within the next week. I am concerned about her elevated HR since stopping Amiodarone and will send a message to Dr. Lovena Le in regards to restarting it or not or if he wishes for her to wear a monitor sooner rather than later as mentioned in his last office note.   Signed, Erma Heritage, PA-C 03/31/2020, 4:44 PM Pager: [phone]

## 2020-03-31 NOTE — Telephone Encounter (Signed)
Hi [place],  I have not heard of this as a side effect of orencia.  Typically we see injection site reactions if the patient has a hypersensitivity to [place].  I also clarified with Dr. Estanislado Pandy and she has not seen this as a SE.  We both agree with your recommendation for the patient to be evaluated by her PCP or urgent care. Thank you for the update!

## 2020-03-31 NOTE — Telephone Encounter (Signed)
° ° °  I had previously recommended she take Torsemide 60mg  TWICE DAILY yet the notes mention once daily and also mentions Lasix in certain notes. Please verify if she is taking Lasix or Torsemide and the exact dose.  Has she been able to check her HR at home? I am concerned her rates might be elevated since stopping Amiodarone and this could be contributing to her symptoms.   The peeling skin inside her mouth is concerning as this can sometimes be a reaction to a medication. Has she changed any other medications recently? I would typically not think of a diuretic causing this. I would recommend she see her PCP for evaluation as soon as possible and if they do not have anything, perhaps go to Urgent Care.   Signed, Erma Heritage, PA-C 03/31/2020, 12:57 PM Pager: [phone]

## 2020-03-31 NOTE — Telephone Encounter (Signed)
Call pt to notify that Erin Schmidt and Dr. Estanislado Pandy were notified and recommend that she be seen by her PCP or urgent care. Pt agrees and states that she will call Dr. Hilma Favors in the morning.

## 2020-03-31 NOTE — Telephone Encounter (Signed)
Patient was adamant she was taking Torsemide 60 mg BID for 2-3 days then went back to 40 mg BID.  HR:  3/17- 102 bpm 3/18- 99 bpm 3/19- 104 bpm 3/20- 93 bpm 3/21- 107 bpm 3/22- 55 bpm   Advised patient it would be best to be seen in the Urgent Care dept due to the peeling in her mouth.

## 2020-04-02 ENCOUNTER — Encounter (HOSPITAL_COMMUNITY): Payer: Self-pay | Admitting: Internal Medicine

## 2020-04-02 ENCOUNTER — Emergency Department (HOSPITAL_COMMUNITY): Payer: Medicare Other

## 2020-04-02 ENCOUNTER — Inpatient Hospital Stay (HOSPITAL_COMMUNITY): Payer: Medicare Other

## 2020-04-02 ENCOUNTER — Inpatient Hospital Stay (HOSPITAL_COMMUNITY)
Admission: EM | Admit: 2020-04-02 | Discharge: 2020-04-09 | DRG: 291 | Disposition: A | Discharge status: Home / Self Care | Payer: Medicare Other | Attending: Family Medicine | Admitting: Family Medicine

## 2020-04-02 ENCOUNTER — Other Ambulatory Visit: Payer: Self-pay

## 2020-04-02 DIAGNOSIS — Z87891 Personal history of nicotine dependence: Secondary | ICD-10-CM

## 2020-04-02 DIAGNOSIS — Z90710 Acquired absence of both cervix and uterus: Secondary | ICD-10-CM

## 2020-04-02 DIAGNOSIS — Z9012 Acquired absence of left breast and nipple: Secondary | ICD-10-CM | POA: Diagnosis not present

## 2020-04-02 DIAGNOSIS — K219 Gastro-esophageal reflux disease without esophagitis: Secondary | ICD-10-CM | POA: Diagnosis present

## 2020-04-02 DIAGNOSIS — Z743 Need for continuous supervision: Secondary | ICD-10-CM | POA: Diagnosis not present

## 2020-04-02 DIAGNOSIS — Z20822 Contact with and (suspected) exposure to covid-19: Secondary | ICD-10-CM | POA: Diagnosis present

## 2020-04-02 DIAGNOSIS — D75839 Thrombocytosis, unspecified: Secondary | ICD-10-CM

## 2020-04-02 DIAGNOSIS — I4821 Permanent atrial fibrillation: Secondary | ICD-10-CM | POA: Diagnosis present

## 2020-04-02 DIAGNOSIS — M0609 Rheumatoid arthritis without rheumatoid factor, multiple sites: Secondary | ICD-10-CM | POA: Diagnosis present

## 2020-04-02 DIAGNOSIS — R0689 Other abnormalities of breathing: Secondary | ICD-10-CM

## 2020-04-02 DIAGNOSIS — D631 Anemia in chronic kidney disease: Secondary | ICD-10-CM | POA: Diagnosis not present

## 2020-04-02 DIAGNOSIS — N189 Chronic kidney disease, unspecified: Secondary | ICD-10-CM | POA: Diagnosis not present

## 2020-04-02 DIAGNOSIS — E785 Hyperlipidemia, unspecified: Secondary | ICD-10-CM | POA: Diagnosis present

## 2020-04-02 DIAGNOSIS — I499 Cardiac arrhythmia, unspecified: Secondary | ICD-10-CM | POA: Diagnosis not present

## 2020-04-02 DIAGNOSIS — R918 Other nonspecific abnormal finding of lung field: Secondary | ICD-10-CM | POA: Diagnosis not present

## 2020-04-02 DIAGNOSIS — I4819 Other persistent atrial fibrillation: Secondary | ICD-10-CM | POA: Diagnosis not present

## 2020-04-02 DIAGNOSIS — N1832 Chronic kidney disease, stage 3b: Secondary | ICD-10-CM | POA: Diagnosis not present

## 2020-04-02 DIAGNOSIS — J90 Pleural effusion, not elsewhere classified: Secondary | ICD-10-CM | POA: Diagnosis not present

## 2020-04-02 DIAGNOSIS — E875 Hyperkalemia: Secondary | ICD-10-CM

## 2020-04-02 DIAGNOSIS — R0989 Other specified symptoms and signs involving the circulatory and respiratory systems: Secondary | ICD-10-CM | POA: Diagnosis not present

## 2020-04-02 DIAGNOSIS — N1831 Chronic kidney disease, stage 3a: Secondary | ICD-10-CM | POA: Diagnosis not present

## 2020-04-02 DIAGNOSIS — I340 Nonrheumatic mitral (valve) insufficiency: Secondary | ICD-10-CM | POA: Diagnosis present

## 2020-04-02 DIAGNOSIS — I472 Ventricular tachycardia: Secondary | ICD-10-CM | POA: Diagnosis not present

## 2020-04-02 DIAGNOSIS — I5043 Acute on chronic combined systolic (congestive) and diastolic (congestive) heart failure: Secondary | ICD-10-CM | POA: Diagnosis present

## 2020-04-02 DIAGNOSIS — I10 Essential (primary) hypertension: Secondary | ICD-10-CM

## 2020-04-02 DIAGNOSIS — Z7901 Long term (current) use of anticoagulants: Secondary | ICD-10-CM | POA: Diagnosis not present

## 2020-04-02 DIAGNOSIS — I429 Cardiomyopathy, unspecified: Secondary | ICD-10-CM | POA: Diagnosis not present

## 2020-04-02 DIAGNOSIS — R778 Other specified abnormalities of plasma proteins: Secondary | ICD-10-CM | POA: Diagnosis not present

## 2020-04-02 DIAGNOSIS — D649 Anemia, unspecified: Secondary | ICD-10-CM

## 2020-04-02 DIAGNOSIS — R5381 Other malaise: Secondary | ICD-10-CM | POA: Diagnosis not present

## 2020-04-02 DIAGNOSIS — N170 Acute kidney failure with tubular necrosis: Secondary | ICD-10-CM | POA: Diagnosis not present

## 2020-04-02 DIAGNOSIS — I5041 Acute combined systolic (congestive) and diastolic (congestive) heart failure: Secondary | ICD-10-CM | POA: Diagnosis not present

## 2020-04-02 DIAGNOSIS — R001 Bradycardia, unspecified: Secondary | ICD-10-CM | POA: Diagnosis not present

## 2020-04-02 DIAGNOSIS — R0602 Shortness of breath: Secondary | ICD-10-CM | POA: Diagnosis not present

## 2020-04-02 DIAGNOSIS — R0600 Dyspnea, unspecified: Secondary | ICD-10-CM

## 2020-04-02 DIAGNOSIS — D509 Iron deficiency anemia, unspecified: Secondary | ICD-10-CM | POA: Diagnosis not present

## 2020-04-02 DIAGNOSIS — I130 Hypertensive heart and chronic kidney disease with heart failure and stage 1 through stage 4 chronic kidney disease, or unspecified chronic kidney disease: Secondary | ICD-10-CM | POA: Diagnosis not present

## 2020-04-02 DIAGNOSIS — I5031 Acute diastolic (congestive) heart failure: Secondary | ICD-10-CM | POA: Diagnosis not present

## 2020-04-02 DIAGNOSIS — R7989 Other specified abnormal findings of blood chemistry: Secondary | ICD-10-CM

## 2020-04-02 DIAGNOSIS — I509 Heart failure, unspecified: Secondary | ICD-10-CM | POA: Diagnosis not present

## 2020-04-02 DIAGNOSIS — I110 Hypertensive heart disease with heart failure: Secondary | ICD-10-CM | POA: Diagnosis not present

## 2020-04-02 DIAGNOSIS — R6889 Other general symptoms and signs: Secondary | ICD-10-CM | POA: Diagnosis not present

## 2020-04-02 DIAGNOSIS — Z79891 Long term (current) use of opiate analgesic: Secondary | ICD-10-CM

## 2020-04-02 DIAGNOSIS — R069 Unspecified abnormalities of breathing: Secondary | ICD-10-CM | POA: Diagnosis not present

## 2020-04-02 DIAGNOSIS — J948 Other specified pleural conditions: Secondary | ICD-10-CM | POA: Diagnosis not present

## 2020-04-02 DIAGNOSIS — M1991 Primary osteoarthritis, unspecified site: Secondary | ICD-10-CM | POA: Diagnosis not present

## 2020-04-02 DIAGNOSIS — Z79899 Other long term (current) drug therapy: Secondary | ICD-10-CM

## 2020-04-02 DIAGNOSIS — M069 Rheumatoid arthritis, unspecified: Secondary | ICD-10-CM | POA: Diagnosis not present

## 2020-04-02 DIAGNOSIS — I5033 Acute on chronic diastolic (congestive) heart failure: Secondary | ICD-10-CM | POA: Diagnosis not present

## 2020-04-02 DIAGNOSIS — N179 Acute kidney failure, unspecified: Secondary | ICD-10-CM | POA: Diagnosis not present

## 2020-04-02 DIAGNOSIS — J9601 Acute respiratory failure with hypoxia: Secondary | ICD-10-CM | POA: Diagnosis not present

## 2020-04-02 DIAGNOSIS — J811 Chronic pulmonary edema: Secondary | ICD-10-CM | POA: Diagnosis not present

## 2020-04-02 DIAGNOSIS — M2550 Pain in unspecified joint: Secondary | ICD-10-CM

## 2020-04-02 DIAGNOSIS — Z9049 Acquired absence of other specified parts of digestive tract: Secondary | ICD-10-CM

## 2020-04-02 DIAGNOSIS — Z888 Allergy status to other drugs, medicaments and biological substances status: Secondary | ICD-10-CM

## 2020-04-02 DIAGNOSIS — Z79890 Hormone replacement therapy (postmenopausal): Secondary | ICD-10-CM

## 2020-04-02 DIAGNOSIS — E039 Hypothyroidism, unspecified: Secondary | ICD-10-CM | POA: Diagnosis not present

## 2020-04-02 DIAGNOSIS — Z8249 Family history of ischemic heart disease and other diseases of the circulatory system: Secondary | ICD-10-CM

## 2020-04-02 HISTORY — DX: Nonrheumatic mitral (valve) insufficiency: I34.0

## 2020-04-02 HISTORY — DX: Essential (primary) hypertension: I10

## 2020-04-02 LAB — COMPREHENSIVE METABOLIC PANEL
ALT: 20 U/L (ref 0–44)
AST: 32 U/L (ref 15–41)
Albumin: 3.9 g/dL (ref 3.5–5.0)
Alkaline Phosphatase: 105 U/L (ref 38–126)
Anion gap: 15 (ref 5–15)
BUN: 36 mg/dL — ABNORMAL HIGH (ref 8–23)
CO2: 21 mmol/L — ABNORMAL LOW (ref 22–32)
Calcium: 8.7 mg/dL — ABNORMAL LOW (ref 8.9–10.3)
Chloride: 99 mmol/L (ref 98–111)
Creatinine, Ser: 2.37 mg/dL — ABNORMAL HIGH (ref 0.44–1.00)
GFR, Estimated: 20 mL/min — ABNORMAL LOW (ref >60)
Glucose, Bld: 133 mg/dL — ABNORMAL HIGH (ref 70–99)
Potassium: 5.7 mmol/L — ABNORMAL HIGH (ref 3.5–5.1)
Sodium: 135 mmol/L (ref 135–145)
Total Bilirubin: 1.1 mg/dL (ref 0.3–1.2)
Total Protein: 7.1 g/dL (ref 6.5–8.1)

## 2020-04-02 LAB — RESP PANEL BY RT-PCR (FLU A&B, COVID) ARPGX2
Influenza A by PCR: NEGATIVE
Influenza B by PCR: NEGATIVE
SARS Coronavirus 2 by RT PCR: NEGATIVE

## 2020-04-02 LAB — TROPONIN I (HIGH SENSITIVITY)
Troponin I (High Sensitivity): 20 ng/L — ABNORMAL HIGH (ref <18)
Troponin I (High Sensitivity): 21 ng/L — ABNORMAL HIGH (ref <18)
Troponin I (High Sensitivity): 19 ng/L — ABNORMAL HIGH (ref <18)
Troponin I (High Sensitivity): 17 ng/L (ref <18)

## 2020-04-02 LAB — CBC WITH DIFFERENTIAL/PLATELET
Abs Immature Granulocytes: 0.04 10*3/uL (ref 0.00–0.07)
Basophils Absolute: 0.1 10*3/uL (ref 0.0–0.1)
Basophils Relative: 1 %
Eosinophils Absolute: 0.1 10*3/uL (ref 0.0–0.5)
Eosinophils Relative: 1 %
HCT: 27.5 % — ABNORMAL LOW (ref 36.0–46.0)
Hemoglobin: 8 g/dL — ABNORMAL LOW (ref 12.0–15.0)
Immature Granulocytes: 0 %
Lymphocytes Relative: 20 %
Lymphs Abs: 1.9 10*3/uL (ref 0.7–4.0)
MCH: 25.7 pg — ABNORMAL LOW (ref 26.0–34.0)
MCHC: 29.1 g/dL — ABNORMAL LOW (ref 30.0–36.0)
MCV: 88.4 fL (ref 80.0–100.0)
Monocytes Absolute: 1.2 10*3/uL — ABNORMAL HIGH (ref 0.1–1.0)
Monocytes Relative: 12 %
Neutro Abs: 6.5 10*3/uL (ref 1.7–7.7)
Neutrophils Relative %: 66 %
Platelets: 463 10*3/uL — ABNORMAL HIGH (ref 150–400)
RBC: 3.11 MIL/uL — ABNORMAL LOW (ref 3.87–5.11)
RDW: 19.5 % — ABNORMAL HIGH (ref 11.5–15.5)
WBC: 9.8 10*3/uL (ref 4.0–10.5)
nRBC: 0.4 % — ABNORMAL HIGH (ref 0.0–0.2)

## 2020-04-02 LAB — ECHOCARDIOGRAM LIMITED
Height: 66 in
MV M vel: 4.93 m/s
MV Peak grad: 97.2 mmHg
Radius: 0.7 cm
S' Lateral: 3.62 cm
Weight: 2447.99 oz

## 2020-04-02 LAB — BRAIN NATRIURETIC PEPTIDE: B Natriuretic Peptide: 636 pg/mL — ABNORMAL HIGH (ref 0.0–100.0)

## 2020-04-02 LAB — PROCALCITONIN: Procalcitonin: <0.1 ng/mL

## 2020-04-02 LAB — MRSA PCR SCREENING: MRSA by PCR: NEGATIVE

## 2020-04-02 MED ORDER — ACETAMINOPHEN 325 MG PO TABS
650.0000 mg | ORAL_TABLET | Freq: Four times a day (QID) | ORAL | Status: DC | PRN
  Administered 2020-04-03 – 2020-04-07 (×3): 650 mg via ORAL
  Filled 2020-04-02 (×4): qty 2

## 2020-04-02 MED ORDER — ENOXAPARIN SODIUM 30 MG/0.3ML ~~LOC~~ SOLN: 30.0000 mg | SUBCUTANEOUS | Status: DC

## 2020-04-02 MED ORDER — CHLORHEXIDINE GLUCONATE CLOTH 2 % EX PADS
6.0000 | MEDICATED_PAD | Freq: Every day | CUTANEOUS | Status: DC
  Administered 2020-04-02 – 2020-04-09 (×8): 6 via TOPICAL

## 2020-04-02 MED ORDER — APIXABAN 2.5 MG PO TABS
2.5000 mg | ORAL_TABLET | Freq: Two times a day (BID) | ORAL | Status: DC
  Administered 2020-04-02 – 2020-04-09 (×15): 2.5 mg via ORAL
  Filled 2020-04-02 (×15): qty 1

## 2020-04-02 MED ORDER — FUROSEMIDE 10 MG/ML IJ SOLN
40.0000 mg | Freq: Two times a day (BID) | INTRAMUSCULAR | Status: DC
  Administered 2020-04-02: 40 mg via INTRAVENOUS
  Filled 2020-04-02: qty 4

## 2020-04-02 MED ORDER — APIXABAN 5 MG PO TABS: 5.0000 mg | ORAL_TABLET | Freq: Two times a day (BID) | ORAL | Status: DC

## 2020-04-02 MED ORDER — PANTOPRAZOLE SODIUM 40 MG PO TBEC
40.0000 mg | DELAYED_RELEASE_TABLET | Freq: Every day | ORAL | Status: DC
  Administered 2020-04-02 – 2020-04-09 (×8): 40 mg via ORAL
  Filled 2020-04-02 (×9): qty 1

## 2020-04-02 MED ORDER — ACETAMINOPHEN 650 MG RE SUPP
650.0000 mg | Freq: Four times a day (QID) | RECTAL | Status: DC | PRN
  Filled 2020-04-02: qty 1

## 2020-04-02 MED ORDER — LEVOTHYROXINE SODIUM 112 MCG PO TABS
112.0000 ug | ORAL_TABLET | Freq: Every day | ORAL | Status: DC
  Administered 2020-04-02 – 2020-04-09 (×8): 112 ug via ORAL
  Filled 2020-04-02 (×8): qty 1

## 2020-04-02 MED ORDER — PROCHLORPERAZINE EDISYLATE 10 MG/2ML IJ SOLN
10.0000 mg | Freq: Once | INTRAMUSCULAR | Status: AC
  Administered 2020-04-02: 10 mg via INTRAVENOUS
  Filled 2020-04-02: qty 2

## 2020-04-02 MED ORDER — FUROSEMIDE 10 MG/ML IJ SOLN
40.0000 mg | Freq: Three times a day (TID) | INTRAMUSCULAR | Status: DC
  Administered 2020-04-02 – 2020-04-04 (×6): 40 mg via INTRAVENOUS
  Filled 2020-04-02 (×8): qty 4

## 2020-04-02 MED ORDER — SODIUM ZIRCONIUM CYCLOSILICATE 5 G PO PACK
10.0000 g | PACK | Freq: Once | ORAL | Status: AC
  Administered 2020-04-02: 10 g via ORAL
  Filled 2020-04-02: qty 2

## 2020-04-02 MED ORDER — SODIUM POLYSTYRENE SULFONATE 15 GM/60ML PO SUSP: 30.0000 g | Freq: Once | ORAL | Status: DC

## 2020-04-02 MED ORDER — FUROSEMIDE 10 MG/ML IJ SOLN
INTRAMUSCULAR | Status: AC
  Filled 2020-04-02: qty 20

## 2020-04-02 MED ORDER — FUROSEMIDE 10 MG/ML IJ SOLN
120.0000 mg | Freq: Once | INTRAVENOUS | Status: AC
  Administered 2020-04-02: 120 mg via INTRAVENOUS
  Filled 2020-04-02: qty 12

## 2020-04-02 NOTE — ED Triage Notes (Signed)
Reports feeling SOB and having abd pain x 4-5 days that has been getting worse. Hx afib currently taking cardiazem. 89-90% on room air does not wear 02 at home.

## 2020-04-02 NOTE — ED Notes (Signed)
Pt currently on 4LNC 02 sats 90-91%, EDP aware, titrate 02 as needed, no further orders at this time.

## 2020-04-02 NOTE — Progress Notes (Addendum)
Patient Demographics:    Erin Schmidt, is a 87 y.o. female, DOB - Jun 14, 1932, KKX:[id number]  Admit date - 04/02/2020   Admitting Physician Bernadette Hoit, DO  Outpatient Primary MD for the patient is Sharilyn Sites, MD  LOS - 0   Chief Complaint  Patient presents with  . Abdominal Pain  . Shortness of Breath        Subjective:    Erin Schmidt today has no fevers, no emesis,  No chest pain,  Dyspnea and hypoxia persist---on 8L/min of oxygen, PTA was not on oxygen  Assessment  & Plan :    Principal Problem:   Acute exacerbation of CHF (congestive heart failure) (HCC) Active Problems:   Normocytic anemia   Hypothyroidism   Acute respiratory failure with hypoxia (HCC)   Thrombocytosis   Elevated brain natriuretic peptide (BNP) level   Elevated troponin   Essential hypertension   Acute kidney injury superimposed on CKD (HCC)   Hyperkalemia  Brief Summary:- 87 y.o. female with medical history significant for atrial fibrillation on Eliquis, hypertension, hypothyroidism, diastolic heart failure, rheumatoid arthritis and iron deficiency anemia  admitted on 04/02/2020 with increasing weight gain and acute on chronic diastolic dysfunction CHF exacerbation despite ardiology service increasing  patient's diuretics as outpatient  A/p 1)HFpEF/Acute on Chronic Diastolic CHF Exacer-last known EF 50 to 55% based on echo from October 2021 -Patient also had moderate mitral regurg -Repeat echo pending on 04/02/2020 -Clinical exam, chest x-ray and BNP consistent with CHF exacerbation -Change IV Lasix to 40 mg every 8 hours -Daily weight and fluid input and output monitoring  2)Acute Hypoxic Resp Failure:- PTA patient was not on O2 -Requiring oxygen at 8 L/min due to #1 above -Chest x-ray with possible pneumonia--patient without typical pneumonia symptoms, fevers, no leukocytosis and PCT is less than  0.10 -Hold off on antibiotics recheck chest x-ray in a.m. after additional diuresis  3)AKI----acute kidney injury on CKD stage -3B with hyperkalemia -Patient received Lokelma -Creatinine was 1.75 on 02/27/2020 -Monitor renal function closely while diuresing patient renally adjust medications, avoid nephrotoxic agents / dehydration  / hypotension  4) chronic normocytic anemia--- suspect anemia of chronic kidney disease, monitor closely transfuse as clinically indicated  5) hypothyroidism--  6) chronic atrial fibrillation--continue to hold Cardizem due to bradycardia -EKG with A. fib with slow ventricular response heart rate around 50 -c/n  Eliquis for stroke prophylaxis  7) social/ethics--- patient wants to be a DNI, but she is okay with CPR and ACLS protocols  --Total Care time is over 44 minutes  Disposition/Need for in-Hospital Stay- patient unable to be discharged at this time due to -acute diastolic dysfunction CHF exacerbation requiring IV diuresis and close monitoring of renal function and electrolytes, as well as acute hypoxic respiratory failure requiring significant amount of oxygen supplementation at this time*  Status is: Inpatient  Remains inpatient appropriate because:Please see above   Disposition: The patient is from: Home              Anticipated d/c is to: Home              Anticipated d/c date is: 2 days              Patient currently is not medically stable  to d/c. Barriers: Not Clinically Stable-   Code Status : -  Code Status: Partial Code   Family Communication:    (patient is alert, awake and coherent)   Consults  :    DVT Prophylaxis  :   - SCDs   apixaban (ELIQUIS) tablet 2.5 mg Start: 04/02/20 1000 SCDs Start: 04/02/20 0459 apixaban (ELIQUIS) tablet 2.5 mg    Lab Results  Component Value Date   PLT 463 (H) 04/02/2020    Inpatient Medications  Scheduled Meds: . apixaban  2.5 mg Oral BID  . furosemide  40 mg Intravenous Q12H  .  levothyroxine  112 mcg Oral Q0600  . pantoprazole  40 mg Oral Daily   Continuous Infusions: PRN Meds:.acetaminophen **OR** acetaminophen    Anti-infectives (From admission, onward)   None        Objective:   Vitals:   04/02/20 1000 04/02/20 1100 04/02/20 1105 04/02/20 1200  BP: 112/72 110/63  120/61  Pulse: 95 90 95 95  Resp: (!) 23 18 18 18   Temp:    (!) 97.3 F (36.3 C)  TempSrc:    Oral  SpO2: 91% 94% 91% 91%  Weight:      Height:        Wt Readings from Last 3 Encounters:  04/02/20 69.4 kg  03/09/20 66 kg  02/27/20 65 kg     Intake/Output Summary (Last 24 hours) at 04/02/2020 1301 Last data filed at 04/02/2020 1100 Gross per 24 hour  Intake 310.05 ml  Output 650 ml  Net -339.95 ml     Physical Exam  Gen:- Awake Alert,  In no apparent distress  HEENT:- [place].AT, No sclera icterus Nose- [place] 8L/min Neck-Supple Neck,No JVD,.  Lungs-diminished breath sounds, bibasilar rales CV- S1, S2 normal, irregular  Abd-  +ve B.Sounds, Abd Soft, No tenderness,    Extremity/Skin:-1-2+ edema, pedal pulses present Psych-affect is appropriate, oriented x3 Neuro-generalized weakness no new focal deficits, no tremors   Data Review:   Micro Results Recent Results (from the past 240 hour(s))  Resp Panel by RT-PCR (Flu A&B, Covid) Nasopharyngeal Swab     Status: None   Collection Time: 04/02/20  1:44 AM   Specimen: Nasopharyngeal Swab; Nasopharyngeal(NP) swabs in vial transport medium  Result Value Ref Range Status   SARS Coronavirus 2 by RT PCR NEGATIVE NEGATIVE Final    Comment: (NOTE) SARS-CoV-2 target nucleic acids are NOT DETECTED.  The SARS-CoV-2 RNA is generally detectable in upper respiratory specimens during the acute phase of infection. The lowest concentration of SARS-CoV-2 viral copies this assay can detect is 138 copies/mL. A negative result does not preclude SARS-Cov-2 infection and should not be used as the sole basis for treatment or other patient  management decisions. A negative result may occur with  improper specimen collection/handling, submission of specimen other than nasopharyngeal swab, presence of viral mutation(s) within the areas targeted by this assay, and inadequate number of viral copies(<138 copies/mL). A negative result must be combined with clinical observations, patient history, and epidemiological information. The expected result is Negative.  Fact Sheet for Patients:  EntrepreneurPulse.com.au  Fact Sheet for Healthcare Providers:  IncredibleEmployment.be  This test is no t yet approved or cleared by the [place] FDA and  has been authorized for detection and/or diagnosis of SARS-CoV-2 by FDA under an Emergency Use Authorization (EUA). This EUA will remain  in effect (meaning this test can be used) for the duration of the COVID-19 declaration under Section 564(b)(1) of the Act, 21  U.S.C.section 360bbb-3(b)(1), unless the authorization is terminated  or revoked sooner.       Influenza A by PCR NEGATIVE NEGATIVE Final   Influenza B by PCR NEGATIVE NEGATIVE Final    Comment: (NOTE) The Xpert Xpress SARS-CoV-2/FLU/RSV plus assay is intended as an aid in the diagnosis of influenza from Nasopharyngeal swab specimens and should not be used as a sole basis for treatment. Nasal washings and aspirates are unacceptable for Xpert Xpress SARS-CoV-2/FLU/RSV testing.  Fact Sheet for Patients: EntrepreneurPulse.com.au  Fact Sheet for Healthcare Providers: IncredibleEmployment.be  This test is not yet approved or cleared by the [place] FDA and has been authorized for detection and/or diagnosis of SARS-CoV-2 by FDA under an Emergency Use Authorization (EUA). This EUA will remain in effect (meaning this test can be used) for the duration of the COVID-19 declaration under Section 564(b)(1) of the Act, 21 U.S.C. section 360bbb-3(b)(1),  unless the authorization is terminated or revoked.  Performed at [facility], [address] [place], [place] [address]   MRSA PCR Screening     Status: None   Collection Time: 04/02/20  4:47 AM   Specimen: Nasal Mucosa; Nasopharyngeal  Result Value Ref Range Status   MRSA by PCR NEGATIVE NEGATIVE Final    Comment:        The GeneXpert MRSA Assay (FDA approved for NASAL specimens only), is one component of a comprehensive MRSA colonization surveillance program. It is not intended to diagnose MRSA infection nor to guide or monitor treatment for MRSA infections. Performed at [facility], [address] [place], [place] [address]     Radiology Reports DG Chest [place] 1 View  Result Date: 04/02/2020 CLINICAL DATA:  Shortness of breath for 4-5 days, worsening, COVID testing pending EXAM: PORTABLE CHEST 1 VIEW COMPARISON:  Radiograph 12/01/2019, CT 12/01/2019 FINDINGS: Chronically coarsened interstitial and bronchitic features with new patchy consolidative opacity in the right infrahilar lung and retrocardiac space as well as likely layering bilateral effusions. Mild pulmonary vascular congestion is present. Cardiac size is similar to prior though portions are obscured by overlying opacity. The aorta is calcified. The remaining cardiomediastinal contours are unremarkable. No other acute osseous or soft tissue abnormality. Degenerative changes are present in the imaged spine and shoulders. Telemetry leads overlie the chest. IMPRESSION: 1. Patchy consolidative opacity in the right infrahilar lung and retrocardiac space with likely layering bilateral effusions, could reflect pneumonia, asymmetric edema or combination there of. 2. Chronically coarsened interstitial and bronchitic features. 3.  Aortic Atherosclerosis (ICD10-I70.0). Electronically Signed   By: Lovena Le M.D.   On: 04/02/2020 02:07   MM 3D SCREEN BREAST BILATERAL  Result Date: 03/24/2020 CLINICAL DATA:  Screening. EXAM: DIGITAL  SCREENING BILATERAL MAMMOGRAM WITH TOMOSYNTHESIS AND CAD TECHNIQUE: Bilateral screening digital craniocaudal and mediolateral oblique mammograms were obtained. Bilateral screening digital breast tomosynthesis was performed. The images were evaluated with computer-aided detection. COMPARISON:  Previous exam(s). ACR Breast Density Category b: There are scattered areas of fibroglandular density. FINDINGS: There are no findings suspicious for malignancy. The images were evaluated with computer-aided detection. IMPRESSION: No mammographic evidence of malignancy. A result letter of this screening mammogram will be mailed directly to the patient. RECOMMENDATION: Screening mammogram in one year. (Code:[id number]) BI-RADS CATEGORY  1: Negative. Electronically Signed   By: Lajean Manes M.D.   On: 03/24/2020 14:23     CBC Recent Labs  Lab 04/02/20 0148  WBC 9.8  HGB 8.0*  HCT 27.5*  PLT 463*  MCV 88.4  MCH 25.7*  MCHC 29.1*  RDW 19.5*  LYMPHSABS 1.9  MONOABS 1.2*  EOSABS 0.1  BASOSABS 0.1    Chemistries  Recent Labs  Lab 04/02/20 0148  NA 135  K 5.7*  CL 99  CO2 21*  GLUCOSE 133*  BUN 36*  CREATININE 2.37*  CALCIUM 8.7*  AST 32  ALT 20  ALKPHOS 105  BILITOT 1.1   ------------------------------------------------------------------------------------------------------------------ No results for input(s): CHOL, HDL, LDLCALC, TRIG, CHOLHDL, LDLDIRECT in the last 72 hours.  No results found for: HGBA1C ------------------------------------------------------------------------------------------------------------------ No results for input(s): TSH, T4TOTAL, T3FREE, THYROIDAB in the last 72 hours.  Invalid input(s): FREET3 ------------------------------------------------------------------------------------------------------------------ No results for input(s): VITAMINB12, FOLATE, FERRITIN, TIBC, IRON, RETICCTPCT in the last 72 hours.  Coagulation profile No results for input(s): INR,  PROTIME in the last 168 hours.  No results for input(s): DDIMER in the last 72 hours.  Cardiac Enzymes No results for input(s): CKMB, TROPONINI, MYOGLOBIN in the last 168 hours.  Invalid input(s): CK ------------------------------------------------------------------------------------------------------------------    Component Value Date/Time   BNP 636.0 (H) 04/02/2020 0301     Roxan Hockey M.D on 04/02/2020 at 1:01 PM  Go to [email or web address] - for contact info  [facility] - Office  [phone]

## 2020-04-02 NOTE — Progress Notes (Signed)
  Echocardiogram 2D Echocardiogram has been performed.  Erin Schmidt 04/02/2020, 2:23 PM
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
## 2020-04-02 NOTE — H&P (Signed)
History and Physical  [name] Schmidt:[id number] DOB: February 15, 1932 DOA: 04/02/2020  Referring physician: Delora Fuel, MD PCP: Sharilyn Sites, MD  Patient coming from: Home  Chief Complaint: Shortness of breath  HPI: Erin Schmidt is a 87 y.o. female with medical history significant for  atrial fibrillation on Eliquis, hypertension, hypothyroidism, diastolic heart failure, rheumatoid arthritis and iron deficiency anemia who presents to the emergency department due to 1 week onset of increasing shortness of breath.  Patient states that she noted that she gained about 3 pounds, so she increased her torsemide dose which resulted in a subsequent loss of 4 pounds and improved breathing, so she went back to her normal prescribed dose and this resulted in regaining lost weight along with worsening shortness of breath which worsens on exertion and lying flat.  She endorsed increased leg swelling and increased abdominal girth, she also complained of nausea which was not relieved with Zofran.  She complained of some chest tightness which she thinks was due to increased efforts to breathe.  She denies fever, chills, cough or diarrhea.  EMS was activated, and on arrival of EMS, O2 sat was 89-90% on room air, supplemental oxygen via [place] was provided in route to the ED.  Of note, patient does not use oxygen at baseline.  ED Course: In the emergency department, she was tachypneic, bradycardic and BP was 110/58.  O2 sat was 92-96% on 6 LPM of oxygen via [place].  Work-up in the ED showed thrombocytosis and normocytic anemia.  BMP showed hyperkalemia, BUN/creatinine was 36/2.37 (baseline creatinine at 1.4-1.8).  BNP 636, troponin x1- 20.  SARS coronavirus 2 was negative.   Chest x-ray showed patchy consolidative opacity in the right infrahilar lung and retrocardiac space with likely layering bilateral effusions could reflect pneumonia, asymmetric edema or combination thereof. IV Lasix, Lokelma and Compazine were given  in the ED.  Hospitalist was asked to admit patient for further evaluation and management.  Review of Systems: Constitutional: Negative for chills and fever.  HENT: Negative for ear pain and sore throat.   Eyes: Negative for pain and visual disturbance.  Respiratory: Positive for shortness of breath.  Negative for cough Cardiovascular: Negative for chest pain and palpitations.  Gastrointestinal: Positive for abdominal pain and nausea.  Negative for vomiting.  Endocrine: Negative for polyphagia and polyuria.  Genitourinary: Negative for decreased urine volume, dysuria, enuresis, hematuria Musculoskeletal: Negative for arthralgias and back pain.  Skin: Negative for color change and rash.  Allergic/Immunologic: Negative for immunocompromised state.  Neurological: Negative for tremors, syncope, speech difficulty, weakness, light-headedness and headaches.  Hematological: Does not bruise/bleed easily.  All other systems reviewed and are negative   Past Medical History:  Diagnosis Date  . Arthritis   . Atrial fibrillation ([place])   . Dysrhythmia   . GERD (gastroesophageal reflux disease)   . Hyperlipemia   . Hypertension    preventative measures  . Hypothyroidism   . Iron deficiency anemia 05/07/2019  . Migraine   . Thyroid disease   . Vertigo    Past Surgical History:  Procedure Laterality Date  . APPENDECTOMY    . BREAST BIOPSY Right    benign  . BREAST EXCISIONAL BIOPSY Left    Benign  . BREAST EXCISIONAL BIOPSY Left    Benign  . BREAST LUMPECTOMY  30 yrs ago   Dr. Milbert Coulter- rt breast  . BREAST LUMPECTOMY  15 yrs ago   left- Dr. Romona Curls  . BREAST LUMPECTOMY  01/2011  . CARDIOVERSION N/A  11/10/2019   Procedure: CARDIOVERSION;  Surgeon: Arnoldo Lenis, MD;  Location: [facility];  Service: Endoscopy;  Laterality: N/A;  . CARDIOVERSION N/A 02/19/2020   Procedure: CARDIOVERSION;  Surgeon: Arnoldo Lenis, MD;  Location: [facility];  Service: Endoscopy;  Laterality: N/A;  .  CHOLECYSTECTOMY    . COLONOSCOPY    . COLONOSCOPY  04/13/2011   Procedure: COLONOSCOPY;  Surgeon: Rogene Houston, MD;  Location: [facility];  Service: Endoscopy;  Laterality: N/A;  930  . MASTECTOMY, PARTIAL  01/13/2011   Procedure: MASTECTOMY PARTIAL;  Surgeon: Adin Hector, MD;  Location: [facility];  Service: General;  Laterality: Left;  left partial mastectomy with needle locallization  . VAGINAL HYSTERECTOMY     2 partials    Social History:  reports that she quit smoking about 31 years ago. Her smoking use included cigarettes. She has a 20.00 pack-year smoking history. She has never used smokeless tobacco. She reports that she does not drink alcohol and does not use drugs.   Allergies  Allergen Reactions  . Feraheme [Ferumoxytol]     Reacted during infusion requiring medical intervention. Increased BP    Family History  Problem Relation Age of Onset  . Heart disease Mother   . Stroke Sister   . Heart disease Brother   . Atrial fibrillation Sister   . Coronary artery disease Brother   . Lung cancer Son   . Healthy Son   . Colon cancer Neg Hx      Prior to Admission medications   Medication Sig Start Date End Date Taking? Authorizing Provider  Abatacept (ORENCIA CLICKJECT) 212 MG/ML SOAJ Inject 125 mg into the skin every 7 (seven) days. 03/08/20   Ofilia Neas, PA-C  Calcium Carb-Cholecalciferol (CALCIUM 600 + D PO) Take 1 tablet by mouth 2 (two) times daily.    [provider]  cetirizine (ZYRTEC) 10 MG tablet Take 10 mg by mouth daily.    [provider]  cholecalciferol (VITAMIN D3) 25 MCG (1000 UNIT) tablet Take 1,000 Units by mouth daily.    [provider]  diltiazem (CARDIZEM CD) 240 MG 24 hr capsule Take 1 capsule (240 mg total) by mouth daily. 12/03/19   Barton Dubois, MD  ELIQUIS 5 MG TABS tablet TAKE 1 TABLET BY MOUTH TWICE A DAY Patient taking differently: Take 5 mg by mouth 2 (two) times daily. 02/03/20   Arnoldo Lenis, MD  escitalopram (LEXAPRO) 10 MG tablet Take 10 mg by mouth daily.    [provider]  ketotifen (ZADITOR) 0.025 % ophthalmic solution Place 1 drop into both eyes 2 (two) times daily as needed (allergies).    [provider]  levothyroxine (SYNTHROID, LEVOTHROID) 112 MCG tablet Take 112 mcg by mouth daily before breakfast.  05/02/16   [provider]  meclizine (ANTIVERT) 25 MG tablet Take 25 mg by mouth 3 (three) times daily as needed for dizziness.    [provider]  Melatonin 10 MG CAPS Take 10 mg by mouth at bedtime.    [provider]  omeprazole (PRILOSEC) 20 MG capsule Take 20 mg by mouth every Monday, Wednesday, and Friday.    [provider]  potassium chloride SA (KLOR-CON M20) 20 MEQ tablet Take 2 tablets (40 mEq total) by mouth 2 (two) times daily. 02/27/20 05/27/20  Strader, Fransisco Hertz, PA-C  torsemide (DEMADEX) 20 MG tablet Take 3 tablets (60 mg total) by mouth 2 (two) times daily. 03/31/20 06/29/20  Ahmed Prima, [place] M, PA-C  traMADol-acetaminophen (ULTRACET) 37.5-325 MG tablet Take 1 tablet by mouth in the morning and at bedtime.     [provider]    Physical Exam: BP (!) 110/58   Pulse (!) 50   Temp 97.8 F (36.6 C) (Axillary)   Resp 20   Ht _0  (1.676 m)   Wt 65.8 kg   SpO2 95%   BMI 23.40 kg/m   . General: 87 y.o. year-old female well developed well nourished in no acute distress.  Alert and oriented x3. Marland Kitchen HEENT: NCAT, EOMI . Neck: Supple, trachea medial . Cardiovascular: Regular rate and rhythm with no rubs or gallops.  No thyromegaly or JVD noted.  2/4 pulses in all 4 extremities. Marland Kitchen Respiratory: Bilateral rales in lower lobes on auscultation.  No wheezes. . Abdomen: Soft nontender nondistended with normal bowel sounds x4 quadrants. . Muskuloskeletal: +2 edema in lower extremities.  No cyanosis or clubbing. . Neuro: CN II-XII intact, sensation, reflexes intact . Skin: No ulcerative lesions  noted or rashes . Psychiatry: Judgement and insight appear normal. Mood is appropriate for condition and setting          Labs on Admission:  Basic Metabolic Panel: Recent Labs  Lab 04/02/20 0148  NA 135  K 5.7*  CL 99  CO2 21*  GLUCOSE 133*  BUN 36*  CREATININE 2.37*  CALCIUM 8.7*   Liver Function Tests: Recent Labs  Lab 04/02/20 0148  AST 32  ALT 20  ALKPHOS 105  BILITOT 1.1  PROT 7.1  ALBUMIN 3.9   No results for input(s): LIPASE, AMYLASE in the last 168 hours. No results for input(s): AMMONIA in the last 168 hours. CBC: Recent Labs  Lab 04/02/20 0148  WBC 9.8  NEUTROABS 6.5  HGB 8.0*  HCT 27.5*  MCV 88.4  PLT 463*   Cardiac Enzymes: No results for input(s): CKTOTAL, CKMB, CKMBINDEX, TROPONINI in the last 168 hours.  BNP (last 3 results) Recent Labs    12/01/19 0613 04/02/20 0148  BNP 701.0* 636.0*    ProBNP (last 3 results) No results for input(s): PROBNP in the last 8760 hours.  CBG: No results for input(s): GLUCAP in the last 168 hours.  Radiological Exams on Admission: DG Chest Port 1 View  Result Date: 04/02/2020 CLINICAL DATA:  Shortness of breath for 4-5 days, worsening, COVID testing pending EXAM: PORTABLE CHEST 1 VIEW COMPARISON:  Radiograph 12/01/2019, CT 12/01/2019 FINDINGS: Chronically coarsened interstitial and bronchitic features with new patchy consolidative opacity in the right infrahilar lung and retrocardiac space as well as likely layering bilateral effusions. Mild pulmonary vascular congestion is present. Cardiac size is similar to prior though portions are obscured by overlying opacity. The aorta is calcified. The remaining cardiomediastinal contours are unremarkable. No other acute osseous or soft tissue abnormality. Degenerative changes are present in the imaged spine and shoulders. Telemetry leads overlie the chest. IMPRESSION: 1. Patchy consolidative opacity in the right infrahilar lung and retrocardiac space with likely  layering bilateral effusions, could reflect pneumonia, asymmetric edema or combination there of. 2. Chronically coarsened interstitial and bronchitic features. 3.  Aortic Atherosclerosis (ICD10-I70.0). Electronically Signed   By: Lovena Le M.D.   On: 04/02/2020 02:07    EKG: I independently viewed the EKG done and my findings are as followed: Atrial fibrillation with slow ventricular response  Assessment/Plan Present on Admission: . Hypothyroidism  Principal Problem:   Acute exacerbation of CHF (congestive heart failure) (HCC) Active Problems:   Normocytic anemia  Hypothyroidism   Acute respiratory failure with hypoxia (HCC)   Thrombocytosis   Elevated brain natriuretic peptide (BNP) level   Elevated troponin   Essential hypertension   Acute kidney injury superimposed on CKD (HCC)   Acute respiratory failure with hypoxia secondary to acute exacerbation of CHF Continue total input/output, daily weights and fluid restriction Continue IV Lasix 40 twice daily with holding parameters Continue Cardiac diet  Echocardiogram done on 10/16/2019 showed LVEF of 50-3 5%.  Echocardiogram will be done in the morning  Continue supplemental oxygen via [place] with plan to wean patient off oxygen as tolerated (patient does not use oxygen at baseline)  Elevated BNP (chronically elevated) BNP 636 (this was 701 about 4 months ago) Echocardiogram in the morning as described above  Elevated troponin possibly secondary to type II demand ischemia She complained of chest tightness which she thought was related to increased work of breathing Troponin x2- 20 > 21 Continue to trend troponin  Hyperkalemia K+ 5.7; no EKG changes IV Lasix was given Lokelma was given Continue to monitor K+ level with morning labs  Thrombocytosis possibly reactive Platelets 463, continue to monitor platelets with morning labs  Acute kidney injury superimposed on CKD 4 BUN/creatinine was 36/2.37 (baseline creatinine at  1.4-1.8). eGFR at 20 Unfortunately, IV fluid currently restricted due to acute exacerbation of CHF Renally adjust medications, avoid nephrotoxic agents/dehydration/hypotension  Questionable pneumonia per chest x-ray Chest x-ray showed patchy consolidative opacity in the right infrahilar lung and retrocardiac space with likely layering bilateral effusions could reflect pneumonia, asymmetric edema or combination thereof. Procalcitonin will be checked prior to starting any antibiotic  Normocytic anemia  This is possibly secondary to history of CKD H/H at 8.0/27.5-decreased from 10.5/34.8 on 02/02/2020 No obvious sign of bleeding Continue to monitor H/H with morning labs  Atrial fibrillation Cardizem will be held at this time due to slow ventricular response Continue Eliquis  Essential hypertension Continue IV Lasix  Hypothyroidism Continue Synthroid  GERD Continue Protonix   DVT prophylaxis: Eliquis  Code Status: Full code  Family Communication: None at bedside  Disposition Plan:  Patient is from:                        home Anticipated DC to:                   SNF or family members home Anticipated DC date:               2-3 days Anticipated DC barriers:         patient is unstable to be discharged at this time due to being hypoxic due to acute exacerbation of CHF  Consults called: None  Admission status: Inpatient    Bernadette Hoit MD [facility]  04/02/2020, 4:57 AM

## 2020-04-02 NOTE — ED Notes (Signed)
Pt up to bedside commode, returned to bed, changed into gown, nonskid socks in place, bed locked and low, side rails up x 2, call bell within reach. Will continue to monitor.

## 2020-04-03 ENCOUNTER — Inpatient Hospital Stay (HOSPITAL_COMMUNITY): Payer: Medicare Other

## 2020-04-03 LAB — COMPREHENSIVE METABOLIC PANEL
ALT: 24 U/L (ref 0–44)
AST: 32 U/L (ref 15–41)
Albumin: 3.5 g/dL (ref 3.5–5.0)
Alkaline Phosphatase: 96 U/L (ref 38–126)
Anion gap: 13 (ref 5–15)
BUN: 33 mg/dL — ABNORMAL HIGH (ref 8–23)
CO2: 25 mmol/L (ref 22–32)
Calcium: 8.2 mg/dL — ABNORMAL LOW (ref 8.9–10.3)
Chloride: 98 mmol/L (ref 98–111)
Creatinine, Ser: 1.6 mg/dL — ABNORMAL HIGH (ref 0.44–1.00)
GFR, Estimated: 31 mL/min — ABNORMAL LOW (ref >60)
Glucose, Bld: 97 mg/dL (ref 70–99)
Potassium: 2.7 mmol/L — CL (ref 3.5–5.1)
Sodium: 136 mmol/L (ref 135–145)
Total Bilirubin: 1.1 mg/dL (ref 0.3–1.2)
Total Protein: 6.4 g/dL — ABNORMAL LOW (ref 6.5–8.1)

## 2020-04-03 LAB — CBC
HCT: 25.3 % — ABNORMAL LOW (ref 36.0–46.0)
Hemoglobin: 7.4 g/dL — ABNORMAL LOW (ref 12.0–15.0)
MCH: 25.4 pg — ABNORMAL LOW (ref 26.0–34.0)
MCHC: 29.2 g/dL — ABNORMAL LOW (ref 30.0–36.0)
MCV: 86.9 fL (ref 80.0–100.0)
Platelets: 347 10*3/uL (ref 150–400)
RBC: 2.91 MIL/uL — ABNORMAL LOW (ref 3.87–5.11)
RDW: 19.6 % — ABNORMAL HIGH (ref 11.5–15.5)
WBC: 8.8 10*3/uL (ref 4.0–10.5)
nRBC: 0.2 % (ref 0.0–0.2)

## 2020-04-03 LAB — RENAL FUNCTION PANEL
Albumin: 3.5 g/dL (ref 3.5–5.0)
Anion gap: 13 (ref 5–15)
BUN: 32 mg/dL — ABNORMAL HIGH (ref 8–23)
CO2: 27 mmol/L (ref 22–32)
Calcium: 8.3 mg/dL — ABNORMAL LOW (ref 8.9–10.3)
Chloride: 98 mmol/L (ref 98–111)
Creatinine, Ser: 1.61 mg/dL — ABNORMAL HIGH (ref 0.44–1.00)
GFR, Estimated: 31 mL/min — ABNORMAL LOW (ref >60)
Glucose, Bld: 97 mg/dL (ref 70–99)
Phosphorus: 3.9 mg/dL (ref 2.5–4.6)
Potassium: 2.7 mmol/L — CL (ref 3.5–5.1)
Sodium: 138 mmol/L (ref 135–145)

## 2020-04-03 LAB — PROTIME-INR
INR: 2 — ABNORMAL HIGH (ref 0.8–1.2)
Prothrombin Time: 21.6 seconds — ABNORMAL HIGH (ref 11.4–15.2)

## 2020-04-03 LAB — APTT: aPTT: 44 seconds — ABNORMAL HIGH (ref 24–36)

## 2020-04-03 MED ORDER — METHOCARBAMOL 500 MG PO TABS
500.0000 mg | ORAL_TABLET | Freq: Once | ORAL | Status: AC
  Administered 2020-04-03: 500 mg via ORAL
  Filled 2020-04-03: qty 1

## 2020-04-03 MED ORDER — POTASSIUM CHLORIDE CRYS ER 20 MEQ PO TBCR
40.0000 meq | EXTENDED_RELEASE_TABLET | ORAL | Status: AC
Start: 2020-04-03 — End: 2020-04-03
  Administered 2020-04-03 (×2): 40 meq via ORAL
  Filled 2020-04-03 (×3): qty 2

## 2020-04-03 MED ORDER — TRAMADOL HCL 50 MG PO TABS
50.0000 mg | ORAL_TABLET | Freq: Three times a day (TID) | ORAL | Status: DC | PRN
  Administered 2020-04-03 – 2020-04-09 (×8): 50 mg via ORAL
  Filled 2020-04-03 (×9): qty 1

## 2020-04-03 NOTE — Progress Notes (Signed)
Patient Demographics:    Erin Schmidt, is a 87 y.o. female, DOB - [birth year], HQP:[id number]  Admit date - 04/02/2020   Admitting Physician Bernadette Hoit, DO  Outpatient Primary MD for the patient is Sharilyn Sites, MD  LOS - 1   Chief Complaint  Patient presents with   Abdominal Pain   Shortness of Breath        Subjective:    Erin Schmidt today has no fevers, no emesis,  No chest pain,  Dyspnea on exertion is not worse - hypoxia improving, down to 4 L of oxygen from 8L/min of oxygen, PTA was not on oxygen  Assessment  & Plan :    Principal Problem:   Acute exacerbation of CHF (congestive heart failure) (HCC) Active Problems:   Normocytic anemia   Hypothyroidism   Acute respiratory failure with hypoxia (HCC)   Thrombocytosis   Elevated brain natriuretic peptide (BNP) level   Elevated troponin   Essential hypertension   Acute kidney injury superimposed on CKD (HCC)   Hyperkalemia  Brief Summary:- 87 y.o. female with medical history significant for atrial fibrillation on Eliquis, hypertension, hypothyroidism, diastolic heart failure, rheumatoid arthritis and iron deficiency anemia  admitted on 04/02/2020 with increasing weight gain and acute on chronic diastolic dysfunction CHF exacerbation despite ardiology service increasing  patient's diuretics as outpatient  A/p 1)HFpEF/Acute on Chronic Diastolic CHF Exacer- -Repeat echo shows that MR is now severe, it was previously moderate mitral regurg -Repeat echo also shows moderate TR -Repeat echo with EF down to 45 to 50% from 50-55 patient back in October 2021 -Clinical exam, chest x-ray and BNP consistent with CHF exacerbation - c/n IV Lasix to 40 mg every 8 hours -Daily weight and fluid input and output monitoring -Repeat chest x-ray bilateral pleural effusion ultrasound thoracentesis requested  2)Acute Hypoxic Resp Failure:- PTA  patient was not on O2 -Oxygen requirement improved patient is down to 4 L of oxygen via nasal cannula from 8 L -Chest x-ray with possible pneumonia--patient without typical pneumonia symptoms, fevers, no leukocytosis and PCT is less than 0.10 -Repeat chest x-ray as above #1 -Hold off on antibiotics   3)AKI----acute kidney injury on CKD stage -3B with hyperkalemia- hyperkalemia resolved with Lokelma -Creatinine was 1.75 on 02/27/2020 -Continues down to 1.61 from a peak of 2.31 04/02/2020 -Monitor renal function closely while diuresing patient renally adjust medications, avoid nephrotoxic agents / dehydration  / hypotension  4) chronic normocytic anemia--- suspect anemia of chronic kidney disease, monitor closely transfuse as clinically indicated -Hemoglobin is 7.4  5)Hypothyroidism-- c/n levothyroxine 112 mcg daily  6) chronic atrial fibrillation--continue to hold Cardizem due to bradycardia -EKG with A. fib with slow ventricular response heart rate around 50 -c/n  Eliquis for stroke prophylaxis  7) social/ethics--- patient wants to be a DNI, but she is okay with CPR and ACLS protocols   Disposition/Need for in-Hospital Stay- patient unable to be discharged at this time due to -acute diastolic dysfunction CHF exacerbation requiring IV diuresis and close monitoring of renal function and electrolytes, as well as acute hypoxic respiratory failure requiring oxygen supplementation at this time*  Status is: Inpatient  Remains inpatient appropriate because:Please see above   Disposition: The patient is from: Home  Anticipated d/c is to: Home              Anticipated d/c date is: 2 days              Patient currently is not medically stable to d/c. Barriers: Not Clinically Stable-   Code Status : -  Code Status: Partial Code   Family Communication:    (patient is alert, awake and coherent)   Consults  :    DVT Prophylaxis  :   - SCDs   Place TED hose Start: 04/02/20  1307 apixaban (ELIQUIS) tablet 2.5 mg Start: 04/02/20 1000 SCDs Start: 04/02/20 0459 apixaban (ELIQUIS) tablet 2.5 mg    Lab Results  Component Value Date   PLT 347 04/03/2020    Inpatient Medications  Scheduled Meds:  apixaban  2.5 mg Oral BID   Chlorhexidine Gluconate Cloth  6 each Topical Daily   furosemide  40 mg Intravenous Q8H   levothyroxine  112 mcg Oral Q0600   pantoprazole  40 mg Oral Daily   potassium chloride  40 mEq Oral Q3H   Continuous Infusions: PRN Meds:.acetaminophen **OR** acetaminophen    Anti-infectives (From admission, onward)   None        Objective:   Vitals:   04/03/20 0400 04/03/20 0430 04/03/20 0500 04/03/20 0742  BP: 117/77  114/65   Pulse: (!) 113 (!) 109 (!) 110 (!) 116  Resp: 18 (!) 22 (!) 23 (!) 24  Temp:  98.5 F (36.9 C)  99.4 F (37.4 C)  TempSrc:  Oral  Oral  SpO2: 94% 95% 94% 90%  Weight:   69 kg   Height:        Wt Readings from Last 3 Encounters:  04/03/20 69 kg  03/09/20 66 kg  02/27/20 65 kg     Intake/Output Summary (Last 24 hours) at 04/03/2020 1235 Last data filed at 04/03/2020 0100 Gross per 24 hour  Intake 250 ml  Output 1750 ml  Net -1500 ml     Physical Exam  Gen:- Awake Alert,  In no apparent distress  HEENT:- [place].AT, No sclera icterus Nose- [place] 4L/min Neck-Supple Neck,No JVD,.  Lungs-diminished breath sounds, bibasilar rales CV- S1, S2 normal, irregular  Abd-  +ve B.Sounds, Abd Soft, No tenderness,    Extremity/Skin:-1-2+ edema, pedal pulses present Psych-affect is appropriate, oriented x3 Neuro-generalized weakness no new focal deficits, no tremors   Data Review:   Micro Results Recent Results (from the past 240 hour(s))  Resp Panel by RT-PCR (Flu A&B, Covid) Nasopharyngeal Swab     Status: None   Collection Time: 04/02/20  1:44 AM   Specimen: Nasopharyngeal Swab; Nasopharyngeal(NP) swabs in vial transport medium  Result Value Ref Range Status   SARS Coronavirus 2 by RT PCR  NEGATIVE NEGATIVE Final    Comment: (NOTE) SARS-CoV-2 target nucleic acids are NOT DETECTED.  The SARS-CoV-2 RNA is generally detectable in upper respiratory specimens during the acute phase of infection. The lowest concentration of SARS-CoV-2 viral copies this assay can detect is 138 copies/mL. A negative result does not preclude SARS-Cov-2 infection and should not be used as the sole basis for treatment or other patient management decisions. A negative result may occur with  improper specimen collection/handling, submission of specimen other than nasopharyngeal swab, presence of viral mutation(s) within the areas targeted by this assay, and inadequate number of viral copies(<138 copies/mL). A negative result must be combined with clinical observations, patient history, and epidemiological information. The expected result is Negative.  Fact Sheet for Patients:  EntrepreneurPulse.com.au  Fact Sheet for Healthcare Providers:  IncredibleEmployment.be  This test is no t yet approved or cleared by the [place] FDA and  has been authorized for detection and/or diagnosis of SARS-CoV-2 by FDA under an Emergency Use Authorization (EUA). This EUA will remain  in effect (meaning this test can be used) for the duration of the COVID-19 declaration under Section 564(b)(1) of the Act, 21 U.S.C.section 360bbb-3(b)(1), unless the authorization is terminated  or revoked sooner.       Influenza A by PCR NEGATIVE NEGATIVE Final   Influenza B by PCR NEGATIVE NEGATIVE Final    Comment: (NOTE) The Xpert Xpress SARS-CoV-2/FLU/RSV plus assay is intended as an aid in the diagnosis of influenza from Nasopharyngeal swab specimens and should not be used as a sole basis for treatment. Nasal washings and aspirates are unacceptable for Xpert Xpress SARS-CoV-2/FLU/RSV testing.  Fact Sheet for Patients: EntrepreneurPulse.com.au  Fact Sheet for  Healthcare Providers: IncredibleEmployment.be  This test is not yet approved or cleared by the [place] FDA and has been authorized for detection and/or diagnosis of SARS-CoV-2 by FDA under an Emergency Use Authorization (EUA). This EUA will remain in effect (meaning this test can be used) for the duration of the COVID-19 declaration under Section 564(b)(1) of the Act, 21 U.S.C. section 360bbb-3(b)(1), unless the authorization is terminated or revoked.  Performed at [facility], [address] [place], [place] [address]   MRSA PCR Screening     Status: None   Collection Time: 04/02/20  4:47 AM   Specimen: Nasal Mucosa; Nasopharyngeal  Result Value Ref Range Status   MRSA by PCR NEGATIVE NEGATIVE Final    Comment:        The GeneXpert MRSA Assay (FDA approved for NASAL specimens only), is one component of a comprehensive MRSA colonization surveillance program. It is not intended to diagnose MRSA infection nor to guide or monitor treatment for MRSA infections. Performed at [facility], [address], [place], [place] [address]     Radiology Reports DG Chest 2 View  Result Date: 04/03/2020 CLINICAL DATA:  Shortness of breath for 1 week. EXAM: CHEST - 2 VIEW COMPARISON:  04/02/2020 and prior exams FINDINGS: Enlargement of the cardiopericardial silhouette is noted. Moderate bilateral pleural effusions and bilateral LOWER lung atelectasis/consolidation noted. No pneumothorax or acute bony abnormality. IMPRESSION: Enlargement of the cardiopericardial silhouette with moderate bilateral pleural effusions and bilateral LOWER lung atelectasis/consolidation. Electronically Signed   By: Margarette Canada M.D.   On: 04/03/2020 10:47   DG Chest Port 1 View  Result Date: 04/02/2020 CLINICAL DATA:  Shortness of breath for 4-5 days, worsening, COVID testing pending EXAM: PORTABLE CHEST 1 VIEW COMPARISON:  Radiograph 12/01/2019, CT 12/01/2019 FINDINGS: Chronically coarsened  interstitial and bronchitic features with new patchy consolidative opacity in the right infrahilar lung and retrocardiac space as well as likely layering bilateral effusions. Mild pulmonary vascular congestion is present. Cardiac size is similar to prior though portions are obscured by overlying opacity. The aorta is calcified. The remaining cardiomediastinal contours are unremarkable. No other acute osseous or soft tissue abnormality. Degenerative changes are present in the imaged spine and shoulders. Telemetry leads overlie the chest. IMPRESSION: 1. Patchy consolidative opacity in the right infrahilar lung and retrocardiac space with likely layering bilateral effusions, could reflect pneumonia, asymmetric edema or combination there of. 2. Chronically coarsened interstitial and bronchitic features. 3.  Aortic Atherosclerosis (ICD10-I70.0). Electronically Signed   By: Elwin Sleight.D.  On: 04/02/2020 02:07   MM 3D SCREEN BREAST BILATERAL  Result Date: 03/24/2020 CLINICAL DATA:  Screening. EXAM: DIGITAL SCREENING BILATERAL MAMMOGRAM WITH TOMOSYNTHESIS AND CAD TECHNIQUE: Bilateral screening digital craniocaudal and mediolateral oblique mammograms were obtained. Bilateral screening digital breast tomosynthesis was performed. The images were evaluated with computer-aided detection. COMPARISON:  Previous exam(s). ACR Breast Density Category b: There are scattered areas of fibroglandular density. FINDINGS: There are no findings suspicious for malignancy. The images were evaluated with computer-aided detection. IMPRESSION: No mammographic evidence of malignancy. A result letter of this screening mammogram will be mailed directly to the patient. RECOMMENDATION: Screening mammogram in one year. (Code:SM-B-01Y) BI-RADS CATEGORY  1: Negative. Electronically Signed   By: Lajean Manes M.D.   On: 03/24/2020 14:23   ECHOCARDIOGRAM LIMITED  Result Date: 04/02/2020    ECHOCARDIOGRAM LIMITED REPORT   Patient Name:    Erin Schmidt Date of Exam: 04/02/2020 Medical Rec #:  [id number]         Height:       66.0 in Accession #:    [id number]        Weight:       153.0 lb Date of Birth:  [birth year]         BSA:          1.785 m Patient Age:    54 years          BP:           95/63 mmHg Patient Gender: F                 HR:           112 bpm. Exam Location:  [facility] Procedure: Limited Echo, Cardiac Doppler and Color Doppler Indications:    CHF-Acute Diastolic  History:        Patient has prior history of Echocardiogram examinations, most                 recent 10/16/2019. CHF, Arrythmias:Atrial Fibrillation,                 Signs/Symptoms:Shortness of Breath; Risk Factors:Hypertension                 and Former Smoker. DOE. Orthopnea. Elevated troponin.  Sonographer:    Clayton Lefort RDCS (AE) Referring Phys: [id number] OLADAPO ADEFESO IMPRESSIONS  1. Hypokinesis of the inferior wall (base, mid). Left ventricular ejection fraction, by estimation, is 45 to 50%. The left ventricle has mildly decreased function. There is mild left ventricular hypertrophy. Left ventricular diastolic parameters are indeterminate.  2. Right ventricular systolic function is low normal. The right ventricular size is mildly enlarged. There is moderately elevated pulmonary artery systolic pressure.  3. Left atrial size was mildly dilated.  4. Right atrial size was moderately dilated.  5. Large pleural effusion.  6. There are a couple MR jets; the largest is eccentric directed posteriorly along posterior wall of LA COmpared to previous report, MR is more severe. . The mitral valve is abnormal. Severe mitral valve regurgitation.  7. Tricuspid valve regurgitation is moderate.  8. The aortic valve is tricuspid. Aortic valve regurgitation is not visualized. Mild to moderate aortic valve sclerosis/calcification is present, without any evidence of aortic stenosis.  9. The inferior vena cava is dilated in size with <50% respiratory variability, suggesting right atrial  pressure of 15 mmHg. FINDINGS  Left Ventricle: Hypokinesis of the inferior wall (base, mid). Left ventricular ejection fraction, by estimation, is 45 to 50%.  The left ventricle has mildly decreased function. The left ventricular internal cavity size was normal in size. There is mild left ventricular hypertrophy. Left ventricular diastolic parameters are indeterminate. Right Ventricle: The right ventricular size is mildly enlarged. Right vetricular wall thickness was not assessed. Right ventricular systolic function is low normal. There is moderately elevated pulmonary artery systolic pressure. The tricuspid regurgitant velocity is 3.15 m/s, and with an assumed right atrial pressure of 15 mmHg, the estimated right ventricular systolic pressure is 93.8 mmHg. Left Atrium: Left atrial size was mildly dilated. Right Atrium: Right atrial size was moderately dilated. Pericardium: Trivial pericardial effusion is present. Mitral Valve: There are a couple MR jets; the largest is eccentric directed posteriorly along posterior wall of LA COmpared to previous report, MR is more severe. The mitral valve is abnormal. There is moderate thickening of the mitral valve leaflet(s). Mild mitral annular calcification. Severe mitral valve regurgitation. Tricuspid Valve: The tricuspid valve is normal in structure. Tricuspid valve regurgitation is moderate. Aortic Valve: The aortic valve is tricuspid. Aortic valve regurgitation is not visualized. Mild to moderate aortic valve sclerosis/calcification is present, without any evidence of aortic stenosis. Pulmonic Valve: The pulmonic valve was not well visualized. Pulmonic valve regurgitation is not visualized. Aorta: The aortic root and ascending aorta are structurally normal, with no evidence of dilitation. Venous: The inferior vena cava is dilated in size with less than 50% respiratory variability, suggesting right atrial pressure of 15 mmHg. IAS/Shunts: No atrial level shunt detected by  color flow Doppler. Additional Comments: There is a large pleural effusion. LEFT VENTRICLE PLAX 2D LVIDd:         4.71 cm LVIDs:         3.62 cm LV PW:         1.29 cm LV IVS:        1.35 cm LVOT diam:     1.80 cm LVOT Area:     2.54 cm  LEFT ATRIUM         Index LA diam:    4.90 cm 2.75 cm/m   AORTA Ao Root diam: 2.80 cm Ao Asc diam:  3.30 cm MR Peak grad:    97.2 mmHg   TRICUSPID VALVE MR Mean grad:    61.0 mmHg   TR Peak grad:   39.7 mmHg MR Vmax:         493.00 cm/s TR Vmax:        315.00 cm/s MR Vmean:        359.0 cm/s MR PISA:         3.08 cm    SHUNTS MR PISA Eff ROA: 24 mm      Systemic Diam: 1.80 cm MR PISA Radius:  0.70 cm Dorris Carnes MD Electronically signed by Dorris Carnes MD Signature Date/Time: 04/02/2020/5:26:49 PM    Final      CBC Recent Labs  Lab 04/02/20 0148 04/03/20 0401  WBC 9.8 8.8  HGB 8.0* 7.4*  HCT 27.5* 25.3*  PLT 463* 347  MCV 88.4 86.9  MCH 25.7* 25.4*  MCHC 29.1* 29.2*  RDW 19.5* 19.6*  LYMPHSABS 1.9  --   MONOABS 1.2*  --   EOSABS 0.1  --   BASOSABS 0.1  --     Chemistries  Recent Labs  Lab 04/02/20 0148 04/03/20 0401  NA 135 138   136  K 5.7* 2.7*   2.7*  CL 99 98   98  CO2 21* 27   25  GLUCOSE 133*  97   97  BUN 36* 32*   33*  CREATININE 2.37* 1.61*   1.60*  CALCIUM 8.7* 8.3*   8.2*  AST 32 32  ALT 20 24  ALKPHOS 105 96  BILITOT 1.1 1.1   ------------------------------------------------------------------------------------------------------------------ No results for input(s): CHOL, HDL, LDLCALC, TRIG, CHOLHDL, LDLDIRECT in the last 72 hours.  No results found for: HGBA1C ------------------------------------------------------------------------------------------------------------------ No results for input(s): TSH, T4TOTAL, T3FREE, THYROIDAB in the last 72 hours.  Invalid input(s): FREET3 ------------------------------------------------------------------------------------------------------------------ No results for input(s): VITAMINB12,  FOLATE, FERRITIN, TIBC, IRON, RETICCTPCT in the last 72 hours.  Coagulation profile Recent Labs  Lab 04/03/20 0401  INR 2.0*    No results for input(s): DDIMER in the last 72 hours.  Cardiac Enzymes No results for input(s): CKMB, TROPONINI, MYOGLOBIN in the last 168 hours.  Invalid input(s): CK ------------------------------------------------------------------------------------------------------------------    Component Value Date/Time   BNP 636.0 (H) 04/02/2020 6244     Roxan Hockey M.D on 04/03/2020 at 12:35 PM  Go to [email or web address] - for contact info  [facility] - Office  [phone]

## 2020-04-04 LAB — CBC
HCT: 26.9 % — ABNORMAL LOW (ref 36.0–46.0)
Hemoglobin: 7.9 g/dL — ABNORMAL LOW (ref 12.0–15.0)
MCH: 25.4 pg — ABNORMAL LOW (ref 26.0–34.0)
MCHC: 29.4 g/dL — ABNORMAL LOW (ref 30.0–36.0)
MCV: 86.5 fL (ref 80.0–100.0)
Platelets: 347 10*3/uL (ref 150–400)
RBC: 3.11 MIL/uL — ABNORMAL LOW (ref 3.87–5.11)
RDW: 19.4 % — ABNORMAL HIGH (ref 11.5–15.5)
WBC: 11.8 10*3/uL — ABNORMAL HIGH (ref 4.0–10.5)
nRBC: 0 % (ref 0.0–0.2)

## 2020-04-04 LAB — RENAL FUNCTION PANEL
Albumin: 3.5 g/dL (ref 3.5–5.0)
Anion gap: 11 (ref 5–15)
BUN: 28 mg/dL — ABNORMAL HIGH (ref 8–23)
CO2: 26 mmol/L (ref 22–32)
Calcium: 8.5 mg/dL — ABNORMAL LOW (ref 8.9–10.3)
Chloride: 99 mmol/L (ref 98–111)
Creatinine, Ser: 1.36 mg/dL — ABNORMAL HIGH (ref 0.44–1.00)
GFR, Estimated: 38 mL/min — ABNORMAL LOW (ref >60)
Glucose, Bld: 109 mg/dL — ABNORMAL HIGH (ref 70–99)
Phosphorus: 3.3 mg/dL (ref 2.5–4.6)
Potassium: 3.6 mmol/L (ref 3.5–5.1)
Sodium: 136 mmol/L (ref 135–145)

## 2020-04-04 MED ORDER — METOPROLOL TARTRATE 25 MG PO TABS
25.0000 mg | ORAL_TABLET | Freq: Two times a day (BID) | ORAL | Status: DC
  Administered 2020-04-04: 25 mg via ORAL
  Filled 2020-04-04 (×3): qty 1

## 2020-04-04 MED ORDER — METHOCARBAMOL 500 MG PO TABS
750.0000 mg | ORAL_TABLET | Freq: Two times a day (BID) | ORAL | Status: DC | PRN
  Administered 2020-04-04 – 2020-04-08 (×4): 750 mg via ORAL
  Filled 2020-04-04 (×4): qty 2

## 2020-04-04 NOTE — Plan of Care (Signed)

## 2020-04-04 NOTE — Progress Notes (Signed)
Patient Demographics:    Erin Schmidt, is a 87 y.o. female, DOB - 1932-12-03, VWU:[id number]  Admit date - 04/02/2020   Admitting Physician Bernadette Hoit, DO  Outpatient Primary MD for the patient is Sharilyn Sites, MD  LOS - 2   Chief Complaint  Patient presents with   Abdominal Pain   Shortness of Breath        Subjective:    [name] today has no fevers, no emesis,  No chest pain,  -Dyspnea on exertion is improving -Hypoxia improving down to 2 L of oxygen via nasal cannula -Husband and son at bedside  Assessment  & Plan :    Principal Problem:   Acute exacerbation of CHF (congestive heart failure) (HCC) Active Problems:   Normocytic anemia   Hypothyroidism   Acute respiratory failure with hypoxia (HCC)   Thrombocytosis   Elevated brain natriuretic peptide (BNP) level   Elevated troponin   Essential hypertension   Acute kidney injury superimposed on CKD (HCC)   Hyperkalemia  Brief Summary:- 87 y.o. female with medical history significant for atrial fibrillation on Eliquis, hypertension, hypothyroidism, diastolic heart failure, rheumatoid arthritis and iron deficiency anemia  admitted on 04/02/2020 with increasing weight gain and acute on chronic diastolic dysfunction CHF exacerbation despite ardiology service increasing  patient's diuretics as outpatient  A/p 1)HFpEF/Acute on Chronic Diastolic CHF Exacer- -Repeat echo shows that MR is now severe, it was previously moderate mitral regurg -Repeat echo also shows moderate TR -Repeat echo with EF down to 45 to 50% from 50-55 % back in October 2021 -Clinical exam, chest x-ray and BNP consistent with CHF exacerbation - c/n IV Lasix  40 mg every 8 hours -Daily weight and fluid input and output monitoring -Repeat chest x-ray bilateral pleural effusion-- ultrasound thoracentesis requested  2)Acute Hypoxic Resp Failure:- PTA  patient was not on O2 -Oxygen requirement improved patient is down to 2 L of oxygen via nasal cannula from 8 L -Chest x-ray with possible pneumonia--patient without typical pneumonia symptoms, fevers, no leukocytosis and PCT is less than 0.10 -Repeat chest x-ray as above #1 -Hold off on antibiotics   3)AKI----acute kidney injury on CKD stage -3B with hyperkalemia- hyperkalemia resolved with Lokelma -Creatinine was 1.75 on 02/27/2020 -Continues down to 1.36 from a peak of 2.31 04/02/2020 -Monitor renal function closely while diuresing patient renally adjust medications, avoid nephrotoxic agents / dehydration  / hypotension  4) chronic normocytic anemia--- suspect anemia of chronic kidney disease, monitor closely transfuse as clinically indicated -Hemoglobin is 7.9  5)Hypothyroidism-- c/n levothyroxine 112 mcg daily  6) chronic atrial fibrillation--Cardizem initially discontinued due to bradycardia, however due to drop in EF and tachycardia will use metoprolol instead for rate control --EKG with A. fib with slow ventricular response heart rate around 50 -c/n  Eliquis for stroke prophylaxis  7) social/ethics--- patient wants to be a DNI, but she is okay with CPR and ACLS protocols   Disposition/Need for in-Hospital Stay- patient unable to be discharged at this time due to -acute diastolic dysfunction CHF exacerbation requiring IV diuresis and close monitoring of renal function and electrolytes, as well as acute hypoxic respiratory failure requiring oxygen supplementation at this time*  Status is: Inpatient  Remains inpatient appropriate because:Please see above  Disposition: The patient is from: Home              Anticipated d/c is to: Home              Anticipated d/c date is: 2 days              Patient currently is not medically stable to d/c. Barriers: Not Clinically Stable-   Code Status : -  Code Status: Partial Code   Family Communication:    (patient is alert, awake and  coherent)   Consults  :    DVT Prophylaxis  :   - SCDs   Place TED hose Start: 04/02/20 1307 apixaban (ELIQUIS) tablet 2.5 mg Start: 04/02/20 1000 SCDs Start: 04/02/20 0459 apixaban (ELIQUIS) tablet 2.5 mg    Lab Results  Component Value Date   PLT 347 04/04/2020   Inpatient Medications  Scheduled Meds:  apixaban  2.5 mg Oral BID   Chlorhexidine Gluconate Cloth  6 each Topical Daily   furosemide  40 mg Intravenous Q8H   levothyroxine  112 mcg Oral Q0600   metoprolol tartrate  25 mg Oral BID   pantoprazole  40 mg Oral Daily   Continuous Infusions: PRN Meds:.acetaminophen **OR** acetaminophen, traMADol  Anti-infectives (From admission, onward)   None        Objective:   Vitals:   04/03/20 2149 04/04/20 0212 04/04/20 0500 04/04/20 0601  BP: 127/69 116/81  115/61  Pulse: 75 71  (!) 104  Resp: 18 18  18   Temp: 98.3 F (36.8 C) 98.6 F (37 C)  98.4 F (36.9 C)  TempSrc: Oral Oral  Oral  SpO2: 92% 94%  93%  Weight:   67.8 kg   Height:        Wt Readings from Last 3 Encounters:  04/04/20 67.8 kg  03/09/20 66 kg  02/27/20 65 kg    Intake/Output Summary (Last 24 hours) at 04/04/2020 1558 Last data filed at 04/04/2020 1300 Gross per 24 hour  Intake 1200 ml  Output 630 ml  Net 570 ml   Physical Exam  Gen:- Awake Alert,  In no apparent distress  HEENT:- [place].AT, No sclera icterus Nose- [place] 2L/min Neck-Supple Neck,No JVD,.  Lungs-diminished breath sounds, bibasilar rales CV- S1, S2 normal, irregular  Abd-  +ve B.Sounds, Abd Soft, No tenderness,    Extremity/Skin:-1-2+ edema, pedal pulses present Psych-affect is appropriate, oriented x3 Neuro-generalized weakness no new focal deficits, no tremors   Data Review:   Micro Results Recent Results (from the past 240 hour(s))  Resp Panel by RT-PCR (Flu A&B, Covid) Nasopharyngeal Swab     Status: None   Collection Time: 04/02/20  1:44 AM   Specimen: Nasopharyngeal Swab; Nasopharyngeal(NP) swabs in vial  transport medium  Result Value Ref Range Status   SARS Coronavirus 2 by RT PCR NEGATIVE NEGATIVE Final    Comment: (NOTE) SARS-CoV-2 target nucleic acids are NOT DETECTED.  The SARS-CoV-2 RNA is generally detectable in upper respiratory specimens during the acute phase of infection. The lowest concentration of SARS-CoV-2 viral copies this assay can detect is 138 copies/mL. A negative result does not preclude SARS-Cov-2 infection and should not be used as the sole basis for treatment or other patient management decisions. A negative result may occur with  improper specimen collection/handling, submission of specimen other than nasopharyngeal swab, presence of viral mutation(s) within the areas targeted by this assay, and inadequate number of viral copies(<138 copies/mL). A negative result must be combined with clinical  observations, patient history, and epidemiological information. The expected result is Negative.  Fact Sheet for Patients:  EntrepreneurPulse.com.au  Fact Sheet for Healthcare Providers:  IncredibleEmployment.be  This test is no t yet approved or cleared by the [place] FDA and  has been authorized for detection and/or diagnosis of SARS-CoV-2 by FDA under an Emergency Use Authorization (EUA). This EUA will remain  in effect (meaning this test can be used) for the duration of the COVID-19 declaration under Section 564(b)(1) of the Act, 21 U.S.C.section 360bbb-3(b)(1), unless the authorization is terminated  or revoked sooner.       Influenza A by PCR NEGATIVE NEGATIVE Final   Influenza B by PCR NEGATIVE NEGATIVE Final    Comment: (NOTE) The Xpert Xpress SARS-CoV-2/FLU/RSV plus assay is intended as an aid in the diagnosis of influenza from Nasopharyngeal swab specimens and should not be used as a sole basis for treatment. Nasal washings and aspirates are unacceptable for Xpert Xpress SARS-CoV-2/FLU/RSV testing.  Fact  Sheet for Patients: EntrepreneurPulse.com.au  Fact Sheet for Healthcare Providers: IncredibleEmployment.be  This test is not yet approved or cleared by the [place] FDA and has been authorized for detection and/or diagnosis of SARS-CoV-2 by FDA under an Emergency Use Authorization (EUA). This EUA will remain in effect (meaning this test can be used) for the duration of the COVID-19 declaration under Section 564(b)(1) of the Act, 21 U.S.C. section 360bbb-3(b)(1), unless the authorization is terminated or revoked.  Performed at [facility], [address], [place], [place] [address]   MRSA PCR Screening     Status: None   Collection Time: 04/02/20  4:47 AM   Specimen: Nasal Mucosa; Nasopharyngeal  Result Value Ref Range Status   MRSA by PCR NEGATIVE NEGATIVE Final    Comment:        The GeneXpert MRSA Assay (FDA approved for NASAL specimens only), is one component of a comprehensive MRSA colonization surveillance program. It is not intended to diagnose MRSA infection nor to guide or monitor treatment for MRSA infections. Performed at [facility], [address], [place], [place] [address]     Radiology Reports DG Chest 2 View  Result Date: 04/03/2020 CLINICAL DATA:  Shortness of breath for 1 week. EXAM: CHEST - 2 VIEW COMPARISON:  04/02/2020 and prior exams FINDINGS: Enlargement of the cardiopericardial silhouette is noted. Moderate bilateral pleural effusions and bilateral LOWER lung atelectasis/consolidation noted. No pneumothorax or acute bony abnormality. IMPRESSION: Enlargement of the cardiopericardial silhouette with moderate bilateral pleural effusions and bilateral LOWER lung atelectasis/consolidation. Electronically Signed   By: Margarette Canada M.D.   On: 04/03/2020 10:47   DG Chest Port 1 View  Result Date: 04/02/2020 CLINICAL DATA:  Shortness of breath for 4-5 days, worsening, COVID testing pending EXAM: PORTABLE CHEST 1 VIEW  COMPARISON:  Radiograph 12/01/2019, CT 12/01/2019 FINDINGS: Chronically coarsened interstitial and bronchitic features with new patchy consolidative opacity in the right infrahilar lung and retrocardiac space as well as likely layering bilateral effusions. Mild pulmonary vascular congestion is present. Cardiac size is similar to prior though portions are obscured by overlying opacity. The aorta is calcified. The remaining cardiomediastinal contours are unremarkable. No other acute osseous or soft tissue abnormality. Degenerative changes are present in the imaged spine and shoulders. Telemetry leads overlie the chest. IMPRESSION: 1. Patchy consolidative opacity in the right infrahilar lung and retrocardiac space with likely layering bilateral effusions, could reflect pneumonia, asymmetric edema or combination there of. 2. Chronically coarsened interstitial and bronchitic features. 3.  Aortic Atherosclerosis (  ICD10-I70.0). Electronically Signed   By: Lovena Le M.D.   On: 04/02/2020 02:07   MM 3D SCREEN BREAST BILATERAL  Result Date: 03/24/2020 CLINICAL DATA:  Screening. EXAM: DIGITAL SCREENING BILATERAL MAMMOGRAM WITH TOMOSYNTHESIS AND CAD TECHNIQUE: Bilateral screening digital craniocaudal and mediolateral oblique mammograms were obtained. Bilateral screening digital breast tomosynthesis was performed. The images were evaluated with computer-aided detection. COMPARISON:  Previous exam(s). ACR Breast Density Category b: There are scattered areas of fibroglandular density. FINDINGS: There are no findings suspicious for malignancy. The images were evaluated with computer-aided detection. IMPRESSION: No mammographic evidence of malignancy. A result letter of this screening mammogram will be mailed directly to the patient. RECOMMENDATION: Screening mammogram in one year. (Code:SM-B-01Y) BI-RADS CATEGORY  1: Negative. Electronically Signed   By: Lajean Manes M.D.   On: 03/24/2020 14:23   ECHOCARDIOGRAM  LIMITED  Result Date: 04/02/2020    ECHOCARDIOGRAM LIMITED REPORT   Patient Name:   Erin Schmidt Date of Exam: 04/02/2020 Medical Rec #:  [id number]         Height:       66.0 in Accession #:    [id number]        Weight:       153.0 lb Date of Birth:  [birth year]         BSA:          1.785 m Patient Age:    59 years          BP:           95/63 mmHg Patient Gender: F                 HR:           112 bpm. Exam Location:  [facility] Procedure: Limited Echo, Cardiac Doppler and Color Doppler Indications:    CHF-Acute Diastolic  History:        Patient has prior history of Echocardiogram examinations, most                 recent 10/16/2019. CHF, Arrythmias:Atrial Fibrillation,                 Signs/Symptoms:Shortness of Breath; Risk Factors:Hypertension                 and Former Smoker. DOE. Orthopnea. Elevated troponin.  Sonographer:    Clayton Lefort RDCS (AE) Referring Phys: [id number] OLADAPO ADEFESO IMPRESSIONS  1. Hypokinesis of the inferior wall (base, mid). Left ventricular ejection fraction, by estimation, is 45 to 50%. The left ventricle has mildly decreased function. There is mild left ventricular hypertrophy. Left ventricular diastolic parameters are indeterminate.  2. Right ventricular systolic function is low normal. The right ventricular size is mildly enlarged. There is moderately elevated pulmonary artery systolic pressure.  3. Left atrial size was mildly dilated.  4. Right atrial size was moderately dilated.  5. Large pleural effusion.  6. There are a couple MR jets; the largest is eccentric directed posteriorly along posterior wall of LA COmpared to previous report, MR is more severe. . The mitral valve is abnormal. Severe mitral valve regurgitation.  7. Tricuspid valve regurgitation is moderate.  8. The aortic valve is tricuspid. Aortic valve regurgitation is not visualized. Mild to moderate aortic valve sclerosis/calcification is present, without any evidence of aortic stenosis.  9. The inferior vena  cava is dilated in size with <50% respiratory variability, suggesting right atrial pressure of 15 mmHg. FINDINGS  Left Ventricle: Hypokinesis of the inferior wall (  base, mid). Left ventricular ejection fraction, by estimation, is 45 to 50%. The left ventricle has mildly decreased function. The left ventricular internal cavity size was normal in size. There is mild left ventricular hypertrophy. Left ventricular diastolic parameters are indeterminate. Right Ventricle: The right ventricular size is mildly enlarged. Right vetricular wall thickness was not assessed. Right ventricular systolic function is low normal. There is moderately elevated pulmonary artery systolic pressure. The tricuspid regurgitant velocity is 3.15 m/s, and with an assumed right atrial pressure of 15 mmHg, the estimated right ventricular systolic pressure is 28.4 mmHg. Left Atrium: Left atrial size was mildly dilated. Right Atrium: Right atrial size was moderately dilated. Pericardium: Trivial pericardial effusion is present. Mitral Valve: There are a couple MR jets; the largest is eccentric directed posteriorly along posterior wall of LA COmpared to previous report, MR is more severe. The mitral valve is abnormal. There is moderate thickening of the mitral valve leaflet(s). Mild mitral annular calcification. Severe mitral valve regurgitation. Tricuspid Valve: The tricuspid valve is normal in structure. Tricuspid valve regurgitation is moderate. Aortic Valve: The aortic valve is tricuspid. Aortic valve regurgitation is not visualized. Mild to moderate aortic valve sclerosis/calcification is present, without any evidence of aortic stenosis. Pulmonic Valve: The pulmonic valve was not well visualized. Pulmonic valve regurgitation is not visualized. Aorta: The aortic root and ascending aorta are structurally normal, with no evidence of dilitation. Venous: The inferior vena cava is dilated in size with less than 50% respiratory variability, suggesting  right atrial pressure of 15 mmHg. IAS/Shunts: No atrial level shunt detected by color flow Doppler. Additional Comments: There is a large pleural effusion. LEFT VENTRICLE PLAX 2D LVIDd:         4.71 cm LVIDs:         3.62 cm LV PW:         1.29 cm LV IVS:        1.35 cm LVOT diam:     1.80 cm LVOT Area:     2.54 cm  LEFT ATRIUM         Index LA diam:    4.90 cm 2.75 cm/m   AORTA Ao Root diam: 2.80 cm Ao Asc diam:  3.30 cm MR Peak grad:    97.2 mmHg   TRICUSPID VALVE MR Mean grad:    61.0 mmHg   TR Peak grad:   39.7 mmHg MR Vmax:         493.00 cm/s TR Vmax:        315.00 cm/s MR Vmean:        359.0 cm/s MR PISA:         3.08 cm    SHUNTS MR PISA Eff ROA: 24 mm      Systemic Diam: 1.80 cm MR PISA Radius:  0.70 cm Dorris Carnes MD Electronically signed by Dorris Carnes MD Signature Date/Time: 04/02/2020/5:26:49 PM    Final      CBC Recent Labs  Lab 04/02/20 0148 04/03/20 0401 04/04/20 0606  WBC 9.8 8.8 11.8*  HGB 8.0* 7.4* 7.9*  HCT 27.5* 25.3* 26.9*  PLT 463* 347 347  MCV 88.4 86.9 86.5  MCH 25.7* 25.4* 25.4*  MCHC 29.1* 29.2* 29.4*  RDW 19.5* 19.6* 19.4*  LYMPHSABS 1.9  --   --   MONOABS 1.2*  --   --   EOSABS 0.1  --   --   BASOSABS 0.1  --   --     Chemistries  Recent Labs  Lab 04/02/20  0148 04/03/20 0401 04/04/20 0541  NA 135 138   136 136  K 5.7* 2.7*   2.7* 3.6  CL 99 98   98 99  CO2 21* 27   25 26   GLUCOSE 133* 97   97 109*  BUN 36* 32*   33* 28*  CREATININE 2.37* 1.61*   1.60* 1.36*  CALCIUM 8.7* 8.3*   8.2* 8.5*  AST 32 32  --   ALT 20 24  --   ALKPHOS 105 96  --   BILITOT 1.1 1.1  --    ------------------------------------------------------------------------------------------------------------------ No results for input(s): CHOL, HDL, LDLCALC, TRIG, CHOLHDL, LDLDIRECT in the last 72 hours.  No results found for: HGBA1C ------------------------------------------------------------------------------------------------------------------ No results for input(s): TSH,  T4TOTAL, T3FREE, THYROIDAB in the last 72 hours.  Invalid input(s): FREET3 ------------------------------------------------------------------------------------------------------------------ No results for input(s): VITAMINB12, FOLATE, FERRITIN, TIBC, IRON, RETICCTPCT in the last 72 hours.  Coagulation profile Recent Labs  Lab 04/03/20 0401  INR 2.0*    No results for input(s): DDIMER in the last 72 hours.  Cardiac Enzymes No results for input(s): CKMB, TROPONINI, MYOGLOBIN in the last 168 hours.  Invalid input(s): CK ------------------------------------------------------------------------------------------------------------------    Component Value Date/Time   BNP 636.0 (H) 04/02/2020 1610     Roxan Hockey M.D on 04/04/2020 at 3:58 PM  Go to [email or web address] - for contact info  [facility] - Office  [phone]

## 2020-04-05 ENCOUNTER — Inpatient Hospital Stay (HOSPITAL_COMMUNITY): Payer: Medicare Other

## 2020-04-05 ENCOUNTER — Encounter (HOSPITAL_COMMUNITY): Payer: Self-pay | Admitting: Internal Medicine

## 2020-04-05 DIAGNOSIS — I4819 Other persistent atrial fibrillation: Secondary | ICD-10-CM

## 2020-04-05 DIAGNOSIS — I5041 Acute combined systolic (congestive) and diastolic (congestive) heart failure: Secondary | ICD-10-CM

## 2020-04-05 DIAGNOSIS — I5043 Acute on chronic combined systolic (congestive) and diastolic (congestive) heart failure: Secondary | ICD-10-CM

## 2020-04-05 LAB — BODY FLUID CELL COUNT WITH DIFFERENTIAL
Eos, Fluid: 0 %
Lymphs, Fluid: 47 %
Monocyte-Macrophage-Serous Fluid: 24 % — ABNORMAL LOW (ref 50–90)
Neutrophil Count, Fluid: 29 % — ABNORMAL HIGH (ref 0–25)
Other Cells, Fluid: REACTIVE %
Total Nucleated Cell Count, Fluid: 399 cu mm (ref 0–1000)

## 2020-04-05 LAB — RENAL FUNCTION PANEL
Albumin: 3.1 g/dL — ABNORMAL LOW (ref 3.5–5.0)
Anion gap: 11 (ref 5–15)
BUN: 32 mg/dL — ABNORMAL HIGH (ref 8–23)
CO2: 27 mmol/L (ref 22–32)
Calcium: 8.4 mg/dL — ABNORMAL LOW (ref 8.9–10.3)
Chloride: 97 mmol/L — ABNORMAL LOW (ref 98–111)
Creatinine, Ser: 1.45 mg/dL — ABNORMAL HIGH (ref 0.44–1.00)
GFR, Estimated: 35 mL/min — ABNORMAL LOW (ref >60)
Glucose, Bld: 114 mg/dL — ABNORMAL HIGH (ref 70–99)
Phosphorus: 3.6 mg/dL (ref 2.5–4.6)
Potassium: 4.3 mmol/L (ref 3.5–5.1)
Sodium: 135 mmol/L (ref 135–145)

## 2020-04-05 LAB — GRAM STAIN

## 2020-04-05 LAB — GLUCOSE, PLEURAL OR PERITONEAL FLUID: Glucose, Fluid: 109 mg/dL

## 2020-04-05 LAB — PROTEIN, PLEURAL OR PERITONEAL FLUID: Total protein, fluid: <6 g/dL

## 2020-04-05 MED ORDER — FUROSEMIDE 10 MG/ML IJ SOLN
40.0000 mg | Freq: Two times a day (BID) | INTRAMUSCULAR | Status: DC
  Administered 2020-04-06: 40 mg via INTRAVENOUS
  Filled 2020-04-05: qty 4

## 2020-04-05 MED ORDER — METOPROLOL SUCCINATE ER 50 MG PO TB24
50.0000 mg | ORAL_TABLET | Freq: Every day | ORAL | Status: DC
  Administered 2020-04-05 – 2020-04-09 (×4): 50 mg via ORAL
  Filled 2020-04-05 (×5): qty 1

## 2020-04-05 NOTE — Procedures (Signed)
PreOperative Dx: BILATERAL pleural effusion Postoperative Dx: BILATERAL pleural effusion Procedure:   US guided RIGHT thoracentesis Radiologist:  Thornton Papas Anesthesia:  10 ml of 1% lidocaine Specimen:  1.03 L of yellow colored fluid EBL:   < 1 ml Complications: None

## 2020-04-05 NOTE — Sedation Documentation (Signed)
PT tolerated right sided thoracentesis procedure well today and 1.03 Liters of clear yellow fluid removed with labs collected and sent for processing. PT verbalized understanding of post procedure instructions and pt taken via wheelchair to xray department by transporter at this time. Vital signs remained stable and NAD noted.

## 2020-04-05 NOTE — Progress Notes (Addendum)
Patient Demographics:    Erin Schmidt, is a 87 y.o. female, DOB - [birth year], PRF:[id number]  Admit date - 04/02/2020   Admitting Physician Bernadette Hoit, DO  Outpatient Primary MD for the patient is Sharilyn Sites, MD  LOS - 3   Chief Complaint  Patient presents with  . Abdominal Pain  . Shortness of Breath        Subjective:    Erin Schmidt today has no fevers, no emesis,  No chest pain,  -Dyspnea improving post right-sided thoracentesis -No cough -Voiding okay  Assessment  & Plan :    Principal Problem:   Acute exacerbation of CHF (congestive heart failure) (HCC) Active Problems:   Normocytic anemia   Hypothyroidism   Acute respiratory failure with hypoxia (HCC)   Thrombocytosis   Elevated brain natriuretic peptide (BNP) level   Elevated troponin   Essential hypertension   Acute kidney injury superimposed on CKD (HCC)   Hyperkalemia  Brief Summary:- 87 y.o. female with medical history significant for atrial fibrillation on Eliquis, hypertension, hypothyroidism, diastolic heart failure, rheumatoid arthritis and iron deficiency anemia  admitted on 04/02/2020 with increasing weight gain and acute on chronic diastolic dysfunction CHF exacerbation despite cardiology service increasing  patient's diuretics as outpatient  A/p 1)Acute on chronic combined systolic and diastolic CHF/H/o dCHF, now with New Systolic CHF---- -Repeat echo shows that MR is now severe, it was previously moderate mitral regurg -Repeat echo also shows moderate TR -Repeat echo with EF down to 45 to 50% from 50-55 % back in October 2021 -Clinical exam, chest x-ray and BNP consistent with CHF exacerbation -Daily weight and fluid input and output monitoring -Repeat chest x-ray bilateral pleural effusion-- s/p Rt ultrasound thoracentesis on 04/05/20 --Creatinine trending up so change Lasix to 40 mg every 12 hrs from   -every 8 hours  2)Acute Hypoxic Resp Failure:- PTA patient was not on O2 -Oxygen requirement improved patient is down to 2 L of oxygen via nasal cannula from 8 L -Chest x-ray with possible pneumonia--patient without typical pneumonia symptoms, fevers, no leukocytosis and PCT is less than 0.10 -Repeat chest x-ray as above #1 -Hold off on antibiotics  -Hypoxia improving with diuresis and right-sided thoracentesis  3)AKI----acute kidney injury on CKD stage -3B with hyperkalemia- hyperkalemia resolved with Lokelma -Creatinine was 1.75 on 02/27/2020 - Creatinine peak of 2.31 04/02/2020 -Creatinine now up to 1.45 -Monitor renal function closely while diuresing patient renally adjust medications, avoid nephrotoxic agents / dehydration  / hypotension -Lasix adjusted as above in #1  4) chronic normocytic anemia--- suspect anemia of chronic kidney disease, monitor closely transfuse as clinically indicated -Hemoglobin is 7.9--consider transfusion  5)Hypothyroidism-- c/n levothyroxine 112 mcg daily  6) chronic atrial fibrillation--Cardizem initially discontinued due to bradycardia, however due to drop in EF and tachycardia will use metoprolol instead for rate control -failed to maintain NSR on Amiodarone and 2 DCCV's. Amiodarone stopped by Dr. Lovena Le earlier this month with plans for 3 day Zio in 3 months to make sure rate controlled -c/n  Eliquis for stroke prophylaxis  7)Social/Ethics--- patient wants to be a DNI, but she is okay with CPR and ACLS protocols   Disposition/Need for in-Hospital Stay- patient unable to be discharged at this time due to -acute diastolic  dysfunction CHF exacerbation requiring IV diuresis and close monitoring of renal function and electrolytes, as well as acute hypoxic respiratory failure requiring oxygen supplementation at this time*  Status is: Inpatient  Remains inpatient appropriate because:Please see above   Disposition: The patient is from: Home               Anticipated d/c is to: Home              Anticipated d/c date is: 2 days              Patient currently is not medically stable to d/c. Barriers: Not Clinically Stable-   Procedures:- 04/05/20-right-sided thoracentesis removing 1 L  Code Status : -  Code Status: Partial Code   Family Communication:    (patient is alert, awake and coherent)   Consults  :    DVT Prophylaxis  :   - SCDs   Place TED hose Start: 04/02/20 1307 apixaban (ELIQUIS) tablet 2.5 mg Start: 04/02/20 1000 SCDs Start: 04/02/20 0459 apixaban (ELIQUIS) tablet 2.5 mg    Lab Results  Component Value Date   PLT 347 04/04/2020   Inpatient Medications  Scheduled Meds: . apixaban  2.5 mg Oral BID  . Chlorhexidine Gluconate Cloth  6 each Topical Daily  . [START ON 04/06/2020] furosemide  40 mg Intravenous BID  . levothyroxine  112 mcg Oral Q0600  . metoprolol succinate  50 mg Oral Daily  . pantoprazole  40 mg Oral Daily   Continuous Infusions: PRN Meds:.acetaminophen **OR** acetaminophen, methocarbamol, traMADol  Anti-infectives (From admission, onward)   None        Objective:   Vitals:   04/05/20 0532 04/05/20 1215 04/05/20 1231 04/05/20 1304  BP: 99/60 110/71 100/66 98/72  Pulse: 92 (!) 104 (!) 108 97  Resp: 18 18 18 20   Temp: (!) 97.5 F (36.4 C) 97.6 F (36.4 C)  97.7 F (36.5 C)  TempSrc: Oral Oral  Oral  SpO2: 99% 95% 93% 95%  Weight:      Height:        Wt Readings from Last 3 Encounters:  04/04/20 67.8 kg  03/09/20 66 kg  02/27/20 65 kg    Intake/Output Summary (Last 24 hours) at 04/05/2020 1421 Last data filed at 04/05/2020 1300 Gross per 24 hour  Intake 720 ml  Output 300 ml  Net 420 ml   Physical Exam  Gen:- Awake Alert,  In no apparent distress  HEENT:- [place].AT, No sclera icterus Nose- Frost 2L/min Neck-Supple Neck, ,.  Lungs-improved air movement on the right, bibasilar rales CV- S1, S2 normal, irregularly irregular Abd-  +ve B.Sounds, Abd Soft, No tenderness,     Extremity/Skin:-Improving lower extremity pitting edema, pedal pulses present Psych-affect is appropriate, oriented x3 Neuro-generalized weakness no new focal deficits, no tremors   Data Review:   Micro Results Recent Results (from the past 240 hour(s))  Resp Panel by RT-PCR (Flu A&B, Covid) Nasopharyngeal Swab     Status: None   Collection Time: 04/02/20  1:44 AM   Specimen: Nasopharyngeal Swab; Nasopharyngeal(NP) swabs in vial transport medium  Result Value Ref Range Status   SARS Coronavirus 2 by RT PCR NEGATIVE NEGATIVE Final    Comment: (NOTE) SARS-CoV-2 target nucleic acids are NOT DETECTED.  The SARS-CoV-2 RNA is generally detectable in upper respiratory specimens during the acute phase of infection. The lowest concentration of SARS-CoV-2 viral copies this assay can detect is 138 copies/mL. A negative result does not preclude SARS-Cov-2 infection and  should not be used as the sole basis for treatment or other patient management decisions. A negative result may occur with  improper specimen collection/handling, submission of specimen other than nasopharyngeal swab, presence of viral mutation(s) within the areas targeted by this assay, and inadequate number of viral copies(<138 copies/mL). A negative result must be combined with clinical observations, patient history, and epidemiological information. The expected result is Negative.  Fact Sheet for Patients:  EntrepreneurPulse.com.au  Fact Sheet for Healthcare Providers:  IncredibleEmployment.be  This test is no t yet approved or cleared by the [place] FDA and  has been authorized for detection and/or diagnosis of SARS-CoV-2 by FDA under an Emergency Use Authorization (EUA). This EUA will remain  in effect (meaning this test can be used) for the duration of the COVID-19 declaration under Section 564(b)(1) of the Act, 21 U.S.C.section 360bbb-3(b)(1), unless the authorization is  terminated  or revoked sooner.       Influenza A by PCR NEGATIVE NEGATIVE Final   Influenza B by PCR NEGATIVE NEGATIVE Final    Comment: (NOTE) The Xpert Xpress SARS-CoV-2/FLU/RSV plus assay is intended as an aid in the diagnosis of influenza from Nasopharyngeal swab specimens and should not be used as a sole basis for treatment. Nasal washings and aspirates are unacceptable for Xpert Xpress SARS-CoV-2/FLU/RSV testing.  Fact Sheet for Patients: EntrepreneurPulse.com.au  Fact Sheet for Healthcare Providers: IncredibleEmployment.be  This test is not yet approved or cleared by the [place] FDA and has been authorized for detection and/or diagnosis of SARS-CoV-2 by FDA under an Emergency Use Authorization (EUA). This EUA will remain in effect (meaning this test can be used) for the duration of the COVID-19 declaration under Section 564(b)(1) of the Act, 21 U.S.C. section 360bbb-3(b)(1), unless the authorization is terminated or revoked.  Performed at [facility], [address] [place], [place] [address]   MRSA PCR Screening     Status: None   Collection Time: 04/02/20  4:47 AM   Specimen: Nasal Mucosa; Nasopharyngeal  Result Value Ref Range Status   MRSA by PCR NEGATIVE NEGATIVE Final    Comment:        The GeneXpert MRSA Assay (FDA approved for NASAL specimens only), is one component of a comprehensive MRSA colonization surveillance program. It is not intended to diagnose MRSA infection nor to guide or monitor treatment for MRSA infections. Performed at [facility], [address] [place], [place] [address]   Culture, body fluid w Gram Stain-bottle     Status: None (Preliminary result)   Collection Time: 04/05/20 12:17 PM   Specimen: Pleura  Result Value Ref Range Status   Specimen Description PLEURAL RIGHT LUNG  Final   Special Requests   Final    BOTTLES DRAWN AEROBIC AND ANAEROBIC Blood Culture adequate volume Performed  at [facility], [address], [place], [place] [address]    Culture PENDING  Incomplete   Report Status PENDING  Incomplete  Gram stain     Status: None   Collection Time: 04/05/20 12:17 PM   Specimen: Pleura  Result Value Ref Range Status   Specimen Description PLEURAL  Final   Special Requests NONE  Final   Gram Stain   Final    NO ORGANISMS SEEN FEW WBC SEEN WBC PRESENT, PREDOMINANTLY MONONUCLEAR Performed at [facility], [address] [place], [place] [address]    Report Status 04/05/2020 FINAL  Final    Radiology Reports DG Chest 2 View  Result Date: 04/05/2020 CLINICAL DATA:  BILATERAL pleural effusions, post RIGHT thoracentesis EXAM: CHEST - 2 VIEW COMPARISON:  04/03/2020 FINDINGS: PA view performed with expiratory technique due to preceding thoracentesis. Enlargement of cardiac silhouette with pulmonary vascular congestion. Atherosclerotic calcification aorta. Perihilar infiltrates consistent with pulmonary edema. Minimal residual RIGHT pleural effusion and basilar atelectasis post thoracentesis. No pneumothorax. Moderate LEFT pleural effusion, slightly increased from previous exam. No acute osseous findings. IMPRESSION: Enlargement of cardiac silhouette with pulmonary vascular congestion and mild pulmonary edema. BILATERAL pleural effusions and basilar atelectasis, slightly increased on LEFT as well as decreased on RIGHT post RIGHT thoracentesis. No pneumothorax following RIGHT thoracentesis. Electronically Signed   By: Lavonia Dana M.D.   On: 04/05/2020 13:07   DG Chest 2 View  Result Date: 04/03/2020 CLINICAL DATA:  Shortness of breath for 1 week. EXAM: CHEST - 2 VIEW COMPARISON:  04/02/2020 and prior exams FINDINGS: Enlargement of the cardiopericardial silhouette is noted. Moderate bilateral pleural effusions and bilateral LOWER lung atelectasis/consolidation noted. No pneumothorax or acute bony abnormality. IMPRESSION: Enlargement of the cardiopericardial silhouette with  moderate bilateral pleural effusions and bilateral LOWER lung atelectasis/consolidation. Electronically Signed   By: Margarette Canada M.D.   On: 04/03/2020 10:47   DG Chest Port 1 View  Result Date: 04/02/2020 CLINICAL DATA:  Shortness of breath for 4-5 days, worsening, COVID testing pending EXAM: PORTABLE CHEST 1 VIEW COMPARISON:  Radiograph 12/01/2019, CT 12/01/2019 FINDINGS: Chronically coarsened interstitial and bronchitic features with new patchy consolidative opacity in the right infrahilar lung and retrocardiac space as well as likely layering bilateral effusions. Mild pulmonary vascular congestion is present. Cardiac size is similar to prior though portions are obscured by overlying opacity. The aorta is calcified. The remaining cardiomediastinal contours are unremarkable. No other acute osseous or soft tissue abnormality. Degenerative changes are present in the imaged spine and shoulders. Telemetry leads overlie the chest. IMPRESSION: 1. Patchy consolidative opacity in the right infrahilar lung and retrocardiac space with likely layering bilateral effusions, could reflect pneumonia, asymmetric edema or combination there of. 2. Chronically coarsened interstitial and bronchitic features. 3.  Aortic Atherosclerosis (ICD10-I70.0). Electronically Signed   By: Lovena Le M.D.   On: 04/02/2020 02:07   MM 3D SCREEN BREAST BILATERAL  Result Date: 03/24/2020 CLINICAL DATA:  Screening. EXAM: DIGITAL SCREENING BILATERAL MAMMOGRAM WITH TOMOSYNTHESIS AND CAD TECHNIQUE: Bilateral screening digital craniocaudal and mediolateral oblique mammograms were obtained. Bilateral screening digital breast tomosynthesis was performed. The images were evaluated with computer-aided detection. COMPARISON:  Previous exam(s). ACR Breast Density Category b: There are scattered areas of fibroglandular density. FINDINGS: There are no findings suspicious for malignancy. The images were evaluated with computer-aided detection. IMPRESSION:  No mammographic evidence of malignancy. A result letter of this screening mammogram will be mailed directly to the patient. RECOMMENDATION: Screening mammogram in one year. (Code:SM-B-01Y) BI-RADS CATEGORY  1: Negative. Electronically Signed   By: Lajean Manes M.D.   On: 03/24/2020 14:23   ECHOCARDIOGRAM LIMITED  Result Date: 04/02/2020    ECHOCARDIOGRAM LIMITED REPORT   Patient Name:   VERNESSA LIKES Date of Exam: 04/02/2020 Medical Rec #:  [id number]         Height:       66.0 in Accession #:    [id number]        Weight:       153.0 lb Date of Birth:  02-07-1932         BSA:          1.785 m Patient Age:    45 years  BP:           95/63 mmHg Patient Gender: F                 HR:           112 bpm. Exam Location:  [facility] Procedure: Limited Echo, Cardiac Doppler and Color Doppler Indications:    CHF-Acute Diastolic  History:        Patient has prior history of Echocardiogram examinations, most                 recent 10/16/2019. CHF, Arrythmias:Atrial Fibrillation,                 Signs/Symptoms:Shortness of Breath; Risk Factors:Hypertension                 and Former Smoker. DOE. Orthopnea. Elevated troponin.  Sonographer:    Clayton Lefort RDCS (AE) Referring Phys: [id number] OLADAPO ADEFESO IMPRESSIONS  1. Hypokinesis of the inferior wall (base, mid). Left ventricular ejection fraction, by estimation, is 45 to 50%. The left ventricle has mildly decreased function. There is mild left ventricular hypertrophy. Left ventricular diastolic parameters are indeterminate.  2. Right ventricular systolic function is low normal. The right ventricular size is mildly enlarged. There is moderately elevated pulmonary artery systolic pressure.  3. Left atrial size was mildly dilated.  4. Right atrial size was moderately dilated.  5. Large pleural effusion.  6. There are a couple MR jets; the largest is eccentric directed posteriorly along posterior wall of LA COmpared to previous report, MR is more severe. . The mitral  valve is abnormal. Severe mitral valve regurgitation.  7. Tricuspid valve regurgitation is moderate.  8. The aortic valve is tricuspid. Aortic valve regurgitation is not visualized. Mild to moderate aortic valve sclerosis/calcification is present, without any evidence of aortic stenosis.  9. The inferior vena cava is dilated in size with <50% respiratory variability, suggesting right atrial pressure of 15 mmHg. FINDINGS  Left Ventricle: Hypokinesis of the inferior wall (base, mid). Left ventricular ejection fraction, by estimation, is 45 to 50%. The left ventricle has mildly decreased function. The left ventricular internal cavity size was normal in size. There is mild left ventricular hypertrophy. Left ventricular diastolic parameters are indeterminate. Right Ventricle: The right ventricular size is mildly enlarged. Right vetricular wall thickness was not assessed. Right ventricular systolic function is low normal. There is moderately elevated pulmonary artery systolic pressure. The tricuspid regurgitant velocity is 3.15 m/s, and with an assumed right atrial pressure of 15 mmHg, the estimated right ventricular systolic pressure is 18.5 mmHg. Left Atrium: Left atrial size was mildly dilated. Right Atrium: Right atrial size was moderately dilated. Pericardium: Trivial pericardial effusion is present. Mitral Valve: There are a couple MR jets; the largest is eccentric directed posteriorly along posterior wall of LA COmpared to previous report, MR is more severe. The mitral valve is abnormal. There is moderate thickening of the mitral valve leaflet(s). Mild mitral annular calcification. Severe mitral valve regurgitation. Tricuspid Valve: The tricuspid valve is normal in structure. Tricuspid valve regurgitation is moderate. Aortic Valve: The aortic valve is tricuspid. Aortic valve regurgitation is not visualized. Mild to moderate aortic valve sclerosis/calcification is present, without any evidence of aortic stenosis.  Pulmonic Valve: The pulmonic valve was not well visualized. Pulmonic valve regurgitation is not visualized. Aorta: The aortic root and ascending aorta are structurally normal, with no evidence of dilitation. Venous: The inferior vena cava is dilated in size with less than 50%  respiratory variability, suggesting right atrial pressure of 15 mmHg. IAS/Shunts: No atrial level shunt detected by color flow Doppler. Additional Comments: There is a large pleural effusion. LEFT VENTRICLE PLAX 2D LVIDd:         4.71 cm LVIDs:         3.62 cm LV PW:         1.29 cm LV IVS:        1.35 cm LVOT diam:     1.80 cm LVOT Area:     2.54 cm  LEFT ATRIUM         Index LA diam:    4.90 cm 2.75 cm/m   AORTA Ao Root diam: 2.80 cm Ao Asc diam:  3.30 cm MR Peak grad:    97.2 mmHg   TRICUSPID VALVE MR Mean grad:    61.0 mmHg   TR Peak grad:   39.7 mmHg MR Vmax:         493.00 cm/s TR Vmax:        315.00 cm/s MR Vmean:        359.0 cm/s MR PISA:         3.08 cm    SHUNTS MR PISA Eff ROA: 24 mm      Systemic Diam: 1.80 cm MR PISA Radius:  0.70 cm Dorris Carnes MD Electronically signed by Dorris Carnes MD Signature Date/Time: 04/02/2020/5:26:49 PM    Final    US THORACENTESIS ASP PLEURAL SPACE W/IMG GUIDE  Result Date: 04/05/2020 INDICATION: BILATERAL pleural effusions, shortness of breath, orthopnea EXAM: ULTRASOUND GUIDED DIAGNOSTIC AND THERAPEUTIC RIGHT THORACENTESIS MEDICATIONS: None. COMPLICATIONS: None immediate. PROCEDURE: An ultrasound guided thoracentesis was thoroughly discussed with the patient and questions answered. The benefits, risks, alternatives and complications were also discussed. The patient understands and wishes to proceed with the procedure. Written consent was obtained. Ultrasound was performed to localize and mark an adequate pocket of fluid in the RIGHT chest. The area was then prepped and draped in the normal sterile fashion. 1% Lidocaine was used for local anesthesia. Under ultrasound guidance a 8 French  thoracentesis catheter was introduced. Thoracentesis was performed. The catheter was removed and a dressing applied. FINDINGS: A total of approximately 1.03 L of yellow RIGHT pleural fluid was removed. Samples were sent to the laboratory as requested by the clinical team. IMPRESSION: Successful ultrasound guided RIGHT thoracentesis yielding 1.03 L of pleural fluid. Electronically Signed   By: Lavonia Dana M.D.   On: 04/05/2020 13:06     CBC Recent Labs  Lab 04/02/20 0148 04/03/20 0401 04/04/20 0606  WBC 9.8 8.8 11.8*  HGB 8.0* 7.4* 7.9*  HCT 27.5* 25.3* 26.9*  PLT 463* 347 347  MCV 88.4 86.9 86.5  MCH 25.7* 25.4* 25.4*  MCHC 29.1* 29.2* 29.4*  RDW 19.5* 19.6* 19.4*  LYMPHSABS 1.9  --   --   MONOABS 1.2*  --   --   EOSABS 0.1  --   --   BASOSABS 0.1  --   --     Chemistries  Recent Labs  Lab 04/02/20 0148 04/03/20 0401 04/04/20 0541 04/05/20 0438  NA 135 138  136 136 135  K 5.7* 2.7*  2.7* 3.6 4.3  CL 99 98  98 99 97*  CO2 21* 27  25 26 27   GLUCOSE 133* 97  97 109* 114*  BUN 36* 32*  33* 28* 32*  CREATININE 2.37* 1.61*  1.60* 1.36* 1.45*  CALCIUM 8.7* 8.3*  8.2* 8.5* 8.4*  AST 32  32  --   --   ALT 20 24  --   --   ALKPHOS 105 96  --   --   BILITOT 1.1 1.1  --   --    ------------------------------------------------------------------------------------------------------------------ No results for input(s): CHOL, HDL, LDLCALC, TRIG, CHOLHDL, LDLDIRECT in the last 72 hours.  No results found for: HGBA1C ------------------------------------------------------------------------------------------------------------------ No results for input(s): TSH, T4TOTAL, T3FREE, THYROIDAB in the last 72 hours.  Invalid input(s): FREET3 ------------------------------------------------------------------------------------------------------------------ No results for input(s): VITAMINB12, FOLATE, FERRITIN, TIBC, IRON, RETICCTPCT in the last 72 hours.  Coagulation profile Recent  Labs  Lab 04/03/20 0401  INR 2.0*    No results for input(s): DDIMER in the last 72 hours.  Cardiac Enzymes No results for input(s): CKMB, TROPONINI, MYOGLOBIN in the last 168 hours.  Invalid input(s): CK ------------------------------------------------------------------------------------------------------------------    Component Value Date/Time   BNP 636.0 (H) 04/02/2020 1610    Roxan Hockey M.D on 04/05/2020 at 2:21 PM  Go to [email or web address] - for contact info  [facility] - Office  [phone]

## 2020-04-05 NOTE — Consult Note (Addendum)
Cardiology Consultation:   Patient ID: Erin Schmidt MRN: [id number]; DOB: [birth year]  Admit date: 04/02/2020 Date of Consult: 04/05/2020  PCP:  Sharilyn Sites, MD   [place]  Cardiologist:  Carlyle Dolly, MD   Electrophysiologist:  None   :[id number]}    Patient Profile:   Erin Schmidt is an 87 y.o. female with a history of persistent atrial fibrillation who is being seen today for the evaluation of cardiomyopathy with worsening mitral regurgitation and acute systolic heart failure at the request of Dr. Joesph Fillers.  History of Present Illness:   Erin Schmidt is an 87 yo female with history of persistent atrial fibrillation-failed to maintain NSR after 2 DCCV, Amiodarone stopped earlier this month by Dr. Lovena Le with plans to wear a 3 day Zio in 3 months to make sure she is rate controlled. Also has HTN, mod MR, diastolic CHF maintained on torsemide. Echo 10/16/19 normal LVEF 50-55% mod MR.  Patient admitted 04/02/20  with worsening shortness of breath and suspicion for acute on chronic diastolic, requiring 8L but now improved with diuresis.  Repeat echo LVEF down 45-50% and mitral regurgitation now graded as severe, AKI on CKD.  Heart rate generally in the 90s to low 100s.  Cardizem was discontinued by primary team given concerns about cardiomyopathy.  She did have a slow heart rate of 50 by presenting ECG in atrial fibrillation on March 25. Patient denies excessive salt intake. Was also having abdominal discomfort and mucous in her stools but no blood. Hgb 10.5 in Jan now 7.9.   Past Medical History:  Diagnosis Date  . Arthritis   . Atrial fibrillation ([place])   . Essential hypertension   . GERD (gastroesophageal reflux disease)   . Hyperlipemia   . Hypothyroidism   . Iron deficiency anemia 05/07/2019  . Migraine   . Mitral regurgitation   . Vertigo     Past Surgical History:  Procedure Laterality Date  . APPENDECTOMY    . BREAST BIOPSY Right     benign  . BREAST EXCISIONAL BIOPSY Left    Benign  . BREAST EXCISIONAL BIOPSY Left    Benign  . BREAST LUMPECTOMY  30 yrs ago   Dr. Milbert Coulter- rt breast  . BREAST LUMPECTOMY  15 yrs ago   left- Dr. Romona Curls  . BREAST LUMPECTOMY  01/2011  . CARDIOVERSION N/A 11/10/2019   Procedure: CARDIOVERSION;  Surgeon: Arnoldo Lenis, MD;  Location: [facility];  Service: Endoscopy;  Laterality: N/A;  . CARDIOVERSION N/A 02/19/2020   Procedure: CARDIOVERSION;  Surgeon: Arnoldo Lenis, MD;  Location: [facility];  Service: Endoscopy;  Laterality: N/A;  . CHOLECYSTECTOMY    . COLONOSCOPY    . COLONOSCOPY  04/13/2011   Procedure: COLONOSCOPY;  Surgeon: Rogene Houston, MD;  Location: [facility];  Service: Endoscopy;  Laterality: N/A;  930  . MASTECTOMY, PARTIAL  01/13/2011   Procedure: MASTECTOMY PARTIAL;  Surgeon: Adin Hector, MD;  Location: [place];  Service: General;  Laterality: Left;  left partial mastectomy with needle locallization  . VAGINAL HYSTERECTOMY     2 partials     Home Medications:  Prior to Admission medications   Medication Sig Start Date End Date Taking? Authorizing Provider  Abatacept (ORENCIA CLICKJECT) 856 MG/ML SOAJ Inject 125 mg into the skin every 7 (seven) days. 03/08/20  Yes Ofilia Neas, PA-C  Calcium Carb-Cholecalciferol (CALCIUM 600 + D PO) Take 1 tablet by mouth 2 (two) times daily.  Yes [provider]  cetirizine (ZYRTEC) 10 MG tablet Take 10 mg by mouth daily.   Yes [provider]  cholecalciferol (VITAMIN D3) 25 MCG (1000 UNIT) tablet Take 1,000 Units by mouth daily.   Yes [provider]  diltiazem (CARDIZEM CD) 240 MG 24 hr capsule Take 1 capsule (240 mg total) by mouth daily. 12/03/19  Yes Barton Dubois, MD  ELIQUIS 5 MG TABS tablet TAKE 1 TABLET BY MOUTH TWICE A DAY Patient taking differently: Take 5 mg by mouth 2 (two) times daily. 02/03/20  Yes BranchAlphonse Guild, MD  escitalopram (LEXAPRO) 10 MG tablet Take 10 mg by  mouth daily.   Yes [provider]  ketotifen (ZADITOR) 0.025 % ophthalmic solution Place 1 drop into both eyes 2 (two) times daily as needed (allergies).   Yes [provider]  levothyroxine (SYNTHROID, LEVOTHROID) 112 MCG tablet Take 112 mcg by mouth daily before breakfast.  05/02/16  Yes [provider]  meclizine (ANTIVERT) 25 MG tablet Take 25 mg by mouth 3 (three) times daily as needed for dizziness.   Yes [provider]  Melatonin 10 MG CAPS Take 10 mg by mouth at bedtime.   Yes [provider]  omeprazole (PRILOSEC) 20 MG capsule Take 20 mg by mouth every Monday, Wednesday, and Friday.   Yes [provider]  potassium chloride SA (KLOR-CON M20) 20 MEQ tablet Take 2 tablets (40 mEq total) by mouth 2 (two) times daily. 02/27/20 05/27/20 Yes Strader, Fransisco Hertz, PA-C  torsemide (DEMADEX) 20 MG tablet Take 3 tablets (60 mg total) by mouth 2 (two) times daily. 03/31/20 06/29/20 Yes Strader, Fransisco Hertz, PA-C  traMADol-acetaminophen (ULTRACET) 37.5-325 MG tablet Take 1 tablet by mouth in the morning and at bedtime.    Yes [provider]    Inpatient Medications: Scheduled Meds: . apixaban  2.5 mg Oral BID  . Chlorhexidine Gluconate Cloth  6 each Topical Daily  . furosemide  40 mg Intravenous Q8H  . levothyroxine  112 mcg Oral Q0600  . metoprolol succinate  50 mg Oral Daily  . pantoprazole  40 mg Oral Daily    PRN Meds: acetaminophen **OR** acetaminophen, methocarbamol, traMADol  Allergies:    Allergies  Allergen Reactions  . Feraheme [Ferumoxytol]     Reacted during infusion requiring medical intervention. Increased BP    Social History:   Social History   Tobacco Use  . Smoking status: Former Smoker    Packs/day: 1.00    Years: 20.00    Pack years: 20.00    Types: Cigarettes    Quit date: 01/05/1989    Years since quitting: 31.2  . Smokeless tobacco: Never Used  Substance Use Topics  . Alcohol use: No     Family History:     Family History  Problem Relation Age of Onset  . Heart disease Mother   . Stroke Sister   . Heart disease Brother   . Atrial fibrillation Sister   . Coronary artery disease Brother   . Lung cancer Son   . Healthy Son   . Colon cancer Neg Hx      ROS:  Please see the history of present illness.  Review of Systems  Constitutional: Negative.  HENT: Negative.   Eyes: Negative.   Cardiovascular: Positive for dyspnea on exertion, leg swelling and orthopnea.  Respiratory: Positive for shortness of breath.   Hematologic/Lymphatic: Negative.   Musculoskeletal: Negative.  Negative for joint pain.  Gastrointestinal: Positive for change in  bowel habit.  Genitourinary: Negative.   Neurological: Negative.     All other ROS reviewed and negative.     Physical Exam/Data:   Vitals:   04/04/20 0601 04/04/20 1703 04/04/20 2048 04/05/20 0532  BP: 115/61 107/77 99/67 99/60   Pulse: (!) 104 95 66 92  Resp: 18 18 18 18   Temp: 98.4 F (36.9 C) 97.8 F (36.6 C) 97.8 F (36.6 C) (!) 97.5 F (36.4 C)  TempSrc: Oral Oral Oral Oral  SpO2: 93% 94% 94% 99%  Weight:      Height:        Intake/Output Summary (Last 24 hours) at 04/05/2020 1002 Last data filed at 04/05/2020 0900 Gross per 24 hour  Intake 720 ml  Output 900 ml  Net -180 ml   Last 3 Weights 04/04/2020 04/03/2020 04/02/2020  Weight (lbs) 149 lb 8 oz 152 lb 1.9 oz 153 lb  Weight (kg) 67.813 kg 69 kg 69.4 kg     Body mass index is 24.13 kg/m.  General: Elderly woman, in no acute distress HEENT: normal Lymph: no adenopathy Neck: no JVD Endocrine:  No thryomegaly Vascular: No carotid bruits; FA pulses 2+ bilaterally without bruits  Cardiac: Irregularly irregular;  2/6 systolic murmur LSB and apex Lungs: rales/crackles bilateral bases  Abd: soft, nontender, no hepatomegaly  Ext: trace edema Musculoskeletal:  No deformities, BUE and BLE strength normal and equal Skin: warm and dry  Neuro:  CNs 2-12  intact, no focal abnormalities noted Psych:  Normal affect   EKG:  The EKG was personally reviewed and demonstrates:  Afib at 50/m RBBB Telemetry:  Telemetry was personally reviewed and demonstrates:  Afib 100-105/m with 14 beat WCT  Relevant CV Studies: Echo 04/02/20 IMPRESSIONS    1. Hypokinesis of the inferior wall (base, mid). Left ventricular  ejection fraction, by estimation, is 45 to 50%. The left ventricle has  mildly decreased function. There is mild left ventricular hypertrophy.  Left ventricular diastolic parameters are  indeterminate.   2. Right ventricular systolic function is low normal. The right  ventricular size is mildly enlarged. There is moderately elevated  pulmonary artery systolic pressure.   3. Left atrial size was mildly dilated.   4. Right atrial size was moderately dilated.   5. Large pleural effusion.   6. There are a couple MR jets; the largest is eccentric directed  posteriorly along posterior wall of LA COmpared to previous report, MR is  more severe. . The mitral valve is abnormal. Severe mitral valve  regurgitation.   7. Tricuspid valve regurgitation is moderate.   8. The aortic valve is tricuspid. Aortic valve regurgitation is not  visualized. Mild to moderate aortic valve sclerosis/calcification is  present, without any evidence of aortic stenosis.   9. The inferior vena cava is dilated in size with <50% respiratory  variability, suggesting right atrial pressure of 15 mmHg.   Echocardiogram: 10/16/2019 IMPRESSIONS    1. Left ventricular ejection fraction, by estimation, is 50 to 55%. The  left ventricle has normal function. The left ventricle has no regional  wall motion abnormalities. Left ventricular diastolic parameters are  indeterminate.   2. Right ventricular systolic function is normal. The right ventricular  size is normal. There is mildly elevated pulmonary artery systolic  pressure.   3. Left atrial size was severely dilated.   4.  Right atrial size was moderately dilated.   5. The mitral valve is normal in structure. Moderate mitral valve  regurgitation. No evidence of mitral stenosis.  6. Tricuspid valve regurgitation is mild to moderate.   7. The aortic valve is tricuspid. Aortic valve regurgitation is not  visualized. No aortic stenosis is present.   8. The inferior vena cava is dilated in size with <50% respiratory  variability, suggesting right atrial pressure of 15 mmHg.    Laboratory Data:  High Sensitivity Troponin:   Recent Labs  Lab 04/02/20 0148 04/02/20 0348 04/02/20 0529 04/02/20 0755  TROPONINIHS 20* 21* 19* 17     Chemistry Recent Labs  Lab 04/03/20 0401 04/04/20 0541 04/05/20 0438  NA 138  136 136 135  K 2.7*  2.7* 3.6 4.3  CL 98  98 99 97*  CO2 27  25 26 27   GLUCOSE 97  97 109* 114*  BUN 32*  33* 28* 32*  CREATININE 1.61*  1.60* 1.36* 1.45*  CALCIUM 8.3*  8.2* 8.5* 8.4*  GFRNONAA 31*  31* 38* 35*  ANIONGAP 13  13 11 11     Recent Labs  Lab 04/02/20 0148 04/03/20 0401 04/04/20 0541 04/05/20 0438  PROT 7.1 6.4*  --   --   ALBUMIN 3.9 3.5  3.5 3.5 3.1*  AST 32 32  --   --   ALT 20 24  --   --   ALKPHOS 105 96  --   --   BILITOT 1.1 1.1  --   --    Hematology Recent Labs  Lab 04/02/20 0148 04/03/20 0401 04/04/20 0606  WBC 9.8 8.8 11.8*  RBC 3.11* 2.91* 3.11*  HGB 8.0* 7.4* 7.9*  HCT 27.5* 25.3* 26.9*  MCV 88.4 86.9 86.5  MCH 25.7* 25.4* 25.4*  MCHC 29.1* 29.2* 29.4*  RDW 19.5* 19.6* 19.4*  PLT 463* 347 347   BNP Recent Labs  Lab 04/02/20 0148  BNP 636.0*     Radiology/Studies:  DG Chest 2 View  Result Date: 04/03/2020 CLINICAL DATA:  Shortness of breath for 1 week. EXAM: CHEST - 2 VIEW COMPARISON:  04/02/2020 and prior exams FINDINGS: Enlargement of the cardiopericardial silhouette is noted. Moderate bilateral pleural effusions and bilateral LOWER lung atelectasis/consolidation noted. No pneumothorax or acute bony abnormality. IMPRESSION:  Enlargement of the cardiopericardial silhouette with moderate bilateral pleural effusions and bilateral LOWER lung atelectasis/consolidation. Electronically Signed   By: Margarette Canada M.D.   On: 04/03/2020 10:47   DG Chest Port 1 View  Result Date: 04/02/2020 CLINICAL DATA:  Shortness of breath for 4-5 days, worsening, COVID testing pending EXAM: PORTABLE CHEST 1 VIEW COMPARISON:  Radiograph 12/01/2019, CT 12/01/2019 FINDINGS: Chronically coarsened interstitial and bronchitic features with new patchy consolidative opacity in the right infrahilar lung and retrocardiac space as well as likely layering bilateral effusions. Mild pulmonary vascular congestion is present. Cardiac size is similar to prior though portions are obscured by overlying opacity. The aorta is calcified. The remaining cardiomediastinal contours are unremarkable. No other acute osseous or soft tissue abnormality. Degenerative changes are present in the imaged spine and shoulders. Telemetry leads overlie the chest. IMPRESSION: 1. Patchy consolidative opacity in the right infrahilar lung and retrocardiac space with likely layering bilateral effusions, could reflect pneumonia, asymmetric edema or combination there of. 2. Chronically coarsened interstitial and bronchitic features. 3.  Aortic Atherosclerosis (ICD10-I70.0). Electronically Signed   By: Lovena Le M.D.   On: 04/02/2020 02:07   ECHOCARDIOGRAM LIMITED  Result Date: 04/02/2020    ECHOCARDIOGRAM LIMITED REPORT   Patient Name:   CLARENE Schmidt Date of Exam: 04/02/2020 Medical Rec #:  [id number]  Height:       66.0 in Accession #:    [id number]        Weight:       153.0 lb Date of Birth:  26-Jan-1932         BSA:          1.785 m Patient Age:    52 years          BP:           95/63 mmHg Patient Gender: F                 HR:           112 bpm. Exam Location:  [facility] Procedure: Limited Echo, Cardiac Doppler and Color Doppler Indications:    CHF-Acute Diastolic  History:         Patient has prior history of Echocardiogram examinations, most                 recent 10/16/2019. CHF, Arrythmias:Atrial Fibrillation,                 Signs/Symptoms:Shortness of Breath; Risk Factors:Hypertension                 and Former Smoker. DOE. Orthopnea. Elevated troponin.  Sonographer:    Clayton Lefort RDCS (AE) Referring Phys: [id number] OLADAPO ADEFESO IMPRESSIONS  1. Hypokinesis of the inferior wall (base, mid). Left ventricular ejection fraction, by estimation, is 45 to 50%. The left ventricle has mildly decreased function. There is mild left ventricular hypertrophy. Left ventricular diastolic parameters are indeterminate.  2. Right ventricular systolic function is low normal. The right ventricular size is mildly enlarged. There is moderately elevated pulmonary artery systolic pressure.  3. Left atrial size was mildly dilated.  4. Right atrial size was moderately dilated.  5. Large pleural effusion.  6. There are a couple MR jets; the largest is eccentric directed posteriorly along posterior wall of LA COmpared to previous report, MR is more severe. . The mitral valve is abnormal. Severe mitral valve regurgitation.  7. Tricuspid valve regurgitation is moderate.  8. The aortic valve is tricuspid. Aortic valve regurgitation is not visualized. Mild to moderate aortic valve sclerosis/calcification is present, without any evidence of aortic stenosis.  9. The inferior vena cava is dilated in size with <50% respiratory variability, suggesting right atrial pressure of 15 mmHg. FINDINGS  Left Ventricle: Hypokinesis of the inferior wall (base, mid). Left ventricular ejection fraction, by estimation, is 45 to 50%. The left ventricle has mildly decreased function. The left ventricular internal cavity size was normal in size. There is mild left ventricular hypertrophy. Left ventricular diastolic parameters are indeterminate. Right Ventricle: The right ventricular size is mildly enlarged. Right vetricular wall thickness  was not assessed. Right ventricular systolic function is low normal. There is moderately elevated pulmonary artery systolic pressure. The tricuspid regurgitant velocity is 3.15 m/s, and with an assumed right atrial pressure of 15 mmHg, the estimated right ventricular systolic pressure is 47.8 mmHg. Left Atrium: Left atrial size was mildly dilated. Right Atrium: Right atrial size was moderately dilated. Pericardium: Trivial pericardial effusion is present. Mitral Valve: There are a couple MR jets; the largest is eccentric directed posteriorly along posterior wall of LA COmpared to previous report, MR is more severe. The mitral valve is abnormal. There is moderate thickening of the mitral valve leaflet(s). Mild mitral annular calcification. Severe mitral valve regurgitation. Tricuspid Valve: The tricuspid valve is normal in structure. Tricuspid valve regurgitation  is moderate. Aortic Valve: The aortic valve is tricuspid. Aortic valve regurgitation is not visualized. Mild to moderate aortic valve sclerosis/calcification is present, without any evidence of aortic stenosis. Pulmonic Valve: The pulmonic valve was not well visualized. Pulmonic valve regurgitation is not visualized. Aorta: The aortic root and ascending aorta are structurally normal, with no evidence of dilitation. Venous: The inferior vena cava is dilated in size with less than 50% respiratory variability, suggesting right atrial pressure of 15 mmHg. IAS/Shunts: No atrial level shunt detected by color flow Doppler. Additional Comments: There is a large pleural effusion. LEFT VENTRICLE PLAX 2D LVIDd:         4.71 cm LVIDs:         3.62 cm LV PW:         1.29 cm LV IVS:        1.35 cm LVOT diam:     1.80 cm LVOT Area:     2.54 cm  LEFT ATRIUM         Index LA diam:    4.90 cm 2.75 cm/m   AORTA Ao Root diam: 2.80 cm Ao Asc diam:  3.30 cm MR Peak grad:    97.2 mmHg   TRICUSPID VALVE MR Mean grad:    61.0 mmHg   TR Peak grad:   39.7 mmHg MR Vmax:          493.00 cm/s TR Vmax:        315.00 cm/s MR Vmean:        359.0 cm/s MR PISA:         3.08 cm    SHUNTS MR PISA Eff ROA: 24 mm      Systemic Diam: 1.80 cm MR PISA Radius:  0.70 cm Dorris Carnes MD Electronically signed by Dorris Carnes MD Signature Date/Time: 04/02/2020/5:26:49 PM    Final     Assessment and Plan:   Acute on chronic diastolic and now new systolic CHF EF 16-10% with worsening MR on echo. Afib rates going fast since cardizem stopped on admission for HR 50's. Now HR up over 100 and started on metoprolol. I/O's negative 1.7 L and requiring less O2 and swelling down. On Lasix 40 mg q 8, Crt. 1.45. Continue diuresis  Severe MR on echo 04/02/20 ? Worse since in CHF. Continue diuresis and re-evaluate as outpatient to see if improves.   Persistent AFib-failed to maintain NSR on Amiodarone and 2 DCCV's. Amiodarone stopped by Dr. Lovena Le earlier this month with plans for 3 day Zio in 3 months to make sure rate controlled. Diltiazem stopped on admission because of bradycardia and now on metoprolol for fast rates. Will change to Toprol XL 50 mg with reduced EF. On Eliquis 2.5 mg bid(Crt was 1.6) (was on 5 mg bid PTA). HR's from home reviewed-some in the 50's but most 90-low 100's.  NSVT-14 beats-K was 2.7 but now replaced  HTN BP tends to run low limiting options.  Anemia-Hgb 10.5 02/02/20 now 7.9, could be contributing to SOB.  Risk Assessment/Risk Scores:     New York Heart Association (NYHA) Functional Class NYHA Class III  CHA2DS2-VASc Score = 5  This indicates a 7.2% annual risk of stroke. The patient's score is based upon: CHF History: Yes HTN History: Yes Diabetes History: No Stroke History: No Vascular Disease History: No Age Score: 2 Gender Score: 1    For questions or updates, please contact [place] Please consult [email or web address] for contact info under    Signed, Ermalinda Barrios, PA-C  04/05/2020 10:02 AM   Attending note:  Patient seen and examined.  I reviewed the  chart and discussed the case with Erin Schmidt, I agree with her above findings.  Erin Schmidt is currently admitted to the hospital with worsening shortness of breath associated with bilateral pleural effusions in the setting of persistent atrial fibrillation, LVEF now 45 to 50% range, and severe mitral regurgitation which has progressed compared to prior assessment in October 2021.  Most recent medication adjustment was discontinuation of amiodarone by Dr. Lovena Le, she has failed to maintain sinus rhythm.  Heart rate at home largely in the 90s to low 100s although she did have 2 recent measurements in the 50s.  She had been on Cardizem for heart rate control, discontinued by primary team given concerns about cardiomyopathy.  She has improved symptomatically with diuresis, being reevaluated by radiology to determine if therapeutic thoracentesis would be useful as well.  She is also noted to be progressively anemic with baseline history of iron deficiency anemia followed by hematology.  On examination this morning she states that she feels better, no chest pain or palpitations.  She is afebrile.  Heart rate in the 90s to low 100s by telemetry.  Blood pressure 99/60.  Lungs exhibit decreased breath sounds at the bases bilaterally with scattered crackles.  Cardiac exam reveals irregularly irregular rhythm with 2/6 apical systolic murmur.  No peripheral edema.  Pertinent lab work includes potassium 4.3, creatinine 1.36 up to 1.45, hemoglobin 7.9, platelets 347, high-sensitivity troponin I 19 and 17 most recently and flat pattern, BNP 636.  Chest x-ray from March 26 reported enlargement of the cardiac silhouette with moderate sized bilateral pleural effusions and lower lobe atelectasis.  I reviewed her ECG from March 25 showing atrial fibrillation at 50 bpm, right bundle branch block, left anterior fascicular block.  Acute combined heart failure, LVEF 45 to 50% range by most recent assessment, associated with  persistent atrial fibrillation and suboptimal heart rate control as well as progressive mitral regurgitation.  She is improving symptomatically with diuresis, lungs are being reimaged to determine whether therapeutic thoracentesis should be considered.  Agree with discontinuation of diltiazem, this will be replaced with Toprol-XL.  Relatively low blood pressures at baseline limit further additions such as ARB or Entresto, fluctuating renal function is also of concern in this regard.  Agree with reduction in Eliquis to 2.5 mg twice daily for now, continue IV Lasix with follow-up BMET in a.m.  Hopefully, with better heart rate control, LVEF may improve as well as degree of mitral regurgitation.  It looks like rhythm control strategy is not going to be effective.  Also discussed work-up of progressive anemia with primary team.  Satira Sark, M.D., F.A.C.C.
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
## 2020-04-05 NOTE — Discharge Instructions (Signed)
Thoracentesis, Care After This sheet gives you information about how to care for yourself after your procedure. Your health care provider may also give you more specific instructions. If you have problems or questions, contact your health care provider. What can I expect after the procedure? After your procedure, it is common to have some pain at the site where the needle was inserted (puncture site). Follow these instructions at home: Care of the puncture site  Follow instructions from your health care provider about how to take care of your puncture site. Make sure you: ? Wash your hands with soap and water before you change your bandage (dressing). If soap and water are not available, use hand sanitizer. ? Change your dressing as told by your health care provider.  Check the puncture site every day for signs of infection. Check for: ? Redness, swelling, or pain. ? Fluid or blood. ? Warmth. ? Pus or a bad smell.  Do not take baths, swim, or use a hot tub until your health care provider approves. General instructions  Take over-the-counter and prescription medicines only as told by your health care provider.  Do not drive for 24 hours if you were given a medicine to help you relax (sedative) during your procedure.  Drink enough fluid to keep your urine pale yellow.  You may return to your normal diet and normal activities as told by your health care provider.  Keep all follow-up visits as told by your health care provider. This is important.   Contact a health care provider if you:  Have redness, swelling, or pain at your puncture site.  Have fluid or blood coming from your puncture site.  Notice that your puncture site feels warm to the touch.  Have pus or a bad smell coming from your puncture site.  Have a fever.  Have chills.  Have nausea or vomiting.  Have trouble breathing.  Develop a worsening cough. Get help right away if you:  Have extreme shortness of  breath.  Develop chest pain.  Faint or feel light-headed. Summary  After your procedure, it is common to have some pain at the site where the needle was inserted (puncture site).  Wash your hands with soap and water before you change your bandage (dressing).  Check your puncture site every day for signs of infection.  Take over-the-counter and prescription medicines only as told by your health care provider. This information is not intended to replace advice given to you by your health care provider. Make sure you discuss any questions you have with your health care provider. Document Revised: 08/28/2019 Document Reviewed: 08/28/2019 Elsevier Patient Education  2021 [employer]  

## 2020-04-06 ENCOUNTER — Inpatient Hospital Stay (HOSPITAL_COMMUNITY): Payer: Medicare Other

## 2020-04-06 ENCOUNTER — Encounter (HOSPITAL_COMMUNITY): Payer: Self-pay | Admitting: Internal Medicine

## 2020-04-06 DIAGNOSIS — I5041 Acute combined systolic (congestive) and diastolic (congestive) heart failure: Secondary | ICD-10-CM | POA: Diagnosis not present

## 2020-04-06 LAB — BODY FLUID CELL COUNT WITH DIFFERENTIAL
Eos, Fluid: 0 %
Lymphs, Fluid: 48 %
Monocyte-Macrophage-Serous Fluid: 34 % — ABNORMAL LOW (ref 50–90)
Neutrophil Count, Fluid: 18 % (ref 0–25)
Other Cells, Fluid: REACTIVE %
Total Nucleated Cell Count, Fluid: 1125 cu mm — ABNORMAL HIGH (ref 0–1000)

## 2020-04-06 LAB — GRAM STAIN: Gram Stain: NONE SEEN

## 2020-04-06 LAB — CBC
HCT: 24.8 % — ABNORMAL LOW (ref 36.0–46.0)
Hemoglobin: 7.4 g/dL — ABNORMAL LOW (ref 12.0–15.0)
MCH: 24.9 pg — ABNORMAL LOW (ref 26.0–34.0)
MCHC: 29.8 g/dL — ABNORMAL LOW (ref 30.0–36.0)
MCV: 83.5 fL (ref 80.0–100.0)
Platelets: 368 10*3/uL (ref 150–400)
RBC: 2.97 MIL/uL — ABNORMAL LOW (ref 3.87–5.11)
RDW: 19.9 % — ABNORMAL HIGH (ref 11.5–15.5)
WBC: 8.1 10*3/uL (ref 4.0–10.5)
nRBC: 0.6 % — ABNORMAL HIGH (ref 0.0–0.2)

## 2020-04-06 LAB — BASIC METABOLIC PANEL
Anion gap: 14 (ref 5–15)
BUN: 49 mg/dL — ABNORMAL HIGH (ref 8–23)
CO2: 23 mmol/L (ref 22–32)
Calcium: 8.2 mg/dL — ABNORMAL LOW (ref 8.9–10.3)
Chloride: 96 mmol/L — ABNORMAL LOW (ref 98–111)
Creatinine, Ser: 1.89 mg/dL — ABNORMAL HIGH (ref 0.44–1.00)
GFR, Estimated: 26 mL/min — ABNORMAL LOW (ref >60)
Glucose, Bld: 77 mg/dL (ref 70–99)
Potassium: 4 mmol/L (ref 3.5–5.1)
Sodium: 133 mmol/L — ABNORMAL LOW (ref 135–145)

## 2020-04-06 LAB — CYTOLOGY - NON PAP

## 2020-04-06 LAB — MAGNESIUM: Magnesium: 2.5 mg/dL — ABNORMAL HIGH (ref 1.7–2.4)

## 2020-04-06 LAB — PROTEIN, PLEURAL OR PERITONEAL FLUID: Total protein, fluid: <3 g/dL

## 2020-04-06 LAB — PREPARE RBC (CROSSMATCH)

## 2020-04-06 LAB — GLUCOSE, PLEURAL OR PERITONEAL FLUID: Glucose, Fluid: 81 mg/dL

## 2020-04-06 LAB — ABO/RH: ABO/RH(D): O POS

## 2020-04-06 MED ORDER — POLYETHYLENE GLYCOL 3350 17 G PO PACK
17.0000 g | PACK | Freq: Every day | ORAL | Status: DC
  Administered 2020-04-06 – 2020-04-07 (×2): 17 g via ORAL
  Filled 2020-04-06 (×4): qty 1

## 2020-04-06 MED ORDER — ONDANSETRON HCL 4 MG/2ML IJ SOLN: 4.0000 mg | Freq: Four times a day (QID) | INTRAMUSCULAR | Status: DC | PRN

## 2020-04-06 MED ORDER — FUROSEMIDE 10 MG/ML IJ SOLN: 20.0000 mg | Freq: Two times a day (BID) | INTRAMUSCULAR | Status: DC

## 2020-04-06 MED ORDER — SODIUM CHLORIDE 0.9% IV SOLUTION: Freq: Once | INTRAVENOUS | Status: AC

## 2020-04-06 MED ORDER — ALPRAZOLAM 0.5 MG PO TABS
0.5000 mg | ORAL_TABLET | Freq: Every day | ORAL | Status: AC
  Administered 2020-04-06: 0.5 mg via ORAL
  Filled 2020-04-06: qty 1

## 2020-04-06 MED ORDER — BISACODYL 10 MG RE SUPP
10.0000 mg | Freq: Once | RECTAL | Status: AC
  Administered 2020-04-06: 10 mg via RECTAL
  Filled 2020-04-06: qty 1

## 2020-04-06 MED ORDER — FUROSEMIDE 10 MG/ML IJ SOLN: 20.0000 mg | Freq: Once | INTRAMUSCULAR | Status: DC

## 2020-04-06 NOTE — Progress Notes (Signed)
Patient Demographics:    Erin Schmidt, is a 87 y.o. female, DOB - [birth year], DXA:[id number]  Admit date - 04/02/2020   Admitting Physician Bernadette Hoit, DO  Outpatient Primary MD for the patient is Sharilyn Sites, MD  LOS - 4   Chief Complaint  Patient presents with  . Abdominal Pain  . Shortness of Breath        Subjective:    Erin Schmidt today has no fevers, no emesis,  No chest pain,   -Dyspnea and hypoxia improving -Voiding less  Assessment  & Plan :    Principal Problem:   Acute on chronic combined systolic and diastolic CHF/H/o dCHF, now with New Systolic CHF Active Problems:   Normocytic anemia   Hypothyroidism   Acute exacerbation of CHF (congestive heart failure) (HCC)   Acute respiratory failure with hypoxia (HCC)   Thrombocytosis   Elevated brain natriuretic peptide (BNP) level   Elevated troponin   Essential hypertension   Acute kidney injury superimposed on CKD (HCC)   Hyperkalemia  Brief Summary:- 87 y.o. female with medical history significant for atrial fibrillation on Eliquis, hypertension, hypothyroidism, diastolic heart failure, rheumatoid arthritis and iron deficiency anemia  admitted on 04/02/2020 with increasing weight gain and acute on chronic diastolic dysfunction CHF exacerbation despite cardiology service increasing  patient's diuretics as outpatient  A/p 1)Acute on chronic combined systolic and diastolic CHF/H/o dCHF, now with New Systolic CHF---- -Repeat echo shows that MR is now severe, it was previously moderate mitral regurg -Repeat echo also shows moderate TR -Repeat echo with EF down to 45 to 50% from 50-55 % back in October 2021 -Clinical exam, chest x-ray and BNP consistent with CHF exacerbation -Daily weight and fluid input and output monitoring -Repeat chest x-ray bilateral pleural effusion-- s/p Rt ultrasound thoracentesis on 04/05/20 and  Lt thora on 04/06/20 --Creatinine trending up so change hold Lasix--give diuretic holiday -Last dose of Lasix was 04/07/2018 in AM (40 mg iv)  -Cardiology consult appreciated  2)Acute Hypoxic Resp Failure:- PTA patient was not on O2 -Oxygen requirement improved patient is down to 2 L of oxygen via nasal cannula from 8 L -Chest x-ray with possible pneumonia--patient without typical pneumonia symptoms, fevers, no leukocytosis and PCT is less than 0.10 -Hold off on antibiotics  -Hypoxia improving with diuresis and bilateral thoracentesis-pleural fluid Gram stain and cultures NGTD  3)AKI----acute kidney injury on CKD stage -3B with hyperkalemia- hyperkalemia resolved with Lokelma -Creatinine was 1.75 on 02/27/2020 - Creatinine peak of 2.31 04/02/2020 -Creatinine now up to 1.89--give diuretic holiday as above -Monitor renal function closely while diuresing patient renally adjust medications, avoid nephrotoxic agents / dehydration  / hypotension  4) chronic normocytic anemia--- suspect anemia of chronic kidney disease,  -Hemoglobin is 7.4--patient with tachycardia, dyspnea, hypoxia we will transfuse 1 unit of PRBCs on 04/06/2020 -On Eliquis but no bleeding concerns noted at this time  5)Hypothyroidism-- c/n levothyroxine 112 mcg daily  6)Chronic atrial fibrillation--Cardizem initially discontinued due to bradycardia, however due to drop in EF and tachycardia will use metoprolol instead for rate control--titrate metoprolol up as BP allows -failed to maintain NSR on Amiodarone and 2 DCCV's. Amiodarone stopped by Dr. Lovena Le earlier this month with plans for 3 day Zio in 3 months to  make sure rate controlled -c/n  Eliquis for stroke prophylaxis  7)Social/Ethics--- patient wants to be a DNI, but she is okay with CPR and ACLS protocols  Disposition/Need for in-Hospital Stay- patient unable to be discharged at this time due to -acute diastolic dysfunction CHF exacerbation requiring IV diuresis and close  monitoring of renal function and electrolytes, as well as acute hypoxic respiratory failure requiring oxygen supplementation at this time*  Status is: Inpatient  Remains inpatient appropriate because:Please see above   Disposition: The patient is from: Home              Anticipated d/c is to: Home              Anticipated d/c date is: 1 day              Patient currently is not medically stable to d/c. Barriers: Not Clinically Stable-   Procedures:- 04/05/20-right-sided thoracentesis removing 1 L -04/06/20 left-sided thoracentesis with 900 mL removal  Code Status : -  Code Status: Partial Code   Family Communication:    (patient is alert, awake and coherent)  Discussed with husband   Consults  :    DVT Prophylaxis  :   - SCDs   Place TED hose Start: 04/02/20 1307 apixaban (ELIQUIS) tablet 2.5 mg Start: 04/02/20 1000 SCDs Start: 04/02/20 0459 apixaban (ELIQUIS) tablet 2.5 mg    Lab Results  Component Value Date   PLT 368 04/06/2020   Inpatient Medications  Scheduled Meds: . sodium chloride   Intravenous Once  . apixaban  2.5 mg Oral BID  . Chlorhexidine Gluconate Cloth  6 each Topical Daily  . levothyroxine  112 mcg Oral Q0600  . metoprolol succinate  50 mg Oral Daily  . pantoprazole  40 mg Oral Daily   Continuous Infusions: PRN Meds:.acetaminophen **OR** acetaminophen, methocarbamol, ondansetron (ZOFRAN) IV, traMADol  Anti-infectives (From admission, onward)   None        Objective:   Vitals:   04/06/20 1301 04/06/20 1408 04/06/20 1511 04/06/20 1515  BP: (!) 96/49 102/63 91/61 91/61   Pulse: (!) 51 (!) 51 (!) 53 (!) 55  Resp:   18   Temp: (!) 97.2 F (36.2 C) (!) 97.5 F (36.4 C) (!) 97.5 F (36.4 C) (!) 97.5 F (36.4 C)  TempSrc: Oral Oral  Oral  SpO2: 95% 95% 94% 94%  Weight:      Height:        Wt Readings from Last 3 Encounters:  04/04/20 67.8 kg  03/09/20 66 kg  02/27/20 65 kg    Intake/Output Summary (Last 24 hours) at 04/06/2020  1627 Last data filed at 04/06/2020 1511 Gross per 24 hour  Intake 1062 ml  Output 350 ml  Net 712 ml   Physical Exam  Gen:- Awake Alert,  In no apparent distress  HEENT:- [place].AT, No sclera icterus Nose- [place] 2L/min Neck-Supple Neck, JVD less distended Lungs-improved air movement,  bibasilar rales left more than right CV- S1, S2 normal, irregularly irregular, less tachycardic Abd-  +ve B.Sounds, Abd Soft, No tenderness,    Extremity/Skin:-Improving lower extremity pitting edema, pedal pulses present Psych-affect is appropriate, oriented x3 Neuro-generalized weakness no new focal deficits, no tremors   Data Review:   Micro Results Recent Results (from the past 240 hour(s))  Resp Panel by RT-PCR (Flu A&B, Covid) Nasopharyngeal Swab     Status: None   Collection Time: 04/02/20  1:44 AM   Specimen: Nasopharyngeal Swab; Nasopharyngeal(NP) swabs in vial transport medium  Result Value Ref Range Status   SARS Coronavirus 2 by RT PCR NEGATIVE NEGATIVE Final    Comment: (NOTE) SARS-CoV-2 target nucleic acids are NOT DETECTED.  The SARS-CoV-2 RNA is generally detectable in upper respiratory specimens during the acute phase of infection. The lowest concentration of SARS-CoV-2 viral copies this assay can detect is 138 copies/mL. A negative result does not preclude SARS-Cov-2 infection and should not be used as the sole basis for treatment or other patient management decisions. A negative result may occur with  improper specimen collection/handling, submission of specimen other than nasopharyngeal swab, presence of viral mutation(s) within the areas targeted by this assay, and inadequate number of viral copies(<138 copies/mL). A negative result must be combined with clinical observations, patient history, and epidemiological information. The expected result is Negative.  Fact Sheet for Patients:  EntrepreneurPulse.com.au  Fact Sheet for Healthcare Providers:   IncredibleEmployment.be  This test is no t yet approved or cleared by the [place] FDA and  has been authorized for detection and/or diagnosis of SARS-CoV-2 by FDA under an Emergency Use Authorization (EUA). This EUA will remain  in effect (meaning this test can be used) for the duration of the COVID-19 declaration under Section 564(b)(1) of the Act, 21 U.S.C.section 360bbb-3(b)(1), unless the authorization is terminated  or revoked sooner.       Influenza A by PCR NEGATIVE NEGATIVE Final   Influenza B by PCR NEGATIVE NEGATIVE Final    Comment: (NOTE) The Xpert Xpress SARS-CoV-2/FLU/RSV plus assay is intended as an aid in the diagnosis of influenza from Nasopharyngeal swab specimens and should not be used as a sole basis for treatment. Nasal washings and aspirates are unacceptable for Xpert Xpress SARS-CoV-2/FLU/RSV testing.  Fact Sheet for Patients: EntrepreneurPulse.com.au  Fact Sheet for Healthcare Providers: IncredibleEmployment.be  This test is not yet approved or cleared by the [place] FDA and has been authorized for detection and/or diagnosis of SARS-CoV-2 by FDA under an Emergency Use Authorization (EUA). This EUA will remain in effect (meaning this test can be used) for the duration of the COVID-19 declaration under Section 564(b)(1) of the Act, 21 U.S.C. section 360bbb-3(b)(1), unless the authorization is terminated or revoked.  Performed at [facility], [address], [place], [place] [address]   MRSA PCR Screening     Status: None   Collection Time: 04/02/20  4:47 AM   Specimen: Nasal Mucosa; Nasopharyngeal  Result Value Ref Range Status   MRSA by PCR NEGATIVE NEGATIVE Final    Comment:        The GeneXpert MRSA Assay (FDA approved for NASAL specimens only), is one component of a comprehensive MRSA colonization surveillance program. It is not intended to diagnose MRSA infection nor to guide  or monitor treatment for MRSA infections. Performed at [facility], [address], [place], [place] [address]   Culture, body fluid w Gram Stain-bottle     Status: None (Preliminary result)   Collection Time: 04/05/20 12:17 PM   Specimen: Pleura  Result Value Ref Range Status   Specimen Description PLEURAL RIGHT LUNG  Final   Special Requests   Final    BOTTLES DRAWN AEROBIC AND ANAEROBIC Blood Culture adequate volume   Culture   Final    NO GROWTH < 24 HOURS Performed at [facility], [address], [place],  [address]    Report Status PENDING  Incomplete  Gram stain     Status: None   Collection Time: 04/05/20 12:17 PM   Specimen: Pleura  Result Value Ref Range Status   Specimen Description PLEURAL  Final   Special Requests NONE  Final   Gram Stain   Final    NO ORGANISMS SEEN FEW WBC SEEN WBC PRESENT, PREDOMINANTLY MONONUCLEAR Performed at [facility], [address] [place], [place] [address]    Report Status 04/05/2020 FINAL  Final  Gram stain     Status: None   Collection Time: 04/06/20 10:00 AM   Specimen: Pleura  Result Value Ref Range Status   Specimen Description PLEURAL  Final   Special Requests NONE  Final   Gram Stain   Final    NO ORGANISMS SEEN WBC PRESENT,BOTH PMN AND MONONUCLEAR Performed at [facility], [address] [place], [place] [address]    Report Status 04/06/2020 FINAL  Final    Radiology Reports DG Chest 2 View  Result Date: 04/06/2020 CLINICAL DATA:  Post LEFT thoracentesis EXAM: CHEST - 2 VIEW COMPARISON:  04/05/2020 FINDINGS: Enlargement of cardiac silhouette with pulmonary vascular congestion. Atherosclerotic calcification aorta. Accentuation of perihilar markings likely reflect mild pulmonary edema. Decreased LEFT pleural effusion post thoracentesis with mild residual LEFT basilar atelectasis. New airspace consolidation in RIGHT middle lobe question pneumonia. No pneumothorax. Bones demineralized. IMPRESSION: Significantly  decreased LEFT pleural effusion with mild residual LEFT basilar atelectasis. No pneumothorax following thoracentesis. Enlargement of cardiac silhouette with pulmonary vascular congestion and minimal perihilar edema. New RIGHT middle lobe airspace consolidation question pneumonia. Electronically Signed   By: Lavonia Dana M.D.   On: 04/06/2020 11:08   DG Chest 2 View  Result Date: 04/05/2020 CLINICAL DATA:  BILATERAL pleural effusions, post RIGHT thoracentesis EXAM: CHEST - 2 VIEW COMPARISON:  04/03/2020 FINDINGS: PA view performed with expiratory technique due to preceding thoracentesis. Enlargement of cardiac silhouette with pulmonary vascular congestion. Atherosclerotic calcification aorta. Perihilar infiltrates consistent with pulmonary edema. Minimal residual RIGHT pleural effusion and basilar atelectasis post thoracentesis. No pneumothorax. Moderate LEFT pleural effusion, slightly increased from previous exam. No acute osseous findings. IMPRESSION: Enlargement of cardiac silhouette with pulmonary vascular congestion and mild pulmonary edema. BILATERAL pleural effusions and basilar atelectasis, slightly increased on LEFT as well as decreased on RIGHT post RIGHT thoracentesis. No pneumothorax following RIGHT thoracentesis. Electronically Signed   By: Lavonia Dana M.D.   On: 04/05/2020 13:07   DG Chest 2 View  Result Date: 04/03/2020 CLINICAL DATA:  Shortness of breath for 1 week. EXAM: CHEST - 2 VIEW COMPARISON:  04/02/2020 and prior exams FINDINGS: Enlargement of the cardiopericardial silhouette is noted. Moderate bilateral pleural effusions and bilateral LOWER lung atelectasis/consolidation noted. No pneumothorax or acute bony abnormality. IMPRESSION: Enlargement of the cardiopericardial silhouette with moderate bilateral pleural effusions and bilateral LOWER lung atelectasis/consolidation. Electronically Signed   By: Margarette Canada M.D.   On: 04/03/2020 10:47   DG Chest Port 1 View  Result Date:  04/02/2020 CLINICAL DATA:  Shortness of breath for 4-5 days, worsening, COVID testing pending EXAM: PORTABLE CHEST 1 VIEW COMPARISON:  Radiograph 12/01/2019, CT 12/01/2019 FINDINGS: Chronically coarsened interstitial and bronchitic features with new patchy consolidative opacity in the right infrahilar lung and retrocardiac space as well as likely layering bilateral effusions. Mild pulmonary vascular congestion is present. Cardiac size is similar to prior though portions are obscured by overlying opacity. The aorta is calcified. The remaining cardiomediastinal contours are unremarkable. No other acute osseous or soft tissue abnormality. Degenerative changes are present in the imaged spine and shoulders. Telemetry leads overlie the chest. IMPRESSION: 1. Patchy consolidative opacity in the right infrahilar  lung and retrocardiac space with likely layering bilateral effusions, could reflect pneumonia, asymmetric edema or combination there of. 2. Chronically coarsened interstitial and bronchitic features. 3.  Aortic Atherosclerosis (ICD10-I70.0). Electronically Signed   By: Lovena Le M.D.   On: 04/02/2020 02:07   MM 3D SCREEN BREAST BILATERAL  Result Date: 03/24/2020 CLINICAL DATA:  Screening. EXAM: DIGITAL SCREENING BILATERAL MAMMOGRAM WITH TOMOSYNTHESIS AND CAD TECHNIQUE: Bilateral screening digital craniocaudal and mediolateral oblique mammograms were obtained. Bilateral screening digital breast tomosynthesis was performed. The images were evaluated with computer-aided detection. COMPARISON:  Previous exam(s). ACR Breast Density Category b: There are scattered areas of fibroglandular density. FINDINGS: There are no findings suspicious for malignancy. The images were evaluated with computer-aided detection. IMPRESSION: No mammographic evidence of malignancy. A result letter of this screening mammogram will be mailed directly to the patient. RECOMMENDATION: Screening mammogram in one year. (Code:SM-B-01Y) BI-RADS  CATEGORY  1: Negative. Electronically Signed   By: Lajean Manes M.D.   On: 03/24/2020 14:23   ECHOCARDIOGRAM LIMITED  Result Date: 04/02/2020    ECHOCARDIOGRAM LIMITED REPORT   Patient Name:   RHANDA LEMIRE Date of Exam: 04/02/2020 Medical Rec #:  [id number]         Height:       66.0 in Accession #:    [id number]        Weight:       153.0 lb Date of Birth:  Apr 20, 1932         BSA:          1.785 m Patient Age:    38 years          BP:           95/63 mmHg Patient Gender: F                 HR:           112 bpm. Exam Location:  [facility] Procedure: Limited Echo, Cardiac Doppler and Color Doppler Indications:    CHF-Acute Diastolic  History:        Patient has prior history of Echocardiogram examinations, most                 recent 10/16/2019. CHF, Arrythmias:Atrial Fibrillation,                 Signs/Symptoms:Shortness of Breath; Risk Factors:Hypertension                 and Former Smoker. DOE. Orthopnea. Elevated troponin.  Sonographer:    Clayton Lefort RDCS (AE) Referring Phys: [id number] OLADAPO ADEFESO IMPRESSIONS  1. Hypokinesis of the inferior wall (base, mid). Left ventricular ejection fraction, by estimation, is 45 to 50%. The left ventricle has mildly decreased function. There is mild left ventricular hypertrophy. Left ventricular diastolic parameters are indeterminate.  2. Right ventricular systolic function is low normal. The right ventricular size is mildly enlarged. There is moderately elevated pulmonary artery systolic pressure.  3. Left atrial size was mildly dilated.  4. Right atrial size was moderately dilated.  5. Large pleural effusion.  6. There are a couple MR jets; the largest is eccentric directed posteriorly along posterior wall of LA COmpared to previous report, MR is more severe. . The mitral valve is abnormal. Severe mitral valve regurgitation.  7. Tricuspid valve regurgitation is moderate.  8. The aortic valve is tricuspid. Aortic valve regurgitation is not visualized. Mild to moderate  aortic valve sclerosis/calcification is present, without any evidence of aortic stenosis.  9. The inferior vena cava is dilated in size with <50% respiratory variability, suggesting right atrial pressure of 15 mmHg. FINDINGS  Left Ventricle: Hypokinesis of the inferior wall (base, mid). Left ventricular ejection fraction, by estimation, is 45 to 50%. The left ventricle has mildly decreased function. The left ventricular internal cavity size was normal in size. There is mild left ventricular hypertrophy. Left ventricular diastolic parameters are indeterminate. Right Ventricle: The right ventricular size is mildly enlarged. Right vetricular wall thickness was not assessed. Right ventricular systolic function is low normal. There is moderately elevated pulmonary artery systolic pressure. The tricuspid regurgitant velocity is 3.15 m/s, and with an assumed right atrial pressure of 15 mmHg, the estimated right ventricular systolic pressure is 96.7 mmHg. Left Atrium: Left atrial size was mildly dilated. Right Atrium: Right atrial size was moderately dilated. Pericardium: Trivial pericardial effusion is present. Mitral Valve: There are a couple MR jets; the largest is eccentric directed posteriorly along posterior wall of LA COmpared to previous report, MR is more severe. The mitral valve is abnormal. There is moderate thickening of the mitral valve leaflet(s). Mild mitral annular calcification. Severe mitral valve regurgitation. Tricuspid Valve: The tricuspid valve is normal in structure. Tricuspid valve regurgitation is moderate. Aortic Valve: The aortic valve is tricuspid. Aortic valve regurgitation is not visualized. Mild to moderate aortic valve sclerosis/calcification is present, without any evidence of aortic stenosis. Pulmonic Valve: The pulmonic valve was not well visualized. Pulmonic valve regurgitation is not visualized. Aorta: The aortic root and ascending aorta are structurally normal, with no evidence of  dilitation. Venous: The inferior vena cava is dilated in size with less than 50% respiratory variability, suggesting right atrial pressure of 15 mmHg. IAS/Shunts: No atrial level shunt detected by color flow Doppler. Additional Comments: There is a large pleural effusion. LEFT VENTRICLE PLAX 2D LVIDd:         4.71 cm LVIDs:         3.62 cm LV PW:         1.29 cm LV IVS:        1.35 cm LVOT diam:     1.80 cm LVOT Area:     2.54 cm  LEFT ATRIUM         Index LA diam:    4.90 cm 2.75 cm/m   AORTA Ao Root diam: 2.80 cm Ao Asc diam:  3.30 cm MR Peak grad:    97.2 mmHg   TRICUSPID VALVE MR Mean grad:    61.0 mmHg   TR Peak grad:   39.7 mmHg MR Vmax:         493.00 cm/s TR Vmax:        315.00 cm/s MR Vmean:        359.0 cm/s MR PISA:         3.08 cm    SHUNTS MR PISA Eff ROA: 24 mm      Systemic Diam: 1.80 cm MR PISA Radius:  0.70 cm Dorris Carnes MD Electronically signed by Dorris Carnes MD Signature Date/Time: 04/02/2020/5:26:49 PM    Final    US THORACENTESIS ASP PLEURAL SPACE W/IMG GUIDE  Result Date: 04/06/2020 INDICATION: Diffuse LEFT pleural effusion EXAM: ULTRASOUND GUIDED DIAGNOSTIC AND THERAPEUTIC LEFT THORACENTESIS MEDICATIONS: None. COMPLICATIONS: None immediate PROCEDURE: An ultrasound guided thoracentesis was thoroughly discussed with the patient and questions answered. The benefits, risks, alternatives and complications were also discussed. The patient understands and wishes to proceed with the procedure. Written consent was obtained. Ultrasound was performed to  localize and mark an adequate pocket of fluid in the LEFT chest. The area was then prepped and draped in the normal sterile fashion. 1% Lidocaine was used for local anesthesia. Under ultrasound guidance a 8 French thoracentesis catheter was introduced. Thoracentesis was performed. The catheter was removed and a dressing applied. Procedure tolerated well by patient without immediate complication. FINDINGS: A total of approximately 900 mL of clear  yellow LEFT pleural fluid was removed. Samples were sent to the laboratory as requested by the clinical team. IMPRESSION: Successful ultrasound guided LEFT thoracentesis yielding 900 mL of pleural fluid. Electronically Signed   By: Lavonia Dana M.D.   On: 04/06/2020 11:39   US THORACENTESIS ASP PLEURAL SPACE W/IMG GUIDE  Result Date: 04/05/2020 INDICATION: BILATERAL pleural effusions, shortness of breath, orthopnea EXAM: ULTRASOUND GUIDED DIAGNOSTIC AND THERAPEUTIC RIGHT THORACENTESIS MEDICATIONS: None. COMPLICATIONS: None immediate. PROCEDURE: An ultrasound guided thoracentesis was thoroughly discussed with the patient and questions answered. The benefits, risks, alternatives and complications were also discussed. The patient understands and wishes to proceed with the procedure. Written consent was obtained. Ultrasound was performed to localize and mark an adequate pocket of fluid in the RIGHT chest. The area was then prepped and draped in the normal sterile fashion. 1% Lidocaine was used for local anesthesia. Under ultrasound guidance a 8 French thoracentesis catheter was introduced. Thoracentesis was performed. The catheter was removed and a dressing applied. FINDINGS: A total of approximately 1.03 L of yellow RIGHT pleural fluid was removed. Samples were sent to the laboratory as requested by the clinical team. IMPRESSION: Successful ultrasound guided RIGHT thoracentesis yielding 1.03 L of pleural fluid. Electronically Signed   By: Lavonia Dana M.D.   On: 04/05/2020 13:06     CBC Recent Labs  Lab 04/02/20 0148 04/03/20 0401 04/04/20 0606 04/06/20 0503  WBC 9.8 8.8 11.8* 8.1  HGB 8.0* 7.4* 7.9* 7.4*  HCT 27.5* 25.3* 26.9* 24.8*  PLT 463* 347 347 368  MCV 88.4 86.9 86.5 83.5  MCH 25.7* 25.4* 25.4* 24.9*  MCHC 29.1* 29.2* 29.4* 29.8*  RDW 19.5* 19.6* 19.4* 19.9*  LYMPHSABS 1.9  --   --   --   MONOABS 1.2*  --   --   --   EOSABS 0.1  --   --   --   BASOSABS 0.1  --   --   --     Chemistries   Recent Labs  Lab 04/02/20 0148 04/03/20 0401 04/04/20 0541 04/05/20 0438 04/06/20 0503  NA 135 138  136 136 135 133*  K 5.7* 2.7*  2.7* 3.6 4.3 4.0  CL 99 98  98 99 97* 96*  CO2 21* 27  25 26 27 23   GLUCOSE 133* 97  97 109* 114* 77  BUN 36* 32*  33* 28* 32* 49*  CREATININE 2.37* 1.61*  1.60* 1.36* 1.45* 1.89*  CALCIUM 8.7* 8.3*  8.2* 8.5* 8.4* 8.2*  MG  --   --   --   --  2.5*  AST 32 32  --   --   --   ALT 20 24  --   --   --   ALKPHOS 105 96  --   --   --   BILITOT 1.1 1.1  --   --   --    ------------------------------------------------------------------------------------------------------------------ No results for input(s): CHOL, HDL, LDLCALC, TRIG, CHOLHDL, LDLDIRECT in the last 72 hours.  No results found for: HGBA1C ------------------------------------------------------------------------------------------------------------------ No results for input(s): TSH, T4TOTAL, T3FREE, THYROIDAB in the  last 72 hours.  Invalid input(s): FREET3 ------------------------------------------------------------------------------------------------------------------ No results for input(s): VITAMINB12, FOLATE, FERRITIN, TIBC, IRON, RETICCTPCT in the last 72 hours.  Coagulation profile Recent Labs  Lab 04/03/20 0401  INR 2.0*    No results for input(s): DDIMER in the last 72 hours.  Cardiac Enzymes No results for input(s): CKMB, TROPONINI, MYOGLOBIN in the last 168 hours.  Invalid input(s): CK ------------------------------------------------------------------------------------------------------------------    Component Value Date/Time   BNP 636.0 (H) 04/02/2020 4462    Roxan Hockey M.D on 04/06/2020 at 4:27 PM  Go to [email or web address] - for contact info  [facility] - Office  [phone]

## 2020-04-06 NOTE — Sedation Documentation (Signed)
PT tolerated left sided thoracentesis procedure well today and 900 mL of clear yellow fluid removed with labs collected and sent for processing. PT taken to xray department for post chest xray at this time.

## 2020-04-06 NOTE — Procedures (Signed)
PreOperative Dx: LEFT pleural effusion Postoperative Dx: LEFT pleural effusion Procedure:   US guided LEFT thoracentesis Radiologist:  Thornton Papas Anesthesia:  10 ml of 1% lidocaine Specimen:  900 mL of clear yellow colored fluid EBL:   < 1 ml Complications: None

## 2020-04-06 NOTE — Progress Notes (Addendum)
Progress Note  Patient Name: Erin Schmidt Date of Encounter: 04/06/2020  [facility] Cardiologist: Carlyle Dolly, MD   Subjective   Breathing better.  Inpatient Medications    Scheduled Meds: . apixaban  2.5 mg Oral BID  . Chlorhexidine Gluconate Cloth  6 each Topical Daily  . furosemide  20 mg Intravenous BID  . levothyroxine  112 mcg Oral Q0600  . metoprolol succinate  50 mg Oral Daily  . pantoprazole  40 mg Oral Daily    PRN Meds: acetaminophen **OR** acetaminophen, methocarbamol, traMADol   Vital Signs    Vitals:   04/05/20 1304 04/05/20 2054 04/05/20 2058 04/06/20 0515  BP: 98/72 98/74  (!) 108/58  Pulse: 97 (!) 55  (!) 101  Resp: 20 19  20   Temp: 97.7 F (36.5 C) 97.8 F (36.6 C)  98.8 F (37.1 C)  TempSrc: Oral Oral  Oral  SpO2: 95% (!) 89% 92% 94%  Weight:      Height:        Intake/Output Summary (Last 24 hours) at 04/06/2020 0836 Last data filed at 04/05/2020 2058 Gross per 24 hour  Intake 720 ml  Output 200 ml  Net 520 ml   Last 3 Weights 04/04/2020 04/03/2020 04/02/2020  Weight (lbs) 149 lb 8 oz 152 lb 1.9 oz 153 lb  Weight (kg) 67.813 kg 69 kg 69.4 kg      Telemetry    Afib in 90's - Personally Reviewed  Physical Exam   GEN: No acute distress.   Neck: increased JVD Cardiac:irreg with 2/6 systolic murmur apex Respiratory: crackles at left base GI: Soft, nontender, non-distended  MS: trace edema; No deformity. Neuro:  Nonfocal  Psych: Normal affect   Labs    High Sensitivity Troponin:   Recent Labs  Lab 04/02/20 0148 04/02/20 0348 04/02/20 0529 04/02/20 0755  TROPONINIHS 20* 21* 19* 17      Chemistry Recent Labs  Lab 04/02/20 0148 04/03/20 0401 04/04/20 0541 04/05/20 0438 04/06/20 0503  NA 135 138  136 136 135 133*  K 5.7* 2.7*  2.7* 3.6 4.3 4.0  CL 99 98  98 99 97* 96*  CO2 21* 27  25 26 27 23   GLUCOSE 133* 97  97 109* 114* 77  BUN 36* 32*  33* 28* 32* 49*  CREATININE 2.37* 1.61*  1.60* 1.36*  1.45* 1.89*  CALCIUM 8.7* 8.3*  8.2* 8.5* 8.4* 8.2*  PROT 7.1 6.4*  --   --   --   ALBUMIN 3.9 3.5  3.5 3.5 3.1*  --   AST 32 32  --   --   --   ALT 20 24  --   --   --   ALKPHOS 105 96  --   --   --   BILITOT 1.1 1.1  --   --   --   GFRNONAA 20* 31*  31* 38* 35* 26*  ANIONGAP 15 13  13 11 11 14      Hematology Recent Labs  Lab 04/03/20 0401 04/04/20 0606 04/06/20 0503  WBC 8.8 11.8* 8.1  RBC 2.91* 3.11* 2.97*  HGB 7.4* 7.9* 7.4*  HCT 25.3* 26.9* 24.8*  MCV 86.9 86.5 83.5  MCH 25.4* 25.4* 24.9*  MCHC 29.2* 29.4* 29.8*  RDW 19.6* 19.4* 19.9*  PLT 347 347 368    BNP Recent Labs  Lab 04/02/20 0148  BNP 636.0*     Radiology    DG Chest 2 View  Result Date: 04/05/2020 CLINICAL  DATA:  BILATERAL pleural effusions, post RIGHT thoracentesis EXAM: CHEST - 2 VIEW COMPARISON:  04/03/2020 FINDINGS: PA view performed with expiratory technique due to preceding thoracentesis. Enlargement of cardiac silhouette with pulmonary vascular congestion. Atherosclerotic calcification aorta. Perihilar infiltrates consistent with pulmonary edema. Minimal residual RIGHT pleural effusion and basilar atelectasis post thoracentesis. No pneumothorax. Moderate LEFT pleural effusion, slightly increased from previous exam. No acute osseous findings. IMPRESSION: Enlargement of cardiac silhouette with pulmonary vascular congestion and mild pulmonary edema. BILATERAL pleural effusions and basilar atelectasis, slightly increased on LEFT as well as decreased on RIGHT post RIGHT thoracentesis. No pneumothorax following RIGHT thoracentesis. Electronically Signed   By: Lavonia Dana M.D.   On: 04/05/2020 13:07   US THORACENTESIS ASP PLEURAL SPACE W/IMG GUIDE  Result Date: 04/05/2020 INDICATION: BILATERAL pleural effusions, shortness of breath, orthopnea EXAM: ULTRASOUND GUIDED DIAGNOSTIC AND THERAPEUTIC RIGHT THORACENTESIS MEDICATIONS: None. COMPLICATIONS: None immediate. PROCEDURE: An ultrasound guided  thoracentesis was thoroughly discussed with the patient and questions answered. The benefits, risks, alternatives and complications were also discussed. The patient understands and wishes to proceed with the procedure. Written consent was obtained. Ultrasound was performed to localize and mark an adequate pocket of fluid in the RIGHT chest. The area was then prepped and draped in the normal sterile fashion. 1% Lidocaine was used for local anesthesia. Under ultrasound guidance a 8 French thoracentesis catheter was introduced. Thoracentesis was performed. The catheter was removed and a dressing applied. FINDINGS: A total of approximately 1.03 L of yellow RIGHT pleural fluid was removed. Samples were sent to the laboratory as requested by the clinical team. IMPRESSION: Successful ultrasound guided RIGHT thoracentesis yielding 1.03 L of pleural fluid. Electronically Signed   By: Lavonia Dana M.D.   On: 04/05/2020 13:06    Cardiac Studies   Echocardiogram 04/02/2020: IMPRESSIONS    1. Hypokinesis of the inferior wall (base, mid). Left ventricular  ejection fraction, by estimation, is 45 to 50%. The left ventricle has  mildly decreased function. There is mild left ventricular hypertrophy.  Left ventricular diastolic parameters are  indeterminate.   2. Right ventricular systolic function is low normal. The right  ventricular size is mildly enlarged. There is moderately elevated  pulmonary artery systolic pressure.   3. Left atrial size was mildly dilated.   4. Right atrial size was moderately dilated.   5. Large pleural effusion.   6. There are a couple MR jets; the largest is eccentric directed  posteriorly along posterior wall of LA COmpared to previous report, MR is  more severe. . The mitral valve is abnormal. Severe mitral valve  regurgitation.   7. Tricuspid valve regurgitation is moderate.   8. The aortic valve is tricuspid. Aortic valve regurgitation is not  visualized. Mild to moderate  aortic valve sclerosis/calcification is  present, without any evidence of aortic stenosis.   9. The inferior vena cava is dilated in size with <50% respiratory  variability, suggesting right atrial pressure of 15 mmHg.   Patient Profile     87 y.o. female  with a history of persistent atrial fibrillation who is being seen for the evaluation of cardiomyopathy with worsening mitral regurgitation and acute systolic heart failure at the request of Dr. 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 Afib rates going fast since cardizem stopped on admission for HR 50's. Now HR better controlled since started on metoprolol. I/O's negative 1.18 L and requiring less O2 and swelling down.  Right thoracentesis yest with 1 L fluid removed. On Lasix 40 mg q 12, Crt.up 1.89.Not putting as much urine out and says it's dark.  For left thoracentesis today. Will hold lasix today and watch Crt.   Severe MR on echo 04/02/20 ? Worse since in CHF. Continue diuresis and re-evaluate as outpatient to see if improves.    Persistent AFib-failed to maintain NSR on Amiodarone and 2 DCCV's. Amiodarone stopped by Dr. Lovena Le earlier this month with plans for 3 day Zio in 3 months to make sure rate controlled. Diltiazem stopped on admission because of bradycardia and now onToprol XL 50 mg with reduced EF. No slow rates and rate better controlled On Eliquis 2.5 mg bid(Crt was 1.6) (was on 5 mg bid PTA). HR's from home reviewed-some in the 50's but most 90-low 100's.   NSVT-14 beats-K was 2.7 but now replaced   HTN BP tends to run low limiting options.   Anemia-Hgb 10.5 02/02/20 now 7.4, could be contributing to SOB.     For questions or updates, please contact [place] Please consult [email or web address] for contact info under        Signed, Ermalinda Barrios, PA-C  04/06/2020, 8:36 AM     Attending note:  Chart reviewed, case discussed with Ms. Vita Barley and also hospitalist team.   Patient is feeling better symptomatically, had right-sided thoracentesis with removal of 1 L yesterday.  Creatinine has bumped up to 1.89 on Lasix which is being cut back.  She will follow up with a left-sided thoracentesis today.  Heart rate generally better controlled in atrial fibrillation since initiation of Toprol-XL.  Low blood pressure still limits further medical therapy additions for cardiomyopathy.  Hospitalist team also plans to transfuse 1 unit PRBC  today with hemoglobin 7.4.  We will continue to follow and make further medication recommendations.  Satira Sark, M.D., F.A.C.C.

## 2020-04-06 NOTE — Plan of Care (Signed)

## 2020-04-07 DIAGNOSIS — I5041 Acute combined systolic (congestive) and diastolic (congestive) heart failure: Secondary | ICD-10-CM | POA: Diagnosis not present

## 2020-04-07 DIAGNOSIS — M1991 Primary osteoarthritis, unspecified site: Secondary | ICD-10-CM | POA: Diagnosis not present

## 2020-04-07 DIAGNOSIS — E039 Hypothyroidism, unspecified: Secondary | ICD-10-CM | POA: Diagnosis not present

## 2020-04-07 DIAGNOSIS — M069 Rheumatoid arthritis, unspecified: Secondary | ICD-10-CM | POA: Diagnosis not present

## 2020-04-07 LAB — RENAL FUNCTION PANEL
Albumin: 3.2 g/dL — ABNORMAL LOW (ref 3.5–5.0)
Anion gap: 17 — ABNORMAL HIGH (ref 5–15)
BUN: 68 mg/dL — ABNORMAL HIGH (ref 8–23)
CO2: 22 mmol/L (ref 22–32)
Calcium: 8.4 mg/dL — ABNORMAL LOW (ref 8.9–10.3)
Chloride: 93 mmol/L — ABNORMAL LOW (ref 98–111)
Creatinine, Ser: 2.77 mg/dL — ABNORMAL HIGH (ref 0.44–1.00)
GFR, Estimated: 16 mL/min — ABNORMAL LOW (ref >60)
Glucose, Bld: 75 mg/dL (ref 70–99)
Phosphorus: 7.3 mg/dL — ABNORMAL HIGH (ref 2.5–4.6)
Potassium: 4.9 mmol/L (ref 3.5–5.1)
Sodium: 132 mmol/L — ABNORMAL LOW (ref 135–145)

## 2020-04-07 LAB — TYPE AND SCREEN
ABO/RH(D): O POS
Antibody Screen: NEGATIVE
Unit division: 0

## 2020-04-07 LAB — BPAM RBC
Blood Product Expiration Date: 202204262359
ISSUE DATE / TIME: 202203291233
Unit Type and Rh: 5100

## 2020-04-07 LAB — CYTOLOGY - NON PAP

## 2020-04-07 LAB — CBC
HCT: 29.7 % — ABNORMAL LOW (ref 36.0–46.0)
Hemoglobin: 8.8 g/dL — ABNORMAL LOW (ref 12.0–15.0)
MCH: 24.3 pg — ABNORMAL LOW (ref 26.0–34.0)
MCHC: 29.6 g/dL — ABNORMAL LOW (ref 30.0–36.0)
MCV: 82 fL (ref 80.0–100.0)
Platelets: 435 10*3/uL — ABNORMAL HIGH (ref 150–400)
RBC: 3.62 MIL/uL — ABNORMAL LOW (ref 3.87–5.11)
RDW: 21.1 % — ABNORMAL HIGH (ref 11.5–15.5)
WBC: 10.5 10*3/uL (ref 4.0–10.5)
nRBC: 1.1 % — ABNORMAL HIGH (ref 0.0–0.2)

## 2020-04-07 LAB — PH, BODY FLUID: pH, Body Fluid: 7.4

## 2020-04-07 MED ORDER — SODIUM CHLORIDE 0.9 % IV SOLN: INTRAVENOUS | Status: AC

## 2020-04-07 NOTE — Progress Notes (Signed)
Patient Demographics:    Erin Schmidt, is a 87 y.o. female, DOB - 02-10-1932, CVE:[id number]  Admit date - 04/02/2020   Admitting Physician Bernadette Hoit, DO  Outpatient Primary MD for the patient is Sharilyn Sites, MD  LOS - 5   Chief Complaint  Patient presents with  . Abdominal Pain  . Shortness of Breath        Subjective:    Erin Schmidt -was seen and examined this morning, stable sitting beside her bed, still on 2 L of oxygen, satting 96%  Complain of generalized weakness unable to ambulate without assist reporting that she was walking without assistance when she was at home.  She is looking forward to be discharged home but she has a 16 year old husband at home she would like to be able to ambulate and do her ADLs before returning.   Assessment  & Plan :    Principal Problem:   Acute on chronic combined systolic and diastolic CHF/H/o dCHF, now with New Systolic CHF Active Problems:   Normocytic anemia   Hypothyroidism   Acute exacerbation of CHF (congestive heart failure) (HCC)   Acute respiratory failure with hypoxia (HCC)   Thrombocytosis   Elevated brain natriuretic peptide (BNP) level   Elevated troponin   Essential hypertension   Acute kidney injury superimposed on CKD (HCC)   Hyperkalemia  Brief Summary:- 87 y.o. female with medical history significant for atrial fibrillation on Eliquis, hypertension, hypothyroidism, diastolic heart failure, rheumatoid arthritis and iron deficiency anemia  admitted on 04/02/2020 with increasing weight gain and acute on chronic diastolic dysfunction CHF exacerbation despite cardiology service increasing  patient's diuretics as outpatient --------------------------------------------------------------------------------------------------------------------------------------------  A/p Acute on chronic combined systolic and diastolic CHF/H/o  dCHF, now with New Systolic CHF---- -Repeat echo shows that MR is now severe, it was previously moderate mitral regurg -Repeat echo also shows moderate TR -Repeat echo with EF down to 45 to 50% from 50-55 % back in October 2021 -Clinical exam, chest x-ray and BNP consistent with CHF exacerbation -Daily weight and fluid input and output monitoring -Repeat chest x-ray bilateral pleural effusion- - s/p Rt ultrasound thoracentesis on 04/05/20 and Lt thora on 04/06/20 -Worsening kidney function, elevated BUN/creatinine--therefore-holding Lasix--give diuretic holiday -Last dose of Lasix was 04/07/2018 in AM (40 mg iv)  -Cardiology consult appreciated Acute Hypoxic Resp Failure:- PTA patient was not on O2 -Oxygen requirement improved patient is down to 2 L of oxygen via nasal cannula from 8 L -Chest x-ray with possible pneumonia--patient without typical pneumonia symptoms, fevers, no leukocytosis and PCT is less than 0.10 -Hold off on antibiotics  -Hypoxia improving with diuresis and bilateral thoracentesis-pleural fluid Gram stain and cultures NGTD -Cardiology recommending to continue with Toprol-XL, with holding ACE inhibitor, ARB and ARNI due to worsening kidney function   AKI----acute kidney injury on CKD stage -3B with hyperkalemia- hyperkalemia resolved with Lokelma -Creatinine was 1.75 on 02/27/2020 - Creatinine peak of 2.31 04/02/2020 -Creatinine  1.89 >> 2.77 -give diuretic holiday as above -Monitor renal function closely while diuresing patient renally adjust medications, avoid nephrotoxic agents / dehydration  / hypotension   Chronic normocytic anemia - suspect anemia of chronic kidney disease,  -Hemoglobin is 7.4--patient with tachycardia, dyspnea, hypoxia we will transfuse 1 unit of  PRBCs on 04/06/2020 >>> improved to 8.8  -On Eliquis but no bleeding concerns noted at this time  Hypothyroidism--stable, c/n levothyroxine 112 mcg daily  Chronic atrial fibrillation -Cardizem  initially discontinued due to bradycardia, however due to drop in EF and tachycardia will use metoprolol XL 50 mg daily  -it is better controlled now, -failed to maintain NSR on Amiodarone and 2 DCCV's. Amiodarone stopped by Dr. Lovena Le earlier this month with plans for 3 day Zio in 3 months to make sure rate controlled -c/n  Eliquis for stroke prophylaxis -Per cardiology recommendation maintaining K > than 4.0, magnesium > than 2.0  Social/Ethics--- patient wants to be a DNI, but she is okay with CPR and ACLS protocols  Disposition/Need for in-Hospital Stay - patient unable to be discharged at this time due to -acute diastolic dysfunction CHF exacerbation requiring IV diuresis and close monitoring of renal function and electrolytes, as well as acute hypoxic respiratory failure requiring oxygen supplementation at this time*   Generalized weakness/debility -Consulting PT/OT for evaluation recommendation -Social work consult for possible home health, DME's    Status is: Inpatient Remains inpatient appropriate because:Please see above   Disposition: The patient is from: Home              Anticipated d/c is to: Home              Anticipated d/c date is: 1 day              Patient currently is not medically stable to d/c. Barriers: Not Clinically Stable-   Procedures:- 04/05/20-right-sided thoracentesis removing 1 L -04/06/20 left-sided thoracentesis with 900 mL removal  Code Status : -  Code Status: Partial Code   Family Communication:    (patient is alert, awake and coherent)  Discussed with husband   Consults  :    DVT Prophylaxis  :   - SCDs   Place TED hose Start: 04/02/20 1307 apixaban (ELIQUIS) tablet 2.5 mg Start: 04/02/20 1000 SCDs Start: 04/02/20 0459 apixaban (ELIQUIS) tablet 2.5 mg    Lab Results  Component Value Date   PLT 435 (H) 04/07/2020   Inpatient Medications  Scheduled Meds: . apixaban  2.5 mg Oral BID  . Chlorhexidine Gluconate Cloth  6 each Topical  Daily  . levothyroxine  112 mcg Oral Q0600  . metoprolol succinate  50 mg Oral Daily  . pantoprazole  40 mg Oral Daily  . polyethylene glycol  17 g Oral Daily   Continuous Infusions: PRN Meds:.acetaminophen **OR** acetaminophen, methocarbamol, ondansetron (ZOFRAN) IV, traMADol  Anti-infectives (From admission, onward)   None        Objective:   Vitals:   04/06/20 2021 04/07/20 0352 04/07/20 0500 04/07/20 0842  BP: (!) 100/43 115/71    Pulse: (!) 51 (!) 48  71  Resp: 20 19    Temp: 97.7 F (36.5 C) 98.4 F (36.9 C)    TempSrc: Oral     SpO2: 95% 96%    Weight:   68.9 kg   Height:        Wt Readings from Last 3 Encounters:  04/07/20 68.9 kg  03/09/20 66 kg  02/27/20 65 kg    Intake/Output Summary (Last 24 hours) at 04/07/2020 1043 Last data filed at 04/07/2020 0900 Gross per 24 hour  Intake 910 ml  Output 150 ml  Net 760 ml      Physical Exam:   General:  Alert, oriented, cooperative, no distress; still on 2 L of oxygen  HEENT:  Normocephalic, PERRL, otherwise with in Normal limits   Neuro:  CNII-XII intact. , normal motor and sensation, reflexes intact   Lungs:   Clear to auscultation BL, Respirations unlabored, no wheezes / crackles  Cardio:    S1/S2, RRR, No murmure, No Rubs or Gallops   Abdomen:   Soft, non-tender, bowel sounds active all four quadrants,  no guarding or peritoneal signs.  Muscular skeletal:   Generalized weaknesses - Limited exam - in bed, able to move all 4 extremities, ,  2+ pulses,  symmetric, +1 pitting edema  Skin:  Dry, warm to touch, negative for any Rashes,  Wounds: Please see nursing documentation          Data Review:   Micro Results Recent Results (from the past 240 hour(s))  Resp Panel by RT-PCR (Flu A&B, Covid) Nasopharyngeal Swab     Status: None   Collection Time: 04/02/20  1:44 AM   Specimen: Nasopharyngeal Swab; Nasopharyngeal(NP) swabs in vial transport medium  Result Value Ref Range Status   SARS Coronavirus 2  by RT PCR NEGATIVE NEGATIVE Final    Comment: (NOTE) SARS-CoV-2 target nucleic acids are NOT DETECTED.  The SARS-CoV-2 RNA is generally detectable in upper respiratory specimens during the acute phase of infection. The lowest concentration of SARS-CoV-2 viral copies this assay can detect is 138 copies/mL. A negative result does not preclude SARS-Cov-2 infection and should not be used as the sole basis for treatment or other patient management decisions. A negative result may occur with  improper specimen collection/handling, submission of specimen other than nasopharyngeal swab, presence of viral mutation(s) within the areas targeted by this assay, and inadequate number of viral copies(<138 copies/mL). A negative result must be combined with clinical observations, patient history, and epidemiological information. The expected result is Negative.  Fact Sheet for Patients:  EntrepreneurPulse.com.au  Fact Sheet for Healthcare Providers:  IncredibleEmployment.be  This test is no t yet approved or cleared by the [place] FDA and  has been authorized for detection and/or diagnosis of SARS-CoV-2 by FDA under an Emergency Use Authorization (EUA). This EUA will remain  in effect (meaning this test can be used) for the duration of the COVID-19 declaration under Section 564(b)(1) of the Act, 21 U.S.C.section 360bbb-3(b)(1), unless the authorization is terminated  or revoked sooner.       Influenza A by PCR NEGATIVE NEGATIVE Final   Influenza B by PCR NEGATIVE NEGATIVE Final    Comment: (NOTE) The Xpert Xpress SARS-CoV-2/FLU/RSV plus assay is intended as an aid in the diagnosis of influenza from Nasopharyngeal swab specimens and should not be used as a sole basis for treatment. Nasal washings and aspirates are unacceptable for Xpert Xpress SARS-CoV-2/FLU/RSV testing.  Fact Sheet for Patients: EntrepreneurPulse.com.au  Fact  Sheet for Healthcare Providers: IncredibleEmployment.be  This test is not yet approved or cleared by the [place] FDA and has been authorized for detection and/or diagnosis of SARS-CoV-2 by FDA under an Emergency Use Authorization (EUA). This EUA will remain in effect (meaning this test can be used) for the duration of the COVID-19 declaration under Section 564(b)(1) of the Act, 21 U.S.C. section 360bbb-3(b)(1), unless the authorization is terminated or revoked.  Performed at [facility], [address] [place], [place] [address]   MRSA PCR Screening     Status: None   Collection Time: 04/02/20  4:47 AM   Specimen: Nasal Mucosa; Nasopharyngeal  Result Value Ref Range Status   MRSA by PCR NEGATIVE NEGATIVE Final  Comment:        The GeneXpert MRSA Assay (FDA approved for NASAL specimens only), is one component of a comprehensive MRSA colonization surveillance program. It is not intended to diagnose MRSA infection nor to guide or monitor treatment for MRSA infections. Performed at [facility], [address] [place], [place] [address]   Culture, body fluid w Gram Stain-bottle     Status: None (Preliminary result)   Collection Time: 04/05/20 12:17 PM   Specimen: Pleura  Result Value Ref Range Status   Specimen Description PLEURAL RIGHT LUNG  Final   Special Requests   Final    BOTTLES DRAWN AEROBIC AND ANAEROBIC Blood Culture adequate volume   Culture   Final    NO GROWTH 2 DAYS Performed at [facility], [address] [place], [place] [address]    Report Status PENDING  Incomplete  Gram stain     Status: None   Collection Time: 04/05/20 12:17 PM   Specimen: Pleura  Result Value Ref Range Status   Specimen Description PLEURAL  Final   Special Requests NONE  Final   Gram Stain   Final    NO ORGANISMS SEEN FEW WBC SEEN WBC PRESENT, PREDOMINANTLY MONONUCLEAR Performed at [facility], [address], [place], [place] [address]    Report Status  04/05/2020 FINAL  Final  Culture, body fluid w Gram Stain-bottle     Status: None (Preliminary result)   Collection Time: 04/06/20 10:00 AM   Specimen: Pleura  Result Value Ref Range Status   Specimen Description PLEURAL  Final   Special Requests BOTTLES DRAWN AEROBIC AND ANAEROBIC 10CC  Final   Culture   Final    NO GROWTH < 24 HOURS Performed at [facility], [address], [place], [place] [address]    Report Status PENDING  Incomplete  Gram stain     Status: None   Collection Time: 04/06/20 10:00 AM   Specimen: Pleura  Result Value Ref Range Status   Specimen Description PLEURAL  Final   Special Requests NONE  Final   Gram Stain   Final    NO ORGANISMS SEEN WBC PRESENT,BOTH PMN AND MONONUCLEAR Performed at [facility], [address] [place],  [address]    Report Status 04/06/2020 FINAL  Final    Radiology Reports DG Chest 2 View  Result Date: 04/06/2020 CLINICAL DATA:  Post LEFT thoracentesis EXAM: CHEST - 2 VIEW COMPARISON:  04/05/2020 FINDINGS: Enlargement of cardiac silhouette with pulmonary vascular congestion. Atherosclerotic calcification aorta. Accentuation of perihilar markings likely reflect mild pulmonary edema. Decreased LEFT pleural effusion post thoracentesis with mild residual LEFT basilar atelectasis. New airspace consolidation in RIGHT middle lobe question pneumonia. No pneumothorax. Bones demineralized. IMPRESSION: Significantly decreased LEFT pleural effusion with mild residual LEFT basilar atelectasis. No pneumothorax following thoracentesis. Enlargement of cardiac silhouette with pulmonary vascular congestion and minimal perihilar edema. New RIGHT middle lobe airspace consolidation question pneumonia. Electronically Signed   By: Lavonia Dana M.D.   On: 04/06/2020 11:08   DG Chest 2 View  Result Date: 04/05/2020 CLINICAL DATA:  BILATERAL pleural effusions, post RIGHT thoracentesis EXAM: CHEST - 2 VIEW COMPARISON:  04/03/2020 FINDINGS: PA view performed  with expiratory technique due to preceding thoracentesis. Enlargement of cardiac silhouette with pulmonary vascular congestion. Atherosclerotic calcification aorta. Perihilar infiltrates consistent with pulmonary edema. Minimal residual RIGHT pleural effusion and basilar atelectasis post thoracentesis. No pneumothorax. Moderate LEFT pleural effusion, slightly increased from previous exam. No acute osseous findings. IMPRESSION: Enlargement of cardiac  silhouette with pulmonary vascular congestion and mild pulmonary edema. BILATERAL pleural effusions and basilar atelectasis, slightly increased on LEFT as well as decreased on RIGHT post RIGHT thoracentesis. No pneumothorax following RIGHT thoracentesis. Electronically Signed   By: Lavonia Dana M.D.   On: 04/05/2020 13:07   DG Chest 2 View  Result Date: 04/03/2020 CLINICAL DATA:  Shortness of breath for 1 week. EXAM: CHEST - 2 VIEW COMPARISON:  04/02/2020 and prior exams FINDINGS: Enlargement of the cardiopericardial silhouette is noted. Moderate bilateral pleural effusions and bilateral LOWER lung atelectasis/consolidation noted. No pneumothorax or acute bony abnormality. IMPRESSION: Enlargement of the cardiopericardial silhouette with moderate bilateral pleural effusions and bilateral LOWER lung atelectasis/consolidation. Electronically Signed   By: Margarette Canada M.D.   On: 04/03/2020 10:47   DG Chest Port 1 View  Result Date: 04/02/2020 CLINICAL DATA:  Shortness of breath for 4-5 days, worsening, COVID testing pending EXAM: PORTABLE CHEST 1 VIEW COMPARISON:  Radiograph 12/01/2019, CT 12/01/2019 FINDINGS: Chronically coarsened interstitial and bronchitic features with new patchy consolidative opacity in the right infrahilar lung and retrocardiac space as well as likely layering bilateral effusions. Mild pulmonary vascular congestion is present. Cardiac size is similar to prior though portions are obscured by overlying opacity. The aorta is calcified. The remaining  cardiomediastinal contours are unremarkable. No other acute osseous or soft tissue abnormality. Degenerative changes are present in the imaged spine and shoulders. Telemetry leads overlie the chest. IMPRESSION: 1. Patchy consolidative opacity in the right infrahilar lung and retrocardiac space with likely layering bilateral effusions, could reflect pneumonia, asymmetric edema or combination there of. 2. Chronically coarsened interstitial and bronchitic features. 3.  Aortic Atherosclerosis (ICD10-I70.0). Electronically Signed   By: Lovena Le M.D.   On: 04/02/2020 02:07   MM 3D SCREEN BREAST BILATERAL  Result Date: 03/24/2020 CLINICAL DATA:  Screening. EXAM: DIGITAL SCREENING BILATERAL MAMMOGRAM WITH TOMOSYNTHESIS AND CAD TECHNIQUE: Bilateral screening digital craniocaudal and mediolateral oblique mammograms were obtained. Bilateral screening digital breast tomosynthesis was performed. The images were evaluated with computer-aided detection. COMPARISON:  Previous exam(s). ACR Breast Density Category b: There are scattered areas of fibroglandular density. FINDINGS: There are no findings suspicious for malignancy. The images were evaluated with computer-aided detection. IMPRESSION: No mammographic evidence of malignancy. A result letter of this screening mammogram will be mailed directly to the patient. RECOMMENDATION: Screening mammogram in one year. (Code:SM-B-01Y) BI-RADS CATEGORY  1: Negative. Electronically Signed   By: Lajean Manes M.D.   On: 03/24/2020 14:23   ECHOCARDIOGRAM LIMITED  Result Date: 04/02/2020    ECHOCARDIOGRAM LIMITED REPORT   Patient Name:   MONIA TIMMERS Date of Exam: 04/02/2020 Medical Rec #:  [id number]         Height:       66.0 in Accession #:    [id number]        Weight:       153.0 lb Date of Birth:  10/10/1932         BSA:          1.785 m Patient Age:    32 years          BP:           95/63 mmHg Patient Gender: F                 HR:           112 bpm. Exam Location:  [facility] Procedure: Limited Echo, Cardiac Doppler and Color Doppler Indications:  CHF-Acute Diastolic  History:        Patient has prior history of Echocardiogram examinations, most                 recent 10/16/2019. CHF, Arrythmias:Atrial Fibrillation,                 Signs/Symptoms:Shortness of Breath; Risk Factors:Hypertension                 and Former Smoker. DOE. Orthopnea. Elevated troponin.  Sonographer:    Clayton Lefort RDCS (AE) Referring Phys: [id number] OLADAPO ADEFESO IMPRESSIONS  1. Hypokinesis of the inferior wall (base, mid). Left ventricular ejection fraction, by estimation, is 45 to 50%. The left ventricle has mildly decreased function. There is mild left ventricular hypertrophy. Left ventricular diastolic parameters are indeterminate.  2. Right ventricular systolic function is low normal. The right ventricular size is mildly enlarged. There is moderately elevated pulmonary artery systolic pressure.  3. Left atrial size was mildly dilated.  4. Right atrial size was moderately dilated.  5. Large pleural effusion.  6. There are a couple MR jets; the largest is eccentric directed posteriorly along posterior wall of LA COmpared to previous report, MR is more severe. . The mitral valve is abnormal. Severe mitral valve regurgitation.  7. Tricuspid valve regurgitation is moderate.  8. The aortic valve is tricuspid. Aortic valve regurgitation is not visualized. Mild to moderate aortic valve sclerosis/calcification is present, without any evidence of aortic stenosis.  9. The inferior vena cava is dilated in size with <50% respiratory variability, suggesting right atrial pressure of 15 mmHg. FINDINGS  Left Ventricle: Hypokinesis of the inferior wall (base, mid). Left ventricular ejection fraction, by estimation, is 45 to 50%. The left ventricle has mildly decreased function. The left ventricular internal cavity size was normal in size. There is mild left ventricular hypertrophy. Left ventricular diastolic parameters  are indeterminate. Right Ventricle: The right ventricular size is mildly enlarged. Right vetricular wall thickness was not assessed. Right ventricular systolic function is low normal. There is moderately elevated pulmonary artery systolic pressure. The tricuspid regurgitant velocity is 3.15 m/s, and with an assumed right atrial pressure of 15 mmHg, the estimated right ventricular systolic pressure is 47.8 mmHg. Left Atrium: Left atrial size was mildly dilated. Right Atrium: Right atrial size was moderately dilated. Pericardium: Trivial pericardial effusion is present. Mitral Valve: There are a couple MR jets; the largest is eccentric directed posteriorly along posterior wall of LA COmpared to previous report, MR is more severe. The mitral valve is abnormal. There is moderate thickening of the mitral valve leaflet(s). Mild mitral annular calcification. Severe mitral valve regurgitation. Tricuspid Valve: The tricuspid valve is normal in structure. Tricuspid valve regurgitation is moderate. Aortic Valve: The aortic valve is tricuspid. Aortic valve regurgitation is not visualized. Mild to moderate aortic valve sclerosis/calcification is present, without any evidence of aortic stenosis. Pulmonic Valve: The pulmonic valve was not well visualized. Pulmonic valve regurgitation is not visualized. Aorta: The aortic root and ascending aorta are structurally normal, with no evidence of dilitation. Venous: The inferior vena cava is dilated in size with less than 50% respiratory variability, suggesting right atrial pressure of 15 mmHg. IAS/Shunts: No atrial level shunt detected by color flow Doppler. Additional Comments: There is a large pleural effusion. LEFT VENTRICLE PLAX 2D LVIDd:         4.71 cm LVIDs:         3.62 cm LV PW:  1.29 cm LV IVS:        1.35 cm LVOT diam:     1.80 cm LVOT Area:     2.54 cm  LEFT ATRIUM         Index LA diam:    4.90 cm 2.75 cm/m   AORTA Ao Root diam: 2.80 cm Ao Asc diam:  3.30 cm MR Peak  grad:    97.2 mmHg   TRICUSPID VALVE MR Mean grad:    61.0 mmHg   TR Peak grad:   39.7 mmHg MR Vmax:         493.00 cm/s TR Vmax:        315.00 cm/s MR Vmean:        359.0 cm/s MR PISA:         3.08 cm    SHUNTS MR PISA Eff ROA: 24 mm      Systemic Diam: 1.80 cm MR PISA Radius:  0.70 cm Dorris Carnes MD Electronically signed by Dorris Carnes MD Signature Date/Time: 04/02/2020/5:26:49 PM    Final    US THORACENTESIS ASP PLEURAL SPACE W/IMG GUIDE  Result Date: 04/06/2020 INDICATION: Diffuse LEFT pleural effusion EXAM: ULTRASOUND GUIDED DIAGNOSTIC AND THERAPEUTIC LEFT THORACENTESIS MEDICATIONS: None. COMPLICATIONS: None immediate PROCEDURE: An ultrasound guided thoracentesis was thoroughly discussed with the patient and questions answered. The benefits, risks, alternatives and complications were also discussed. The patient understands and wishes to proceed with the procedure. Written consent was obtained. Ultrasound was performed to localize and mark an adequate pocket of fluid in the LEFT chest. The area was then prepped and draped in the normal sterile fashion. 1% Lidocaine was used for local anesthesia. Under ultrasound guidance a 8 French thoracentesis catheter was introduced. Thoracentesis was performed. The catheter was removed and a dressing applied. Procedure tolerated well by patient without immediate complication. FINDINGS: A total of approximately 900 mL of clear yellow LEFT pleural fluid was removed. Samples were sent to the laboratory as requested by the clinical team. IMPRESSION: Successful ultrasound guided LEFT thoracentesis yielding 900 mL of pleural fluid. Electronically Signed   By: Lavonia Dana M.D.   On: 04/06/2020 11:39   US THORACENTESIS ASP PLEURAL SPACE W/IMG GUIDE  Result Date: 04/05/2020 INDICATION: BILATERAL pleural effusions, shortness of breath, orthopnea EXAM: ULTRASOUND GUIDED DIAGNOSTIC AND THERAPEUTIC RIGHT THORACENTESIS MEDICATIONS: None. COMPLICATIONS: None immediate. PROCEDURE: An  ultrasound guided thoracentesis was thoroughly discussed with the patient and questions answered. The benefits, risks, alternatives and complications were also discussed. The patient understands and wishes to proceed with the procedure. Written consent was obtained. Ultrasound was performed to localize and mark an adequate pocket of fluid in the RIGHT chest. The area was then prepped and draped in the normal sterile fashion. 1% Lidocaine was used for local anesthesia. Under ultrasound guidance a 8 French thoracentesis catheter was introduced. Thoracentesis was performed. The catheter was removed and a dressing applied. FINDINGS: A total of approximately 1.03 L of yellow RIGHT pleural fluid was removed. Samples were sent to the laboratory as requested by the clinical team. IMPRESSION: Successful ultrasound guided RIGHT thoracentesis yielding 1.03 L of pleural fluid. Electronically Signed   By: Lavonia Dana M.D.   On: 04/05/2020 13:06     CBC Recent Labs  Lab 04/02/20 0148 04/03/20 0401 04/04/20 0606 04/06/20 0503 04/07/20 0500  WBC 9.8 8.8 11.8* 8.1 10.5  HGB 8.0* 7.4* 7.9* 7.4* 8.8*  HCT 27.5* 25.3* 26.9* 24.8* 29.7*  PLT 463* 347 347 368 435*  MCV 88.4 86.9 86.5  83.5 82.0  MCH 25.7* 25.4* 25.4* 24.9* 24.3*  MCHC 29.1* 29.2* 29.4* 29.8* 29.6*  RDW 19.5* 19.6* 19.4* 19.9* 21.1*  LYMPHSABS 1.9  --   --   --   --   MONOABS 1.2*  --   --   --   --   EOSABS 0.1  --   --   --   --   BASOSABS 0.1  --   --   --   --     Chemistries  Recent Labs  Lab 04/02/20 0148 04/03/20 0401 04/04/20 0541 04/05/20 0438 04/06/20 0503 04/07/20 0500  NA 135 138  136 136 135 133* 132*  K 5.7* 2.7*  2.7* 3.6 4.3 4.0 4.9  CL 99 98  98 99 97* 96* 93*  CO2 21* 27  25 26 27 23 22   GLUCOSE 133* 97  97 109* 114* 77 75  BUN 36* 32*  33* 28* 32* 49* 68*  CREATININE 2.37* 1.61*  1.60* 1.36* 1.45* 1.89* 2.77*  CALCIUM 8.7* 8.3*  8.2* 8.5* 8.4* 8.2* 8.4*  MG  --   --   --   --  2.5*  --   AST 32 32  --    --   --   --   ALT 20 24  --   --   --   --   ALKPHOS 105 96  --   --   --   --   BILITOT 1.1 1.1  --   --   --   --    ------------------------------------------------------------------------------------------------------------------ No results for input(s): CHOL, HDL, LDLCALC, TRIG, CHOLHDL, LDLDIRECT in the last 72 hours.  No results found for: HGBA1C ------------------------------------------------------------------------------------------------------------------ No results for input(s): TSH, T4TOTAL, T3FREE, THYROIDAB in the last 72 hours.  Invalid input(s): FREET3 ------------------------------------------------------------------------------------------------------------------ No results for input(s): VITAMINB12, FOLATE, FERRITIN, TIBC, IRON, RETICCTPCT in the last 72 hours.  Coagulation profile Recent Labs  Lab 04/03/20 0401  INR 2.0*    No results for input(s): DDIMER in the last 72 hours.  Cardiac Enzymes No results for input(s): CKMB, TROPONINI, MYOGLOBIN in the last 168 hours.  Invalid input(s): CK ------------------------------------------------------------------------------------------------------------------    Component Value Date/Time   BNP 636.0 (H) 04/02/2020 0148    Deatra James M.D on 04/07/2020 at 10:43 AM  Go to [email or web address] - for contact info  [facility] - Office  [phone]

## 2020-04-07 NOTE — Progress Notes (Signed)
SATURATION QUALIFICATIONS: (This note is used to comply with regulatory documentation for home oxygen)  Patient Saturations on Room Air at Rest = 98% Room Air   Patient Saturations on Room Air while Ambulating = 88% Room Air   Patient Saturations on 2 Liters of oxygen while Ambulating = 100% at 2L per Nasal Canula .   Please briefly explain why patient needs home oxygen:

## 2020-04-07 NOTE — Plan of Care (Signed)

## 2020-04-07 NOTE — Progress Notes (Addendum)
Progress Note  Patient Name: Erin Schmidt Date of Encounter: 04/07/2020  [facility] Cardiologist: Carlyle Dolly, MD   Subjective   Breathing significantly improved. No chest pain or palpitations overnight or this morning.   Inpatient Medications    Scheduled Meds: . apixaban  2.5 mg Oral BID  . Chlorhexidine Gluconate Cloth  6 each Topical Daily  . levothyroxine  112 mcg Oral Q0600  . metoprolol succinate  50 mg Oral Daily  . pantoprazole  40 mg Oral Daily  . polyethylene glycol  17 g Oral Daily    PRN Meds: acetaminophen **OR** acetaminophen, methocarbamol, ondansetron (ZOFRAN) IV, traMADol   Vital Signs    Vitals:   04/06/20 1515 04/06/20 2021 04/07/20 0352 04/07/20 0500  BP: 91/61 (!) 100/43 115/71   Pulse: (!) 55 (!) 51 (!) 48   Resp:  20 19   Temp: (!) 97.5 F (36.4 C) 97.7 F (36.5 C) 98.4 F (36.9 C)   TempSrc: Oral Oral    SpO2: 94% 95% 96%   Weight:    68.9 kg  Height:        Intake/Output Summary (Last 24 hours) at 04/07/2020 0801 Last data filed at 04/06/2020 1730 Gross per 24 hour  Intake 1030 ml  Output 150 ml  Net 880 ml   Last 3 Weights 04/07/2020 04/04/2020 04/03/2020  Weight (lbs) 151 lb 14.4 oz 149 lb 8 oz 152 lb 1.9 oz  Weight (kg) 68.9 kg 67.813 kg 69 kg      Telemetry    Atrial fibrillation, HR in 50's to 70's. PVC's and up to 6 beats NSVT - Personally Reviewed  ECG   No new tracings.   Physical Exam   GEN: No acute distress.   Neck: No JVD Cardiac: Irregularly irregular, 2/6 systolic murmur along Apex.  Respiratory: Mildly decreased breath sounds along bases bilaterally GI: Soft, nontender, non-distended  MS: 1+ pitting edema; No deformity. Neuro:  Nonfocal  Psych: Normal affect   Labs    High Sensitivity Troponin:   Recent Labs  Lab 04/02/20 0148 04/02/20 0348 04/02/20 0529 04/02/20 0755  TROPONINIHS 20* 21* 19* 17      Chemistry Recent Labs  Lab 04/02/20 0148 04/03/20 0401 04/04/20 0541  04/05/20 0438 04/06/20 0503 04/07/20 0500  NA 135 138  136 136 135 133* 132*  K 5.7* 2.7*  2.7* 3.6 4.3 4.0 4.9  CL 99 98  98 99 97* 96* 93*  CO2 21* 27  25 26 27 23 22   GLUCOSE 133* 97  97 109* 114* 77 75  BUN 36* 32*  33* 28* 32* 49* 68*  CREATININE 2.37* 1.61*  1.60* 1.36* 1.45* 1.89* 2.77*  CALCIUM 8.7* 8.3*  8.2* 8.5* 8.4* 8.2* 8.4*  PROT 7.1 6.4*  --   --   --   --   ALBUMIN 3.9 3.5  3.5 3.5 3.1*  --  3.2*  AST 32 32  --   --   --   --   ALT 20 24  --   --   --   --   ALKPHOS 105 96  --   --   --   --   BILITOT 1.1 1.1  --   --   --   --   GFRNONAA 20* 31*  31* 38* 35* 26* 16*  ANIONGAP 15 13  13 11 11 14  17*     Hematology Recent Labs  Lab 04/04/20 0606 04/06/20 0503 04/07/20 0500  WBC  11.8* 8.1 10.5  RBC 3.11* 2.97* 3.62*  HGB 7.9* 7.4* 8.8*  HCT 26.9* 24.8* 29.7*  MCV 86.5 83.5 82.0  MCH 25.4* 24.9* 24.3*  MCHC 29.4* 29.8* 29.6*  RDW 19.4* 19.9* 21.1*  PLT 347 368 435*    BNP Recent Labs  Lab 04/02/20 0148  BNP 636.0*     Radiology    DG Chest 2 View  Result Date: 04/06/2020 CLINICAL DATA:  Post LEFT thoracentesis EXAM: CHEST - 2 VIEW COMPARISON:  04/05/2020 FINDINGS: Enlargement of cardiac silhouette with pulmonary vascular congestion. Atherosclerotic calcification aorta. Accentuation of perihilar markings likely reflect mild pulmonary edema. Decreased LEFT pleural effusion post thoracentesis with mild residual LEFT basilar atelectasis. New airspace consolidation in RIGHT middle lobe question pneumonia. No pneumothorax. Bones demineralized. IMPRESSION: Significantly decreased LEFT pleural effusion with mild residual LEFT basilar atelectasis. No pneumothorax following thoracentesis. Enlargement of cardiac silhouette with pulmonary vascular congestion and minimal perihilar edema. New RIGHT middle lobe airspace consolidation question pneumonia. Electronically Signed   By: Lavonia Dana M.D.   On: 04/06/2020 11:08   DG Chest 2 View  Result Date:  04/05/2020 CLINICAL DATA:  BILATERAL pleural effusions, post RIGHT thoracentesis EXAM: CHEST - 2 VIEW COMPARISON:  04/03/2020 FINDINGS: PA view performed with expiratory technique due to preceding thoracentesis. Enlargement of cardiac silhouette with pulmonary vascular congestion. Atherosclerotic calcification aorta. Perihilar infiltrates consistent with pulmonary edema. Minimal residual RIGHT pleural effusion and basilar atelectasis post thoracentesis. No pneumothorax. Moderate LEFT pleural effusion, slightly increased from previous exam. No acute osseous findings. IMPRESSION: Enlargement of cardiac silhouette with pulmonary vascular congestion and mild pulmonary edema. BILATERAL pleural effusions and basilar atelectasis, slightly increased on LEFT as well as decreased on RIGHT post RIGHT thoracentesis. No pneumothorax following RIGHT thoracentesis. Electronically Signed   By: Lavonia Dana M.D.   On: 04/05/2020 13:07   US THORACENTESIS ASP PLEURAL SPACE W/IMG GUIDE  Result Date: 04/06/2020 INDICATION: Diffuse LEFT pleural effusion EXAM: ULTRASOUND GUIDED DIAGNOSTIC AND THERAPEUTIC LEFT THORACENTESIS MEDICATIONS: None. COMPLICATIONS: None immediate PROCEDURE: An ultrasound guided thoracentesis was thoroughly discussed with the patient and questions answered. The benefits, risks, alternatives and complications were also discussed. The patient understands and wishes to proceed with the procedure. Written consent was obtained. Ultrasound was performed to localize and mark an adequate pocket of fluid in the LEFT chest. The area was then prepped and draped in the normal sterile fashion. 1% Lidocaine was used for local anesthesia. Under ultrasound guidance a 8 French thoracentesis catheter was introduced. Thoracentesis was performed. The catheter was removed and a dressing applied. Procedure tolerated well by patient without immediate complication. FINDINGS: A total of approximately 900 mL of clear yellow LEFT pleural  fluid was removed. Samples were sent to the laboratory as requested by the clinical team. IMPRESSION: Successful ultrasound guided LEFT thoracentesis yielding 900 mL of pleural fluid. Electronically Signed   By: Lavonia Dana M.D.   On: 04/06/2020 11:39   US THORACENTESIS ASP PLEURAL SPACE W/IMG GUIDE  Result Date: 04/05/2020 INDICATION: BILATERAL pleural effusions, shortness of breath, orthopnea EXAM: ULTRASOUND GUIDED DIAGNOSTIC AND THERAPEUTIC RIGHT THORACENTESIS MEDICATIONS: None. COMPLICATIONS: None immediate. PROCEDURE: An ultrasound guided thoracentesis was thoroughly discussed with the patient and questions answered. The benefits, risks, alternatives and complications were also discussed. The patient understands and wishes to proceed with the procedure. Written consent was obtained. Ultrasound was performed to localize and mark an adequate pocket of fluid in the RIGHT chest. The area was then prepped and draped in the normal sterile fashion. 1%  Lidocaine was used for local anesthesia. Under ultrasound guidance a 8 French thoracentesis catheter was introduced. Thoracentesis was performed. The catheter was removed and a dressing applied. FINDINGS: A total of approximately 1.03 L of yellow RIGHT pleural fluid was removed. Samples were sent to the laboratory as requested by the clinical team. IMPRESSION: Successful ultrasound guided RIGHT thoracentesis yielding 1.03 L of pleural fluid. Electronically Signed   By: Lavonia Dana M.D.   On: 04/05/2020 13:06    Cardiac Studies   Limited Echocardiogram: 03/2020 IMPRESSIONS   1. Hypokinesis of the inferior wall (base, mid). Left ventricular  ejection fraction, by estimation, is 45 to 50%. The left ventricle has  mildly decreased function. There is mild left ventricular hypertrophy.  Left ventricular diastolic parameters are  indeterminate.  2. Right ventricular systolic function is low normal. The right  ventricular size is mildly enlarged. There is  moderately elevated  pulmonary artery systolic pressure.  3. Left atrial size was mildly dilated.  4. Right atrial size was moderately dilated.  5. Large pleural effusion.  6. There are a couple MR jets; the largest is eccentric directed  posteriorly along posterior wall of LA COmpared to previous report, MR is  more severe. . The mitral valve is abnormal. Severe mitral valve  regurgitation.  7. Tricuspid valve regurgitation is moderate.  8. The aortic valve is tricuspid. Aortic valve regurgitation is not  visualized. Mild to moderate aortic valve sclerosis/calcification is  present, without any evidence of aortic stenosis.  9. The inferior vena cava is dilated in size with <50% respiratory  variability, suggesting right atrial pressure of 15 mmHg.  Patient Profile     87 y.o. female with past medical historyof persistent atrial fibrillation(s/p DCCV in 11/2019 with recurrence at follow-up, repeat DCCV in 02/2020 following initiation of Amiodarone and recurrence at follow-up --> rate-control pursued since),moderate MR,HTN, hypothyroidism and iron deficiency anemiawho is currently admitted for an acute CHF exacerbation.   Assessment & Plan    1. 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  - She was initially receiving IV Lasix 40mg  BID (wrote as Q8H but doses were held) and this was reduced to daily dosing yesterday given rise in creatinine. Did receive 40mg  at 0845 yesterday but no evening doses. Underwent right thoracentesis on 3/28 with 1.03 L of fluid removed and left thoracentesis on 3/29 with 900 mL of fluid removed. I&O's not fully recorded and weight has been variable (listed as 145 and 153 lbs on the day of admission and at 151 lbs today). Given rise in creatinine to 2.77,  would hold additional diuretic therapy today. If renal function continues to worsen despite holding diuretic therapy, would recommend Nephrology consult. - Continue Toprol-XL for her cardiomyopathy. No ACE-I/ARB/ARNI due to variable renal function and soft BP.   2. Permanent Atrial Fibrillation - Rates have overall been well-controlled in the 50's to 60's. Was previously on Cardizem but this has now been discontinued given her decline in EF. Would continue Toprol-XL 50mg  daily. She does have occasional PVC's and episodes of NSVT (longest 6 beats within the past 24 hours) so would keep K+ ~ 4.0 and Mg ~ 2.0. K+ at 4.9 this AM and Mg 2.5 on 3/29. - No reports of active bleeding. Eliquis has been reduced to 2.5mg  BID given her renal function  this admission.   3. Mitral Regurgitation - Severe by repeat echo this admission. Plans for repeat imaging as an outpatient once fluid status has improved.   4. AKI - Baseline creatinine 1.2 - 1.3 but was elevated to 2.30 in 01/2020. Improved to 1.36 as of 3/27, now at 2.77. Hold diuretic therapy as outlined above.   5. Anemia - Hgb at 7.4 on 3/29 and she received 1 unit pRBC's with Hgb up to 8.8 this AM. Further management per the admitting team.   6. Weakness - She feels very weak from being in the hospital for 5 days. Will request PT evaluation.   For questions or updates, please contact [place] Please consult [email or web address] for contact info under     Signed, Erma Heritage, PA-C  04/07/2020, 8:01 AM     Attending note:  Patient seen and examined.  Case discussed with Ms. Ahmed Prima PA-C, I agree with her above findings.  Ms. Daughtrey states that she slept very well last night after a sleeping pill.  Breathing generally improved as well, no chest pain or palpitations.  She had a left sided thoracentesis yesterday with removal of 900 cc.  Creatinine has bumped up to 2.77 today and Lasix discontinued completely.  Otherwise heart rate control is  adequate in atrial fibrillation on Toprol-XL, she remains on low-dose Eliquis.  Pertinent lab work includes potassium 4.9, BUN 68, creatinine 2.77, hemoglobin 8.8 up from 7.4 after PRBC transfusion, platelets 435.  Continue current doses of Toprol-XL and Eliquis.  Increase activity, could consider PT evaluation.  Will reassess tomorrow and recheck BMET.  Satira Sark, M.D., F.A.C.C.

## 2020-04-07 NOTE — Plan of Care (Signed)
  Problem: Acute Rehab PT Goals(only PT should resolve) Goal: Pt Will Go Supine/Side To Sit Outcome: Progressing Flowsheets (Taken 04/07/2020 1359) Pt will go Supine/Side to Sit: Independently Goal: Patient Will Transfer Sit To/From Stand Outcome: Progressing Flowsheets (Taken 04/07/2020 1359) Patient will transfer sit to/from stand:  with modified independence  with supervision Goal: Pt Will Transfer Bed To Chair/Chair To Bed Outcome: Progressing Flowsheets (Taken 04/07/2020 1359) Pt will Transfer Bed to Chair/Chair to Bed:  with modified independence  with supervision Goal: Pt Will Ambulate Outcome: Progressing Flowsheets (Taken 04/07/2020 1359) Pt will Ambulate:  100 feet  with modified independence  with supervision  with rolling walker  with cane   2:00 PM, 04/07/20 Lonell Grandchild, MPT Physical Therapist with [facility] [phone] office [phone] mobile phone

## 2020-04-07 NOTE — Evaluation (Signed)
Physical Therapy Evaluation Patient Details Name: Erin Schmidt MRN: [id number] DOB: 1932-07-15 Today's Date: 04/07/2020   History of Present Illness  Erin Schmidt is a 87 y.o. female with medical history significant for  atrial fibrillation on Eliquis, hypertension, hypothyroidism, diastolic heart failure, rheumatoid arthritis and iron deficiency anemia who presents to the emergency department due to 1 week onset of increasing shortness of breath.  Patient states that she noted that she gained about 3 pounds, so she increased her torsemide dose which resulted in a subsequent loss of 4 pounds and improved breathing, so she went back to her normal prescribed dose and this resulted in regaining lost weight along with worsening shortness of breath which worsens on exertion and lying flat.  She endorsed increased leg swelling and increased abdominal girth, she also complained of nausea which was not relieved with Zofran.  She complained of some chest tightness which she thinks was due to increased efforts to breathe.  She denies fever, chills, cough or diarrhea.  EMS was activated, and on arrival of EMS, O2 sat was 89-90% on room air, supplemental oxygen via [place] was provided in route to the ED.  Of note, patient does not use oxygen at baseline.    Clinical Impression  Patient functioning near baseline for functional mobility and gait.  Patient demonstrates good return for bed mobility and transfers, has to walk slower when not using AD due to generalized weakness, good return for using RW without loss of balance during ambulation in room/hallway, limited mostly due to fatigue and on room with SpO2 dropping from 96% to 88% during ambulation, recovered to 98% after returning to room.  Patient tolerated sitting up at bedside to eat lunch after therapy.  Patient will benefit from continued physical therapy in hospital and recommended venue below to increase strength, balance, endurance for safe ADLs and  gait.     Follow Up Recommendations Home health PT;Supervision for mobility/OOB;Supervision - Intermittent    Equipment Recommendations  Rolling walker with 5" wheels    Recommendations for Other Services       Precautions / Restrictions Precautions Precautions: Fall Restrictions Weight Bearing Restrictions: No      Mobility  Bed Mobility Overal bed mobility: Modified Independent                  Transfers Overall transfer level: Needs assistance   Transfers: Sit to/from Stand;Stand Pivot Transfers Sit to Stand: Supervision Stand pivot transfers: Supervision       General transfer comment: increased time, labored movement  Ambulation/Gait Ambulation/Gait assistance: Supervision Gait Distance (Feet): 80 Feet Assistive device: Rolling walker (2 wheeled);None Gait Pattern/deviations: Decreased step length - right;Decreased stride length Gait velocity: decreased   General Gait Details: slow labored cadence, required use of RW to increase endurance demonstrating labored cadence with occasional standing rest breaks due to fatigue on room air with SpO2 dropping from 96% to 88%, recovered to 98% after returning to room  Stairs            Wheelchair Mobility    Modified Rankin (Stroke Patients Only)       Balance Overall balance assessment: Needs assistance Sitting-balance support: Feet supported;No upper extremity supported Sitting balance-Leahy Scale: Good Sitting balance - Comments: seated at EOB   Standing balance support: During functional activity;No upper extremity supported Standing balance-Leahy Scale: Fair Standing balance comment: fair/good using RW  Pertinent Vitals/Pain Pain Assessment: No/denies pain    Home Living Family/patient expects to be discharged to:: Private residence Living Arrangements: Spouse/significant other Available Help at Discharge: Family;Available 24 hours/day Type of  Home: House Home Access: Stairs to enter Entrance Stairs-Rails: Right Entrance Stairs-Number of Steps: 4 Home Layout: One level Home Equipment: Cane - single point      Prior Function Level of Independence: Independent         Comments: Hydrographic surveyor, drives     Hand Dominance   Dominant Hand: Right    Extremity/Trunk Assessment   Upper Extremity Assessment Upper Extremity Assessment: Overall WFL for tasks assessed    Lower Extremity Assessment Lower Extremity Assessment: Generalized weakness    Cervical / Trunk Assessment Cervical / Trunk Assessment: Normal  Communication   Communication: No difficulties  Cognition Arousal/Alertness: Awake/alert Behavior During Therapy: WFL for tasks assessed/performed Overall Cognitive Status: Within Functional Limits for tasks assessed                                        General Comments      Exercises     Assessment/Plan    PT Assessment Patient needs continued PT services  PT Problem List Decreased strength;Decreased activity tolerance;Decreased mobility;Decreased balance       PT Treatment Interventions DME instruction;Gait training;Stair training;Functional mobility training;Therapeutic activities;Therapeutic exercise;Patient/family education;Balance training    PT Goals (Current goals can be found in the Care Plan section)  Acute Rehab PT Goals Patient Stated Goal: return home with family to assist PT Goal Formulation: With patient Time For Goal Achievement: 04/09/20 Potential to Achieve Goals: Good    Frequency     Barriers to discharge        Co-evaluation               AM-PAC PT "6 Clicks" Mobility  Outcome Measure Help needed turning from your back to your side while in a flat bed without using bedrails?: None Help needed moving from lying on your back to sitting on the side of a flat bed without using bedrails?: None Help needed moving to and from a bed to a chair  (including a wheelchair)?: A Little Help needed standing up from a chair using your arms (e.g., wheelchair or bedside chair)?: None Help needed to walk in hospital room?: A Little Help needed climbing 3-5 steps with a railing? : A Little 6 Click Score: 21    End of Session   Activity Tolerance: Patient tolerated treatment well;Patient limited by fatigue Patient left: in bed;with call bell/phone within reach Nurse Communication: Mobility status PT Visit Diagnosis: Unsteadiness on feet (R26.81);Other abnormalities of gait and mobility (R26.89);Muscle weakness (generalized) (M62.81)    Time: 2355-7322 PT Time Calculation (min) (ACUTE ONLY): 27 min   Charges:   PT Evaluation $PT Eval Moderate Complexity: 1 Mod PT Treatments $Therapeutic Activity: 23-37 mins        1:58 PM, 04/07/20 Lonell Grandchild, MPT Physical Therapist with [facility] [phone] office [phone] mobile phone

## 2020-04-08 DIAGNOSIS — I4821 Permanent atrial fibrillation: Secondary | ICD-10-CM

## 2020-04-08 DIAGNOSIS — N179 Acute kidney failure, unspecified: Secondary | ICD-10-CM

## 2020-04-08 DIAGNOSIS — N1832 Chronic kidney disease, stage 3b: Secondary | ICD-10-CM

## 2020-04-08 DIAGNOSIS — I5041 Acute combined systolic (congestive) and diastolic (congestive) heart failure: Secondary | ICD-10-CM | POA: Diagnosis not present

## 2020-04-08 LAB — BASIC METABOLIC PANEL
Anion gap: 14 (ref 5–15)
BUN: 84 mg/dL — ABNORMAL HIGH (ref 8–23)
CO2: 24 mmol/L (ref 22–32)
Calcium: 8.1 mg/dL — ABNORMAL LOW (ref 8.9–10.3)
Chloride: 92 mmol/L — ABNORMAL LOW (ref 98–111)
Creatinine, Ser: 2.91 mg/dL — ABNORMAL HIGH (ref 0.44–1.00)
GFR, Estimated: 15 mL/min — ABNORMAL LOW (ref >60)
Glucose, Bld: 100 mg/dL — ABNORMAL HIGH (ref 70–99)
Potassium: 4.7 mmol/L (ref 3.5–5.1)
Sodium: 130 mmol/L — ABNORMAL LOW (ref 135–145)

## 2020-04-08 NOTE — Progress Notes (Signed)
Patient Demographics:    Erin Schmidt, is a 87 y.o. female, DOB - 1932-08-28, VXB:[id number]  Admit date - 04/02/2020   Admitting Physician Bernadette Hoit, DO  Outpatient Primary MD for the patient is Sharilyn Sites, MD  LOS - 6   Chief Complaint  Patient presents with  . Abdominal Pain  . Shortness of Breath        Subjective:    Erin Schmidt -was seen and examined this morning, pleasant.  Sitting in chair, noting mild worsening swelling in lower extremities... Still complaining of generalized weaknesses but participating with PT Denies of having any shortness of breath or chest pain.  Informed that her kidney function is worse, willing to see a nephrologist   Assessment  & Plan :    Principal Problem:   Acute on chronic combined systolic and diastolic CHF/H/o dCHF, now with New Systolic CHF Active Problems:   Normocytic anemia   Hypothyroidism   Acute exacerbation of CHF (congestive heart failure) (HCC)   Acute respiratory failure with hypoxia (HCC)   Thrombocytosis   Elevated brain natriuretic peptide (BNP) level   Elevated troponin   Essential hypertension   Acute kidney injury superimposed on CKD (HCC)   Hyperkalemia  Brief Summary:- 87 y.o. female with medical history significant for atrial fibrillation on Eliquis, hypertension, hypothyroidism, diastolic heart failure, rheumatoid arthritis and iron deficiency anemia  admitted on 04/02/2020 with increasing weight gain and acute on chronic diastolic dysfunction CHF exacerbation despite cardiology service increasing  patient's diuretics as outpatient --------------------------------------------------------------------------------------------------------------------------------------------  A/p  Acute on chronic combined systolic and diastolic CHF/H/o dCHF, now with New Systolic CHF---- -improved shortness of breath, requiring  oxygen on exertion -Continue to have lower extremity edema -We will holding Lasix due to worsening kidney function   -Repeat echo shows that MR is now severe, it was previously moderate mitral regurg -Repeat echo also shows moderate TR -Repeat echo with EF down to 45 to 50% from 50-55 % back in October 2021 -Clinical exam, chest x-ray and BNP consistent with CHF exacerbation -Daily weight and fluid input and output monitoring -Repeat chest x-ray bilateral pleural effusion- - s/p Rt ultrasound thoracentesis on 04/05/20 and Lt thora on 04/06/20  -Last dose of Lasix was 04/07/2018 in AM (40 mg iv)   -Cardiology following closely, recommending continue Eliquis, Lexxel, ring withholding 6 due to worsening kidney function   Acute Hypoxic Resp Failure:- PTA patient was not on O2 -O2 demand improved from 8 L to 2 L, on room air at rest, needing 2 L with exertion -Chest x-ray possible pneumonia--off antibiotics -Improved hypoxia-with diuretics - S/P bilateral thoracentesis-pleural fluid Gram stain and cultures NGTD -Cardiology recommending to continue with Toprol-XL, with holding ACE inhibitor, ARB and ARNI due to worsening kidney function   Acute kidney injury on CKD stage -3B with hyperkalemia- hyperkalemia resolved with Lokelma -Possible developing cardiorenal syndrome -Creatinine 1.75, 1.89 >> 2.77 >> 2.91 today BUN 49, 68, 84 today -Lasix on hold -Consulting nephrology -appreciate their input (Kidney function likely exacerbated by diuretics, hypotension, hypovolemia.Marland Kitchen)   Chronic normocytic anemia - suspect anemia of chronic kidney disease,  -Hemoglobin is 7.4- S/P transfused 1 unit of PRBCs on 04/06/2020 >>> improved to 8.8 >>   -On Eliquis but no bleeding  concerns noted at this time  Hypothyroidism--stable, c/n levothyroxine 112 mcg daily  Chronic atrial fibrillation -Cardizem initially discontinued due to bradycardia, however due to drop in EF and tachycardia will use metoprolol  XL 50 mg daily  -heart rate stable -failed to maintain NSR on Amiodarone and 2 DCCV's. Amiodarone stopped by Dr. Lovena Le earlier this month  -c/n  Eliquis for stroke prophylaxis -Per cardiology recommendation maintaining K > than 4.0, magnesium > than 2.0  Social/Ethics--- patient wants to be a DNI, but she is okay with CPR and ACLS protocols  Disposition/Need for in-Hospital Stay - patient unable to be discharged at this time due to -acute diastolic dysfunction CHF exacerbation requiring IV diuresis and close monitoring of renal function and electrolytes, as well as acute hypoxic  Generalized weakness/debility -Consulting PT/OT for evaluation recommendation -Social work consult for possible home health, DME's    Status is: Inpatient Remains inpatient appropriate because:Please see above   Disposition: The patient is from: Home              Anticipated d/c is to: Home              Anticipated d/c date is: 1 day              Patient currently is not medically stable to d/c. Barriers: Not Clinically Stable-   Procedures:- 04/05/20-right-sided thoracentesis removing 1 L -04/06/20 left-sided thoracentesis with 900 mL removal  Code Status : -  Code Status: Partial Code   Family Communication:    (patient is alert, awake and coherent)  Discussed with husband   Consults  :    DVT Prophylaxis  :   - SCDs   Place TED hose Start: 04/02/20 1307 apixaban (ELIQUIS) tablet 2.5 mg Start: 04/02/20 1000 SCDs Start: 04/02/20 0459 apixaban (ELIQUIS) tablet 2.5 mg    Lab Results  Component Value Date   PLT 435 (H) 04/07/2020   Inpatient Medications  Scheduled Meds: . apixaban  2.5 mg Oral BID  . Chlorhexidine Gluconate Cloth  6 each Topical Daily  . levothyroxine  112 mcg Oral Q0600  . metoprolol succinate  50 mg Oral Daily  . pantoprazole  40 mg Oral Daily  . polyethylene glycol  17 g Oral Daily   Continuous Infusions: PRN Meds:.acetaminophen **OR** acetaminophen, methocarbamol,  ondansetron (ZOFRAN) IV, traMADol  Anti-infectives (From admission, onward)   None        Objective:   Vitals:   04/07/20 2132 04/08/20 0537 04/08/20 0946 04/08/20 0954  BP: 101/62 105/69 108/71   Pulse: 60 94 89   Resp: 18 20    Temp: (!) 97.5 F (36.4 C) (!) 97.4 F (36.3 C)    TempSrc: Oral     SpO2: 94% 94%  96%  Weight:      Height:        Wt Readings from Last 3 Encounters:  04/07/20 68.9 kg  03/09/20 66 kg  02/27/20 65 kg    Intake/Output Summary (Last 24 hours) at 04/08/2020 1345 Last data filed at 04/07/2020 1500 Gross per 24 hour  Intake 29.89 ml  Output --  Net 29.89 ml       Physical Exam:   General:  Alert, oriented, cooperative, no distress;   HEENT:  Normocephalic, PERRL, otherwise with in Normal limits   Neuro:  CNII-XII intact. , normal motor and sensation, reflexes intact   Lungs:   Clear to auscultation BL, Respirations unlabored, no wheezes / crackles  Cardio:    S1/S2,  RRR, No murmure, No Rubs or Gallops   Abdomen:   Soft, non-tender, bowel sounds active all four quadrants,  no guarding or peritoneal signs.  Muscular skeletal:   Severe generalized weaknesses, Limited exam - in bed, able to move all 4 extremities, Normal strength,  2+ pulses,  symmetric, +2 pitting edema  Skin:  Dry, warm to touch, negative for any Rashes,  Wounds: Please see nursing documentation           Data Review:   Micro Results Recent Results (from the past 240 hour(s))  Resp Panel by RT-PCR (Flu A&B, Covid) Nasopharyngeal Swab     Status: None   Collection Time: 04/02/20  1:44 AM   Specimen: Nasopharyngeal Swab; Nasopharyngeal(NP) swabs in vial transport medium  Result Value Ref Range Status   SARS Coronavirus 2 by RT PCR NEGATIVE NEGATIVE Final    Comment: (NOTE) SARS-CoV-2 target nucleic acids are NOT DETECTED.  The SARS-CoV-2 RNA is generally detectable in upper respiratory specimens during the acute phase of infection. The lowest concentration of  SARS-CoV-2 viral copies this assay can detect is 138 copies/mL. A negative result does not preclude SARS-Cov-2 infection and should not be used as the sole basis for treatment or other patient management decisions. A negative result may occur with  improper specimen collection/handling, submission of specimen other than nasopharyngeal swab, presence of viral mutation(s) within the areas targeted by this assay, and inadequate number of viral copies(<138 copies/mL). A negative result must be combined with clinical observations, patient history, and epidemiological information. The expected result is Negative.  Fact Sheet for Patients:  EntrepreneurPulse.com.au  Fact Sheet for Healthcare Providers:  IncredibleEmployment.be  This test is no t yet approved or cleared by the [place] FDA and  has been authorized for detection and/or diagnosis of SARS-CoV-2 by FDA under an Emergency Use Authorization (EUA). This EUA will remain  in effect (meaning this test can be used) for the duration of the COVID-19 declaration under Section 564(b)(1) of the Act, 21 U.S.C.section 360bbb-3(b)(1), unless the authorization is terminated  or revoked sooner.       Influenza A by PCR NEGATIVE NEGATIVE Final   Influenza B by PCR NEGATIVE NEGATIVE Final    Comment: (NOTE) The Xpert Xpress SARS-CoV-2/FLU/RSV plus assay is intended as an aid in the diagnosis of influenza from Nasopharyngeal swab specimens and should not be used as a sole basis for treatment. Nasal washings and aspirates are unacceptable for Xpert Xpress SARS-CoV-2/FLU/RSV testing.  Fact Sheet for Patients: EntrepreneurPulse.com.au  Fact Sheet for Healthcare Providers: IncredibleEmployment.be  This test is not yet approved or cleared by the [place] FDA and has been authorized for detection and/or diagnosis of SARS-CoV-2 by FDA under an Emergency Use  Authorization (EUA). This EUA will remain in effect (meaning this test can be used) for the duration of the COVID-19 declaration under Section 564(b)(1) of the Act, 21 U.S.C. section 360bbb-3(b)(1), unless the authorization is terminated or revoked.  Performed at [facility], [address] [place], [place] [address]   MRSA PCR Screening     Status: None   Collection Time: 04/02/20  4:47 AM   Specimen: Nasal Mucosa; Nasopharyngeal  Result Value Ref Range Status   MRSA by PCR NEGATIVE NEGATIVE Final    Comment:        The GeneXpert MRSA Assay (FDA approved for NASAL specimens only), is one component of a comprehensive MRSA colonization surveillance program. It is not intended to diagnose MRSA infection nor to guide  or monitor treatment for MRSA infections. Performed at [facility], [address] [place], [place] [address]   Culture, body fluid w Gram Stain-bottle     Status: None (Preliminary result)   Collection Time: 04/05/20 12:17 PM   Specimen: Pleura  Result Value Ref Range Status   Specimen Description PLEURAL RIGHT LUNG  Final   Special Requests BOTTLES DRAWN AEROBIC AND ANAEROBIC 10CC  Final   Culture   Final    NO GROWTH 3 DAYS Performed at [facility], [address], [place], [place] [address]    Report Status PENDING  Incomplete  Gram stain     Status: None   Collection Time: 04/05/20 12:17 PM   Specimen: Pleura  Result Value Ref Range Status   Specimen Description PLEURAL  Final   Special Requests NONE  Final   Gram Stain   Final    NO ORGANISMS SEEN FEW WBC SEEN WBC PRESENT, PREDOMINANTLY MONONUCLEAR Performed at [facility], [address] [place], [place] [address]    Report Status 04/05/2020 FINAL  Final  Culture, body fluid w Gram Stain-bottle     Status: None (Preliminary result)   Collection Time: 04/06/20 10:00 AM   Specimen: Pleura  Result Value Ref Range Status   Specimen Description PLEURAL  Final   Special Requests BOTTLES DRAWN AEROBIC  AND ANAEROBIC 10CC  Final   Culture   Final    NO GROWTH 2 DAYS Performed at [facility], [address] [place], [place] [address]    Report Status PENDING  Incomplete  Gram stain     Status: None   Collection Time: 04/06/20 10:00 AM   Specimen: Pleura  Result Value Ref Range Status   Specimen Description PLEURAL  Final   Special Requests NONE  Final   Gram Stain   Final    NO ORGANISMS SEEN WBC PRESENT,BOTH PMN AND MONONUCLEAR Performed at [facility], [address], [place], [place] [address]    Report Status 04/06/2020 FINAL  Final    Radiology Reports DG Chest 2 View  Result Date: 04/06/2020 CLINICAL DATA:  Post LEFT thoracentesis EXAM: CHEST - 2 VIEW COMPARISON:  04/05/2020 FINDINGS: Enlargement of cardiac silhouette with pulmonary vascular congestion. Atherosclerotic calcification aorta. Accentuation of perihilar markings likely reflect mild pulmonary edema. Decreased LEFT pleural effusion post thoracentesis with mild residual LEFT basilar atelectasis. New airspace consolidation in RIGHT middle lobe question pneumonia. No pneumothorax. Bones demineralized. IMPRESSION: Significantly decreased LEFT pleural effusion with mild residual LEFT basilar atelectasis. No pneumothorax following thoracentesis. Enlargement of cardiac silhouette with pulmonary vascular congestion and minimal perihilar edema. New RIGHT middle lobe airspace consolidation question pneumonia. Electronically Signed   By: Lavonia Dana M.D.   On: 04/06/2020 11:08   DG Chest 2 View  Result Date: 04/05/2020 CLINICAL DATA:  BILATERAL pleural effusions, post RIGHT thoracentesis EXAM: CHEST - 2 VIEW COMPARISON:  04/03/2020 FINDINGS: PA view performed with expiratory technique due to preceding thoracentesis. Enlargement of cardiac silhouette with pulmonary vascular congestion. Atherosclerotic calcification aorta. Perihilar infiltrates consistent with pulmonary edema. Minimal residual RIGHT pleural effusion and basilar  atelectasis post thoracentesis. No pneumothorax. Moderate LEFT pleural effusion, slightly increased from previous exam. No acute osseous findings. IMPRESSION: Enlargement of cardiac silhouette with pulmonary vascular congestion and mild pulmonary edema. BILATERAL pleural effusions and basilar atelectasis, slightly increased on LEFT as well as decreased on RIGHT post RIGHT thoracentesis. No pneumothorax following RIGHT thoracentesis. Electronically Signed   By: Lavonia Dana M.D.   On: 04/05/2020 13:07  DG Chest 2 View  Result Date: 04/03/2020 CLINICAL DATA:  Shortness of breath for 1 week. EXAM: CHEST - 2 VIEW COMPARISON:  04/02/2020 and prior exams FINDINGS: Enlargement of the cardiopericardial silhouette is noted. Moderate bilateral pleural effusions and bilateral LOWER lung atelectasis/consolidation noted. No pneumothorax or acute bony abnormality. IMPRESSION: Enlargement of the cardiopericardial silhouette with moderate bilateral pleural effusions and bilateral LOWER lung atelectasis/consolidation. Electronically Signed   By: Margarette Canada M.D.   On: 04/03/2020 10:47   DG Chest Port 1 View  Result Date: 04/02/2020 CLINICAL DATA:  Shortness of breath for 4-5 days, worsening, COVID testing pending EXAM: PORTABLE CHEST 1 VIEW COMPARISON:  Radiograph 12/01/2019, CT 12/01/2019 FINDINGS: Chronically coarsened interstitial and bronchitic features with new patchy consolidative opacity in the right infrahilar lung and retrocardiac space as well as likely layering bilateral effusions. Mild pulmonary vascular congestion is present. Cardiac size is similar to prior though portions are obscured by overlying opacity. The aorta is calcified. The remaining cardiomediastinal contours are unremarkable. No other acute osseous or soft tissue abnormality. Degenerative changes are present in the imaged spine and shoulders. Telemetry leads overlie the chest. IMPRESSION: 1. Patchy consolidative opacity in the right infrahilar lung  and retrocardiac space with likely layering bilateral effusions, could reflect pneumonia, asymmetric edema or combination there of. 2. Chronically coarsened interstitial and bronchitic features. 3.  Aortic Atherosclerosis (ICD10-I70.0). Electronically Signed   By: Lovena Le M.D.   On: 04/02/2020 02:07   MM 3D SCREEN BREAST BILATERAL  Result Date: 03/24/2020 CLINICAL DATA:  Screening. EXAM: DIGITAL SCREENING BILATERAL MAMMOGRAM WITH TOMOSYNTHESIS AND CAD TECHNIQUE: Bilateral screening digital craniocaudal and mediolateral oblique mammograms were obtained. Bilateral screening digital breast tomosynthesis was performed. The images were evaluated with computer-aided detection. COMPARISON:  Previous exam(s). ACR Breast Density Category b: There are scattered areas of fibroglandular density. FINDINGS: There are no findings suspicious for malignancy. The images were evaluated with computer-aided detection. IMPRESSION: No mammographic evidence of malignancy. A result letter of this screening mammogram will be mailed directly to the patient. RECOMMENDATION: Screening mammogram in one year. (Code:SM-B-01Y) BI-RADS CATEGORY  1: Negative. Electronically Signed   By: Lajean Manes M.D.   On: 03/24/2020 14:23   ECHOCARDIOGRAM LIMITED  Result Date: 04/02/2020    ECHOCARDIOGRAM LIMITED REPORT   Patient Name:   RAINEY RODGER Date of Exam: 04/02/2020 Medical Rec #:  [id number]         Height:       66.0 in Accession #:    [id number]        Weight:       153.0 lb Date of Birth:  [birth year]         BSA:          1.785 m Patient Age:    82 years          BP:           95/63 mmHg Patient Gender: F                 HR:           112 bpm. Exam Location:  [facility] Procedure: Limited Echo, Cardiac Doppler and Color Doppler Indications:    CHF-Acute Diastolic  History:        Patient has prior history of Echocardiogram examinations, most                 recent 10/16/2019. CHF, Arrythmias:Atrial Fibrillation,  Signs/Symptoms:Shortness of Breath; Risk Factors:Hypertension                 and Former Smoker. DOE. Orthopnea. Elevated troponin.  Sonographer:    Clayton Lefort RDCS (AE) Referring Phys: [id number] OLADAPO ADEFESO IMPRESSIONS  1. Hypokinesis of the inferior wall (base, mid). Left ventricular ejection fraction, by estimation, is 45 to 50%. The left ventricle has mildly decreased function. There is mild left ventricular hypertrophy. Left ventricular diastolic parameters are indeterminate.  2. Right ventricular systolic function is low normal. The right ventricular size is mildly enlarged. There is moderately elevated pulmonary artery systolic pressure.  3. Left atrial size was mildly dilated.  4. Right atrial size was moderately dilated.  5. Large pleural effusion.  6. There are a couple MR jets; the largest is eccentric directed posteriorly along posterior wall of LA COmpared to previous report, MR is more severe. . The mitral valve is abnormal. Severe mitral valve regurgitation.  7. Tricuspid valve regurgitation is moderate.  8. The aortic valve is tricuspid. Aortic valve regurgitation is not visualized. Mild to moderate aortic valve sclerosis/calcification is present, without any evidence of aortic stenosis.  9. The inferior vena cava is dilated in size with <50% respiratory variability, suggesting right atrial pressure of 15 mmHg. FINDINGS  Left Ventricle: Hypokinesis of the inferior wall (base, mid). Left ventricular ejection fraction, by estimation, is 45 to 50%. The left ventricle has mildly decreased function. The left ventricular internal cavity size was normal in size. There is mild left ventricular hypertrophy. Left ventricular diastolic parameters are indeterminate. Right Ventricle: The right ventricular size is mildly enlarged. Right vetricular wall thickness was not assessed. Right ventricular systolic function is low normal. There is moderately elevated pulmonary artery systolic pressure. The tricuspid  regurgitant velocity is 3.15 m/s, and with an assumed right atrial pressure of 15 mmHg, the estimated right ventricular systolic pressure is 46.9 mmHg. Left Atrium: Left atrial size was mildly dilated. Right Atrium: Right atrial size was moderately dilated. Pericardium: Trivial pericardial effusion is present. Mitral Valve: There are a couple MR jets; the largest is eccentric directed posteriorly along posterior wall of LA COmpared to previous report, MR is more severe. The mitral valve is abnormal. There is moderate thickening of the mitral valve leaflet(s). Mild mitral annular calcification. Severe mitral valve regurgitation. Tricuspid Valve: The tricuspid valve is normal in structure. Tricuspid valve regurgitation is moderate. Aortic Valve: The aortic valve is tricuspid. Aortic valve regurgitation is not visualized. Mild to moderate aortic valve sclerosis/calcification is present, without any evidence of aortic stenosis. Pulmonic Valve: The pulmonic valve was not well visualized. Pulmonic valve regurgitation is not visualized. Aorta: The aortic root and ascending aorta are structurally normal, with no evidence of dilitation. Venous: The inferior vena cava is dilated in size with less than 50% respiratory variability, suggesting right atrial pressure of 15 mmHg. IAS/Shunts: No atrial level shunt detected by color flow Doppler. Additional Comments: There is a large pleural effusion. LEFT VENTRICLE PLAX 2D LVIDd:         4.71 cm LVIDs:         3.62 cm LV PW:         1.29 cm LV IVS:        1.35 cm LVOT diam:     1.80 cm LVOT Area:     2.54 cm  LEFT ATRIUM         Index LA diam:    4.90 cm 2.75 cm/m   AORTA Ao Root diam:  2.80 cm Ao Asc diam:  3.30 cm MR Peak grad:    97.2 mmHg   TRICUSPID VALVE MR Mean grad:    61.0 mmHg   TR Peak grad:   39.7 mmHg MR Vmax:         493.00 cm/s TR Vmax:        315.00 cm/s MR Vmean:        359.0 cm/s MR PISA:         3.08 cm    SHUNTS MR PISA Eff ROA: 24 mm      Systemic Diam: 1.80  cm MR PISA Radius:  0.70 cm Dorris Carnes MD Electronically signed by Dorris Carnes MD Signature Date/Time: 04/02/2020/5:26:49 PM    Final    US THORACENTESIS ASP PLEURAL SPACE W/IMG GUIDE  Result Date: 04/06/2020 INDICATION: Diffuse LEFT pleural effusion EXAM: ULTRASOUND GUIDED DIAGNOSTIC AND THERAPEUTIC LEFT THORACENTESIS MEDICATIONS: None. COMPLICATIONS: None immediate PROCEDURE: An ultrasound guided thoracentesis was thoroughly discussed with the patient and questions answered. The benefits, risks, alternatives and complications were also discussed. The patient understands and wishes to proceed with the procedure. Written consent was obtained. Ultrasound was performed to localize and mark an adequate pocket of fluid in the LEFT chest. The area was then prepped and draped in the normal sterile fashion. 1% Lidocaine was used for local anesthesia. Under ultrasound guidance a 8 French thoracentesis catheter was introduced. Thoracentesis was performed. The catheter was removed and a dressing applied. Procedure tolerated well by patient without immediate complication. FINDINGS: A total of approximately 900 mL of clear yellow LEFT pleural fluid was removed. Samples were sent to the laboratory as requested by the clinical team. IMPRESSION: Successful ultrasound guided LEFT thoracentesis yielding 900 mL of pleural fluid. Electronically Signed   By: Lavonia Dana M.D.   On: 04/06/2020 11:39   US THORACENTESIS ASP PLEURAL SPACE W/IMG GUIDE  Result Date: 04/05/2020 INDICATION: BILATERAL pleural effusions, shortness of breath, orthopnea EXAM: ULTRASOUND GUIDED DIAGNOSTIC AND THERAPEUTIC RIGHT THORACENTESIS MEDICATIONS: None. COMPLICATIONS: None immediate. PROCEDURE: An ultrasound guided thoracentesis was thoroughly discussed with the patient and questions answered. The benefits, risks, alternatives and complications were also discussed. The patient understands and wishes to proceed with the procedure. Written consent was  obtained. Ultrasound was performed to localize and mark an adequate pocket of fluid in the RIGHT chest. The area was then prepped and draped in the normal sterile fashion. 1% Lidocaine was used for local anesthesia. Under ultrasound guidance a 8 French thoracentesis catheter was introduced. Thoracentesis was performed. The catheter was removed and a dressing applied. FINDINGS: A total of approximately 1.03 L of yellow RIGHT pleural fluid was removed. Samples were sent to the laboratory as requested by the clinical team. IMPRESSION: Successful ultrasound guided RIGHT thoracentesis yielding 1.03 L of pleural fluid. Electronically Signed   By: Lavonia Dana M.D.   On: 04/05/2020 13:06     CBC Recent Labs  Lab 04/02/20 0148 04/03/20 0401 04/04/20 0606 04/06/20 0503 04/07/20 0500  WBC 9.8 8.8 11.8* 8.1 10.5  HGB 8.0* 7.4* 7.9* 7.4* 8.8*  HCT 27.5* 25.3* 26.9* 24.8* 29.7*  PLT 463* 347 347 368 435*  MCV 88.4 86.9 86.5 83.5 82.0  MCH 25.7* 25.4* 25.4* 24.9* 24.3*  MCHC 29.1* 29.2* 29.4* 29.8* 29.6*  RDW 19.5* 19.6* 19.4* 19.9* 21.1*  LYMPHSABS 1.9  --   --   --   --   MONOABS 1.2*  --   --   --   --   EOSABS 0.1  --   --   --   --  BASOSABS 0.1  --   --   --   --     Chemistries  Recent Labs  Lab 04/02/20 0148 04/03/20 0401 04/04/20 0541 04/05/20 0438 04/06/20 0503 04/07/20 0500 04/08/20 0549  NA 135 138  136 136 135 133* 132* 130*  K 5.7* 2.7*  2.7* 3.6 4.3 4.0 4.9 4.7  CL 99 98  98 99 97* 96* 93* 92*  CO2 21* 27  25 26 27 23 22 24   GLUCOSE 133* 97  97 109* 114* 77 75 100*  BUN 36* 32*  33* 28* 32* 49* 68* 84*  CREATININE 2.37* 1.61*  1.60* 1.36* 1.45* 1.89* 2.77* 2.91*  CALCIUM 8.7* 8.3*  8.2* 8.5* 8.4* 8.2* 8.4* 8.1*  MG  --   --   --   --  2.5*  --   --   AST 32 32  --   --   --   --   --   ALT 20 24  --   --   --   --   --   ALKPHOS 105 96  --   --   --   --   --   BILITOT 1.1 1.1  --   --   --   --   --     ------------------------------------------------------------------------------------------------------------------ No results for input(s): CHOL, HDL, LDLCALC, TRIG, CHOLHDL, LDLDIRECT in the last 72 hours.  No results found for: HGBA1C ------------------------------------------------------------------------------------------------------------------ No results for input(s): TSH, T4TOTAL, T3FREE, THYROIDAB in the last 72 hours.  Invalid input(s): FREET3 ------------------------------------------------------------------------------------------------------------------ No results for input(s): VITAMINB12, FOLATE, FERRITIN, TIBC, IRON, RETICCTPCT in the last 72 hours.  Coagulation profile Recent Labs  Lab 04/03/20 0401  INR 2.0*    No results for input(s): DDIMER in the last 72 hours.  Cardiac Enzymes No results for input(s): CKMB, TROPONINI, MYOGLOBIN in the last 168 hours.  Invalid input(s): CK ------------------------------------------------------------------------------------------------------------------    Component Value Date/Time   BNP 636.0 (H) 04/02/2020 0148    Deatra James M.D on 04/08/2020 at 1:45 PM  Go to [email or web address] - for contact info  [facility] - Office  [phone]

## 2020-04-08 NOTE — Progress Notes (Signed)
Physical Therapy Treatment Patient Details Name: Erin Schmidt MRN: [id number] DOB: 1933-01-08 Today's Date: 04/08/2020    History of Present Illness Erin Schmidt is a 87 y.o. female with medical history significant for  atrial fibrillation on Eliquis, hypertension, hypothyroidism, diastolic heart failure, rheumatoid arthritis and iron deficiency anemia who presents to the emergency department due to 1 week onset of increasing shortness of breath.  Patient states that she noted that she gained about 3 pounds, so she increased her torsemide dose which resulted in a subsequent loss of 4 pounds and improved breathing, so she went back to her normal prescribed dose and this resulted in regaining lost weight along with worsening shortness of breath which worsens on exertion and lying flat.  She endorsed increased leg swelling and increased abdominal girth, she also complained of nausea which was not relieved with Zofran.  She complained of some chest tightness which she thinks was due to increased efforts to breathe.  She denies fever, chills, cough or diarrhea.  EMS was activated, and on arrival of EMS, O2 sat was 89-90% on room air, supplemental oxygen via [place] was provided in route to the ED.  Of note, patient does not use oxygen at baseline.    PT Comments    Pt sitting on EOB and willing to participate with therapy today.  Began session with LE strengthening exercises and established HEP.  SBA during standing exercises for safety, pt presents with good static standing balance and ability to take hands away from walker while talking.  Decreased distance with gait today due to increased fatigue following therapeutic exercises for strengthening.  Pt ambulated with RW and slow controlled cadence with no LOB episodes.  Monitored SpO2 through session with range from 92--96%.  EOS pt used restroom with reports of yellow stools, RN aware.  EOS pt requested to return to bed as reports of LBP sitting in  chair.  RN in room at [place], phone and callbell within reach.       Follow Up Recommendations  Home health PT;Supervision for mobility/OOB;Supervision - Intermittent     Equipment Recommendations  Rolling walker with 5" wheels    Recommendations for Other Services       Precautions / Restrictions Precautions Precautions: Fall Restrictions Weight Bearing Restrictions: No    Mobility  Bed Mobility Overal bed mobility: Independent (Pt manual assists her LE onto bed, no assistance required)                  Transfers Overall transfer level: Needs assistance   Transfers: Sit to/from Stand Sit to Stand: Supervision (Cueing for hand placement to assist)         General transfer comment: increased time, labored movement, cueing for hand placement to assist with standing  Ambulation/Gait Ambulation/Gait assistance: Supervision Gait Distance (Feet): 60 Feet Assistive device: Rolling walker (2 wheeled) Gait Pattern/deviations: Decreased step length - right;Decreased stride length Gait velocity: decreased   General Gait Details: slow labored cadence, no LOB episodes.  Room air SpO2 range from 96% to 92%, recovered at 96% upon sitting EOB   Stairs             Wheelchair Mobility    Modified Rankin (Stroke Patients Only)       Balance  Cognition Arousal/Alertness: Awake/alert Behavior During Therapy: WFL for tasks assessed/performed Overall Cognitive Status: Within Functional Limits for tasks assessed                                        Exercises Total Joint Exercises Ankle Circles/Pumps: Both;10 reps;Standing Marching in Standing: Both;10 reps;Standing Bridges: Both;10 reps General Exercises - Lower Extremity Hip Flexion/Marching: Both;10 reps;Standing Heel Raises: Both;10 reps;Standing Other Exercises Other Exercises: Sit to stand 5x, required HHA    General  Comments        Pertinent Vitals/Pain Pain Assessment: No/denies pain    Home Living                      Prior Function            PT Goals (current goals can now be found in the care plan section)      Frequency           PT Plan Current plan remains appropriate    Co-evaluation              AM-PAC PT "6 Clicks" Mobility   Outcome Measure  Help needed turning from your back to your side while in a flat bed without using bedrails?: None Help needed moving from lying on your back to sitting on the side of a flat bed without using bedrails?: None Help needed moving to and from a bed to a chair (including a wheelchair)?: A Little Help needed standing up from a chair using your arms (e.g., wheelchair or bedside chair)?: None Help needed to walk in hospital room?: A Little Help needed climbing 3-5 steps with a railing? : A Little 6 Click Score: 21    End of Session Equipment Utilized During Treatment: Gait belt Activity Tolerance: Patient tolerated treatment well;Patient limited by fatigue Patient left: in bed;with call bell/phone within reach (sitting on EOB) Nurse Communication: Mobility status PT Visit Diagnosis: Unsteadiness on feet (R26.81);Other abnormalities of gait and mobility (R26.89);Muscle weakness (generalized) (M62.81)     Time: 2060-1561 PT Time Calculation (min) (ACUTE ONLY): 45 min  Charges:  $Gait Training: 8-22 mins $Therapeutic Exercise: 8-22 mins                     Ihor Austin, LPTA/CLT; CBIS [phone]  Aldona Lento 04/08/2020, 10:00 AM

## 2020-04-08 NOTE — Progress Notes (Signed)
Progress Note  Patient Name: Erin Schmidt Date of Encounter: 04/08/2020  Primary Cardiologist: Carlyle Dolly, MD  Subjective   Working with PT this morning.  Reports no change in breathing status, still generally improved.  No chest pain.  Making urine as before.  Inpatient Medications    Scheduled Meds: . apixaban  2.5 mg Oral BID  . Chlorhexidine Gluconate Cloth  6 each Topical Daily  . levothyroxine  112 mcg Oral Q0600  . metoprolol succinate  50 mg Oral Daily  . pantoprazole  40 mg Oral Daily  . polyethylene glycol  17 g Oral Daily    PRN Meds: acetaminophen **OR** acetaminophen, methocarbamol, ondansetron (ZOFRAN) IV, traMADol   Vital Signs    Vitals:   04/07/20 0842 04/07/20 1425 04/07/20 2132 04/08/20 0537  BP:  (!) 96/55 101/62 105/69  Pulse: 71 71 60 94  Resp:   18 20  Temp:  98.9 F (37.2 C) (!) 97.5 F (36.4 C) (!) 97.4 F (36.3 C)  TempSrc:  Oral Oral   SpO2:  93% 94% 94%  Weight:      Height:        Intake/Output Summary (Last 24 hours) at 04/08/2020 0920 Last data filed at 04/07/2020 1500 Gross per 24 hour  Intake 29.89 ml  Output --  Net 29.89 ml   Filed Weights   04/03/20 0500 04/04/20 0500 04/07/20 0500  Weight: 69 kg 67.8 kg 68.9 kg    Telemetry    Atrial fibrillation.  Personally reviewed.  ECG    No ECG reviewed.  Physical Exam   GEN: No acute distress. Neck: No JVD. Cardiac:  Irregularly irregular, 2/6 systolic murmur.  Respiratory: Nonlabored.  Decreased breath sounds at the bases. GI: Soft, nontender, bowel sounds present. MS:  Mild lower leg edema; No deformity.  Labs    Chemistry Recent Labs  Lab 04/02/20 0148 04/03/20 0401 04/04/20 0541 04/05/20 0438 04/06/20 0503 04/07/20 0500 04/08/20 0549  NA 135 138  136 136 135 133* 132* 130*  K 5.7* 2.7*  2.7* 3.6 4.3 4.0 4.9 4.7  CL 99 98  98 99 97* 96* 93* 92*  CO2 21* 27  25 26 27 23 22 24   GLUCOSE 133* 97  97 109* 114* 77 75 100*  BUN 36* 32*   33* 28* 32* 49* 68* 84*  CREATININE 2.37* 1.61*  1.60* 1.36* 1.45* 1.89* 2.77* 2.91*  CALCIUM 8.7* 8.3*  8.2* 8.5* 8.4* 8.2* 8.4* 8.1*  PROT 7.1 6.4*  --   --   --   --   --   ALBUMIN 3.9 3.5  3.5 3.5 3.1*  --  3.2*  --   AST 32 32  --   --   --   --   --   ALT 20 24  --   --   --   --   --   ALKPHOS 105 96  --   --   --   --   --   BILITOT 1.1 1.1  --   --   --   --   --   GFRNONAA 20* 31*  31* 38* 35* 26* 16* 15*  ANIONGAP 15 13  13 11 11 14  17* 14     Hematology Recent Labs  Lab 04/04/20 0606 04/06/20 0503 04/07/20 0500  WBC 11.8* 8.1 10.5  RBC 3.11* 2.97* 3.62*  HGB 7.9* 7.4* 8.8*  HCT 26.9* 24.8* 29.7*  MCV 86.5 83.5 82.0  MCH 25.4*  24.9* 24.3*  MCHC 29.4* 29.8* 29.6*  RDW 19.4* 19.9* 21.1*  PLT 347 368 435*    Cardiac Enzymes Recent Labs  Lab 04/02/20 0148 04/02/20 0348 04/02/20 0529 04/02/20 0755  TROPONINIHS 20* 21* 19* 17    BNP Recent Labs  Lab 04/02/20 0148  BNP 636.0*     Radiology    DG Chest 2 View  Result Date: 04/06/2020 CLINICAL DATA:  Post LEFT thoracentesis EXAM: CHEST - 2 VIEW COMPARISON:  04/05/2020 FINDINGS: Enlargement of cardiac silhouette with pulmonary vascular congestion. Atherosclerotic calcification aorta. Accentuation of perihilar markings likely reflect mild pulmonary edema. Decreased LEFT pleural effusion post thoracentesis with mild residual LEFT basilar atelectasis. New airspace consolidation in RIGHT middle lobe question pneumonia. No pneumothorax. Bones demineralized. IMPRESSION: Significantly decreased LEFT pleural effusion with mild residual LEFT basilar atelectasis. No pneumothorax following thoracentesis. Enlargement of cardiac silhouette with pulmonary vascular congestion and minimal perihilar edema. New RIGHT middle lobe airspace consolidation question pneumonia. Electronically Signed   By: Lavonia Dana M.D.   On: 04/06/2020 11:08   US THORACENTESIS ASP PLEURAL SPACE W/IMG GUIDE  Result Date: 04/06/2020 INDICATION:  Diffuse LEFT pleural effusion EXAM: ULTRASOUND GUIDED DIAGNOSTIC AND THERAPEUTIC LEFT THORACENTESIS MEDICATIONS: None. COMPLICATIONS: None immediate PROCEDURE: An ultrasound guided thoracentesis was thoroughly discussed with the patient and questions answered. The benefits, risks, alternatives and complications were also discussed. The patient understands and wishes to proceed with the procedure. Written consent was obtained. Ultrasound was performed to localize and mark an adequate pocket of fluid in the LEFT chest. The area was then prepped and draped in the normal sterile fashion. 1% Lidocaine was used for local anesthesia. Under ultrasound guidance a 8 French thoracentesis catheter was introduced. Thoracentesis was performed. The catheter was removed and a dressing applied. Procedure tolerated well by patient without immediate complication. FINDINGS: A total of approximately 900 mL of clear yellow LEFT pleural fluid was removed. Samples were sent to the laboratory as requested by the clinical team. IMPRESSION: Successful ultrasound guided LEFT thoracentesis yielding 900 mL of pleural fluid. Electronically Signed   By: Lavonia Dana M.D.   On: 04/06/2020 11:39    Cardiac Studies   Echocardiogram 04/02/2020: 1. Hypokinesis of the inferior wall (base, mid). Left ventricular  ejection fraction, by estimation, is 45 to 50%. The left ventricle has  mildly decreased function. There is mild left ventricular hypertrophy.  Left ventricular diastolic parameters are  indeterminate.  2. Right ventricular systolic function is low normal. The right  ventricular size is mildly enlarged. There is moderately elevated  pulmonary artery systolic pressure.  3. Left atrial size was mildly dilated.  4. Right atrial size was moderately dilated.  5. Large pleural effusion.  6. There are a couple MR jets; the largest is eccentric directed  posteriorly along posterior wall of LA COmpared to previous report, MR is   more severe. . The mitral valve is abnormal. Severe mitral valve  regurgitation.  7. Tricuspid valve regurgitation is moderate.  8. The aortic valve is tricuspid. Aortic valve regurgitation is not  visualized. Mild to moderate aortic valve sclerosis/calcification is  present, without any evidence of aortic stenosis.  9. The inferior vena cava is dilated in size with <50% respiratory  variability, suggesting right atrial pressure of 15 mmHg.   Patient Profile     87 y.o. female withpast medical historyof persistent atrial fibrillation(s/p DCCV in 11/2019 with recurrence at follow-up, repeat DCCV in 02/2020 following initiation of Amiodarone and recurrence at follow-up --> rate-control pursued  since),moderate MR,HTN, hypothyroidism and iron deficiency anemiawho is currently admitted for an acute CHF exacerbation.  Assessment & Plan    1.  Acute combined heart failure, LVEF 45 to 50% and RV contraction normal.  She is status post bilateral thoracenteses with removal of a total of 1900 cc combined, diuretics are on hold in light of acute on chronic renal insufficiency with creatinine up to 2.9.  She is still making urine.  No other nephrotoxic agents.  Blood pressure stable.  2.  Permanent atrial fibrillation.  Heart rate control has been better on Toprol-XL 50 mg daily, no changes for now.  Low normal blood pressure has limited titration somewhat.  She continues on Eliquis 2.5 mg twice daily for stroke prophylaxis.  3.  Acute on chronic renal insufficiency, creatinine up from 1.36-2.9 since the 27th.  Diuretics remain on hold.  4.  Chronic anemia, hemoglobin 8.8 after PRBC transfusion.  Continue Eliquis and Toprol-XL at current doses.  Lasix remains on hold as noted above.  If renal function does not begin to improve within the next 24 hours, would consider discussion with nephrology.  Continue with PT as well.  Signed, Rozann Lesches, MD  04/08/2020, 9:20 AM

## 2020-04-08 NOTE — TOC Progression Note (Signed)
Transition of Care [facility]) - Progression Note    Patient Details  Name: Erin Schmidt MRN: [id number] Date of Birth: November 26, 1932  Transition of Care [facility]) CM/SW Contact  Ihor Gully, LCSW Phone Number: 04/08/2020, 10:49 AM  Clinical Narrative:    DME providers discussed with patient. Oxygen and RW ordered through [employer]. Patient referred to [facility] in [place].   Expected Discharge Plan: Home/Self Care Barriers to Discharge: Continued Medical Work up  Expected Discharge Plan and Services Expected Discharge Plan: Home/Self Care In-house Referral: Clinical Social Work   Post Acute Care Choice: NA Living arrangements for the past 2 months: Single Family Home                 DME Arranged: Oxygen,Walker rolling DME Agency: [employer] Date DME Agency Contacted: 04/08/20 Time DME Agency Contacted: [phone] Representative spoke with at DME Agency: [place] ([place]) Interventions    Readmission Risk Interventions Readmission Risk Prevention Plan 04/05/2020  Transportation Screening Complete  HRI or [place] Complete  Social Work Consult for [place] Planning/Counseling [place] Not Applicable  Medication Review [employer]) Complete  Some recent data might be hidden

## 2020-04-09 LAB — URINALYSIS, COMPLETE (UACMP) WITH MICROSCOPIC
Bilirubin Urine: NEGATIVE
Glucose, UA: NEGATIVE mg/dL
Hgb urine dipstick: NEGATIVE
Ketones, ur: NEGATIVE mg/dL
Leukocytes,Ua: NEGATIVE
Nitrite: NEGATIVE
Protein, ur: NEGATIVE mg/dL
Specific Gravity, Urine: 1.014 (ref 1.005–1.030)
pH: 5 (ref 5.0–8.0)

## 2020-04-09 LAB — FERRITIN: Ferritin: 77 ng/mL (ref 11–307)

## 2020-04-09 LAB — IRON AND TIBC
Iron: 15 ug/dL — ABNORMAL LOW (ref 28–170)
Saturation Ratios: 4 % — ABNORMAL LOW (ref 10.4–31.8)
TIBC: 423 ug/dL (ref 250–450)
UIBC: 408 ug/dL

## 2020-04-09 LAB — BASIC METABOLIC PANEL
Anion gap: 12 (ref 5–15)
BUN: 72 mg/dL — ABNORMAL HIGH (ref 8–23)
CO2: 25 mmol/L (ref 22–32)
Calcium: 8.2 mg/dL — ABNORMAL LOW (ref 8.9–10.3)
Chloride: 95 mmol/L — ABNORMAL LOW (ref 98–111)
Creatinine, Ser: 1.91 mg/dL — ABNORMAL HIGH (ref 0.44–1.00)
GFR, Estimated: 25 mL/min — ABNORMAL LOW (ref >60)
Glucose, Bld: 103 mg/dL — ABNORMAL HIGH (ref 70–99)
Potassium: 3.7 mmol/L (ref 3.5–5.1)
Sodium: 132 mmol/L — ABNORMAL LOW (ref 135–145)

## 2020-04-09 LAB — CBC
HCT: 28 % — ABNORMAL LOW (ref 36.0–46.0)
Hemoglobin: 8.5 g/dL — ABNORMAL LOW (ref 12.0–15.0)
MCH: 24.7 pg — ABNORMAL LOW (ref 26.0–34.0)
MCHC: 30.4 g/dL (ref 30.0–36.0)
MCV: 81.4 fL (ref 80.0–100.0)
Platelets: 401 10*3/uL — ABNORMAL HIGH (ref 150–400)
RBC: 3.44 MIL/uL — ABNORMAL LOW (ref 3.87–5.11)
RDW: 20.7 % — ABNORMAL HIGH (ref 11.5–15.5)
WBC: 8.5 10*3/uL (ref 4.0–10.5)
nRBC: 0.4 % — ABNORMAL HIGH (ref 0.0–0.2)

## 2020-04-09 LAB — CREATININE, URINE, RANDOM: Creatinine, Urine: 57.54 mg/dL

## 2020-04-09 LAB — SODIUM, URINE, RANDOM: Sodium, Ur: <10 mmol/L

## 2020-04-09 MED ORDER — APIXABAN 2.5 MG PO TABS: 2.5000 mg | ORAL_TABLET | Freq: Two times a day (BID) | ORAL | 2 refills | Status: AC

## 2020-04-09 MED ORDER — TORSEMIDE 20 MG PO TABS: 20.0000 mg | ORAL_TABLET | Freq: Two times a day (BID) | ORAL | 2 refills | Status: DC

## 2020-04-09 MED ORDER — METOPROLOL SUCCINATE ER 50 MG PO TB24
50.0000 mg | ORAL_TABLET | Freq: Every day | ORAL | 2 refills | Status: AC
Start: 2020-04-10 — End: 2020-05-10

## 2020-04-09 MED ORDER — TORSEMIDE 20 MG PO TABS: 20.0000 mg | ORAL_TABLET | Freq: Two times a day (BID) | ORAL | Status: DC

## 2020-04-09 MED ORDER — POTASSIUM CHLORIDE CRYS ER 20 MEQ PO TBCR: 40.0000 meq | EXTENDED_RELEASE_TABLET | Freq: Every day | ORAL | 3 refills | Status: DC

## 2020-04-09 NOTE — Progress Notes (Addendum)
Progress Note  Patient Name: Erin Schmidt Date of Encounter: 04/09/2020  Primary Cardiologist: Carlyle Dolly, MD   Subjective   Breathing back to baseline. No chest pain or palpitations.   Inpatient Medications    Scheduled Meds: . apixaban  2.5 mg Oral BID  . Chlorhexidine Gluconate Cloth  6 each Topical Daily  . levothyroxine  112 mcg Oral Q0600  . metoprolol succinate  50 mg Oral Daily  . pantoprazole  40 mg Oral Daily  . polyethylene glycol  17 g Oral Daily    PRN Meds: acetaminophen **OR** acetaminophen, methocarbamol, ondansetron (ZOFRAN) IV, traMADol   Vital Signs    Vitals:   04/08/20 1454 04/08/20 2027 04/09/20 0452 04/09/20 0500  BP: 114/72 102/87 104/73   Pulse: 99 94 99   Resp: 18 20 19    Temp: 97.8 F (36.6 C) 98.2 F (36.8 C) (!) 97.3 F (36.3 C)   TempSrc:      SpO2: 98% 97% 96%   Weight:    71 kg  Height:       No intake or output data in the 24 hours ending 04/09/20 0906  Last 3 Weights 04/09/2020 04/07/2020 04/04/2020  Weight (lbs) 156 lb 8.4 oz 151 lb 14.4 oz 149 lb 8 oz  Weight (kg) 71 kg 68.9 kg 67.813 kg      Telemetry    Atrial fibrillation, HR in 70's to 90's.  - Personally Reviewed  ECG    No new tracings.   Physical Exam   General: Well developed, well nourished, female appearing in no acute distress. Head: Normocephalic, atraumatic.  Neck: JVD not elevated. Lungs:  Resp regular and unlabored, CTA without wheezing or rales Heart: Irregular irregular, S1, S2, no S3, S4, 2/6 systolic murmur along [place].  Abdomen: Soft, non-tender, non-distended.  Extremities: No clubbing or cyanosis, trace lower extremity edema. Distal pedal pulses are 2+ bilaterally. Neuro: Alert and oriented X 3. Moves all extremities spontaneously. Psych: Normal affect.  Labs    Chemistry Recent Labs  Lab 04/03/20 0401 04/04/20 0541 04/05/20 0438 04/06/20 0503 04/07/20 0500 04/08/20 0549 04/09/20 0447  NA 138  136 136 135   < > 132* 130*  132*  K 2.7*  2.7* 3.6 4.3   < > 4.9 4.7 3.7  CL 98  98 99 97*   < > 93* 92* 95*  CO2 27  25 26 27    < > 22 24 25   GLUCOSE 97  97 109* 114*   < > 75 100* 103*  BUN 32*  33* 28* 32*   < > 68* 84* 72*  CREATININE 1.61*  1.60* 1.36* 1.45*   < > 2.77* 2.91* 1.91*  CALCIUM 8.3*  8.2* 8.5* 8.4*   < > 8.4* 8.1* 8.2*  PROT 6.4*  --   --   --   --   --   --   ALBUMIN 3.5  3.5 3.5 3.1*  --  3.2*  --   --   AST 32  --   --   --   --   --   --   ALT 24  --   --   --   --   --   --   ALKPHOS 96  --   --   --   --   --   --   BILITOT 1.1  --   --   --   --   --   --   GFRNONAA  31*  31* 38* 35*   < > 16* 15* 25*  ANIONGAP 13  13 11 11    < > 17* 14 12   < > = values in this interval not displayed.     Hematology Recent Labs  Lab 04/06/20 0503 04/07/20 0500 04/09/20 0447  WBC 8.1 10.5 8.5  RBC 2.97* 3.62* 3.44*  HGB 7.4* 8.8* 8.5*  HCT 24.8* 29.7* 28.0*  MCV 83.5 82.0 81.4  MCH 24.9* 24.3* 24.7*  MCHC 29.8* 29.6* 30.4  RDW 19.9* 21.1* 20.7*  PLT 368 435* 401*    Radiology    No results found.  Cardiac Studies   Limited Echocardiogram: 04/02/2020 IMPRESSIONS   1. Hypokinesis of the inferior wall (base, mid). Left ventricular  ejection fraction, by estimation, is 45 to 50%. The left ventricle has  mildly decreased function. There is mild left ventricular hypertrophy.  Left ventricular diastolic parameters are  indeterminate.  2. Right ventricular systolic function is low normal. The right  ventricular size is mildly enlarged. There is moderately elevated  pulmonary artery systolic pressure.  3. Left atrial size was mildly dilated.  4. Right atrial size was moderately dilated.  5. Large pleural effusion.  6. There are a couple MR jets; the largest is eccentric directed  posteriorly along posterior wall of LA COmpared to previous report, MR is  more severe. . The mitral valve is abnormal. Severe mitral valve  regurgitation.  7. Tricuspid valve regurgitation is  moderate.  8. The aortic valve is tricuspid. Aortic valve regurgitation is not  visualized. Mild to moderate aortic valve sclerosis/calcification is  present, without any evidence of aortic stenosis.  9. The inferior vena cava is dilated in size with <50% respiratory  variability, suggesting right atrial pressure of 15 mmHg.   Patient Profile     87 y.o. female w/PMHof persistent atrial fibrillation(s/p DCCVin 11/2019 with recurrence at follow-up, repeat DCCV in 02/2020 following initiation of Amiodarone and recurrence at follow-up --> rate-control pursued since),moderate MR,HTN, hypothyroidism and iron deficiency anemiawho is currently admitted for an acute CHF exacerbation.  Assessment & Plan    1. 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  - She underwent bilateral thoracenteses procedures this admission with 1900 mL of fluid removed and initially received IV Lasix Q8H but diuretic therapy has been held for the past few days given her rising creatinine. She continues to have frequent urination. Reviewed with Nephrology and will plan to restart Torsemide at 20mg  BID tomorrow with close follow-up BMET and clinic visit as an outpatient for reassessment.   2. Permanent Atrial Fibrillation - Cardizem CD was discontinued this admission due to her reduced EF and replaced by Toprol-XL 50mg  daily. Continue Toprol-XL at current dosing for now.  - No reports of active bleeding. Eliquis has been reduced to 2.5mg  BID given her renal function this admission and age > 30.   3. Mitral Regurgitation - Severe by repeat echo this admission. Will plan for a repeat limited echocardiogram as an outpatient once volume status has stabilized.    4. AKI - Baseline creatinine 1.2 - 1.3. Peaked at 2.91 this  admission, improved to 1.91 today. Nephrology was consulted by the admitting team.   5. Anemia - Hgb at 7.4 on 3/29 and she received 1 unit pRBC's. Hgb remains stable at 8.5  today. Will need continued follow-up with Hematology as an outpatient.   6. Weakness - She has been evaluated by PT with Home Health PT recommended.     For questions or updates, please contact [place] Please consult [email or web address] for contact info under Cardiology/STEMI.   Arna Medici , PA-C 9:06 AM 04/09/2020 Pager: [phone]   Attending note:  Patient seen and examined.  Case discussed with Ms. Ahmed Prima PA-C, I agree with her above findings.  Also reviewed case with Nephrology.  Renal function looks better today with creatinine 1.9 down from 2.9.  Lasix has been held the last 2 days.  Symptomatically speaking, Ms. Spindler still feels better, heart rate has been better controlled in atrial fibrillation and she has worked with physical therapy.  Still making reasonable urine output without diuretic.  Weight has trended back up however.  Pertinent lab work includes potassium 3.7, creatinine 1.91, hemoglobin 8.5.  Anticipate discharge home within the next 24 hours, potentially later this afternoon if she continues to do well.  Continue Toprol-XL 50 mg daily, Eliquis 2.5 mg twice daily.  Would resume diuretic in the form of Demadex 20 mg twice daily beginning tomorrow.  We will set up a follow-up outpatient visit in the cardiology clinic.  Satira Sark, M.D., F.A.C.C.

## 2020-04-09 NOTE — Discharge Summary (Signed)
Physician Discharge Summary [facility]    Patient: Erin Schmidt                   Admit date: 04/02/2020   DOB: Jun 25, 1932             Discharge date:04/09/2020/10:24 AM OAC:[id number]                          PCP: Erin Sites, MD  Disposition:  HOME with Home Health   Recommendations for Outpatient Follow-up:   . Follow up: Nephrologist Dr.Coladonato, in 1 week with follow-up labs BMP, BUN/creatinine. . Follow with your cardiologist -current change medication may need to be adjusted 1-2 weeks . Follow-up with your PCP in 2-3 weeks  Discharge Condition: Stable   Code Status:   Code Status: Partial Code  Diet recommendation: Regular healthy diet   Discharge Diagnoses:    Principal Problem:   Acute on chronic combined systolic and diastolic CHF/H/o dCHF, now with New Systolic CHF Active Problems:   Normocytic anemia   Hypothyroidism   Acute exacerbation of CHF (congestive heart failure) (HCC)   Acute respiratory failure with hypoxia (HCC)   Thrombocytosis   Elevated brain natriuretic peptide (BNP) level   Elevated troponin   Essential hypertension   Acute kidney injury superimposed on CKD (HCC)   Hyperkalemia   History of Present Illness/ Hospital Course Erin Schmidt Summary:    Brief Summary:- 87 y.o.femalewith medical history significant foratrial fibrillation on Eliquis, hypertension, hypothyroidism,diastolic heart failure, rheumatoid arthritisand iron deficiency anemia admitted on 04/02/2020 with increasing weight gain and acute on chronic diastolic dysfunction CHF exacerbation despite cardiology service increasing  patient's diuretics as outpatient -----------------------------------------------------------------------------------------------------------------------------------------  Acute on chronic combined systolic and diastolic CHF/H/o dCHF, now with New Systolic CHF---- -improved shortness of breath, requiring oxygen on exertion -Continue  to have lower extremity edema -Initially patient was diuresed with IV Lasix then p.o., subsequently was held due to worsening kidney function. As her kidney function improved, nephrology was consulted, evaluated the patient Cardiology nephrology agreed that is safe for patient to start Torsemide 20 mg p.o. twice daily tomorrow  04/10/2020.    -Repeat echo shows that MR is now severe, it was previously moderate mitral regurg -Repeat echo also shows moderate TR -Repeat echo with EF down to 45 to 50% from 50-55 % back in October 2021 -Clinical exam, chest x-ray and BNP consistent with CHF exacerbation -Daily weight and fluid input and output monitoring -Repeat chest x-ray bilateral pleural effusion- - s/p Rt ultrasound thoracentesis on 04/05/20 and Lt thora on 04/06/20  -Last dose of Lasix was 04/07/2018 in AM (40 mg iv)   -Cardiology following closely, recommending continue Eliquis,  Acute Hypoxic Resp Failure:- PTA patient was not on O2 -O2 demand improved from 8 L to 2 L, on room air at rest, needing 2 L with exertion Qualified for home O2, prescribed  -Chest x-ray possible pneumonia--off antibiotics -Improved hypoxia-with diuretics - S/P bilateral thoracentesis-pleural fluid Gram stain and cultures NGTD -Cardiology recommending to continue with Toprol-XL, with holding ACE inhibitor, ARB and ARNI due to worsening kidney function, holding torsemide 20 mg p.o. twice daily   Acute kidney injury on CKD stage -3B with hyperkalemia- hyperkalemia resolved with Lokelma -Possible developing cardiorenal syndrome -Creatinine 1.75, 1.89 >> 2.77 >> 2.91 >>1.91 today BUN 49, 68, 84, 72 today -Lasix on hold... Subsequently discontinued -Consulting nephrology -appreciate their input (Kidney function likely exacerbated by diuretics, hypotension, hypovolemia.Marland Kitchen)  Chronic normocytic anemia - suspect anemia of chronic kidney disease,  -Hemoglobin is 7.4- S/P transfused 1 unit of PRBCs on  04/06/2020 >>> improved to 8.8 >>   -On Eliquis but no bleeding concerns noted at this time  Hypothyroidism--stable, c/n levothyroxine 112 mcg daily  Chronic atrial fibrillation -Cardizem initially discontinued due to bradycardia, however due to drop in EF and tachycardia will use metoprolol XL 50 mg daily  -heart rate stable -failed to maintain NSR on Amiodarone and 2 DCCV's. Amiodarone stopped by Dr. Lovena Le earlier this month  -c/n  Eliquis for stroke prophylaxis -Per cardiology recommendation maintaining K > than 4.0, magnesium > than 2.0  Social/Ethics--- patient wants to be a DNI, but she is okay with CPR and ACLS protocols  Disposition/Need for in-Hospital Stay - patient unable to be discharged at this time due to -acute diastolic dysfunction CHF exacerbation requiring IV diuresis and close monitoring of renal function and electrolytes, as well as acute hypoxic  Generalized weakness/debility -Consulting PT/OT for evaluation recommendation -Social work consult for possible home health, DME's     Disposition: The patient is from: Home  Anticipated d/c is to: Home w Home health   Procedures:- 04/05/20-right-sided thoracentesis removing 1 L -04/06/20 left-sided thoracentesis with 900 mL removal  Code Status : -  Code Status: Partial Code   Family Communication:    (patient is alert, awake and coherent)  Discussed with husband   Consults  :   Cardiologist/nephrologist    Risk of unplanned readmission Score:  is High.    Discharge Instructions:   Discharge Instructions    (HEART FAILURE PATIENTS) Call MD:  Anytime you have any of the following symptoms: 1) 3 pound weight gain in 24 hours or 5 pounds in 1 week 2) shortness of breath, with or without a dry hacking cough 3) swelling in the hands, feet or stomach 4) if you have to sleep on extra pillows at night in order to breathe.   Complete by: As directed    Activity as tolerated - No restrictions    Complete by: As directed    Ambulatory referral to Physical Therapy   Complete by: As directed    Call MD for:   Complete by: As directed    Follow-up with PCP, nephrologist, cardiologist within 1-2 weeks Your current medication has been changed for the current instructions, and current dosage of meds Recommending lab work [place], close follow-up with kidney function within 1-2 weeks   Diet - low sodium heart healthy   Complete by: As directed    Discharge instructions   Complete by: As directed    Follow-up with your cardiology, nephrology... Please follow with a current list of medication as your medication has been changed and modified since you have come to the hospital. Please weigh yourself daily, if you gain more than 5 pounds, or develop shortness of breath within 12-24 hours please notify your cardiologist.  Continue compression stocking... Caution, increase your ambulation with caution fall precautions  -Resume a diuretic medication called torsemide tomorrow, 04/10/2020 -Please use oxygen with any exertion exercise ambulation.. Maintain your O2 sat greater 94%   Increase activity slowly   Complete by: As directed        Medication List    STOP taking these medications   diltiazem 240 MG 24 hr capsule Commonly known as: CARDIZEM CD   meclizine 25 MG tablet Commonly known as: ANTIVERT   traMADol-acetaminophen 37.5-325 MG tablet Commonly known as: ULTRACET  TAKE these medications   apixaban 2.5 MG Tabs tablet Commonly known as: ELIQUIS Take 1 tablet (2.5 mg total) by mouth 2 (two) times daily. What changed:   medication strength  how much to take   CALCIUM 600 + D PO Take 1 tablet by mouth 2 (two) times daily.   cetirizine 10 MG tablet Commonly known as: ZYRTEC Take 10 mg by mouth daily.   cholecalciferol 25 MCG (1000 UNIT) tablet Commonly known as: VITAMIN D3 Take 1,000 Units by mouth daily.   escitalopram 10 MG tablet Commonly known as:  LEXAPRO Take 10 mg by mouth daily.   ketotifen 0.025 % ophthalmic solution Commonly known as: ZADITOR Place 1 drop into both eyes 2 (two) times daily as needed (allergies).   levothyroxine 112 MCG tablet Commonly known as: SYNTHROID Take 112 mcg by mouth daily before breakfast.   Melatonin 10 MG Caps Take 10 mg by mouth at bedtime.   metoprolol succinate 50 MG 24 hr tablet Commonly known as: TOPROL-XL Take 1 tablet (50 mg total) by mouth daily. Take with or immediately following a meal. Start taking on: April 10, 2020   omeprazole 20 MG capsule Commonly known as: PRILOSEC Take 20 mg by mouth every Monday, Wednesday, and Friday.   Orencia ClickJect 025 MG/ML Soaj Generic drug: Abatacept Inject 125 mg into the skin every 7 (seven) days.   potassium chloride SA 20 MEQ tablet Commonly known as: Klor-Con M20 Take 2 tablets (40 mEq total) by mouth daily. What changed: when to take this   torsemide 20 MG tablet Commonly known as: DEMADEX Take 1 tablet (20 mg total) by mouth 2 (two) times daily. Start taking on: April 10, 2020 What changed: how much to take       [place], [place], PA-C Follow up on 04/20/2020.   Specialties: Physician Assistant, Cardiology Why: Cardiology Hospital Follow-up on 04/20/2020 at 3:00 PM. Please call the office if needing a different date or time.  Contact information: [address] [place] [address] [phone]              Allergies  Allergen Reactions  . Feraheme [Ferumoxytol]     Reacted during infusion requiring medical intervention. Increased BP     Procedures /Studies:   DG Chest 2 View  Result Date: 04/06/2020 CLINICAL DATA:  Post LEFT thoracentesis EXAM: CHEST - 2 VIEW COMPARISON:  04/05/2020 FINDINGS: Enlargement of cardiac silhouette with pulmonary vascular congestion. Atherosclerotic calcification aorta. Accentuation of perihilar markings likely reflect mild pulmonary edema. Decreased LEFT  pleural effusion post thoracentesis with mild residual LEFT basilar atelectasis. New airspace consolidation in RIGHT middle lobe question pneumonia. No pneumothorax. Bones demineralized. IMPRESSION: Significantly decreased LEFT pleural effusion with mild residual LEFT basilar atelectasis. No pneumothorax following thoracentesis. Enlargement of cardiac silhouette with pulmonary vascular congestion and minimal perihilar edema. New RIGHT middle lobe airspace consolidation question pneumonia. Electronically Signed   By: Lavonia Dana M.D.   On: 04/06/2020 11:08   DG Chest 2 View  Result Date: 04/05/2020 CLINICAL DATA:  BILATERAL pleural effusions, post RIGHT thoracentesis EXAM: CHEST - 2 VIEW COMPARISON:  04/03/2020 FINDINGS: PA view performed with expiratory technique due to preceding thoracentesis. Enlargement of cardiac silhouette with pulmonary vascular congestion. Atherosclerotic calcification aorta. Perihilar infiltrates consistent with pulmonary edema. Minimal residual RIGHT pleural effusion and basilar atelectasis post thoracentesis. No pneumothorax. Moderate LEFT pleural effusion, slightly increased from previous exam. No acute osseous findings. IMPRESSION: Enlargement of cardiac silhouette with pulmonary  vascular congestion and mild pulmonary edema. BILATERAL pleural effusions and basilar atelectasis, slightly increased on LEFT as well as decreased on RIGHT post RIGHT thoracentesis. No pneumothorax following RIGHT thoracentesis. Electronically Signed   By: Lavonia Dana M.D.   On: 04/05/2020 13:07   DG Chest 2 View  Result Date: 04/03/2020 CLINICAL DATA:  Shortness of breath for 1 week. EXAM: CHEST - 2 VIEW COMPARISON:  04/02/2020 and prior exams FINDINGS: Enlargement of the cardiopericardial silhouette is noted. Moderate bilateral pleural effusions and bilateral LOWER lung atelectasis/consolidation noted. No pneumothorax or acute bony abnormality. IMPRESSION: Enlargement of the cardiopericardial  silhouette with moderate bilateral pleural effusions and bilateral LOWER lung atelectasis/consolidation. Electronically Signed   By: Margarette Canada M.D.   On: 04/03/2020 10:47   DG Chest Port 1 View  Result Date: 04/02/2020 CLINICAL DATA:  Shortness of breath for 4-5 days, worsening, COVID testing pending EXAM: PORTABLE CHEST 1 VIEW COMPARISON:  Radiograph 12/01/2019, CT 12/01/2019 FINDINGS: Chronically coarsened interstitial and bronchitic features with new patchy consolidative opacity in the right infrahilar lung and retrocardiac space as well as likely layering bilateral effusions. Mild pulmonary vascular congestion is present. Cardiac size is similar to prior though portions are obscured by overlying opacity. The aorta is calcified. The remaining cardiomediastinal contours are unremarkable. No other acute osseous or soft tissue abnormality. Degenerative changes are present in the imaged spine and shoulders. Telemetry leads overlie the chest. IMPRESSION: 1. Patchy consolidative opacity in the right infrahilar lung and retrocardiac space with likely layering bilateral effusions, could reflect pneumonia, asymmetric edema or combination there of. 2. Chronically coarsened interstitial and bronchitic features. 3.  Aortic Atherosclerosis (ICD10-I70.0). Electronically Signed   By: Lovena Le M.D.   On: 04/02/2020 02:07   MM 3D SCREEN BREAST BILATERAL  Result Date: 03/24/2020 CLINICAL DATA:  Screening. EXAM: DIGITAL SCREENING BILATERAL MAMMOGRAM WITH TOMOSYNTHESIS AND CAD TECHNIQUE: Bilateral screening digital craniocaudal and mediolateral oblique mammograms were obtained. Bilateral screening digital breast tomosynthesis was performed. The images were evaluated with computer-aided detection. COMPARISON:  Previous exam(s). ACR Breast Density Category b: There are scattered areas of fibroglandular density. FINDINGS: There are no findings suspicious for malignancy. The images were evaluated with computer-aided  detection. IMPRESSION: No mammographic evidence of malignancy. A result letter of this screening mammogram will be mailed directly to the patient. RECOMMENDATION: Screening mammogram in one year. (Code:SM-B-01Y) BI-RADS CATEGORY  1: Negative. Electronically Signed   By: Lajean Manes M.D.   On: 03/24/2020 14:23   ECHOCARDIOGRAM LIMITED  Result Date: 04/02/2020    ECHOCARDIOGRAM LIMITED REPORT   Patient Name:   Erin Schmidt Date of Exam: 04/02/2020 Medical Rec #:  [id number]         Height:       66.0 in Accession #:    [id number]        Weight:       153.0 lb Date of Birth:  03-16-1932         BSA:          1.785 m Patient Age:    55 years          BP:           95/63 mmHg Patient Gender: F                 HR:           112 bpm. Exam Location:  [facility] Procedure: Limited Echo, Cardiac Doppler and Color Doppler Indications:    CHF-Acute Diastolic  History:        Patient has prior history of Echocardiogram examinations, most                 recent 10/16/2019. CHF, Arrythmias:Atrial Fibrillation,                 Signs/Symptoms:Shortness of Breath; Risk Factors:Hypertension                 and Former Smoker. DOE. Orthopnea. Elevated troponin.  Sonographer:    Clayton Lefort RDCS (AE) Referring Phys: [id number] OLADAPO ADEFESO IMPRESSIONS  1. Hypokinesis of the inferior wall (base, mid). Left ventricular ejection fraction, by estimation, is 45 to 50%. The left ventricle has mildly decreased function. There is mild left ventricular hypertrophy. Left ventricular diastolic parameters are indeterminate.  2. Right ventricular systolic function is low normal. The right ventricular size is mildly enlarged. There is moderately elevated pulmonary artery systolic pressure.  3. Left atrial size was mildly dilated.  4. Right atrial size was moderately dilated.  5. Large pleural effusion.  6. There are a couple MR jets; the largest is eccentric directed posteriorly along posterior wall of LA COmpared to previous report, MR is more  severe. . The mitral valve is abnormal. Severe mitral valve regurgitation.  7. Tricuspid valve regurgitation is moderate.  8. The aortic valve is tricuspid. Aortic valve regurgitation is not visualized. Mild to moderate aortic valve sclerosis/calcification is present, without any evidence of aortic stenosis.  9. The inferior vena cava is dilated in size with <50% respiratory variability, suggesting right atrial pressure of 15 mmHg. FINDINGS  Left Ventricle: Hypokinesis of the inferior wall (base, mid). Left ventricular ejection fraction, by estimation, is 45 to 50%. The left ventricle has mildly decreased function. The left ventricular internal cavity size was normal in size. There is mild left ventricular hypertrophy. Left ventricular diastolic parameters are indeterminate. Right Ventricle: The right ventricular size is mildly enlarged. Right vetricular wall thickness was not assessed. Right ventricular systolic function is low normal. There is moderately elevated pulmonary artery systolic pressure. The tricuspid regurgitant velocity is 3.15 m/s, and with an assumed right atrial pressure of 15 mmHg, the estimated right ventricular systolic pressure is 82.5 mmHg. Left Atrium: Left atrial size was mildly dilated. Right Atrium: Right atrial size was moderately dilated. Pericardium: Trivial pericardial effusion is present. Mitral Valve: There are a couple MR jets; the largest is eccentric directed posteriorly along posterior wall of LA COmpared to previous report, MR is more severe. The mitral valve is abnormal. There is moderate thickening of the mitral valve leaflet(s). Mild mitral annular calcification. Severe mitral valve regurgitation. Tricuspid Valve: The tricuspid valve is normal in structure. Tricuspid valve regurgitation is moderate. Aortic Valve: The aortic valve is tricuspid. Aortic valve regurgitation is not visualized. Mild to moderate aortic valve sclerosis/calcification is present, without any evidence  of aortic stenosis. Pulmonic Valve: The pulmonic valve was not well visualized. Pulmonic valve regurgitation is not visualized. Aorta: The aortic root and ascending aorta are structurally normal, with no evidence of dilitation. Venous: The inferior vena cava is dilated in size with less than 50% respiratory variability, suggesting right atrial pressure of 15 mmHg. IAS/Shunts: No atrial level shunt detected by color flow Doppler. Additional Comments: There is a large pleural effusion. LEFT VENTRICLE PLAX 2D LVIDd:         4.71 cm LVIDs:         3.62 cm LV PW:         1.29 cm  LV IVS:        1.35 cm LVOT diam:     1.80 cm LVOT Area:     2.54 cm  LEFT ATRIUM         Index LA diam:    4.90 cm 2.75 cm/m   AORTA Ao Root diam: 2.80 cm Ao Asc diam:  3.30 cm MR Peak grad:    97.2 mmHg   TRICUSPID VALVE MR Mean grad:    61.0 mmHg   TR Peak grad:   39.7 mmHg MR Vmax:         493.00 cm/s TR Vmax:        315.00 cm/s MR Vmean:        359.0 cm/s MR PISA:         3.08 cm    SHUNTS MR PISA Eff ROA: 24 mm      Systemic Diam: 1.80 cm MR PISA Radius:  0.70 cm Dorris Carnes MD Electronically signed by Dorris Carnes MD Signature Date/Time: 04/02/2020/5:26:49 PM    Final    US THORACENTESIS ASP PLEURAL SPACE W/IMG GUIDE  Result Date: 04/06/2020 INDICATION: Diffuse LEFT pleural effusion EXAM: ULTRASOUND GUIDED DIAGNOSTIC AND THERAPEUTIC LEFT THORACENTESIS MEDICATIONS: None. COMPLICATIONS: None immediate PROCEDURE: An ultrasound guided thoracentesis was thoroughly discussed with the patient and questions answered. The benefits, risks, alternatives and complications were also discussed. The patient understands and wishes to proceed with the procedure. Written consent was obtained. Ultrasound was performed to localize and mark an adequate pocket of fluid in the LEFT chest. The area was then prepped and draped in the normal sterile fashion. 1% Lidocaine was used for local anesthesia. Under ultrasound guidance a 8 French thoracentesis catheter  was introduced. Thoracentesis was performed. The catheter was removed and a dressing applied. Procedure tolerated well by patient without immediate complication. FINDINGS: A total of approximately 900 mL of clear yellow LEFT pleural fluid was removed. Samples were sent to the laboratory as requested by the clinical team. IMPRESSION: Successful ultrasound guided LEFT thoracentesis yielding 900 mL of pleural fluid. Electronically Signed   By: Lavonia Dana M.D.   On: 04/06/2020 11:39   US THORACENTESIS ASP PLEURAL SPACE W/IMG GUIDE  Result Date: 04/05/2020 INDICATION: BILATERAL pleural effusions, shortness of breath, orthopnea EXAM: ULTRASOUND GUIDED DIAGNOSTIC AND THERAPEUTIC RIGHT THORACENTESIS MEDICATIONS: None. COMPLICATIONS: None immediate. PROCEDURE: An ultrasound guided thoracentesis was thoroughly discussed with the patient and questions answered. The benefits, risks, alternatives and complications were also discussed. The patient understands and wishes to proceed with the procedure. Written consent was obtained. Ultrasound was performed to localize and mark an adequate pocket of fluid in the RIGHT chest. The area was then prepped and draped in the normal sterile fashion. 1% Lidocaine was used for local anesthesia. Under ultrasound guidance a 8 French thoracentesis catheter was introduced. Thoracentesis was performed. The catheter was removed and a dressing applied. FINDINGS: A total of approximately 1.03 L of yellow RIGHT pleural fluid was removed. Samples were sent to the laboratory as requested by the clinical team. IMPRESSION: Successful ultrasound guided RIGHT thoracentesis yielding 1.03 L of pleural fluid. Electronically Signed   By: Lavonia Dana M.D.   On: 04/05/2020 13:06     Subjective:   Patient was seen and examined 04/09/2020, 10:24 AM Patient stable today. No acute distress.  No issues overnight Stable for discharge.  Discharge Exam:    Vitals:   04/08/20 1454 04/08/20 2027 04/09/20  0452 04/09/20 0500  BP: 114/72 102/87 104/73   Pulse: 99  94 99   Resp: 18 20 19    Temp: 97.8 F (36.6 C) 98.2 F (36.8 C) (!) 97.3 F (36.3 C)   TempSrc:      SpO2: 98% 97% 96%   Weight:    71 kg  Height:        General: Pt lying comfortably in bed & appears in no obvious distress. Cardiovascular: S1 & S2 heard, RRR, S1/S2 +. No murmurs, rubs, gallops or clicks. No JVD or pedal edema. Respiratory: Clear to auscultation without wheezing, rhonchi or crackles. No increased work of breathing. Abdominal:  Non-distended, non-tender & soft. No organomegaly or masses appreciated. Normal bowel sounds heard. CNS: Alert and oriented. No focal deficits. Extremities: no edema, no cyanosis      The results of significant diagnostics from this hospitalization (including imaging, microbiology, ancillary and laboratory) are listed below for reference.      Microbiology:   Recent Results (from the past 240 hour(s))  Resp Panel by RT-PCR (Flu A&B, Covid) Nasopharyngeal Swab     Status: None   Collection Time: 04/02/20  1:44 AM   Specimen: Nasopharyngeal Swab; Nasopharyngeal(NP) swabs in vial transport medium  Result Value Ref Range Status   SARS Coronavirus 2 by RT PCR NEGATIVE NEGATIVE Final    Comment: (NOTE) SARS-CoV-2 target nucleic acids are NOT DETECTED.  The SARS-CoV-2 RNA is generally detectable in upper respiratory specimens during the acute phase of infection. The lowest concentration of SARS-CoV-2 viral copies this assay can detect is 138 copies/mL. A negative result does not preclude SARS-Cov-2 infection and should not be used as the sole basis for treatment or other patient management decisions. A negative result may occur with  improper specimen collection/handling, submission of specimen other than nasopharyngeal swab, presence of viral mutation(s) within the areas targeted by this assay, and inadequate number of viral copies(<138 copies/mL). A negative result must be  combined with clinical observations, patient history, and epidemiological information. The expected result is Negative.  Fact Sheet for Patients:  EntrepreneurPulse.com.au  Fact Sheet for Healthcare Providers:  IncredibleEmployment.be  This test is no t yet approved or cleared by the [place] FDA and  has been authorized for detection and/or diagnosis of SARS-CoV-2 by FDA under an Emergency Use Authorization (EUA). This EUA will remain  in effect (meaning this test can be used) for the duration of the COVID-19 declaration under Section 564(b)(1) of the Act, 21 U.S.C.section 360bbb-3(b)(1), unless the authorization is terminated  or revoked sooner.       Influenza A by PCR NEGATIVE NEGATIVE Final   Influenza B by PCR NEGATIVE NEGATIVE Final    Comment: (NOTE) The Xpert Xpress SARS-CoV-2/FLU/RSV plus assay is intended as an aid in the diagnosis of influenza from Nasopharyngeal swab specimens and should not be used as a sole basis for treatment. Nasal washings and aspirates are unacceptable for Xpert Xpress SARS-CoV-2/FLU/RSV testing.  Fact Sheet for Patients: EntrepreneurPulse.com.au  Fact Sheet for Healthcare Providers: IncredibleEmployment.be  This test is not yet approved or cleared by the [place] FDA and has been authorized for detection and/or diagnosis of SARS-CoV-2 by FDA under an Emergency Use Authorization (EUA). This EUA will remain in effect (meaning this test can be used) for the duration of the COVID-19 declaration under Section 564(b)(1) of the Act, 21 U.S.C. section 360bbb-3(b)(1), unless the authorization is terminated or revoked.  Performed at [facility], [address] [place], [place] [address]   MRSA PCR Screening     Status: None  Collection Time: 04/02/20  4:47 AM   Specimen: Nasal Mucosa; Nasopharyngeal  Result Value Ref Range Status   MRSA by PCR NEGATIVE NEGATIVE  Final    Comment:        The GeneXpert MRSA Assay (FDA approved for NASAL specimens only), is one component of a comprehensive MRSA colonization surveillance program. It is not intended to diagnose MRSA infection nor to guide or monitor treatment for MRSA infections. Performed at [facility], [address], [place], [place] [address]   Culture, body fluid w Gram Stain-bottle     Status: None (Preliminary result)   Collection Time: 04/05/20 12:17 PM   Specimen: Pleura  Result Value Ref Range Status   Specimen Description PLEURAL RIGHT LUNG  Final   Special Requests BOTTLES DRAWN AEROBIC AND ANAEROBIC 10CC  Final   Culture   Final    NO GROWTH 4 DAYS Performed at [facility], [address], [place], [place] [address]    Report Status PENDING  Incomplete  Gram stain     Status: None   Collection Time: 04/05/20 12:17 PM   Specimen: Pleura  Result Value Ref Range Status   Specimen Description PLEURAL  Final   Special Requests NONE  Final   Gram Stain   Final    NO ORGANISMS SEEN FEW WBC SEEN WBC PRESENT, PREDOMINANTLY MONONUCLEAR Performed at [facility], [address], [place], [place] [address]    Report Status 04/05/2020 FINAL  Final  Culture, body fluid w Gram Stain-bottle     Status: None (Preliminary result)   Collection Time: 04/06/20 10:00 AM   Specimen: Pleura  Result Value Ref Range Status   Specimen Description PLEURAL  Final   Special Requests BOTTLES DRAWN AEROBIC AND ANAEROBIC 10CC  Final   Culture   Final    NO GROWTH 3 DAYS Performed at [facility], [address] [place], [place] [address]    Report Status PENDING  Incomplete  Gram stain     Status: None   Collection Time: 04/06/20 10:00 AM   Specimen: Pleura  Result Value Ref Range Status   Specimen Description PLEURAL  Final   Special Requests NONE  Final   Gram Stain   Final    NO ORGANISMS SEEN WBC PRESENT,BOTH PMN AND MONONUCLEAR Performed at [facility], [address] [place], [place]  [address]    Report Status 04/06/2020 FINAL  Final     Labs:   CBC: Recent Labs  Lab 04/03/20 0401 04/04/20 0606 04/06/20 0503 04/07/20 0500 04/09/20 0447  WBC 8.8 11.8* 8.1 10.5 8.5  HGB 7.4* 7.9* 7.4* 8.8* 8.5*  HCT 25.3* 26.9* 24.8* 29.7* 28.0*  MCV 86.9 86.5 83.5 82.0 81.4  PLT 347 347 368 435* 951*   Basic Metabolic Panel: Recent Labs  Lab 04/03/20 0401 04/04/20 0541 04/05/20 0438 04/06/20 0503 04/07/20 0500 04/08/20 0549 04/09/20 0447  NA 138  136 136 135 133* 132* 130* 132*  K 2.7*  2.7* 3.6 4.3 4.0 4.9 4.7 3.7  CL 98  98 99 97* 96* 93* 92* 95*  CO2 27  25 26 27 23 22 24 25   GLUCOSE 97  97 109* 114* 77 75 100* 103*  BUN 32*  33* 28* 32* 49* 68* 84* 72*  CREATININE 1.61*  1.60* 1.36* 1.45* 1.89* 2.77* 2.91* 1.91*  CALCIUM 8.3*  8.2* 8.5* 8.4* 8.2* 8.4* 8.1* 8.2*  MG  --   --   --  2.5*  --   --   --  PHOS 3.9 3.3 3.6  --  7.3*  --   --    Liver Function Tests: Recent Labs  Lab 04/03/20 0401 04/04/20 0541 04/05/20 0438 04/07/20 0500  AST 32  --   --   --   ALT 24  --   --   --   ALKPHOS 96  --   --   --   BILITOT 1.1  --   --   --   PROT 6.4*  --   --   --   ALBUMIN 3.5  3.5 3.5 3.1* 3.2*   BNP (last 3 results) Recent Labs    12/01/19 0613 04/02/20 0148  BNP 701.0* 636.0*      Time coordinating discharge: Over 55 minutes  SIGNED: Deatra James, MD, FACP, FHM. [facility],  Please use [email or web address] to Page If 7PM-7AM, please contact night-coverage [email or web address] Ota [facility] 04/09/2020, 10:24 AM

## 2020-04-09 NOTE — Consult Note (Signed)
Reason for Consult: AKI/CKD stage 3a Referring Physician: Roger Shelter, MD  Erin Schmidt is an 87 y.o. female with a PMH significant for persistent atrial fibrillation, HTN, moderate mitral regurgitation, chronic diastolic CHF, Rheumatoid arthritis, and CKD stage 3a (Cr 1.2-1.4) who presented to [facility] ED via EMS with a 1 week history of worsening DOE, orthopnea, weight gain, and lower extremity edema.  She had been in close contact with her Cardiology office who had been increasing her torsemide dose from 20 mg bid to 40 mg bid, then to 60 mg bid without much improvement of her symptoms.  EMS noted SpO2 of 89-90% on room air and started on oxygen via [place].  In the ED she had HR of 51, BP 126/52, SpO2 of 93% on 6L via , CXR with moderate bilateral pleural effusions and enlargement of the cardiopericardial silhouette.  Labs were notable for K of 5.7, BUN/Cr 36/2.37, Hgb 8, and BNP 636.  She was admitted for acute on chronic combined systolic and diastolic CHF.  Cardiology was consulted and repeat ECHO revealed a drop in her LVEF to 45-50% and worsening mitral regurgitation (compared to last ECHO in 10/2019).  Her renal function initially improved with diuresis but then started to climb again.  Diuretics were held but Scr continued to climb to peak at 2.91 on 04/08/20 and we were consulted to further evaluate her AKI/CKD stage 3a.  The trend in Scr is seen below.  Trend in Creatinine: Creatinine, Ser  Date/Time Value Ref Range Status  04/09/2020 04:47 AM 1.91 (H) 0.44 - 1.00 mg/dL Final  04/08/2020 05:49 AM 2.91 (H) 0.44 - 1.00 mg/dL Final  04/07/2020 05:00 AM 2.77 (H) 0.44 - 1.00 mg/dL Final  04/06/2020 05:03 AM 1.89 (H) 0.44 - 1.00 mg/dL Final  04/05/2020 04:38 AM 1.45 (H) 0.44 - 1.00 mg/dL Final  04/04/2020 05:41 AM 1.36 (H) 0.44 - 1.00 mg/dL Final  04/03/2020 04:01 AM 1.60 (H) 0.44 - 1.00 mg/dL Final  04/03/2020 04:01 AM 1.61 (H) 0.44 - 1.00 mg/dL Final  04/02/2020 01:48 AM 2.37 (H) 0.44 - 1.00 mg/dL  Final  02/27/2020 02:51 PM 1.25 (H) 0.44 - 1.00 mg/dL Final  02/18/2020 08:41 AM 1.75 (H) 0.44 - 1.00 mg/dL Final  02/11/2020 02:58 PM 1.42 (H) 0.44 - 1.00 mg/dL Final  02/02/2020 10:57 AM 2.01 (H) 0.44 - 1.00 mg/dL Final  01/28/2020 12:56 PM 2.30 (H) 0.44 - 1.00 mg/dL Final  12/22/2019 11:29 AM 1.60 (H) 0.44 - 1.00 mg/dL Final  12/15/2019 11:07 AM 1.21 (H) 0.44 - 1.00 mg/dL Final  12/03/2019 06:45 AM 1.04 (H) 0.44 - 1.00 mg/dL Final  12/02/2019 05:40 AM 0.99 0.44 - 1.00 mg/dL Final  12/01/2019 06:13 AM 0.98 0.44 - 1.00 mg/dL Final  09/25/2019 03:24 PM 1.05 (H) 0.44 - 1.00 mg/dL Final  09/05/2019 11:15 AM 1.07 (H) 0.44 - 1.00 mg/dL Final  10/09/2012 06:20 AM 0.72 0.50 - 1.10 mg/dL Final  10/08/2012 09:44 PM 0.80 0.50 - 1.10 mg/dL Final  01/12/2011 11:00 AM 0.83 0.50 - 1.10 mg/dL Final  10/22/2010 12:35 AM 0.63 0.50 - 1.10 mg/dL Final    PMH:   Past Medical History:  Diagnosis Date  . Arthritis   . Atrial fibrillation ([place])   . Essential hypertension   . GERD (gastroesophageal reflux disease)   . Hyperlipemia   . Hypothyroidism   . Iron deficiency anemia 05/07/2019  . Migraine   . Mitral regurgitation   . Vertigo     PSH:   Past Surgical History:  Procedure Laterality Date  . APPENDECTOMY    . BREAST BIOPSY Right    benign  . BREAST EXCISIONAL BIOPSY Left    Benign  . BREAST EXCISIONAL BIOPSY Left    Benign  . BREAST LUMPECTOMY  30 yrs ago   Dr. Milbert Coulter- rt breast  . BREAST LUMPECTOMY  15 yrs ago   left- Dr. Romona Curls  . BREAST LUMPECTOMY  01/2011  . CARDIOVERSION N/A 11/10/2019   Procedure: CARDIOVERSION;  Surgeon: Arnoldo Lenis, MD;  Location: [facility];  Service: Endoscopy;  Laterality: N/A;  . CARDIOVERSION N/A 02/19/2020   Procedure: CARDIOVERSION;  Surgeon: Arnoldo Lenis, MD;  Location: [facility];  Service: Endoscopy;  Laterality: N/A;  . CHOLECYSTECTOMY    . COLONOSCOPY    . COLONOSCOPY  04/13/2011   Procedure: COLONOSCOPY;  Surgeon: Rogene Houston, MD;   Location: [facility];  Service: Endoscopy;  Laterality: N/A;  930  . MASTECTOMY, PARTIAL  01/13/2011   Procedure: MASTECTOMY PARTIAL;  Surgeon: Adin Hector, MD;  Location: [place];  Service: General;  Laterality: Left;  left partial mastectomy with needle locallization  . VAGINAL HYSTERECTOMY     2 partials    Allergies:  Allergies  Allergen Reactions  . Feraheme [Ferumoxytol]     Reacted during infusion requiring medical intervention. Increased BP    Medications:   Prior to Admission medications   Medication Sig Start Date End Date Taking? Authorizing Provider  Abatacept (ORENCIA CLICKJECT) 573 MG/ML SOAJ Inject 125 mg into the skin every 7 (seven) days. 03/08/20  Yes Ofilia Neas, PA-C  Calcium Carb-Cholecalciferol (CALCIUM 600 + D PO) Take 1 tablet by mouth 2 (two) times daily.   Yes [provider]  cetirizine (ZYRTEC) 10 MG tablet Take 10 mg by mouth daily.   Yes [provider]  cholecalciferol (VITAMIN D3) 25 MCG (1000 UNIT) tablet Take 1,000 Units by mouth daily.   Yes [provider]  diltiazem (CARDIZEM CD) 240 MG 24 hr capsule Take 1 capsule (240 mg total) by mouth daily. 12/03/19  Yes Barton Dubois, MD  ELIQUIS 5 MG TABS tablet TAKE 1 TABLET BY MOUTH TWICE A DAY Patient taking differently: Take 5 mg by mouth 2 (two) times daily. 02/03/20  Yes BranchAlphonse Guild, MD  escitalopram (LEXAPRO) 10 MG tablet Take 10 mg by mouth daily.   Yes [provider]  ketotifen (ZADITOR) 0.025 % ophthalmic solution Place 1 drop into both eyes 2 (two) times daily as needed (allergies).   Yes [provider]  levothyroxine (SYNTHROID, LEVOTHROID) 112 MCG tablet Take 112 mcg by mouth daily before breakfast.  05/02/16  Yes [provider]  meclizine (ANTIVERT) 25 MG tablet Take 25 mg by mouth 3 (three) times daily as needed for dizziness.   Yes [provider]  Melatonin 10 MG CAPS Take 10 mg by mouth at bedtime.    Yes [provider]  omeprazole (PRILOSEC) 20 MG capsule Take 20 mg by mouth every Monday, Wednesday, and Friday.   Yes [provider]  potassium chloride SA (KLOR-CON M20) 20 MEQ tablet Take 2 tablets (40 mEq total) by mouth 2 (two) times daily. 02/27/20 05/27/20 Yes Strader, Fransisco Hertz, PA-C  torsemide (DEMADEX) 20 MG tablet Take 3 tablets (60 mg total) by mouth 2 (two) times daily. 03/31/20 06/29/20 Yes Strader, Fransisco Hertz, PA-C  traMADol-acetaminophen (ULTRACET) 37.5-325 MG tablet Take 1 tablet by mouth in the morning and at bedtime.    Yes  [provider]    Inpatient medications: . apixaban  2.5 mg Oral BID  . Chlorhexidine Gluconate Cloth  6 each Topical Daily  . levothyroxine  112 mcg Oral Q0600  . metoprolol succinate  50 mg Oral Daily  . pantoprazole  40 mg Oral Daily  . polyethylene glycol  17 g Oral Daily    Discontinued Meds:   Medications Discontinued During This Encounter  Medication Reason  . enoxaparin (LOVENOX) injection 30 mg   . sodium polystyrene (KAYEXALATE) 15 GM/60ML suspension 30 g   . apixaban (ELIQUIS) tablet 5 mg   . furosemide (LASIX) injection 40 mg   . metoprolol tartrate (LOPRESSOR) tablet 25 mg   . furosemide (LASIX) injection 40 mg   . furosemide (LASIX) injection 40 mg   . furosemide (LASIX) injection 20 mg   . furosemide (LASIX) injection 20 mg     Social History:  reports that she quit smoking about 31 years ago. Her smoking use included cigarettes. She has a 20.00 pack-year smoking history. She has never used smokeless tobacco. She reports that she does not drink alcohol and does not use drugs.  Family History:   Family History  Problem Relation Age of Onset  . Heart disease Mother   . Stroke Sister   . Heart disease Brother   . Atrial fibrillation Sister   . Coronary artery disease Brother   . Lung cancer Son   . Healthy Son   . Colon cancer Neg Hx     Pertinent items are noted in HPI. Weight change:    Intake/Output Summary (Last 24 hours) at 04/09/2020 0938 Last data filed at 04/09/2020 0900 Gross per 24 hour  Intake 480 ml  Output --  Net 480 ml   BP 104/73 (BP Location: Left Arm)   Pulse 99   Temp (!) 97.3 F (36.3 C)   Resp 19   Ht 5\' 6"  (1.676 m)   Wt 71 kg   SpO2 96%   BMI 25.26 kg/m  Vitals:   04/08/20 1454 04/08/20 2027 04/09/20 0452 04/09/20 0500  BP: 114/72 102/87 104/73   Pulse: 99 94 99   Resp: 18 20 19    Temp: 97.8 F (36.6 C) 98.2 F (36.8 C) (!) 97.3 F (36.3 C)   TempSrc:      SpO2: 98% 97% 96%   Weight:    71 kg  Height:         General appearance: alert, cooperative and no distress Head: Normocephalic, without obvious abnormality, atraumatic Eyes: negative findings: lids and lashes normal, conjunctivae and sclerae normal and corneas clear Resp: diminished breath sounds bibasilar Cardio: irregularly irregular rhythm and no rub GI: soft, non-tender; bowel sounds normal; no masses,  no organomegaly Extremities: edema trace-1+ pretibial edema bilaterally  Labs: Basic Metabolic Panel: Recent Labs  Lab 04/03/20 0401 04/04/20 0541 04/05/20 0438 04/06/20 0503 04/07/20 0500 04/08/20 0549 04/09/20 0447  NA 138  136 136 135 133* 132* 130* 132*  K 2.7*  2.7* 3.6 4.3 4.0 4.9 4.7 3.7  CL 98  98 99 97* 96* 93* 92* 95*  CO2 27  25 26 27 23 22 24 25   GLUCOSE 97  97 109* 114* 77 75 100* 103*  BUN 32*  33* 28* 32* 49* 68* 84* 72*  CREATININE 1.61*  1.60* 1.36* 1.45* 1.89* 2.77* 2.91* 1.91*  ALBUMIN 3.5  3.5 3.5 3.1*  --  3.2*  --   --   CALCIUM 8.3*  8.2* 8.5* 8.4*  8.2* 8.4* 8.1* 8.2*  PHOS 3.9 3.3 3.6  --  7.3*  --   --    Liver Function Tests: Recent Labs  Lab 04/03/20 0401 04/04/20 0541 04/05/20 0438 04/07/20 0500  AST 32  --   --   --   ALT 24  --   --   --   ALKPHOS 96  --   --   --   BILITOT 1.1  --   --   --   PROT 6.4*  --   --   --   ALBUMIN 3.5  3.5 3.5 3.1* 3.2*   No results for input(s): LIPASE, AMYLASE in the last 168  hours. No results for input(s): AMMONIA in the last 168 hours. CBC: Recent Labs  Lab 04/04/20 0606 04/06/20 0503 04/07/20 0500 04/09/20 0447  WBC 11.8* 8.1 10.5 8.5  HGB 7.9* 7.4* 8.8* 8.5*  HCT 26.9* 24.8* 29.7* 28.0*  MCV 86.5 83.5 82.0 81.4  PLT 347 368 435* 401*   PT/INR: @LABRCNTIP (inr:5) Cardiac Enzymes: )No results for input(s): CKTOTAL, CKMB, CKMBINDEX, TROPONINI in the last 168 hours. CBG: No results for input(s): GLUCAP in the last 168 hours.  Iron Studies: No results for input(s): IRON, TIBC, TRANSFERRIN, FERRITIN in the last 168 hours.  Xrays/Other Studies: No results found.   Assessment/Plan: 1.  AKI/CKD stage IIIa - presumably combination of cardiorenal syndrome +/- ischemic ATN in setting of acute decompensated systolic and diastolic CHF, as well as anemia and hypotension with IV diuresis.  Thankfully her Scr and UOP have already started to improve.  She is not on an ACE/ARB.  Unfortunately all of the urine studies ordered yesterday were not performed.  Will send today.  Nothing further to add at this time and will arrange for outpatient follow up with our office in [place].  2. Acute hypoxic respiratory failure - due to acute systolic CHF.  Improved after bilateral thoracenteses and diuresis. 3. Acute on chronic combined systolic and diastolic CHF - symptoms have improved and she has continued to diurese off of IV lasix.  Ok to resume torsemide 20 mg po bid tomorrow. 4. Permanent atrial fibrillation - currently on toprol xl 50 mg daily with improved HR and BP.  Eliquis per Cardiology 5. Iron deficient Anemia +/- chronic disease (Rheumatoid Arthritis and CKD stage IIIa).  Improved after blood transfusion.  Will also check iron stores. 6. Severe mitral regurgitation - per Cardiology.   7. Hypothyroidism - continue replacement therapy. 8. Disposition - hopeful discharge tomorrow.  Will not see over the weekend unless condition changes.  Will arrange for follow up  in our [place] office in 3-4 weeks.   Governor Rooks Jovonte Commins 04/09/2020, 9:38 AM

## 2020-04-10 LAB — CULTURE, BODY FLUID W GRAM STAIN -BOTTLE: Culture: NO GROWTH

## 2020-04-11 LAB — CULTURE, BODY FLUID W GRAM STAIN -BOTTLE: Culture: NO GROWTH

## 2020-04-12 ENCOUNTER — Other Ambulatory Visit: Payer: Self-pay | Admitting: *Deleted

## 2020-04-12 NOTE — Patient Outreach (Signed)
[facility]) Care Management  04/12/2020  Erin Schmidt 1932-12-09 [id number]   Outgoing call placed to member, no answer to listed home number, recording state member not available at this time.  Call also placed to listed mobile number, HIPAA compliant voice message left.  Will follow up within the next 3-4 business days.  Valente David, [place], MSN [place] [phone]

## 2020-04-13 DIAGNOSIS — I509 Heart failure, unspecified: Secondary | ICD-10-CM | POA: Diagnosis not present

## 2020-04-13 DIAGNOSIS — I5043 Acute on chronic combined systolic (congestive) and diastolic (congestive) heart failure: Secondary | ICD-10-CM | POA: Diagnosis not present

## 2020-04-13 DIAGNOSIS — J9601 Acute respiratory failure with hypoxia: Secondary | ICD-10-CM | POA: Diagnosis not present

## 2020-04-14 DIAGNOSIS — M069 Rheumatoid arthritis, unspecified: Secondary | ICD-10-CM | POA: Diagnosis not present

## 2020-04-14 DIAGNOSIS — I4891 Unspecified atrial fibrillation: Secondary | ICD-10-CM | POA: Diagnosis not present

## 2020-04-14 DIAGNOSIS — D649 Anemia, unspecified: Secondary | ICD-10-CM | POA: Diagnosis not present

## 2020-04-14 DIAGNOSIS — I509 Heart failure, unspecified: Secondary | ICD-10-CM | POA: Diagnosis not present

## 2020-04-15 ENCOUNTER — Other Ambulatory Visit: Payer: Self-pay | Admitting: *Deleted

## 2020-04-15 NOTE — Patient Outreach (Signed)
[facility]) Care Management  04/15/2020  Erin Schmidt 1932/07/03 [id number]   Outreach attempt #2, successful.  Report she has been "totally wiped out" since her most recent admission however state she has been slowly improving.  She went to her PCP yesterday, will follow up in 6 weeks.  She was initially supposed to have outpatient PT but feels this would be too much.  MD placed order for home health PT.  She continue to report swelling from her hips down to her feet, taking diuretics as instructed.  Weight today was 151 pounds, will follow up with cardiology on 4/12.  She was also supposed to follow up with nephrologist, does not have appointment but will call to schedule.  Denies any urgent concerns, encouraged to contact this care manager with questions.  Agrees to follow up within the next month.  Goals Addressed            This Visit's Progress   . [facility] - Track and Manage Activity and Exertion-Heart Failure   On track    Timeframe:  Long-Range Goal Priority:  Medium Start Date:     4/7                  Expected End Date:       6/7     - make an activity or exercise plan - track exercise in diary for two weeks - track symptoms during activity in diary    Why is this important?    Exercising is very important when managing your heart failure.   It will help your heart get stronger.    Notes:   3/17 - Encouraged to increase activity as tolerated  4/7 - Discussed home health rehab to increase activity, awaiting call back    . [facility] - Track and Manage Fluids and Swelling-Heart Failure   On track    Timeframe:  Short-Term Goal Priority:  High Start Date:     4/7                  Expected End Date:       5/7                - call office if I gain more than 2 pounds in one day or 5 pounds in one week - track weight in diary - watch for swelling in feet, ankles and legs every day - weigh myself daily    Why is this important?    It is important to  check your weight daily and watch how much salt and liquids you have.   It will help you to manage your heart failure.    Notes:   3/17 - Reviewed weight changes over the past week (lowest 139.8, highest today 146)  4/7 - Weights reviewed, today was 151 pounds.  Continue to have swelling, will follow up with cardiology next week    . [facility] - Track and Manage Heart Rate and Rhythm-Atrial Fibrillation   On track    Timeframe:  Short-Term Goal Priority:  Medium Start Date:        4/7             Expected End Date:    5/7                 - check pulse (heart) rate once a day - make a plan to eat healthy - keep all lab appointments - take medicine as prescribed  Why is this important?    Atrial fibrillation may have no symptoms. Sometimes the symptoms get worse or happen more often.   It is important to keep track of what your symptoms are and when they happen.   A change in symptoms is important to discuss with your doctor or nurse.   Being active and healthy eating will also help you manage your heart condition.     Notes:   4/7 - Medication change reviewed (from Cardizem to Metoprolol)      Valente David, RN, MSN [place] [phone]

## 2020-04-16 ENCOUNTER — Telehealth (HOSPITAL_COMMUNITY): Payer: Self-pay | Admitting: Physical Therapy

## 2020-04-16 NOTE — Telephone Encounter (Signed)
Dr. Hilma Favors  will send out a referral for Home Health - patient is unable to come to our office. Husband request that referral be closed

## 2020-04-20 ENCOUNTER — Ambulatory Visit: Payer: Medicare Other | Admitting: Student

## 2020-04-20 ENCOUNTER — Encounter: Payer: Self-pay | Admitting: Student

## 2020-04-20 ENCOUNTER — Other Ambulatory Visit: Payer: Self-pay

## 2020-04-20 VITALS — BP 100/60 | HR 114 | Ht 66.5 in | Wt 156.0 lb

## 2020-04-20 DIAGNOSIS — I5043 Acute on chronic combined systolic (congestive) and diastolic (congestive) heart failure: Secondary | ICD-10-CM

## 2020-04-20 DIAGNOSIS — I4819 Other persistent atrial fibrillation: Secondary | ICD-10-CM

## 2020-04-20 DIAGNOSIS — D649 Anemia, unspecified: Secondary | ICD-10-CM

## 2020-04-20 DIAGNOSIS — I340 Nonrheumatic mitral (valve) insufficiency: Secondary | ICD-10-CM | POA: Diagnosis not present

## 2020-04-20 DIAGNOSIS — Z79899 Other long term (current) drug therapy: Secondary | ICD-10-CM | POA: Diagnosis not present

## 2020-04-20 DIAGNOSIS — N1832 Chronic kidney disease, stage 3b: Secondary | ICD-10-CM | POA: Diagnosis not present

## 2020-04-20 MED ORDER — TORSEMIDE 20 MG PO TABS: ORAL_TABLET | ORAL | 3 refills | Status: DC

## 2020-04-20 NOTE — Patient Instructions (Signed)
Medication Instructions:  Your physician has recommended you make the following change in your medication:   Take Torsemide 40 mg in the AM and 20 mg in the Evening  Call and update on weights and swelling on Friday   *If you need a refill on your cardiac medications before your next appointment, please call your pharmacy*   Lab Work: Your physician recommends that you return for lab work on: 04/27/20 BMET  If you have labs (blood work) drawn today and your tests are completely normal, you will receive your results only by: Marland Kitchen MyChart Message (if you have MyChart) OR . A paper copy in the mail If you have any lab test that is abnormal or we need to change your treatment, we will call you to review the results.   Testing/Procedures: NONE    Follow-Up: At [facility], you and your health needs are our priority.  As part of our continuing mission to provide you with exceptional heart care, we have created designated Provider Care Teams.  These Care Teams include your primary Cardiologist (physician) and Advanced Practice Providers (APPs -  Physician Assistants and Nurse Practitioners) who all work together to provide you with the care you need, when you need it.  We recommend signing up for the patient portal called "MyChart".  Sign up information is provided on this After Visit Summary.  MyChart is used to connect with patients for Virtual Visits (Telemedicine).  Patients are able to view lab/test results, encounter notes, upcoming appointments, etc.  Non-urgent messages can be sent to your provider as well.   To learn more about what you can do with MyChart, go to [email or web address].    Your next appointment:   2 week(s)  The format for your next appointment:   In Person  Provider:   Carlyle Dolly, MD or Bernerd Pho, PA-C   Other Instructions Thank you for choosing [facility]!

## 2020-04-20 NOTE — Progress Notes (Signed)
Cardiology Office Note    Date:  04/20/2020   ID:  Erin Schmidt, DOB 11/08/1932, MRN [id number]  PCP:  Sharilyn Sites, MD  Cardiologist: Carlyle Dolly, MD    Chief Complaint  Patient presents with  . Hospitalization Follow-up    History of Present Illness:    Erin Schmidt is a 87 y.o. female with past medical historyof persistent atrial fibrillation(s/p DCCVin 11/2019 with recurrence at follow-up, repeat DCCV in 02/2020 following initiation of Amiodarone and recurrence at follow-up --> rate-control pursued since),moderate MR,HTN, hypothyroidism and iron deficiency anemia who presents to the office today for hospital follow-up.  She was most recently admitted to [facility] from 3/25 - 04/09/2020 for an acute CHF exacerbation complicated by an AKI.  636 on admission and repeat echocardiogram showed her EF was mildly reduced at 45 to 50% and she was noted to have severe MR by repeat echocardiogram. Cardizem CD was switched to Toprol-XL given her cardiomyopathy. She underwent bilateral thoracentesis procedures with over 1900 mL of fluid removed and responded well with IV Lasix. Her creatinine did peak at 2.91 during admission and had improved to 1.91 on the day of hospital discharge. She was discharged home with Torsemide 20 mg twice daily and follow-up with Nephrology was recommended. It was recommended to repeat an echocardiogram as an outpatient for reassessment of her mitral regurgitation. Of note, she was also treated for her anemia during admission as well and received 1 unit PRBC's.  In talking with the patient and her husband today, she reports overall feeling weak and fatigued since hospital discharge. They have been in communication with her PCP to arrange for Home Health PT. She reports worsening lower extremity edema and weakness along her legs. Had a fall this past weekend. She reports some orthopnea and has been sleeping on the couch. No recent chest pain or  palpitations. Her weight on her home scales upon returning home was 148 lbs and is at 153 lbs on her home scales today (at 156 lbs on office scales). Her HR has been in the 90's to 110's at rest and BP has been soft with SBP typically in the 80's to 90's.    Past Medical History:  Diagnosis Date  . Arthritis   . Atrial fibrillation ([place])   . Essential hypertension   . GERD (gastroesophageal reflux disease)   . Hyperlipemia   . Hypothyroidism   . Iron deficiency anemia 05/07/2019  . Migraine   . Mitral regurgitation   . Vertigo     Past Surgical History:  Procedure Laterality Date  . APPENDECTOMY    . BREAST BIOPSY Right    benign  . BREAST EXCISIONAL BIOPSY Left    Benign  . BREAST EXCISIONAL BIOPSY Left    Benign  . BREAST LUMPECTOMY  30 yrs ago   Dr. Milbert Coulter- rt breast  . BREAST LUMPECTOMY  15 yrs ago   left- Dr. Romona Curls  . BREAST LUMPECTOMY  01/2011  . CARDIOVERSION N/A 11/10/2019   Procedure: CARDIOVERSION;  Surgeon: Arnoldo Lenis, MD;  Location: [facility];  Service: Endoscopy;  Laterality: N/A;  . CARDIOVERSION N/A 02/19/2020   Procedure: CARDIOVERSION;  Surgeon: Arnoldo Lenis, MD;  Location: [facility];  Service: Endoscopy;  Laterality: N/A;  . CHOLECYSTECTOMY    . COLONOSCOPY    . COLONOSCOPY  04/13/2011   Procedure: COLONOSCOPY;  Surgeon: Rogene Houston, MD;  Location: [facility];  Service: Endoscopy;  Laterality: N/A;  930  . MASTECTOMY,  PARTIAL  01/13/2011   Procedure: MASTECTOMY PARTIAL;  Surgeon: Adin Hector, MD;  Location: [place];  Service: General;  Laterality: Left;  left partial mastectomy with needle locallization  . VAGINAL HYSTERECTOMY     2 partials    Current Medications: Outpatient Medications Prior to Visit  Medication Sig Dispense Refill  . Abatacept (ORENCIA CLICKJECT) 732 MG/ML SOAJ Inject 125 mg into the skin every 7 (seven) days. 3.92 mL 2  . apixaban (ELIQUIS) 2.5 MG TABS tablet Take 1 tablet (2.5 mg total) by mouth 2  (two) times daily. 60 tablet 2  . Calcium Carb-Cholecalciferol (CALCIUM 600 + D PO) Take 1 tablet by mouth 2 (two) times daily.    . cetirizine (ZYRTEC) 10 MG tablet Take 10 mg by mouth daily.    . cholecalciferol (VITAMIN D3) 25 MCG (1000 UNIT) tablet Take 1,000 Units by mouth daily.    Marland Kitchen escitalopram (LEXAPRO) 20 MG tablet Take 20 mg by mouth daily.    Marland Kitchen ketotifen (ZADITOR) 0.025 % ophthalmic solution Place 1 drop into both eyes 2 (two) times daily as needed (allergies).    Marland Kitchen levothyroxine (SYNTHROID, LEVOTHROID) 112 MCG tablet Take 112 mcg by mouth daily before breakfast.     . Melatonin 10 MG CAPS Take 10 mg by mouth at bedtime.    . metoprolol succinate (TOPROL-XL) 50 MG 24 hr tablet Take 1 tablet (50 mg total) by mouth daily. Take with or immediately following a meal. 30 tablet 2  . omeprazole (PRILOSEC) 20 MG capsule Take 20 mg by mouth every Monday, Wednesday, and Friday.    . potassium chloride SA (KLOR-CON M20) 20 MEQ tablet Take 2 tablets (40 mEq total) by mouth daily. 120 tablet 3  . torsemide (DEMADEX) 20 MG tablet Take 1 tablet (20 mg total) by mouth 2 (two) times daily. 60 tablet 2  . escitalopram (LEXAPRO) 10 MG tablet Take 10 mg by mouth daily.     No facility-administered medications prior to visit.     Allergies:   Feraheme [ferumoxytol]   Social History   Socioeconomic History  . Marital status: Married    Spouse name: Not on file  . Number of children: 2  . Years of education: Not on file  . Highest education level: Not on file  Occupational History  . Occupation: RETIRED  Tobacco Use  . Smoking status: Former Smoker    Packs/day: 1.00    Years: 20.00    Pack years: 20.00    Types: Cigarettes    Quit date: 01/05/1989    Years since quitting: 31.3  . Smokeless tobacco: Never Used  Vaping Use  . Vaping Use: Never used  Substance and Sexual Activity  . Alcohol use: No  . Drug use: Never  . Sexual activity: Not on file  Other Topics Concern  . Not on file   Social History Narrative  . Not on file   Social Determinants of Health   Financial Resource Strain: Not on file  Food Insecurity: No Food Insecurity  . Worried About Charity fundraiser in the Last Year: Never true  . Ran Out of Food in the Last Year: Never true  Transportation Needs: No Transportation Needs  . Lack of Transportation (Medical): No  . Lack of Transportation (Non-Medical): No  Physical Activity: Not on file  Stress: Not on file  Social Connections: Not on file     Family History:  The patient's family history includes Atrial fibrillation in her  sister; Coronary artery disease in her brother; Healthy in her son; Heart disease in her brother and mother; Lung cancer in her son; Stroke in her sister.   Review of Systems:   Please see the history of present illness.     General:  No fever, night sweats. Positive for fatigue and weight gain.  Cardiovascular:  No chest pain, dyspnea on exertion, palpitations, paroxysmal nocturnal dyspnea. Positive for orthopnea and edema.  Dermatological: No rash, lesions/masses Respiratory: No cough, dyspnea Urologic: No hematuria, dysuria Abdominal:   No nausea, vomiting, diarrhea, bright red blood per rectum, melena, or hematemesis Neurologic:  No visual changes, wkns, changes in mental status. All other systems reviewed and are otherwise negative except as noted above.   Physical Exam:    VS:  BP 100/60   Pulse (!) 114   Ht 5' 6.5" (1.689 m)   Wt 156 lb (70.8 kg)   SpO2 97%   BMI 24.80 kg/m    General: Well developed, well nourished,female appearing in no acute distress. Head: Normocephalic, atraumatic. Neck: No carotid bruits. JVD not elevated.  Lungs: Respirations regular and unlabored, without wheezes or rales.  Heart: Irregularly irregular. No S3 or S4.  2/6 systolic murmur along Apex.  Abdomen: Appears non-distended. No obvious abdominal masses. Msk:  Strength and tone appear normal for age. No obvious joint  deformities or effusions. Extremities: No clubbing or cyanosis. 2+ pitting edema up to knees bilaterally.  Distal pedal pulses are 2+ bilaterally. Neuro: Alert and oriented X 3. Moves all extremities spontaneously. No focal deficits noted. Psych:  Responds to questions appropriately with a normal affect. Skin: No rashes or lesions noted  Wt Readings from Last 3 Encounters:  04/20/20 156 lb (70.8 kg)  04/09/20 156 lb 8.4 oz (71 kg)  03/09/20 145 lb 6.4 oz (66 kg)     Studies/Labs Reviewed:   EKG:  EKG is not ordered today.   Recent Labs: 12/01/2019: TSH 3.486 04/02/2020: B Natriuretic Peptide 636.0 04/03/2020: ALT 24 04/06/2020: Magnesium 2.5 04/09/2020: BUN 72; Creatinine, Ser 1.91; Hemoglobin 8.5; Platelets 401; Potassium 3.7; Sodium 132   Lipid Panel No results found for: CHOL, TRIG, HDL, CHOLHDL, VLDL, LDLCALC, LDLDIRECT  Additional studies/ records that were reviewed today include:   Echocardiogram: 03/2020 IMPRESSIONS    1. Hypokinesis of the inferior wall (base, mid). Left ventricular  ejection fraction, by estimation, is 45 to 50%. The left ventricle has  mildly decreased function. There is mild left ventricular hypertrophy.  Left ventricular diastolic parameters are  indeterminate.  2. Right ventricular systolic function is low normal. The right  ventricular size is mildly enlarged. There is moderately elevated  pulmonary artery systolic pressure.  3. Left atrial size was mildly dilated.  4. Right atrial size was moderately dilated.  5. Large pleural effusion.  6. There are a couple MR jets; the largest is eccentric directed  posteriorly along posterior wall of LA COmpared to previous report, MR is  more severe. . The mitral valve is abnormal. Severe mitral valve  regurgitation.  7. Tricuspid valve regurgitation is moderate.  8. The aortic valve is tricuspid. Aortic valve regurgitation is not  visualized. Mild to moderate aortic valve  sclerosis/calcification is  present, without any evidence of aortic stenosis.  9. The inferior vena cava is dilated in size with <50% respiratory  variability, suggesting right atrial pressure of 15 mmHg.   Assessment:    1. Acute on chronic combined systolic and diastolic CHF (congestive heart failure) ([place])  2. Persistent atrial fibrillation ([place])   3. Mitral valve insufficiency, unspecified etiology   4. Medication management   5. Stage 3b chronic kidney disease ([place])   6. Normocytic anemia      Plan:   In order of problems listed above:  1. Acute on Chronic Combined Systolic and Diastolic CHF - Her EF was mildly reduced at 45-50% by echo during her recent admission and unfortunately she has developed significantly worsening edema and orthopnea since hospital discharge with at least a 6 lb weight gain in 2 weeks (appears to have more than 6 lbs of fluid by examination).  - She is currently on Torsemide 20mg  BID as this was reduced during her recent admission secondary to her AKI. Will titrate to 40mg  in AM/20mg  in PM and have her call to report on weight and symptoms later this week. Repeat BMET next week. Continue Toprol-XL 50mg  daily. Unable to add ACE-I/ARB/ARNI/Spironolactone due to BP and variable renal function.   2. Persistent Atrial Fibrillation  - She underwent DCCVin 11/2019 with recurrence at follow-up and repeat DCCV in 02/2020 following initiation of Amiodarone and recurrence at follow-up with rate-control pursued since. Previously evaluated by Dr. Lovena Le and Amiodarone was discontinued. I reviewed her case with Dr. Harl Bowie today as her HR has been in the 110's at rest but SBP in the 80's to 90's at home and does not allow for further titration of Toprol-XL. He recommended I reach out to Dr. Lovena Le in regards to restarting Amio for rate-control alone or exploring other antiarrhythmic options. Not an ideal candidate for Digoxin either given her recent AKI.  - She remains on  Eliquis 2.5mg  BID (reduced dosing for now given her age and renal function).   3. Moderate MR - Her mitral regurgitation was moderate to severe by recent echo. Reviewed with Dr. Harl Bowie as well and he recommended to consider a TEE for closer evaluation once her volume status has improved. Will have her follow-up in 2 weeks for reassessment and possibly schedule at that time.   4. Acute on Chronic Stage 3 CKD - Her baseline creatinine has been at 1.2 - 1.3. Peaked at 2.91 during admission and at 1.91 upon hospital discharge. Will recheck BMET next week following titration of Torsemide. I did encourage her to arrange follow-up with Nephrology.   5. Anemia - Hgb was stable at 8.5 upon hospital discharge. She does have scheduled follow-up with Hematology in 2 weeks.    Medication Adjustments/Labs and Tests Ordered: Current medicines are reviewed at length with the patient today.  Concerns regarding medicines are outlined above.  Medication changes, Labs and Tests ordered today are listed in the Patient Instructions below. Patient Instructions  Medication Instructions:  Your physician has recommended you make the following change in your medication:   Take Torsemide 40 mg in the AM and 20 mg in the Evening  Call and update on weights and swelling on Friday   *If you need a refill on your cardiac medications before your next appointment, please call your pharmacy*   Lab Work: Your physician recommends that you return for lab work on: 04/27/20 BMET  If you have labs (blood work) drawn today and your tests are completely normal, you will receive your results only by: Marland Kitchen MyChart Message (if you have MyChart) OR . A paper copy in the mail If you have any lab test that is abnormal or we need to change your treatment, we will call you to review the results.  Testing/Procedures: NONE    Follow-Up: At [facility], you and your health needs are our priority.  As part of our continuing mission  to provide you with exceptional heart care, we have created designated Provider Care Teams.  These Care Teams include your primary Cardiologist (physician) and Advanced Practice Providers (APPs -  Physician Assistants and Nurse Practitioners) who all work together to provide you with the care you need, when you need it.  We recommend signing up for the patient portal called "MyChart".  Sign up information is provided on this After Visit Summary.  MyChart is used to connect with patients for Virtual Visits (Telemedicine).  Patients are able to view lab/test results, encounter notes, upcoming appointments, etc.  Non-urgent messages can be sent to your provider as well.   To learn more about what you can do with MyChart, go to [email or web address].    Your next appointment:   2 week(s)  The format for your next appointment:   In Person  Provider:   Carlyle Dolly, MD or Bernerd Pho, PA-C   Other Instructions Thank you for choosing [facility]!       Signed, Erma Heritage, PA-C  04/20/2020 7:43 PM    [facility] [address] [place], [place] [address] Phone: [phone] Fax: [phone]

## 2020-04-23 ENCOUNTER — Telehealth: Payer: Self-pay | Admitting: Student

## 2020-04-23 DIAGNOSIS — Z79899 Other long term (current) drug therapy: Secondary | ICD-10-CM

## 2020-04-23 NOTE — Telephone Encounter (Signed)
Patient called to update our office on her weights throughout the week and they are as follows:  4/12- 186.6 lb 4/13- 185.6 lb 4/14- 186.0 lb 4/15- 186.0 lb

## 2020-04-23 NOTE — Telephone Encounter (Signed)
    Please verify those weights as it was at 156 lbs earlier this week. I am assuring the "8" was a typo. Please let the patient know I am waiting to hear back Dr. Lovena Le in regards to possibly restarting her Amiodarone.   Thanks,  [place]

## 2020-04-23 NOTE — Telephone Encounter (Signed)
Patient called stating that she was told to contact office in regards to her vitals. States that is feels like she is pretty stable.

## 2020-04-26 ENCOUNTER — Other Ambulatory Visit (HOSPITAL_COMMUNITY): Payer: Self-pay

## 2020-04-26 DIAGNOSIS — D500 Iron deficiency anemia secondary to blood loss (chronic): Secondary | ICD-10-CM

## 2020-04-26 DIAGNOSIS — D649 Anemia, unspecified: Secondary | ICD-10-CM

## 2020-04-26 NOTE — Telephone Encounter (Signed)
I contacted patient this morning and verified weight. Per patient, 8's are supposed to be 5's.

## 2020-04-27 ENCOUNTER — Inpatient Hospital Stay (HOSPITAL_COMMUNITY): Payer: Medicare Other | Attending: Hematology

## 2020-04-27 ENCOUNTER — Other Ambulatory Visit: Payer: Self-pay

## 2020-04-27 DIAGNOSIS — G9341 Metabolic encephalopathy: Secondary | ICD-10-CM | POA: Diagnosis not present

## 2020-04-27 DIAGNOSIS — D500 Iron deficiency anemia secondary to blood loss (chronic): Secondary | ICD-10-CM

## 2020-04-27 DIAGNOSIS — J90 Pleural effusion, not elsewhere classified: Secondary | ICD-10-CM | POA: Diagnosis not present

## 2020-04-27 DIAGNOSIS — I4819 Other persistent atrial fibrillation: Secondary | ICD-10-CM | POA: Diagnosis not present

## 2020-04-27 DIAGNOSIS — E872 Acidosis: Secondary | ICD-10-CM | POA: Diagnosis not present

## 2020-04-27 DIAGNOSIS — N179 Acute kidney failure, unspecified: Secondary | ICD-10-CM | POA: Diagnosis not present

## 2020-04-27 DIAGNOSIS — K7200 Acute and subacute hepatic failure without coma: Secondary | ICD-10-CM | POA: Diagnosis not present

## 2020-04-27 DIAGNOSIS — D509 Iron deficiency anemia, unspecified: Secondary | ICD-10-CM | POA: Diagnosis not present

## 2020-04-27 DIAGNOSIS — I517 Cardiomegaly: Secondary | ICD-10-CM | POA: Diagnosis not present

## 2020-04-27 DIAGNOSIS — I452 Bifascicular block: Secondary | ICD-10-CM | POA: Diagnosis not present

## 2020-04-27 DIAGNOSIS — I5043 Acute on chronic combined systolic (congestive) and diastolic (congestive) heart failure: Secondary | ICD-10-CM | POA: Diagnosis not present

## 2020-04-27 DIAGNOSIS — E785 Hyperlipidemia, unspecified: Secondary | ICD-10-CM | POA: Diagnosis not present

## 2020-04-27 DIAGNOSIS — J9621 Acute and chronic respiratory failure with hypoxia: Secondary | ICD-10-CM | POA: Diagnosis not present

## 2020-04-27 DIAGNOSIS — Z515 Encounter for palliative care: Secondary | ICD-10-CM | POA: Diagnosis not present

## 2020-04-27 DIAGNOSIS — E039 Hypothyroidism, unspecified: Secondary | ICD-10-CM | POA: Diagnosis not present

## 2020-04-27 DIAGNOSIS — N1832 Chronic kidney disease, stage 3b: Secondary | ICD-10-CM | POA: Diagnosis not present

## 2020-04-27 DIAGNOSIS — R21 Rash and other nonspecific skin eruption: Secondary | ICD-10-CM | POA: Diagnosis not present

## 2020-04-27 DIAGNOSIS — Z20822 Contact with and (suspected) exposure to covid-19: Secondary | ICD-10-CM | POA: Diagnosis not present

## 2020-04-27 DIAGNOSIS — Z9981 Dependence on supplemental oxygen: Secondary | ICD-10-CM | POA: Diagnosis not present

## 2020-04-27 DIAGNOSIS — I4891 Unspecified atrial fibrillation: Secondary | ICD-10-CM | POA: Diagnosis not present

## 2020-04-27 DIAGNOSIS — I130 Hypertensive heart and chronic kidney disease with heart failure and stage 1 through stage 4 chronic kidney disease, or unspecified chronic kidney disease: Secondary | ICD-10-CM | POA: Diagnosis not present

## 2020-04-27 DIAGNOSIS — M0609 Rheumatoid arthritis without rheumatoid factor, multiple sites: Secondary | ICD-10-CM | POA: Diagnosis not present

## 2020-04-27 DIAGNOSIS — E162 Hypoglycemia, unspecified: Secondary | ICD-10-CM | POA: Diagnosis not present

## 2020-04-27 DIAGNOSIS — I340 Nonrheumatic mitral (valve) insufficiency: Secondary | ICD-10-CM | POA: Diagnosis not present

## 2020-04-27 DIAGNOSIS — D631 Anemia in chronic kidney disease: Secondary | ICD-10-CM | POA: Diagnosis not present

## 2020-04-27 DIAGNOSIS — R0602 Shortness of breath: Secondary | ICD-10-CM | POA: Diagnosis not present

## 2020-04-27 DIAGNOSIS — E11649 Type 2 diabetes mellitus with hypoglycemia without coma: Secondary | ICD-10-CM | POA: Diagnosis not present

## 2020-04-27 DIAGNOSIS — E875 Hyperkalemia: Secondary | ICD-10-CM | POA: Diagnosis not present

## 2020-04-27 DIAGNOSIS — D649 Anemia, unspecified: Secondary | ICD-10-CM

## 2020-04-27 DIAGNOSIS — K219 Gastro-esophageal reflux disease without esophagitis: Secondary | ICD-10-CM | POA: Diagnosis not present

## 2020-04-27 DIAGNOSIS — R001 Bradycardia, unspecified: Secondary | ICD-10-CM | POA: Diagnosis not present

## 2020-04-27 DIAGNOSIS — Z66 Do not resuscitate: Secondary | ICD-10-CM | POA: Diagnosis not present

## 2020-04-27 LAB — COMPREHENSIVE METABOLIC PANEL
ALT: 32 U/L (ref 0–44)
AST: 33 U/L (ref 15–41)
Albumin: 3.3 g/dL — ABNORMAL LOW (ref 3.5–5.0)
Alkaline Phosphatase: 162 U/L — ABNORMAL HIGH (ref 38–126)
Anion gap: 13 (ref 5–15)
BUN: 58 mg/dL — ABNORMAL HIGH (ref 8–23)
CO2: 23 mmol/L (ref 22–32)
Calcium: 8.6 mg/dL — ABNORMAL LOW (ref 8.9–10.3)
Chloride: 97 mmol/L — ABNORMAL LOW (ref 98–111)
Creatinine, Ser: 2.44 mg/dL — ABNORMAL HIGH (ref 0.44–1.00)
GFR, Estimated: 19 mL/min — ABNORMAL LOW (ref >60)
Glucose, Bld: 90 mg/dL (ref 70–99)
Potassium: 5.3 mmol/L — ABNORMAL HIGH (ref 3.5–5.1)
Sodium: 133 mmol/L — ABNORMAL LOW (ref 135–145)
Total Bilirubin: 1.4 mg/dL — ABNORMAL HIGH (ref 0.3–1.2)
Total Protein: 6.3 g/dL — ABNORMAL LOW (ref 6.5–8.1)

## 2020-04-27 LAB — CBC WITH DIFFERENTIAL/PLATELET
Abs Immature Granulocytes: 0.02 10*3/uL (ref 0.00–0.07)
Basophils Absolute: 0 10*3/uL (ref 0.0–0.1)
Basophils Relative: 0 %
Eosinophils Absolute: 0.1 10*3/uL (ref 0.0–0.5)
Eosinophils Relative: 2 %
HCT: 28.8 % — ABNORMAL LOW (ref 36.0–46.0)
Hemoglobin: 8.3 g/dL — ABNORMAL LOW (ref 12.0–15.0)
Immature Granulocytes: 0 %
Lymphocytes Relative: 23 %
Lymphs Abs: 1.7 10*3/uL (ref 0.7–4.0)
MCH: 23.2 pg — ABNORMAL LOW (ref 26.0–34.0)
MCHC: 28.8 g/dL — ABNORMAL LOW (ref 30.0–36.0)
MCV: 80.4 fL (ref 80.0–100.0)
Monocytes Absolute: 0.9 10*3/uL (ref 0.1–1.0)
Monocytes Relative: 13 %
Neutro Abs: 4.4 10*3/uL (ref 1.7–7.7)
Neutrophils Relative %: 62 %
Platelets: 346 10*3/uL (ref 150–400)
RBC: 3.58 MIL/uL — ABNORMAL LOW (ref 3.87–5.11)
RDW: 20.7 % — ABNORMAL HIGH (ref 11.5–15.5)
WBC: 7.2 10*3/uL (ref 4.0–10.5)
nRBC: 0.6 % — ABNORMAL HIGH (ref 0.0–0.2)

## 2020-04-27 LAB — IRON AND TIBC
Iron: 16 ug/dL — ABNORMAL LOW (ref 28–170)
Saturation Ratios: 3 % — ABNORMAL LOW (ref 10.4–31.8)
TIBC: 521 ug/dL — ABNORMAL HIGH (ref 250–450)
UIBC: 505 ug/dL

## 2020-04-27 LAB — FERRITIN: Ferritin: 15 ng/mL (ref 11–307)

## 2020-04-27 MED ORDER — POTASSIUM CHLORIDE ER 20 MEQ PO TBCR: 20.0000 meq | EXTENDED_RELEASE_TABLET | Freq: Two times a day (BID) | ORAL | 3 refills | Status: AC

## 2020-04-27 MED ORDER — TORSEMIDE 20 MG PO TABS: 20.0000 mg | ORAL_TABLET | Freq: Two times a day (BID) | ORAL | 3 refills | Status: AC

## 2020-04-27 NOTE — Telephone Encounter (Signed)
    I called the patient earlier today to review recommendations from Dr. Lovena Le as the neck step would be AV nodal ablation with pacemaker placement and she is in agreement with seeing Dr. Lovena Le for further discussion. This visit is scheduled for 05/11/2020 at 1:45 PM.  I have also previously reviewed with Dr. Harl Bowie with plans for TEE for further evaluation of her MR once volume status improves.  Caryl Pina - Please let the patient know I reviewed labs from Oncology obtained earlier today and her kidney function has worsened yet again with creatinine going from 1.91 to 2.44. Her potassium is also elevated to 5.3 which is common to see with the change in kidney function.  Therefore, would recommend reducing Torsemide to 20 mg twice daily with instructions to take an extra tablet if needed for worsening edema or weight gain greater than 2 pounds overnight.  Would also recommend decreasing K-dur from 40 mEq to 20 mEq daily.  Would see if she can have a repeat BMET prior to her visit on 05/07/2020 for reassessment of both.   Signed, Erma Heritage, PA-C 04/27/2020, 4:21 PM Pager: [phone]

## 2020-04-27 NOTE — Telephone Encounter (Signed)
Patient verbalized understanding of Brittany's note. Patient verbalized agreement to medication changes and request for more lab work. Patient had no questions or concerns at this time.

## 2020-04-28 ENCOUNTER — Encounter (HOSPITAL_COMMUNITY): Payer: Self-pay | Admitting: Emergency Medicine

## 2020-04-28 ENCOUNTER — Other Ambulatory Visit: Payer: Self-pay

## 2020-04-28 ENCOUNTER — Emergency Department (HOSPITAL_COMMUNITY): Payer: Medicare Other

## 2020-04-28 ENCOUNTER — Inpatient Hospital Stay (HOSPITAL_COMMUNITY)
Admission: EM | Admit: 2020-04-28 | Discharge: 2020-05-09 | DRG: 682 | Disposition: E | Payer: Medicare Other | Attending: Family Medicine | Admitting: Family Medicine

## 2020-04-28 ENCOUNTER — Telehealth: Payer: Self-pay | Admitting: Student

## 2020-04-28 DIAGNOSIS — Z515 Encounter for palliative care: Secondary | ICD-10-CM

## 2020-04-28 DIAGNOSIS — D631 Anemia in chronic kidney disease: Secondary | ICD-10-CM | POA: Diagnosis not present

## 2020-04-28 DIAGNOSIS — E785 Hyperlipidemia, unspecified: Secondary | ICD-10-CM | POA: Diagnosis present

## 2020-04-28 DIAGNOSIS — E875 Hyperkalemia: Secondary | ICD-10-CM | POA: Diagnosis present

## 2020-04-28 DIAGNOSIS — Z7189 Other specified counseling: Secondary | ICD-10-CM | POA: Diagnosis not present

## 2020-04-28 DIAGNOSIS — Z888 Allergy status to other drugs, medicaments and biological substances status: Secondary | ICD-10-CM

## 2020-04-28 DIAGNOSIS — Z9119 Patient's noncompliance with other medical treatment and regimen: Secondary | ICD-10-CM

## 2020-04-28 DIAGNOSIS — G9341 Metabolic encephalopathy: Secondary | ICD-10-CM | POA: Diagnosis not present

## 2020-04-28 DIAGNOSIS — Z20822 Contact with and (suspected) exposure to covid-19: Secondary | ICD-10-CM | POA: Diagnosis present

## 2020-04-28 DIAGNOSIS — K7200 Acute and subacute hepatic failure without coma: Secondary | ICD-10-CM | POA: Diagnosis present

## 2020-04-28 DIAGNOSIS — I4819 Other persistent atrial fibrillation: Secondary | ICD-10-CM | POA: Diagnosis present

## 2020-04-28 DIAGNOSIS — Z9981 Dependence on supplemental oxygen: Secondary | ICD-10-CM

## 2020-04-28 DIAGNOSIS — R531 Weakness: Secondary | ICD-10-CM | POA: Diagnosis not present

## 2020-04-28 DIAGNOSIS — R4182 Altered mental status, unspecified: Secondary | ICD-10-CM | POA: Diagnosis not present

## 2020-04-28 DIAGNOSIS — K219 Gastro-esophageal reflux disease without esophagitis: Secondary | ICD-10-CM | POA: Diagnosis present

## 2020-04-28 DIAGNOSIS — J9621 Acute and chronic respiratory failure with hypoxia: Secondary | ICD-10-CM | POA: Diagnosis present

## 2020-04-28 DIAGNOSIS — R29818 Other symptoms and signs involving the nervous system: Secondary | ICD-10-CM | POA: Diagnosis not present

## 2020-04-28 DIAGNOSIS — I130 Hypertensive heart and chronic kidney disease with heart failure and stage 1 through stage 4 chronic kidney disease, or unspecified chronic kidney disease: Secondary | ICD-10-CM | POA: Diagnosis present

## 2020-04-28 DIAGNOSIS — I5043 Acute on chronic combined systolic (congestive) and diastolic (congestive) heart failure: Secondary | ICD-10-CM | POA: Diagnosis present

## 2020-04-28 DIAGNOSIS — Z90710 Acquired absence of both cervix and uterus: Secondary | ICD-10-CM

## 2020-04-28 DIAGNOSIS — D509 Iron deficiency anemia, unspecified: Secondary | ICD-10-CM | POA: Diagnosis present

## 2020-04-28 DIAGNOSIS — N189 Chronic kidney disease, unspecified: Secondary | ICD-10-CM | POA: Diagnosis not present

## 2020-04-28 DIAGNOSIS — E162 Hypoglycemia, unspecified: Secondary | ICD-10-CM | POA: Diagnosis not present

## 2020-04-28 DIAGNOSIS — Z79899 Other long term (current) drug therapy: Secondary | ICD-10-CM

## 2020-04-28 DIAGNOSIS — Z8249 Family history of ischemic heart disease and other diseases of the circulatory system: Secondary | ICD-10-CM

## 2020-04-28 DIAGNOSIS — E039 Hypothyroidism, unspecified: Secondary | ICD-10-CM | POA: Diagnosis present

## 2020-04-28 DIAGNOSIS — I452 Bifascicular block: Secondary | ICD-10-CM | POA: Diagnosis present

## 2020-04-28 DIAGNOSIS — R21 Rash and other nonspecific skin eruption: Secondary | ICD-10-CM | POA: Diagnosis not present

## 2020-04-28 DIAGNOSIS — N179 Acute kidney failure, unspecified: Principal | ICD-10-CM | POA: Diagnosis present

## 2020-04-28 DIAGNOSIS — J90 Pleural effusion, not elsewhere classified: Secondary | ICD-10-CM | POA: Diagnosis not present

## 2020-04-28 DIAGNOSIS — R0602 Shortness of breath: Secondary | ICD-10-CM | POA: Diagnosis not present

## 2020-04-28 DIAGNOSIS — I6782 Cerebral ischemia: Secondary | ICD-10-CM | POA: Diagnosis not present

## 2020-04-28 DIAGNOSIS — R001 Bradycardia, unspecified: Secondary | ICD-10-CM | POA: Diagnosis present

## 2020-04-28 DIAGNOSIS — E11649 Type 2 diabetes mellitus with hypoglycemia without coma: Secondary | ICD-10-CM | POA: Diagnosis not present

## 2020-04-28 DIAGNOSIS — Z66 Do not resuscitate: Secondary | ICD-10-CM | POA: Diagnosis present

## 2020-04-28 DIAGNOSIS — E872 Acidosis: Secondary | ICD-10-CM | POA: Diagnosis present

## 2020-04-28 DIAGNOSIS — I517 Cardiomegaly: Secondary | ICD-10-CM | POA: Diagnosis not present

## 2020-04-28 DIAGNOSIS — J9601 Acute respiratory failure with hypoxia: Secondary | ICD-10-CM | POA: Diagnosis present

## 2020-04-28 DIAGNOSIS — I5040 Unspecified combined systolic (congestive) and diastolic (congestive) heart failure: Secondary | ICD-10-CM | POA: Diagnosis not present

## 2020-04-28 DIAGNOSIS — M0609 Rheumatoid arthritis without rheumatoid factor, multiple sites: Secondary | ICD-10-CM | POA: Diagnosis present

## 2020-04-28 DIAGNOSIS — I340 Nonrheumatic mitral (valve) insufficiency: Secondary | ICD-10-CM | POA: Diagnosis not present

## 2020-04-28 DIAGNOSIS — I4891 Unspecified atrial fibrillation: Secondary | ICD-10-CM | POA: Diagnosis not present

## 2020-04-28 DIAGNOSIS — Z87891 Personal history of nicotine dependence: Secondary | ICD-10-CM

## 2020-04-28 DIAGNOSIS — N1832 Chronic kidney disease, stage 3b: Secondary | ICD-10-CM | POA: Diagnosis present

## 2020-04-28 DIAGNOSIS — Z79890 Hormone replacement therapy (postmenopausal): Secondary | ICD-10-CM

## 2020-04-28 DIAGNOSIS — N183 Chronic kidney disease, stage 3 unspecified: Secondary | ICD-10-CM | POA: Diagnosis not present

## 2020-04-28 DIAGNOSIS — D649 Anemia, unspecified: Secondary | ICD-10-CM | POA: Diagnosis not present

## 2020-04-28 DIAGNOSIS — Z9012 Acquired absence of left breast and nipple: Secondary | ICD-10-CM

## 2020-04-28 DIAGNOSIS — Z7901 Long term (current) use of anticoagulants: Secondary | ICD-10-CM

## 2020-04-28 DIAGNOSIS — I5042 Chronic combined systolic (congestive) and diastolic (congestive) heart failure: Secondary | ICD-10-CM | POA: Diagnosis present

## 2020-04-28 DIAGNOSIS — Z743 Need for continuous supervision: Secondary | ICD-10-CM | POA: Diagnosis not present

## 2020-04-28 DIAGNOSIS — Z9049 Acquired absence of other specified parts of digestive tract: Secondary | ICD-10-CM

## 2020-04-28 DIAGNOSIS — Z823 Family history of stroke: Secondary | ICD-10-CM

## 2020-04-28 DIAGNOSIS — G319 Degenerative disease of nervous system, unspecified: Secondary | ICD-10-CM | POA: Diagnosis not present

## 2020-04-28 LAB — URINALYSIS, ROUTINE W REFLEX MICROSCOPIC
Bilirubin Urine: NEGATIVE
Glucose, UA: NEGATIVE mg/dL
Hgb urine dipstick: NEGATIVE
Ketones, ur: NEGATIVE mg/dL
Leukocytes,Ua: NEGATIVE
Nitrite: NEGATIVE
Protein, ur: 100 mg/dL — AB
Specific Gravity, Urine: 1.015 (ref 1.005–1.030)
pH: 5 (ref 5.0–8.0)

## 2020-04-28 LAB — CBC WITH DIFFERENTIAL/PLATELET
Abs Immature Granulocytes: 0.05 10*3/uL (ref 0.00–0.07)
Basophils Absolute: 0 10*3/uL (ref 0.0–0.1)
Basophils Relative: 0 %
Eosinophils Absolute: 0 10*3/uL (ref 0.0–0.5)
Eosinophils Relative: 0 %
HCT: 34.3 % — ABNORMAL LOW (ref 36.0–46.0)
Hemoglobin: 9.2 g/dL — ABNORMAL LOW (ref 12.0–15.0)
Immature Granulocytes: 1 %
Lymphocytes Relative: 12 %
Lymphs Abs: 1 10*3/uL (ref 0.7–4.0)
MCH: 22.5 pg — ABNORMAL LOW (ref 26.0–34.0)
MCHC: 26.8 g/dL — ABNORMAL LOW (ref 30.0–36.0)
MCV: 84.1 fL (ref 80.0–100.0)
Monocytes Absolute: 1.2 10*3/uL — ABNORMAL HIGH (ref 0.1–1.0)
Monocytes Relative: 14 %
Neutro Abs: 6.5 10*3/uL (ref 1.7–7.7)
Neutrophils Relative %: 73 %
Platelets: 443 10*3/uL — ABNORMAL HIGH (ref 150–400)
RBC: 4.08 MIL/uL (ref 3.87–5.11)
RDW: 20.8 % — ABNORMAL HIGH (ref 11.5–15.5)
WBC: 8.7 10*3/uL (ref 4.0–10.5)
nRBC: 2.4 % — ABNORMAL HIGH (ref 0.0–0.2)

## 2020-04-28 LAB — COMPREHENSIVE METABOLIC PANEL
ALT: <5 U/L (ref 0–44)
AST: 69 U/L — ABNORMAL HIGH (ref 15–41)
Albumin: 3.6 g/dL (ref 3.5–5.0)
Alkaline Phosphatase: 186 U/L — ABNORMAL HIGH (ref 38–126)
Anion gap: 25 — ABNORMAL HIGH (ref 5–15)
BUN: 70 mg/dL — ABNORMAL HIGH (ref 8–23)
CO2: 11 mmol/L — ABNORMAL LOW (ref 22–32)
Calcium: 9.4 mg/dL (ref 8.9–10.3)
Chloride: 97 mmol/L — ABNORMAL LOW (ref 98–111)
Creatinine, Ser: 3.43 mg/dL — ABNORMAL HIGH (ref 0.44–1.00)
GFR, Estimated: 13 mL/min — ABNORMAL LOW (ref >60)
Glucose, Bld: <20 mg/dL — CL (ref 70–99)
Potassium: 6.9 mmol/L (ref 3.5–5.1)
Sodium: 133 mmol/L — ABNORMAL LOW (ref 135–145)
Total Bilirubin: 2.7 mg/dL — ABNORMAL HIGH (ref 0.3–1.2)
Total Protein: 6.9 g/dL (ref 6.5–8.1)

## 2020-04-28 LAB — BASIC METABOLIC PANEL
Anion gap: 24 — ABNORMAL HIGH (ref 5–15)
BUN: 68 mg/dL — ABNORMAL HIGH (ref 8–23)
CO2: 11 mmol/L — ABNORMAL LOW (ref 22–32)
Calcium: 8.4 mg/dL — ABNORMAL LOW (ref 8.9–10.3)
Chloride: 93 mmol/L — ABNORMAL LOW (ref 98–111)
Creatinine, Ser: 3.54 mg/dL — ABNORMAL HIGH (ref 0.44–1.00)
GFR, Estimated: 12 mL/min — ABNORMAL LOW (ref >60)
Glucose, Bld: 285 mg/dL — ABNORMAL HIGH (ref 70–99)
Potassium: 7 mmol/L (ref 3.5–5.1)
Sodium: 128 mmol/L — ABNORMAL LOW (ref 135–145)

## 2020-04-28 LAB — BRAIN NATRIURETIC PEPTIDE: B Natriuretic Peptide: 2512 pg/mL — ABNORMAL HIGH (ref 0.0–100.0)

## 2020-04-28 LAB — CBG MONITORING, ED
Glucose-Capillary: 270 mg/dL — ABNORMAL HIGH (ref 70–99)
Glucose-Capillary: <10 mg/dL — CL (ref 70–99)
Glucose-Capillary: 154 mg/dL — ABNORMAL HIGH (ref 70–99)

## 2020-04-28 LAB — BLOOD GAS, ARTERIAL
Acid-base deficit: 14.6 mmol/L — ABNORMAL HIGH (ref 0.0–2.0)
Bicarbonate: 13.1 mmol/L — ABNORMAL LOW (ref 20.0–28.0)
FIO2: 48
O2 Saturation: 97.6 %
Patient temperature: 36.4
pCO2 arterial: 27.5 mmHg — ABNORMAL LOW (ref 32.0–48.0)
pH, Arterial: 7.239 — ABNORMAL LOW (ref 7.350–7.450)
pO2, Arterial: 142 mmHg — ABNORMAL HIGH (ref 83.0–108.0)

## 2020-04-28 LAB — RESP PANEL BY RT-PCR (FLU A&B, COVID) ARPGX2
Influenza A by PCR: NEGATIVE
Influenza B by PCR: NEGATIVE
SARS Coronavirus 2 by RT PCR: NEGATIVE

## 2020-04-28 LAB — MAGNESIUM: Magnesium: 2.9 mg/dL — ABNORMAL HIGH (ref 1.7–2.4)

## 2020-04-28 MED ORDER — DEXTROSE 50 % IV SOLN: 1.0000 | Freq: Once | INTRAVENOUS | Status: DC

## 2020-04-28 MED ORDER — SODIUM ZIRCONIUM CYCLOSILICATE 5 G PO PACK
10.0000 g | PACK | ORAL | Status: AC
  Administered 2020-04-28: 10 g via ORAL
  Filled 2020-04-28: qty 2

## 2020-04-28 MED ORDER — SODIUM BICARBONATE 8.4 % IV SOLN
50.0000 meq | Freq: Once | INTRAVENOUS | Status: AC
  Administered 2020-04-28: 50 meq via INTRAVENOUS

## 2020-04-28 MED ORDER — INSULIN ASPART 100 UNIT/ML IV SOLN
10.0000 [IU] | Freq: Once | INTRAVENOUS | Status: AC
  Administered 2020-04-28: 10 [IU] via INTRAVENOUS

## 2020-04-28 MED ORDER — ALBUTEROL (5 MG/ML) CONTINUOUS INHALATION SOLN
10.0000 mg/h | INHALATION_SOLUTION | RESPIRATORY_TRACT | Status: DC
  Administered 2020-04-28: 10 mg/h via RESPIRATORY_TRACT
  Filled 2020-04-28: qty 20

## 2020-04-28 MED ORDER — DEXTROSE 50 % IV SOLN
1.0000 | Freq: Once | INTRAVENOUS | Status: AC
  Administered 2020-04-28: 50 mL via INTRAVENOUS
  Filled 2020-04-28: qty 50

## 2020-04-28 MED ORDER — CALCIUM GLUCONATE-NACL 1-0.675 GM/50ML-% IV SOLN: 1.0000 g | Freq: Once | INTRAVENOUS | Status: DC

## 2020-04-28 MED ORDER — CALCIUM GLUCONATE 10 % IV SOLN
1.0000 g | Freq: Once | INTRAVENOUS | Status: AC
  Administered 2020-04-28: 1 g via INTRAVENOUS
  Filled 2020-04-28: qty 10

## 2020-04-28 MED ORDER — DEXTROSE 50 % IV SOLN
INTRAVENOUS | Status: AC
  Administered 2020-04-28: 50 mL
  Filled 2020-04-28: qty 50

## 2020-04-28 MED ORDER — FUROSEMIDE 10 MG/ML IJ SOLN
40.0000 mg | INTRAMUSCULAR | Status: AC
  Administered 2020-04-28: 40 mg via INTRAVENOUS
  Filled 2020-04-28: qty 4

## 2020-04-28 MED ORDER — SODIUM BICARBONATE 8.4 % IV SOLN: 50.0000 meq | Freq: Once | INTRAVENOUS | Status: DC

## 2020-04-28 NOTE — ED Triage Notes (Signed)
Pt arrived by [facility]. Pt c/o of generalized weakness x2 days. Pt has bilateral leg swelling. Pt not eating well and has poor PO intake. 11lb weight gain since 04/09/20.

## 2020-04-28 NOTE — Telephone Encounter (Signed)
New Message    Husband called he thinks she may be dehydrated, she is not going to the bathroom very much, she is also very restless and not sleeping well

## 2020-04-28 NOTE — ED Notes (Addendum)
Date and time results received: 04/20/2020 2127  Test: potassium Critical Value: 7.0  Name of Provider Notified: Sabra Heck, MD  Orders Received? Or Actions Taken?: acknowledged

## 2020-04-28 NOTE — Telephone Encounter (Signed)
    I agree with using the supplemental oxygen, especially if her oxygen saturations were less than 92%. I am concerned about why her oxygen levels are down though as she did have pleural effusions during her hospital stay and required thoracentesis procedures to remove the fluid. Would she be able to come in for a 2-view CXR for reassessment at some point this week?  It is a delicate balance with fluid intake, keeping fluid off and her kidney function. She still needs to take in about ~ 1.5 L of fluid daily. If her weight has been stable she can hold Torsemide today but if her weight is increasing, would take as prescribed as her difficulty breathing at night could be secondary to fluid buildup. If she has recurrent symptoms tonight and dropping O2 saturations, she may need to come back to the Emergency Dept as her fluctuating kidney function complicates the picture.   Signed, Erma Heritage, PA-C 04/11/2020, 12:17 PM Pager: [phone]

## 2020-04-28 NOTE — Telephone Encounter (Signed)
Spouse verbalized that patient could come in Friday April, 22, 2022 for a CXR. Patient has gained 4 pounds of weight since 4/16, so continuing to take Torsemide as prescribed was recommended. Spouse verbalized understanding about fluid intake and agreement about bringing patient to the ED for evaluation for recurrent symptomology tonight.

## 2020-04-28 NOTE — Progress Notes (Signed)
Collected patient's ABG while patient was receiving CAT treatment.  Patient had been on 2L [place], but was on 7L during treatment.

## 2020-04-28 NOTE — Telephone Encounter (Signed)
Contacted patient with no answer and no voicemail.

## 2020-04-28 NOTE — ED Notes (Signed)
EDP at bedside for MSE.  

## 2020-04-28 NOTE — H&P (Addendum)
History and Physical    Erin Schmidt:[id number] DOB: 01-29-1932 DOA: 04/14/2020  PCP: Sharilyn Sites, MD   Patient coming from: Home  I have personally briefly reviewed patient's old medical records in [place]  Chief Complaint: Weakness, leg swelling  HPI: Erin Schmidt is a 87 y.o. female with medical history significant for rheumatoid arthritis, atrial fibrillation, systolic and diastolic CHF. Was brought to the ED via EMS with reports of generalized weakness of 2 days duration, bilateral leg swelling and poor oral intake over the past 24 hours. Patient was initially somnolent on arrival to the ED, at the time of my evaluation, she is lethargic, able to verbalize but not answering all questions.  History is obtained from chart review. Last night, patient was restless, because she was unable to breathe, with documented blood pressure of 82/52 at home, O2 sats of 88%, which improved when she used her home oxygen.  Spouse reported that patient had gained 4 pounds of weight since 4/16.  Also had been taking her torsemide as prescribed.   Patient tells me she did not eat yesterday because she was too exhausted.  She denies difficulty breathing.  She does not think her legs are more swollen than usual. Since her discharge from the hospital, patient has had persistent fatigue and weakness.  Recent hospitalization 3/25-4/1-for decompensated diastolic with new systolic CHF, diuresis was limited by worsening renal function, she was discharged on 20 mg of torsemide twice daily.  She required bilateral thoracentesis-with removal of total of 1.9 L of pleural fluid. She was discharged on home O2.  She also required transfusion of 1 unit of blood chronic disease. It was noted on discharge the patient continued to have lower extremity edema.  ED Course: Temp 97.5, blood pressure 106 - 115, heart rate 52. O2 sats  95%  Noted the patient was initially somnolent, but improved after Cr  Elevated 3.43.  Potassium elevated 6.9.  Blood glucose less than 20.  EKG showed atrial fibrillation with marked widening of QRS complex at 207.  Portable chest x-ray shows significant interval improvement in the bilateral lower lung fields ( residual infection or mild interstitial edema) compared to prior.  IV Lasix 40 mg was given.   Calcium carbonate, IV Lasix 40 mg, D50 and 10 units of insulin given, sodium bicarbonate and Lokelma.  Hospitalist to admit.  Review of Systems: As per HPI all other systems reviewed and negative.  Past Medical History:  Diagnosis Date  . Arthritis   . Atrial fibrillation ([place])   . Essential hypertension   . GERD (gastroesophageal reflux disease)   . Hyperlipemia   . Hypothyroidism   . Iron deficiency anemia 05/07/2019  . Migraine   . Mitral regurgitation   . Vertigo     Past Surgical History:  Procedure Laterality Date  . APPENDECTOMY    . BREAST BIOPSY Right    benign  . BREAST EXCISIONAL BIOPSY Left    Benign  . BREAST EXCISIONAL BIOPSY Left    Benign  . BREAST LUMPECTOMY  30 yrs ago   Dr. Milbert Coulter- rt breast  . BREAST LUMPECTOMY  15 yrs ago   left- Dr. Romona Curls  . BREAST LUMPECTOMY  01/2011  . CARDIOVERSION N/A 11/10/2019   Procedure: CARDIOVERSION;  Surgeon: Arnoldo Lenis, MD;  Location: [facility];  Service: Endoscopy;  Laterality: N/A;  . CARDIOVERSION N/A 02/19/2020   Procedure: CARDIOVERSION;  Surgeon: Arnoldo Lenis, MD;  Location: [facility];  Service: Endoscopy;  Laterality: N/A;  . CHOLECYSTECTOMY    . COLONOSCOPY    . COLONOSCOPY  04/13/2011   Procedure: COLONOSCOPY;  Surgeon: Rogene Houston, MD;  Location: [facility];  Service: Endoscopy;  Laterality: N/A;  930  . MASTECTOMY, PARTIAL  01/13/2011   Procedure: MASTECTOMY PARTIAL;  Surgeon: Adin Hector, MD;  Location: [facility];  Service: General;  Laterality: Left;  left partial mastectomy with needle locallization  . VAGINAL HYSTERECTOMY     2 partials      reports that she quit smoking about 31 years ago. Her smoking use included cigarettes. She has a 20.00 pack-year smoking history. She has never used smokeless tobacco. She reports that she does not drink alcohol and does not use drugs.  Allergies  Allergen Reactions  . Feraheme [Ferumoxytol]     Reacted during infusion requiring medical intervention. Increased BP    Family History  Problem Relation Age of Onset  . Heart disease Mother   . Stroke Sister   . Heart disease Brother   . Atrial fibrillation Sister   . Coronary artery disease Brother   . Lung cancer Son   . Healthy Son   . Colon cancer Neg Hx     Prior to Admission medications   Medication Sig Start Date End Date Taking? Authorizing Provider  Abatacept (ORENCIA CLICKJECT) 195 MG/ML SOAJ Inject 125 mg into the skin every 7 (seven) days. 03/08/20   Ofilia Neas, PA-C  apixaban (ELIQUIS) 2.5 MG TABS tablet Take 1 tablet (2.5 mg total) by mouth 2 (two) times daily. 04/09/20 05/09/20  Deatra James, MD  Calcium Carb-Cholecalciferol (CALCIUM 600 + D PO) Take 1 tablet by mouth 2 (two) times daily.    [provider]  cetirizine (ZYRTEC) 10 MG tablet Take 10 mg by mouth daily.    [provider]  cholecalciferol (VITAMIN D3) 25 MCG (1000 UNIT) tablet Take 1,000 Units by mouth daily.    [provider]  escitalopram (LEXAPRO) 20 MG tablet Take 20 mg by mouth daily.    [provider]  ketotifen (ZADITOR) 0.025 % ophthalmic solution Place 1 drop into both eyes 2 (two) times daily as needed (allergies).    [provider]  levothyroxine (SYNTHROID, LEVOTHROID) 112 MCG tablet Take 112 mcg by mouth daily before breakfast.  05/02/16   [provider]  Melatonin 10 MG CAPS Take 10 mg by mouth at bedtime.    [provider]  metoprolol succinate (TOPROL-XL) 50 MG 24 hr tablet Take 1 tablet (50 mg total) by mouth daily. Take with or immediately following a meal. 04/10/20 05/10/20   Deatra James, MD  omeprazole (PRILOSEC) 20 MG capsule Take 20 mg by mouth every Monday, Wednesday, and Friday.    [provider]  Potassium Chloride ER 20 MEQ TBCR Take 20 mEq by mouth in the morning and at bedtime. 04/27/20   Strader, Fransisco Hertz, PA-C  torsemide (DEMADEX) 20 MG tablet Take 1 tablet (20 mg total) by mouth 2 (two) times daily. 04/27/20 07/26/20  Erma Heritage, PA-C    Physical Exam: Vitals:   05/08/2020 1855 04/27/2020 1915 05/08/2020 2030  BP:  (!) 115/48 (!) 106/49  Pulse:  (!) 52   Resp:  (!) 26 (!) 21  SpO2:  95%   Weight: 70.3 kg    Height: 5\' 6"  (1.676 m)      Constitutional: Lethargic, calm, comfortable Vitals:   05/08/2020 1855 04/27/2020 1915 04/11/2020 2030  BP:  (!) 115/48 (!) 106/49  Pulse:  (!) 52   Resp:  (!) 26 (!) 21  SpO2:  95%   Weight: 70.3 kg    Height: 5\' 6"  (1.676 m)     Eyes: PERRL, lids and conjunctivae normal ENMT: Mucous membranes are moist.  Neck: normal, supple, no masses, no thyromegaly Respiratory: clear to auscultation bilaterally, no wheezing, no crackles. Normal respiratory effort. No accessory muscle use.  Cardiovascular: Regular rate and rhythm, no murmurs / rubs / gallops.  1+ pitting extremity edema worse in upper legs. 2+ pedal pulses.  Abdomen: no tenderness, no masses palpated. No hepatosplenomegaly. Bowel sounds positive.  Musculoskeletal: no clubbing / cyanosis. No joint deformity upper and lower extremities. Good ROM, no contractures. Normal muscle tone.  Skin: no rashes, lesions, ulcers. No induration Neurologic: No apparent cranial abnormality, moving extremities spontaneously. Psychiatric: Lethargic, but able to answer some questions, sometimes requiring prompting to respond.  Oriented to person and place.  Following directions.  Labs on Admission: I have personally reviewed following labs and imaging studies  CBC: Recent Labs  Lab 04/27/20 1112 05/03/2020 1903  WBC 7.2 8.7  NEUTROABS 4.4 6.5  HGB 8.3*  9.2*  HCT 28.8* 34.3*  MCV 80.4 84.1  PLT 346 341*   Basic Metabolic Panel: Recent Labs  Lab 04/27/20 1112 04/26/2020 1903  NA 133* 133*  K 5.3* 6.9*  CL 97* 97*  CO2 23 11*  GLUCOSE 90 <20*  BUN 58* 70*  CREATININE 2.44* 3.43*  CALCIUM 8.6* 9.4  MG  --  2.9*   Liver Function Tests: Recent Labs  Lab 04/27/20 1112 04/16/2020 1903  AST 33 69*  ALT 32 <5  ALKPHOS 162* 186*  BILITOT 1.4* 2.7*  PROT 6.3* 6.9  ALBUMIN 3.3* 3.6   CBG: Recent Labs  Lab 04/12/2020 2040 04/24/2020 2056  GLUCAP <10* 270*   Anemia Panel: Recent Labs    04/27/20 1112  FERRITIN 15  TIBC 521*  IRON 16*   Urine analysis:    Component Value Date/Time   COLORURINE YELLOW 04/09/2020 1036   APPEARANCEUR CLEAR 04/09/2020 1036   LABSPEC 1.014 04/09/2020 1036   PHURINE 5.0 04/09/2020 1036   GLUCOSEU NEGATIVE 04/09/2020 1036   HGBUR NEGATIVE 04/09/2020 1036   BILIRUBINUR NEGATIVE 04/09/2020 1036   KETONESUR NEGATIVE 04/09/2020 1036   PROTEINUR NEGATIVE 04/09/2020 1036   NITRITE NEGATIVE 04/09/2020 1036   LEUKOCYTESUR NEGATIVE 04/09/2020 1036    Radiological Exams on Admission: DG Chest Port 1 View  Result Date: 04/14/2020 CLINICAL DATA:  87 year old female with shortness of breath. EXAM: PORTABLE CHEST 1 VIEW COMPARISON:  Chest radiograph dated 04/06/2020. FINDINGS: Significant interval improvement in the bilateral lower lung field densities compared to prior radiograph. Bilateral lower lung field densities may represent residual infection or mild interstitial edema. Trace bilateral pleural effusions may be present. No pneumothorax. Stable cardiomegaly. Atherosclerotic calcification of the aorta. No acute osseous pathology. IMPRESSION: Significant interval improvement in the bilateral lower lung field densities compared to prior radiograph. Electronically Signed   By: Anner Crete M.D.   On: 04/17/2020 19:40    EKG: Independently reviewed.  Initial EKG showed atrial fibrillation rate 55, with  prolonged QTC of 516 and marked prolonged QRS at 207.  Repeat EKG status post hyperkalemia cocktail showed QRS now 175.  Assessment/Plan Principal Problem:   Acute metabolic encephalopathy Active Problems:   Hyperkalemia   Acute-on-chronic kidney injury ([place])   Rheumatoid arthritis of multiple sites without rheumatoid factor (HCC)   Acute  kidney injury superimposed on CKD (HCC)   Chronic combined systolic and diastolic CHF (congestive heart failure) (HCC)   Metabolic encephalopathy 2/2 hypoglycemia- blood glucose less than 20 >> 270 , initial somnolence now improving status post D50.  Hypoglycemia likely from poor oral intake. -Obtain ABG -Follow blood glucose closely every 2 hourly  Acute on chronic kidney injury 4- 5. creatinine 3.43,  gradual uptrend in Cr since discharge Cr 1.9.  Compliant with torsemide 20 mg twice daily.  No oral intake in at least 24 hours. - BMP in the morning - For now trial of gentle hydration with D 5 N/s 75cc/hr x 15hrs  Hyperkalemia potassium 6.9.  With corresponding EKG changes of widened QRS complex now improved status post hyperkalemia cocktail. -Monitor potassium closely -Hold home oral potassium supplements.  Chronic systolic and diastolic CHF-chronic bilateral pitting extremity edema present on discharge.  Per our chart her weight has remained stable since discharge.  Last echo 03/2020, EF 45 to 50% with indeterminate LV parameters.  Chest x-ray improved compared to prior.   - BNP markedly elevated at 2512, compared to 636 on recent hospitalization. ?  If secondary to worsening renal insufficiency. -Hold torsemide for now. -Volume status challenging to determine at this time especially with marked elevated BNP, trial of gentle IV fluids for now. -Cardiology consultation in the morning -At this point patient would benefit from palliative care evaluation.  Chronic respiratory failure on 2 L.  O2 sats 94 to 97% currently on 2 L.  Atrial fibrillation-  heart rate 42 - 52.  -Hold metoprolol for now -Resume Eliquis  Rheumatoid arthritis- on weekly abatacept injections.  Anemia of chronic disease, hemoglobin 9.2 stable.    DVT prophylaxis: Eliquis Code Status: DNI, Partial code.  As documented during recent hospitalization. Family Communication: None at bedside. Disposition Plan: ~ 2 days Consults called: Cardiology, palliative care. Admission status: Inpatient, stepdown I certify that at the point of admission it is my clinical judgment that the patient will require inpatient hospital care spanning beyond 2 midnights from the point of admission due to high intensity of service, high risk for further deterioration and high frequency of surveillance required.     Bethena Roys MD [facility]  05/02/2020, 10:44 PM

## 2020-04-28 NOTE — Telephone Encounter (Signed)
Contacted patient and spouse. Spouse stated that he had checked patients vitals last night and they are as follows: Bp: 82/52 P: 97 O2: 88 This morning: BP: 102/66 P: 55 O2; 96 (with Oxygen)  Patient was restless last night due to the fact she found it hard to breathe.  Patient was weak this morning and could not move from her chair and spouse was unable to help her get up therefore they called a neighbor who is an EMT to help. Neighbor came over and helped patient to the bathroom, which she hadn't gone to since 4 am, and then started her oxygen for her at 2.5L. Patient was against this at first, but after reassurance from EMT, agreed. Patient stated that she felt better since using her oxygen.  Patient feels dehydrated, but both spouse and patient admitted that patient had not had much fluid in the past 24 hours. Advised both patient and spouse that powerade or children's pedialyte would help with replenishing electrolytes lost from dehydration. I also informed patient that it would probably be the best course of action to contact her PCP's office to let them handle her dehydration/urination issue. Patient voiced understanding. Patient has an upcoming appointment with Urology as well.  Patient did not take Torsemide or K-Dur yesterday. Patient was advised of the changes made to her medication by Bernerd Pho, PA=C yesterday and again today. Patient wrote them on her medication list and voiced understanding. Patient's medication list was updated in [place] on 04/27/2020.
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
## 2020-04-29 ENCOUNTER — Encounter (HOSPITAL_COMMUNITY): Payer: Self-pay | Admitting: Internal Medicine

## 2020-04-29 ENCOUNTER — Other Ambulatory Visit: Payer: Self-pay | Admitting: *Deleted

## 2020-04-29 ENCOUNTER — Inpatient Hospital Stay (HOSPITAL_COMMUNITY): Payer: Medicare Other

## 2020-04-29 DIAGNOSIS — N179 Acute kidney failure, unspecified: Principal | ICD-10-CM

## 2020-04-29 DIAGNOSIS — D649 Anemia, unspecified: Secondary | ICD-10-CM

## 2020-04-29 DIAGNOSIS — I4819 Other persistent atrial fibrillation: Secondary | ICD-10-CM

## 2020-04-29 DIAGNOSIS — K7200 Acute and subacute hepatic failure without coma: Secondary | ICD-10-CM | POA: Diagnosis present

## 2020-04-29 DIAGNOSIS — Z7189 Other specified counseling: Secondary | ICD-10-CM | POA: Diagnosis not present

## 2020-04-29 DIAGNOSIS — Z515 Encounter for palliative care: Secondary | ICD-10-CM

## 2020-04-29 DIAGNOSIS — G9341 Metabolic encephalopathy: Secondary | ICD-10-CM | POA: Diagnosis not present

## 2020-04-29 DIAGNOSIS — N189 Chronic kidney disease, unspecified: Secondary | ICD-10-CM

## 2020-04-29 DIAGNOSIS — I340 Nonrheumatic mitral (valve) insufficiency: Secondary | ICD-10-CM

## 2020-04-29 DIAGNOSIS — N183 Chronic kidney disease, stage 3 unspecified: Secondary | ICD-10-CM

## 2020-04-29 DIAGNOSIS — R4182 Altered mental status, unspecified: Secondary | ICD-10-CM

## 2020-04-29 DIAGNOSIS — I5043 Acute on chronic combined systolic (congestive) and diastolic (congestive) heart failure: Secondary | ICD-10-CM

## 2020-04-29 LAB — COMPREHENSIVE METABOLIC PANEL
ALT: 418 U/L — ABNORMAL HIGH (ref 0–44)
AST: 1074 U/L — ABNORMAL HIGH (ref 15–41)
Albumin: 3.2 g/dL — ABNORMAL LOW (ref 3.5–5.0)
Alkaline Phosphatase: 179 U/L — ABNORMAL HIGH (ref 38–126)
Anion gap: 25 — ABNORMAL HIGH (ref 5–15)
BUN: 75 mg/dL — ABNORMAL HIGH (ref 8–23)
CO2: 9 mmol/L — ABNORMAL LOW (ref 22–32)
Calcium: 9.1 mg/dL (ref 8.9–10.3)
Chloride: 100 mmol/L (ref 98–111)
Creatinine, Ser: 3.92 mg/dL — ABNORMAL HIGH (ref 0.44–1.00)
GFR, Estimated: 11 mL/min — ABNORMAL LOW (ref >60)
Glucose, Bld: 151 mg/dL — ABNORMAL HIGH (ref 70–99)
Potassium: 6.8 mmol/L (ref 3.5–5.1)
Sodium: 134 mmol/L — ABNORMAL LOW (ref 135–145)
Total Bilirubin: 2.2 mg/dL — ABNORMAL HIGH (ref 0.3–1.2)
Total Protein: 6.2 g/dL — ABNORMAL LOW (ref 6.5–8.1)

## 2020-04-29 LAB — BASIC METABOLIC PANEL
Anion gap: 26 — ABNORMAL HIGH (ref 5–15)
Anion gap: 26 — ABNORMAL HIGH (ref 5–15)
BUN: 73 mg/dL — ABNORMAL HIGH (ref 8–23)
BUN: 73 mg/dL — ABNORMAL HIGH (ref 8–23)
CO2: 12 mmol/L — ABNORMAL LOW (ref 22–32)
CO2: 8 mmol/L — ABNORMAL LOW (ref 22–32)
Calcium: 9 mg/dL (ref 8.9–10.3)
Calcium: 9.5 mg/dL (ref 8.9–10.3)
Chloride: 98 mmol/L (ref 98–111)
Chloride: 99 mmol/L (ref 98–111)
Creatinine, Ser: 3.58 mg/dL — ABNORMAL HIGH (ref 0.44–1.00)
Creatinine, Ser: 3.58 mg/dL — ABNORMAL HIGH (ref 0.44–1.00)
GFR, Estimated: 12 mL/min — ABNORMAL LOW (ref >60)
GFR, Estimated: 12 mL/min — ABNORMAL LOW (ref >60)
Glucose, Bld: 147 mg/dL — ABNORMAL HIGH (ref 70–99)
Glucose, Bld: 88 mg/dL (ref 70–99)
Potassium: 6.1 mmol/L — ABNORMAL HIGH (ref 3.5–5.1)
Potassium: 7.6 mmol/L (ref 3.5–5.1)
Sodium: 132 mmol/L — ABNORMAL LOW (ref 135–145)
Sodium: 137 mmol/L (ref 135–145)

## 2020-04-29 LAB — BLOOD GAS, ARTERIAL
Acid-base deficit: 18 mmol/L — ABNORMAL HIGH (ref 0.0–2.0)
Bicarbonate: 10.2 mmol/L — ABNORMAL LOW (ref 20.0–28.0)
FIO2: 100
O2 Saturation: 96 %
Patient temperature: 37
pCO2 arterial: 51.8 mmHg — ABNORMAL HIGH (ref 32.0–48.0)
pH, Arterial: 6.976 — CL (ref 7.350–7.450)
pO2, Arterial: 146 mmHg — ABNORMAL HIGH (ref 83.0–108.0)

## 2020-04-29 LAB — CBC
HCT: 32.2 % — ABNORMAL LOW (ref 36.0–46.0)
HCT: 33.1 % — ABNORMAL LOW (ref 36.0–46.0)
Hemoglobin: 8.3 g/dL — ABNORMAL LOW (ref 12.0–15.0)
Hemoglobin: 8.5 g/dL — ABNORMAL LOW (ref 12.0–15.0)
MCH: 22.7 pg — ABNORMAL LOW (ref 26.0–34.0)
MCH: 23.1 pg — ABNORMAL LOW (ref 26.0–34.0)
MCHC: 25.8 g/dL — ABNORMAL LOW (ref 30.0–36.0)
MCHC: 25.7 g/dL — ABNORMAL LOW (ref 30.0–36.0)
MCV: 88.2 fL (ref 80.0–100.0)
MCV: 89.9 fL (ref 80.0–100.0)
Platelets: 403 10*3/uL — ABNORMAL HIGH (ref 150–400)
Platelets: 368 10*3/uL (ref 150–400)
RBC: 3.65 MIL/uL — ABNORMAL LOW (ref 3.87–5.11)
RBC: 3.68 MIL/uL — ABNORMAL LOW (ref 3.87–5.11)
RDW: 20.4 % — ABNORMAL HIGH (ref 11.5–15.5)
RDW: 20.5 % — ABNORMAL HIGH (ref 11.5–15.5)
WBC: 19.4 10*3/uL — ABNORMAL HIGH (ref 4.0–10.5)
WBC: 21 10*3/uL — ABNORMAL HIGH (ref 4.0–10.5)
nRBC: 1.9 % — ABNORMAL HIGH (ref 0.0–0.2)
nRBC: 2.5 % — ABNORMAL HIGH (ref 0.0–0.2)

## 2020-04-29 LAB — GLUCOSE, CAPILLARY
Glucose-Capillary: 106 mg/dL — ABNORMAL HIGH (ref 70–99)
Glucose-Capillary: 127 mg/dL — ABNORMAL HIGH (ref 70–99)
Glucose-Capillary: 143 mg/dL — ABNORMAL HIGH (ref 70–99)
Glucose-Capillary: 220 mg/dL — ABNORMAL HIGH (ref 70–99)
Glucose-Capillary: 245 mg/dL — ABNORMAL HIGH (ref 70–99)
Glucose-Capillary: 61 mg/dL — ABNORMAL LOW (ref 70–99)
Glucose-Capillary: 64 mg/dL — ABNORMAL LOW (ref 70–99)
Glucose-Capillary: 96 mg/dL (ref 70–99)

## 2020-04-29 LAB — HEPATIC FUNCTION PANEL
ALT: 285 U/L — ABNORMAL HIGH (ref 0–44)
AST: 685 U/L — ABNORMAL HIGH (ref 15–41)
Albumin: 3.4 g/dL — ABNORMAL LOW (ref 3.5–5.0)
Alkaline Phosphatase: 187 U/L — ABNORMAL HIGH (ref 38–126)
Bilirubin, Direct: 1.2 mg/dL — ABNORMAL HIGH (ref 0.0–0.2)
Indirect Bilirubin: 1.1 mg/dL — ABNORMAL HIGH (ref 0.3–0.9)
Total Bilirubin: 2.3 mg/dL — ABNORMAL HIGH (ref 0.3–1.2)
Total Protein: 6.6 g/dL (ref 6.5–8.1)

## 2020-04-29 LAB — MRSA PCR SCREENING: MRSA by PCR: NEGATIVE

## 2020-04-29 LAB — TROPONIN I (HIGH SENSITIVITY): Troponin I (High Sensitivity): 76 ng/L — ABNORMAL HIGH (ref <18)

## 2020-04-29 LAB — PROCALCITONIN: Procalcitonin: 0.35 ng/mL

## 2020-04-29 LAB — APTT: aPTT: 46 seconds — ABNORMAL HIGH (ref 24–36)

## 2020-04-29 LAB — LACTIC ACID, PLASMA: Lactic Acid, Venous: >11 mmol/L (ref 0.5–1.9)

## 2020-04-29 MED ORDER — DEXTROSE 50 % IV SOLN
INTRAVENOUS | Status: AC
  Administered 2020-04-29: 50 mL via INTRAVENOUS
  Filled 2020-04-29: qty 50

## 2020-04-29 MED ORDER — HALOPERIDOL LACTATE 2 MG/ML PO CONC: 0.5000 mg | ORAL | Status: DC | PRN

## 2020-04-29 MED ORDER — BIOTENE DRY MOUTH MT LIQD: 15.0000 mL | OROMUCOSAL | Status: DC | PRN

## 2020-04-29 MED ORDER — SODIUM BICARBONATE 8.4 % IV SOLN
INTRAVENOUS | Status: AC
  Administered 2020-04-29: 50 meq via INTRAVENOUS
  Filled 2020-04-29: qty 50

## 2020-04-29 MED ORDER — POLYVINYL ALCOHOL 1.4 % OP SOLN: 1.0000 [drp] | Freq: Four times a day (QID) | OPHTHALMIC | Status: DC | PRN

## 2020-04-29 MED ORDER — ALBUTEROL SULFATE (2.5 MG/3ML) 0.083% IN NEBU: 2.5000 mg | INHALATION_SOLUTION | RESPIRATORY_TRACT | Status: DC | PRN

## 2020-04-29 MED ORDER — CHLORHEXIDINE GLUCONATE CLOTH 2 % EX PADS: 6.0000 | MEDICATED_PAD | Freq: Every day | CUTANEOUS | Status: DC

## 2020-04-29 MED ORDER — HALOPERIDOL 0.5 MG PO TABS: 0.5000 mg | ORAL_TABLET | ORAL | Status: DC | PRN

## 2020-04-29 MED ORDER — DEXTROSE-NACL 5-0.9 % IV SOLN: INTRAVENOUS | Status: DC

## 2020-04-29 MED ORDER — GLYCOPYRROLATE 1 MG PO TABS: 1.0000 mg | ORAL_TABLET | ORAL | Status: DC | PRN

## 2020-04-29 MED ORDER — SODIUM POLYSTYRENE SULFONATE 15 GM/60ML PO SUSP: 60.0000 g | Freq: Once | ORAL | Status: DC

## 2020-04-29 MED ORDER — DEXTROSE 50 % IV SOLN
INTRAVENOUS | Status: AC
  Administered 2020-04-29: 50 mL
  Filled 2020-04-29: qty 50

## 2020-04-29 MED ORDER — GLYCOPYRROLATE 0.2 MG/ML IJ SOLN
0.2000 mg | INTRAMUSCULAR | Status: DC | PRN
  Administered 2020-04-29: 0.2 mg via INTRAVENOUS

## 2020-04-29 MED ORDER — LORAZEPAM 2 MG/ML IJ SOLN
1.0000 mg | INTRAMUSCULAR | Status: DC | PRN
  Administered 2020-04-29: 1 mg via INTRAVENOUS
  Filled 2020-04-29: qty 1

## 2020-04-29 MED ORDER — SODIUM BICARBONATE 8.4 % IV SOLN: 50.0000 meq | Freq: Once | INTRAVENOUS | Status: AC

## 2020-04-29 MED ORDER — SODIUM BICARBONATE 8.4 % IV SOLN
INTRAVENOUS | Status: DC
  Filled 2020-04-29 (×3): qty 1000

## 2020-04-29 MED ORDER — APIXABAN 2.5 MG PO TABS: 2.5000 mg | ORAL_TABLET | Freq: Two times a day (BID) | ORAL | Status: DC

## 2020-04-29 MED ORDER — ACETAMINOPHEN 325 MG PO TABS: 650.0000 mg | ORAL_TABLET | Freq: Four times a day (QID) | ORAL | Status: DC | PRN

## 2020-04-29 MED ORDER — SODIUM BICARBONATE 8.4 % IV SOLN: INTRAVENOUS | Status: DC

## 2020-04-29 MED ORDER — SODIUM CHLORIDE 0.9 % IV SOLN: 2.0000 g | INTRAVENOUS | Status: DC

## 2020-04-29 MED ORDER — GLYCOPYRROLATE 0.2 MG/ML IJ SOLN: 0.2000 mg | INTRAMUSCULAR | Status: DC | PRN

## 2020-04-29 MED ORDER — SODIUM ZIRCONIUM CYCLOSILICATE 10 G PO PACK: 10.0000 g | PACK | Freq: Once | ORAL | Status: DC

## 2020-04-29 MED ORDER — LORAZEPAM 1 MG PO TABS: 1.0000 mg | ORAL_TABLET | ORAL | Status: DC | PRN

## 2020-04-29 MED ORDER — ONDANSETRON HCL 4 MG/2ML IJ SOLN: 4.0000 mg | Freq: Four times a day (QID) | INTRAMUSCULAR | Status: DC | PRN

## 2020-04-29 MED ORDER — DEXTROSE 50 % IV SOLN
1.0000 | Freq: Once | INTRAVENOUS | Status: AC
  Filled 2020-04-29: qty 50

## 2020-04-29 MED ORDER — ACETAMINOPHEN 650 MG RE SUPP
650.0000 mg | Freq: Four times a day (QID) | RECTAL | Status: DC | PRN
Start: 2020-04-28 — End: 2020-04-29

## 2020-04-29 MED ORDER — POLYETHYLENE GLYCOL 3350 17 G PO PACK: 17.0000 g | PACK | Freq: Every day | ORAL | Status: DC | PRN

## 2020-04-29 MED ORDER — CALCIUM CHLORIDE 10 % IV SOLN
INTRAVENOUS | Status: AC
  Filled 2020-04-29: qty 10

## 2020-04-29 MED ORDER — ONDANSETRON 4 MG PO TBDP: 4.0000 mg | ORAL_TABLET | Freq: Four times a day (QID) | ORAL | Status: DC | PRN

## 2020-04-29 MED ORDER — FENTANYL CITRATE (PF) 100 MCG/2ML IJ SOLN
25.0000 ug | INTRAMUSCULAR | Status: DC | PRN
  Administered 2020-04-29: 25 ug via INTRAVENOUS
  Filled 2020-04-29: qty 2

## 2020-04-29 MED ORDER — LORAZEPAM 2 MG/ML PO CONC: 1.0000 mg | ORAL | Status: DC | PRN

## 2020-04-29 MED ORDER — HALOPERIDOL LACTATE 5 MG/ML IJ SOLN: 0.5000 mg | INTRAMUSCULAR | Status: DC | PRN

## 2020-04-30 LAB — URINE CULTURE: Culture: NO GROWTH

## 2020-05-03 ENCOUNTER — Ambulatory Visit (HOSPITAL_COMMUNITY): Payer: Medicare Other | Admitting: Physical Therapy

## 2020-05-04 ENCOUNTER — Ambulatory Visit (HOSPITAL_COMMUNITY): Payer: Medicare Other | Admitting: Hematology

## 2020-05-04 LAB — CULTURE, BLOOD (ROUTINE X 2)
Culture: NO GROWTH
Culture: NO GROWTH

## 2020-05-07 ENCOUNTER — Ambulatory Visit: Payer: Medicare Other | Admitting: Student

## 2020-05-07 ENCOUNTER — Telehealth: Payer: Self-pay | Admitting: Rheumatology

## 2020-05-09 NOTE — Progress Notes (Signed)
Notified Physician for critical lab (Potassium)  Additional Lab draw scheduled.

## 2020-05-09 NOTE — Consult Note (Signed)
Consultation Note Date: 05-27-2020   Patient Name: Erin Schmidt  DOB: 06/03/1932  MRN: [id number]  Age / Sex: 87 y.o., female  PCP: Sharilyn Sites, MD Referring Physician: Roxan Hockey, MD  Reason for Consultation: Establishing goals of care and Terminal Care  HPI/Patient Profile: 87 y.o. female  with past medical history of  RA, afib, systolic/diastolic CHF, HTN/HLD, GERD admitted on 04/15/2020 with code stroke .   Clinical Assessment and Goals of Care: Erin Schmidt is lying quietly in bed.  She appears acutely/chronically ill and frail.  She is surrounded by staff due to a sudden decline.  She is not responding in any meaningful way.  After further testing, it is noted that she has suffered multi-organ failure, fulminant liver failure, renal failure with severe acidosis and persistent hyperkalemia, suspect sepsis.   She has experienced an acute and sudden decline.  Family has elected FULL COMFORT care.    End-of-life order set implemented.  Conference with attending, bedside nursing staff and TOC related to plan of care, FULL COMFORT care.    HCPOA   NEXT OF KIN - husband Terence Googe    SUMMARY OF RECOMMENDATIONS   FULL COMFORT care Anticipate hours to days, in hospital death  Code Status/Advance Care Planning:  DNR  Symptom Management:   End of life order set implemented   Palliative Prophylaxis:   Frequent Pain Assessment and Oral Care  Additional Recommendations (Limitations, Scope, Preferences):  Full Comfort Care  Psycho-social/Spiritual:   Desire for further Chaplaincy support:no  Additional Recommendations: Education on Hospice and Grief/Bereavement Support  Prognosis:   Hours - Days  Discharge Planning: Anticipated Hospital Death      Primary Diagnoses: Present on Admission: . Acute kidney injury superimposed on CKD ([place]) . Acute metabolic encephalopathy .  Hyperkalemia . Rheumatoid arthritis of multiple sites without rheumatoid factor ([place]) . Acute-on-chronic kidney injury ([place]) . Chronic combined systolic and diastolic CHF (congestive heart failure) ([place]) . Hypoglycemia   I have reviewed the medical record, interviewed the patient and family, and examined the patient. The following aspects are pertinent.  Past Medical History:  Diagnosis Date  . Arthritis   . Atrial fibrillation ([place])   . Essential hypertension   . GERD (gastroesophageal reflux disease)   . Hyperlipemia   . Hypothyroidism   . Iron deficiency anemia 05/07/2019  . Migraine   . Mitral regurgitation   . Vertigo    Social History   Socioeconomic History  . Marital status: Married    Spouse name: Not on file  . Number of children: 2  . Years of education: Not on file  . Highest education level: Not on file  Occupational History  . Occupation: RETIRED  Tobacco Use  . Smoking status: Former Smoker    Packs/day: 1.00    Years: 20.00    Pack years: 20.00    Types: Cigarettes    Quit date: 01/05/1989    Years since quitting: 31.3  . Smokeless tobacco: Never Used  Vaping Use  . Vaping Use:  Never used  Substance and Sexual Activity  . Alcohol use: No  . Drug use: Never  . Sexual activity: Not on file  Other Topics Concern  . Not on file  Social History Narrative  . Not on file   Social Determinants of Health   Financial Resource Strain: Not on file  Food Insecurity: No Food Insecurity  . Worried About Charity fundraiser in the Last Year: Never true  . Ran Out of Food in the Last Year: Never true  Transportation Needs: No Transportation Needs  . Lack of Transportation (Medical): No  . Lack of Transportation (Non-Medical): No  Physical Activity: Not on file  Stress: Not on file  Social Connections: Not on file   Family History  Problem Relation Age of Onset  . Heart disease Mother   . Stroke Sister   . Heart disease Brother   . Atrial  fibrillation Sister   . Coronary artery disease Brother   . Lung cancer Son   . Healthy Son   . Colon cancer Neg Hx    Scheduled Meds: . Chlorhexidine Gluconate Cloth  6 each Topical Daily  . sodium zirconium cyclosilicate  10 g Oral Once   Continuous Infusions: . sodium bicarbonate 150 mEq in D5W infusion 125 mL/hr at 05/26/2020 1248   PRN Meds:.acetaminophen **OR** acetaminophen, albuterol, antiseptic oral rinse, fentaNYL (SUBLIMAZE) injection, glycopyrrolate **OR** glycopyrrolate **OR** glycopyrrolate, LORazepam **OR** [DISCONTINUED] LORazepam **OR** LORazepam, ondansetron **OR** ondansetron (ZOFRAN) IV, polyvinyl alcohol Medications Prior to Admission:  Prior to Admission medications   Medication Sig Start Date End Date Taking? Authorizing Provider  apixaban (ELIQUIS) 2.5 MG TABS tablet Take 1 tablet (2.5 mg total) by mouth 2 (two) times daily. 04/09/20 05/09/20 Yes Shahmehdi, Valeria Batman, MD  Calcium Carb-Cholecalciferol (CALCIUM 600 + D PO) Take 1 tablet by mouth 2 (two) times daily.   Yes [provider]  cetirizine (ZYRTEC) 10 MG tablet Take 10 mg by mouth daily.   Yes [provider]  cholecalciferol (VITAMIN D3) 25 MCG (1000 UNIT) tablet Take 1,000 Units by mouth daily.   Yes [provider]  escitalopram (LEXAPRO) 10 MG tablet Take 1 tablet by mouth daily. 04/27/20  Yes [provider]  ketotifen (ZADITOR) 0.025 % ophthalmic solution Place 1 drop into both eyes 2 (two) times daily as needed (allergies).   Yes [provider]  levothyroxine (SYNTHROID, LEVOTHROID) 112 MCG tablet Take 112 mcg by mouth daily before breakfast.  05/02/16  Yes [provider]  Melatonin 10 MG CAPS Take 10 mg by mouth at bedtime.   Yes [provider]  metoprolol succinate (TOPROL-XL) 50 MG 24 hr tablet Take 1 tablet (50 mg total) by mouth daily. Take with or immediately following a meal. 04/10/20 05/10/20 Yes Shahmehdi, Valeria Batman, MD  omeprazole (PRILOSEC)  20 MG capsule Take 20 mg by mouth every Monday, Wednesday, and Friday.   Yes [provider]  Potassium Chloride ER 20 MEQ TBCR Take 20 mEq by mouth in the morning and at bedtime. 04/27/20  Yes Strader, Fransisco Hertz, PA-C  torsemide (DEMADEX) 20 MG tablet Take 1 tablet (20 mg total) by mouth 2 (two) times daily. 04/27/20 07/26/20 Yes Strader, Fransisco Hertz, PA-C  Abatacept (ORENCIA CLICKJECT) 606 MG/ML SOAJ Inject 125 mg into the skin every 7 (seven) days. 03/08/20   Ofilia Neas, PA-C   Allergies  Allergen Reactions  . Feraheme [Ferumoxytol]     Reacted during infusion requiring medical intervention. Increased BP  Review of Systems  Unable to perform ROS: Mental status change    Physical Exam Vitals and nursing note reviewed.  Constitutional:      General: She is not in acute distress.    Appearance: She is ill-appearing.  HENT:     Mouth/Throat:     Mouth: Mucous membranes are dry.  Cardiovascular:     Rate and Rhythm: Normal rate.  Pulmonary:     Effort: Pulmonary effort is normal. No respiratory distress.  Skin:    General: Skin is warm and dry.  Psychiatric:        Mood and Affect: Mood normal.        Behavior: Behavior normal.     Vital Signs: BP (!) 90/33   Pulse (!) 108   Temp (!) 96.3 F (35.7 C) (Axillary)   Resp 18   Ht 5\' 6"  (1.676 m)   Wt 76.1 kg   SpO2 97%   BMI 27.08 kg/m  Pain Scale: Faces   Pain Score: Asleep   SpO2: SpO2: 97 % O2 Device:SpO2: 97 % O2 Flow Rate: .O2 Flow Rate (L/min): 3 L/min  IO: Intake/output summary:   Intake/Output Summary (Last 24 hours) at 05-21-20 1304 Last data filed at 05-21-20 0318 Gross per 24 hour  Intake 223.75 ml  Output --  Net 223.75 ml    LBM:   Baseline Weight: Weight: 70.3 kg Most recent weight: Weight: 76.1 kg     Palliative Assessment/Data:   Flowsheet Rows   Flowsheet Row Most Recent Value  Intake Tab   Referral Department Hospitalist  Unit at Time of Referral Intermediate Care Unit   Palliative Care Primary Diagnosis Cardiac  Date Notified 04/27/2020  Palliative Care Type New Palliative care  Reason for referral Clarify Goals of Care  Date of Admission 04/17/2020  Date first seen by Palliative Care 05-21-2020  # of days Palliative referral response time 1 Day(s)  # of days IP prior to Palliative referral 0  Clinical Assessment   Palliative Performance Scale Score 10%  Pain Max last 24 hours Not able to report  Pain Min Last 24 hours Not able to report  Dyspnea Max Last 24 Hours Not able to report  Dyspnea Min Last 24 hours Not able to report  Psychosocial & Spiritual Assessment   Palliative Care Outcomes       Time In: 0940 Time Out: 1030 Time Total: 50 minutes  Greater than 50%  of this time was spent counseling and coordinating care related to the above assessment and plan.  Signed by: Drue Novel, NP   Please contact Palliative Medicine Team phone at [phone] for questions and concerns.  For individual provider: See Shea Evans

## 2020-05-09 NOTE — Consult Note (Signed)
[facility] Renal Consultation Note  Requesting MD: Roxan Hockey, MD Indication for Consultation:  AKI and hyperkalemia  Chief complaint: weakness and leg swelling  HPI: Erin Schmidt is a 87 y.o. female with a history of HTN, systolic and diastolic CHF, afib and RA who presented to the ER via EMS after two days of weakness and leg swelling.  She is unable to provide history and history is obtained via chart review.  Charted as being lethargic and not answering all questions per ED charting.  She was recently hospitalized with decompensated diastolic and new systolic CHF with diuresis limited by renal dysfunction per charting.  She was discharged on home oxygen and torsemide 20 mg BID. She has had worsening renal function recently.  She was creatinine 2.44 on 4/19 and later 3.43 on 04/24/2020 on presentation to ER and this has risen to 3.58 at the time of consult.  She was hyperkalemic on admission and this was medically managed per charting and her potassium supplements were held.  She had been discharged on potassium 20 meq BID.  Note an updated CMP is pending - last K 6.1 and max of 7.6 yesterday.  I was consulted recently this morning and shortly thereafter a Code Stroke was called.  Team at bedside on my exam and they are preparing her to go down to CT emergently.  Note that she is listed as a partial code - do not  Intubate.  For her tach earlier this morning she had hypoglycemia.  Earlier this morning she had asked for oatmeal.  Per her tech, she was "talking 1 minute and then became unresponsive" and a code stroke was called.  Note that palliative care has also been consulted.     Creatinine, Ser  Date/Time Value Ref Range Status  2020-05-25 05:34 AM 3.58 (H) 0.44 - 1.00 mg/dL Final  04/14/2020 11:43 PM 3.58 (H) 0.44 - 1.00 mg/dL Final  05/02/2020 08:57 PM 3.54 (H) 0.44 - 1.00 mg/dL Final  04/23/2020 07:03 PM 3.43 (H) 0.44 - 1.00 mg/dL Final  04/27/2020 11:12 AM 2.44  (H) 0.44 - 1.00 mg/dL Final  04/09/2020 04:47 AM 1.91 (H) 0.44 - 1.00 mg/dL Final  04/08/2020 05:49 AM 2.91 (H) 0.44 - 1.00 mg/dL Final  04/07/2020 05:00 AM 2.77 (H) 0.44 - 1.00 mg/dL Final  04/06/2020 05:03 AM 1.89 (H) 0.44 - 1.00 mg/dL Final  04/05/2020 04:38 AM 1.45 (H) 0.44 - 1.00 mg/dL Final  04/04/2020 05:41 AM 1.36 (H) 0.44 - 1.00 mg/dL Final  04/03/2020 04:01 AM 1.60 (H) 0.44 - 1.00 mg/dL Final  04/03/2020 04:01 AM 1.61 (H) 0.44 - 1.00 mg/dL Final  04/02/2020 01:48 AM 2.37 (H) 0.44 - 1.00 mg/dL Final  02/27/2020 02:51 PM 1.25 (H) 0.44 - 1.00 mg/dL Final  02/18/2020 08:41 AM 1.75 (H) 0.44 - 1.00 mg/dL Final  02/11/2020 02:58 PM 1.42 (H) 0.44 - 1.00 mg/dL Final  02/02/2020 10:57 AM 2.01 (H) 0.44 - 1.00 mg/dL Final  01/28/2020 12:56 PM 2.30 (H) 0.44 - 1.00 mg/dL Final  12/22/2019 11:29 AM 1.60 (H) 0.44 - 1.00 mg/dL Final  12/15/2019 11:07 AM 1.21 (H) 0.44 - 1.00 mg/dL Final  12/03/2019 06:45 AM 1.04 (H) 0.44 - 1.00 mg/dL Final  12/02/2019 05:40 AM 0.99 0.44 - 1.00 mg/dL Final  12/01/2019 06:13 AM 0.98 0.44 - 1.00 mg/dL Final  09/25/2019 03:24 PM 1.05 (H) 0.44 - 1.00 mg/dL Final  09/05/2019 11:15 AM 1.07 (H) 0.44 - 1.00 mg/dL Final  10/09/2012 06:20 AM 0.72 0.50 -  1.10 mg/dL Final  10/08/2012 09:44 PM 0.80 0.50 - 1.10 mg/dL Final  01/12/2011 11:00 AM 0.83 0.50 - 1.10 mg/dL Final  10/22/2010 12:35 AM 0.63 0.50 - 1.10 mg/dL Final     PMHx:   Past Medical History:  Diagnosis Date  . Arthritis   . Atrial fibrillation ([place])   . Essential hypertension   . GERD (gastroesophageal reflux disease)   . Hyperlipemia   . Hypothyroidism   . Iron deficiency anemia 05/07/2019  . Migraine   . Mitral regurgitation   . Vertigo     Past Surgical History:  Procedure Laterality Date  . APPENDECTOMY    . BREAST BIOPSY Right    benign  . BREAST EXCISIONAL BIOPSY Left    Benign  . BREAST EXCISIONAL BIOPSY Left    Benign  . BREAST LUMPECTOMY  30 yrs ago   Dr. Milbert Coulter- rt breast  .  BREAST LUMPECTOMY  15 yrs ago   left- Dr. Romona Curls  . BREAST LUMPECTOMY  01/2011  . CARDIOVERSION N/A 11/10/2019   Procedure: CARDIOVERSION;  Surgeon: Arnoldo Lenis, MD;  Location: [facility];  Service: Endoscopy;  Laterality: N/A;  . CARDIOVERSION N/A 02/19/2020   Procedure: CARDIOVERSION;  Surgeon: Arnoldo Lenis, MD;  Location: [facility];  Service: Endoscopy;  Laterality: N/A;  . CHOLECYSTECTOMY    . COLONOSCOPY    . COLONOSCOPY  04/13/2011   Procedure: COLONOSCOPY;  Surgeon: Rogene Houston, MD;  Location: [facility];  Service: Endoscopy;  Laterality: N/A;  930  . MASTECTOMY, PARTIAL  01/13/2011   Procedure: MASTECTOMY PARTIAL;  Surgeon: Adin Hector, MD;  Location: [facility];  Service: General;  Laterality: Left;  left partial mastectomy with needle locallization  . VAGINAL HYSTERECTOMY     2 partials    Family Hx:  Family History  Problem Relation Age of Onset  . Heart disease Mother   . Stroke Sister   . Heart disease Brother   . Atrial fibrillation Sister   . Coronary artery disease Brother   . Lung cancer Son   . Healthy Son   . Colon cancer Neg Hx     Social History:  reports that she quit smoking about 31 years ago. Her smoking use included cigarettes. She has a 20.00 pack-year smoking history. She has never used smokeless tobacco. She reports that she does not drink alcohol and does not use drugs. Obtained via chart review as encephalopathy  Allergies:  Allergies  Allergen Reactions  . Feraheme [Ferumoxytol]     Reacted during infusion requiring medical intervention. Increased BP    Medications: Prior to Admission medications   Medication Sig Start Date End Date Taking? Authorizing Provider  apixaban (ELIQUIS) 2.5 MG TABS tablet Take 1 tablet (2.5 mg total) by mouth 2 (two) times daily. 04/09/20 05/09/20 Yes Shahmehdi, Valeria Batman, MD  Calcium Carb-Cholecalciferol (CALCIUM 600 + D PO) Take 1 tablet by mouth 2 (two) times daily.   Yes [provider]  cetirizine (ZYRTEC) 10 MG tablet Take 10 mg by mouth daily.   Yes [provider]  cholecalciferol (VITAMIN D3) 25 MCG (1000 UNIT) tablet Take 1,000 Units by mouth daily.   Yes [provider]  escitalopram (LEXAPRO) 10 MG tablet Take 1 tablet by mouth daily. 04/27/20  Yes [provider]  ketotifen (ZADITOR) 0.025 % ophthalmic solution Place 1 drop into both eyes 2 (two) times daily as needed (allergies).   Yes [provider]  levothyroxine (SYNTHROID, [place])  112 MCG tablet Take 112 mcg by mouth daily before breakfast.  05/02/16  Yes [provider]  Melatonin 10 MG CAPS Take 10 mg by mouth at bedtime.   Yes [provider]  metoprolol succinate (TOPROL-XL) 50 MG 24 hr tablet Take 1 tablet (50 mg total) by mouth daily. Take with or immediately following a meal. 04/10/20 05/10/20 Yes Shahmehdi, Valeria Batman, MD  omeprazole (PRILOSEC) 20 MG capsule Take 20 mg by mouth every Monday, Wednesday, and Friday.   Yes [provider]  Potassium Chloride ER 20 MEQ TBCR Take 20 mEq by mouth in the morning and at bedtime. 04/27/20  Yes Strader, Fransisco Hertz, PA-C  torsemide (DEMADEX) 20 MG tablet Take 1 tablet (20 mg total) by mouth 2 (two) times daily. 04/27/20 07/26/20 Yes Strader, Fransisco Hertz, PA-C  Abatacept (ORENCIA CLICKJECT) 852 MG/ML SOAJ Inject 125 mg into the skin every 7 (seven) days. 03/08/20   Ofilia Neas, PA-C    I have reviewed the patient's current and prior to admission medications.  She is not able to communicate at this time given code stroke  Labs:  BMP Latest Ref Rng & Units May 11, 2020 04/22/2020 04/12/2020  Glucose 70 - 99 mg/dL 88 147(H) 285(H)  BUN 8 - 23 mg/dL 73(H) 73(H) 68(H)  Creatinine 0.44 - 1.00 mg/dL 3.58(H) 3.58(H) 3.54(H)  BUN/Creat Ratio 6 - 22 (calc) - - -  Sodium 135 - 145 mmol/L 137 132(L) 128(L)  Potassium 3.5 - 5.1 mmol/L 6.1(H) 7.6(HH) 7.0(HH)  Chloride 98 - 111 mmol/L 99 98 93(L)  CO2 22 - 32  mmol/L 12(L) 8(L) 11(L)  Calcium 8.9 - 10.3 mg/dL 9.5 9.0 8.4(L)    Urinalysis    Component Value Date/Time   COLORURINE YELLOW 04/12/2020 2131   APPEARANCEUR HAZY (A) 04/13/2020 2131   LABSPEC 1.015 04/15/2020 2131   PHURINE 5.0 04/20/2020 2131   [place] 04/27/2020 2131   HGBUR NEGATIVE 04/16/2020 2131   [place] NEGATIVE 04/16/2020 2131   [place] NEGATIVE 04/11/2020 2131   PROTEINUR 100 (A) 04/19/2020 2131   NITRITE NEGATIVE 04/30/2020 2131   LEUKOCYTESUR NEGATIVE 04/30/2020 2131     ROS:  Unable to obtain 2/2 encephalopathy   Physical Exam: Vitals:   2020-05-11 0600 05/11/20 0731  BP: (!) 116/45   Pulse: (!) 47   Resp: (!) 24   Temp:  97.6 F (36.4 C)  SpO2: 98%      General: elderly female in bed on bipap  HEENT: NCAT Neck: supple trachea midline Heart: bradycardia S1S2 no rub Lungs: coarse breath sounds with bipap Abdomen: soft/nd no overt ttp with limitation of AMS Extremities: trace to 1+ edema lower extremities Skin: no rash on extremities exposed Neuro: not verbalizing and team is moving her soon to CT Psych unable to assess   Assessment/Plan:  # Encephalopathy metabolic - code stroke just called.  Setting of AKI, hypoglycemia, hyperkalemia  - She is getting CT stat - primary team at bedside on my exam   # Hyperkalemia  - getting CT head and this will guide care acutely - would start bicarb gtt to temporize K if tolerated from resp standpoint; if unable to tolerate try lasix 80 mg IV once - would defer insulin with hypoglycemia - note a CMP is pending  # AKI - setting of pre-renal insults with recent diuretic titration to optimize CHF/overload    # combined systolic and diastolic CHF  - optimize volume status  - limited resp reserve and per staff is Do  not intubate - note improvement on CXR from ER  # Metabolic acidosis  - start gentle bicarb gtt as tolerated   # Anemia chronic disease  - no indication for PRBC's and hold  ESA with concern for acute CVA  Will discuss plans again with primary team after CT results/MRI known  Claudia Desanctis 11-May-2020 10:25 AM

## 2020-05-09 NOTE — Consult Note (Addendum)
Cardiology Consultation:   Patient ID: Erin Schmidt MRN: [id number]; DOB: Jan 06, 1933  Admit date: 04/22/2020 Date of Consult: May 19, 2020  PCP:  Sharilyn Sites, MD   [place]  Cardiologist:  Carlyle Dolly, MD  Electrophysiologist: Dr. Lovena Le   Patient Profile:   Erin Schmidt is a 87 y.o. female with past medical history of persistent atrial fibrillation(s/p DCCVin 11/2019 with recurrence at follow-up, repeat DCCV in 02/2020 following initiation of Amiodarone and recurrence at follow-up --> rate-control pursued since), chronic combined systolic and diastolic CHF (EF 16% by echo in 03/2020),moderate to severe MR,HTN, hypothyroidism, iron deficiency anemia and Stage 3 CKD who is being seen today for the evaluation of CHF at the request of Dr. Denton Brick.  History of Present Illness:   Erin Schmidt was admitted to [facility] in 03/2020 for an acute CHF exacerbation complicated by AKI with creatinine peaking at 2.91 and also underwent thoracentesis procedures with -1.9L removed. Was on Torsemide 20mg  BID at the time of discharge but at the time of her follow-up visit her weight had increased by over 5 lbs and she was volume overloaded on examination, therefore Torsemide was increased to 40mg  in AM/20mg  in PM. Her HR was in the 110's at rest and SBP had been in the 80's to 90's at home, therefore this was reviewed with Dr. Lovena Le and he recommended follow-up to discuss AV node ablation and PPM placement (has scheduled follow-up with him on 05/11/2020). She did have follow-up labs on 4/19 and creatinine was at 2.44 and K+ was at 5.3, therefore Torsemide was reduced to 20mg  BID and K-dur from 40 mEq daily to 20 mEq daily.   She presented [facility] last night for evaluation of worsening weakness and poor PO intake. Labs showed her glucose was less than 10. WBC 8.7, Hgb 9.2 (at baseline), platelets 443, Na+ 133, K+ 6.9 and creatinine 3.43. AST 69 and ALT < 5. Anion  gap 25. Mg 2.9. BNP 2512 (previously 636 last admission). Repeat CXR showed trace bilateral effusions but overall improved when compared to prior imaging. EKG showed atrial fibrillation, HR 55 with RBBB and IVCD.   While in the ED, she received IV Lasix 40mg  x1, D50, Sodium Bibarb and Lokelma. Repeat labs this AM show WBC trending up to 19.4. K+ 6.1 and creatinine 3.58.  At the time of this encounter, the patient was not conversational and would not follow any commands. Patient's nurse was already in the room and a rapid response was called. Glucose was 64 and D50 was administered. Respirations appeared labored and was placed on NRB with Respiratory called and BiPAP brought to her room. Decision was made for Code Stroke to be called and Head CT to be obtained.   Past Medical History:  Diagnosis Date  . Arthritis   . Atrial fibrillation ([place])   . Essential hypertension   . GERD (gastroesophageal reflux disease)   . Hyperlipemia   . Hypothyroidism   . Iron deficiency anemia 05/07/2019  . Migraine   . Mitral regurgitation   . Vertigo     Past Surgical History:  Procedure Laterality Date  . APPENDECTOMY    . BREAST BIOPSY Right    benign  . BREAST EXCISIONAL BIOPSY Left    Benign  . BREAST EXCISIONAL BIOPSY Left    Benign  . BREAST LUMPECTOMY  30 yrs ago   Dr. Milbert Coulter- rt breast  . BREAST LUMPECTOMY  15 yrs ago   left- Dr. Romona Curls  .  BREAST LUMPECTOMY  01/2011  . CARDIOVERSION N/A 11/10/2019   Procedure: CARDIOVERSION;  Surgeon: Arnoldo Lenis, MD;  Location: [facility];  Service: Endoscopy;  Laterality: N/A;  . CARDIOVERSION N/A 02/19/2020   Procedure: CARDIOVERSION;  Surgeon: Arnoldo Lenis, MD;  Location: [facility];  Service: Endoscopy;  Laterality: N/A;  . CHOLECYSTECTOMY    . COLONOSCOPY    . COLONOSCOPY  04/13/2011   Procedure: COLONOSCOPY;  Surgeon: Rogene Houston, MD;  Location: [facility];  Service: Endoscopy;  Laterality: N/A;  930  . MASTECTOMY, PARTIAL  01/13/2011    Procedure: MASTECTOMY PARTIAL;  Surgeon: Adin Hector, MD;  Location: [place];  Service: General;  Laterality: Left;  left partial mastectomy with needle locallization  . VAGINAL HYSTERECTOMY     2 partials     Home Medications:  Prior to Admission medications   Medication Sig Start Date End Date Taking? Authorizing Provider  apixaban (ELIQUIS) 2.5 MG TABS tablet Take 1 tablet (2.5 mg total) by mouth 2 (two) times daily. 04/09/20 05/09/20 Yes Shahmehdi, Valeria Batman, MD  Calcium Carb-Cholecalciferol (CALCIUM 600 + D PO) Take 1 tablet by mouth 2 (two) times daily.   Yes [provider]  cetirizine (ZYRTEC) 10 MG tablet Take 10 mg by mouth daily.   Yes [provider]  cholecalciferol (VITAMIN D3) 25 MCG (1000 UNIT) tablet Take 1,000 Units by mouth daily.   Yes [provider]  escitalopram (LEXAPRO) 10 MG tablet Take 1 tablet by mouth daily. 04/27/20  Yes [provider]  ketotifen (ZADITOR) 0.025 % ophthalmic solution Place 1 drop into both eyes 2 (two) times daily as needed (allergies).   Yes [provider]  levothyroxine (SYNTHROID, LEVOTHROID) 112 MCG tablet Take 112 mcg by mouth daily before breakfast.  05/02/16  Yes [provider]  Melatonin 10 MG CAPS Take 10 mg by mouth at bedtime.   Yes [provider]  metoprolol succinate (TOPROL-XL) 50 MG 24 hr tablet Take 1 tablet (50 mg total) by mouth daily. Take with or immediately following a meal. 04/10/20 05/10/20 Yes Shahmehdi, Valeria Batman, MD  omeprazole (PRILOSEC) 20 MG capsule Take 20 mg by mouth every Monday, Wednesday, and Friday.   Yes [provider]  Potassium Chloride ER 20 MEQ TBCR Take 20 mEq by mouth in the morning and at bedtime. 04/27/20  Yes Strader, Fransisco Hertz, PA-C  torsemide (DEMADEX) 20 MG tablet Take 1 tablet (20 mg total) by mouth 2 (two) times daily. 04/27/20 07/26/20 Yes Strader, Fransisco Hertz, PA-C  Abatacept (ORENCIA CLICKJECT) 354 MG/ML SOAJ Inject  125 mg into the skin every 7 (seven) days. 03/08/20   Ofilia Neas, PA-C    Inpatient Medications: Scheduled Meds: . apixaban  2.5 mg Oral BID  . calcium chloride      . Chlorhexidine Gluconate Cloth  6 each Topical Daily  . dextrose  1 ampule Intravenous Once  . dextrose      . sodium bicarbonate      . sodium zirconium cyclosilicate  10 g Oral Once   Continuous Infusions: . sodium bicarbonate 150 mEq in D5W infusion     PRN Meds: acetaminophen **OR** acetaminophen, polyethylene glycol  Allergies:    Allergies  Allergen Reactions  . Feraheme [Ferumoxytol]     Reacted during infusion requiring medical intervention. Increased BP    Social History:   Social History   Socioeconomic History  . Marital status: Married    Spouse name: Not on  file  . Number of children: 2  . Years of education: Not on file  . Highest education level: Not on file  Occupational History  . Occupation: RETIRED  Tobacco Use  . Smoking status: Former Smoker    Packs/day: 1.00    Years: 20.00    Pack years: 20.00    Types: Cigarettes    Quit date: 01/05/1989    Years since quitting: 31.3  . Smokeless tobacco: Never Used  Vaping Use  . Vaping Use: Never used  Substance and Sexual Activity  . Alcohol use: No  . Drug use: Never  . Sexual activity: Not on file  Other Topics Concern  . Not on file  Social History Narrative  . Not on file   Social Determinants of Health   Financial Resource Strain: Not on file  Food Insecurity: No Food Insecurity  . Worried About Charity fundraiser in the Last Year: Never true  . Ran Out of Food in the Last Year: Never true  Transportation Needs: No Transportation Needs  . Lack of Transportation (Medical): No  . Lack of Transportation (Non-Medical): No  Physical Activity: Not on file  Stress: Not on file  Social Connections: Not on file  Intimate Partner Violence: Not on file    Family History:    Family History  Problem Relation Age of Onset   . Heart disease Mother   . Stroke Sister   . Heart disease Brother   . Atrial fibrillation Sister   . Coronary artery disease Brother   . Lung cancer Son   . Healthy Son   . Colon cancer Neg Hx      ROS:   Unable to be obtained given AMS.   Physical Exam/Data:   Vitals:   May 27, 2020 0435 05-27-20 0500 05-27-20 0600 May 27, 2020 0731  BP:  114/85 (!) 116/45   Pulse:  (!) 34 (!) 47   Resp:  (!) 22 (!) 24   Temp:    97.6 F (36.4 C)  TempSrc:    Axillary  SpO2:  93% 98%   Weight: 76.1 kg     Height:        Intake/Output Summary (Last 24 hours) at 2020-05-27 1023 Last data filed at May 27, 2020 0318 Gross per 24 hour  Intake 223.75 ml  Output --  Net 223.75 ml   Last 3 Weights 05-27-2020 05/05/2020 04/20/2020  Weight (lbs) 167 lb 12.3 oz 155 lb 156 lb  Weight (kg) 76.1 kg 70.308 kg 70.761 kg     Body mass index is 27.08 kg/m.  General:  Elderly female lying in bed.  HEENT: normal Lymph: no adenopathy Neck: JVD at 10 cm. Endocrine:  No thryomegaly Vascular: No carotid bruits; FA pulses 2+ bilaterally without bruits  Cardiac:  Irregularly irregular. 2/6 SEM along Apex.  Lungs:  Decreased breath sounds along bases.  Abd: soft, nontender, no hepatomegaly  Ext: 1+ pitting edema Musculoskeletal:  No deformities, BUE and BLE strength normal and equal Skin: warm and dry  Neuro: Not response to verbal stimuli or following commands.   EKG:  The EKG was personally reviewed and demonstrates:  Atrial fibrillation, HR 55 with RBBB and IVCD.   Relevant CV Studies:  Limited Echocardiogram: 04/02/2020 IMPRESSIONS    1. Hypokinesis of the inferior wall (base, mid). Left ventricular  ejection fraction, by estimation, is 45 to 50%. The left ventricle has  mildly decreased function. There is mild left ventricular hypertrophy.  Left ventricular diastolic parameters are  indeterminate.  2. Right ventricular systolic function is low normal. The right  ventricular size is mildly  enlarged. There is moderately elevated  pulmonary artery systolic pressure.  3. Left atrial size was mildly dilated.  4. Right atrial size was moderately dilated.  5. Large pleural effusion.  6. There are a couple MR jets; the largest is eccentric directed  posteriorly along posterior wall of LA COmpared to previous report, MR is  more severe. . The mitral valve is abnormal. Severe mitral valve  regurgitation.  7. Tricuspid valve regurgitation is moderate.  8. The aortic valve is tricuspid. Aortic valve regurgitation is not  visualized. Mild to moderate aortic valve sclerosis/calcification is  present, without any evidence of aortic stenosis.  9. The inferior vena cava is dilated in size with <50% respiratory  variability, suggesting right atrial pressure of 15 mmHg.   Laboratory Data:  High Sensitivity Troponin:   Recent Labs  Lab 04/02/20 0148 04/02/20 0348 04/02/20 0529 04/02/20 0755  TROPONINIHS 20* 21* 19* 17     Chemistry Recent Labs  Lab 04/14/2020 2057 05/05/2020 2343 05-11-20 0534  NA 128* 132* 137  K 7.0* 7.6* 6.1*  CL 93* 98 99  CO2 11* 8* 12*  GLUCOSE 285* 147* 88  BUN 68* 73* 73*  CREATININE 3.54* 3.58* 3.58*  CALCIUM 8.4* 9.0 9.5  GFRNONAA 12* 12* 12*  ANIONGAP 24* 26* 26*    Recent Labs  Lab 04/27/20 1112 04/20/2020 1903 May 11, 2020 0534  PROT 6.3* 6.9 6.6  ALBUMIN 3.3* 3.6 3.4*  AST 33 69* 685*  ALT 32 <5 285*  ALKPHOS 162* 186* 187*  BILITOT 1.4* 2.7* 2.3*   Hematology Recent Labs  Lab 05/08/2020 1903 2020/05/11 0534 05-11-2020 0955  WBC 8.7 19.4* 21.0*  RBC 4.08 3.65* 3.68*  HGB 9.2* 8.3* 8.5*  HCT 34.3* 32.2* 33.1*  MCV 84.1 88.2 89.9  MCH 22.5* 22.7* 23.1*  MCHC 26.8* 25.8* 25.7*  RDW 20.8* 20.4* 20.5*  PLT 443* 403* 368   BNP Recent Labs  Lab 04/22/2020 1903  BNP 2,512.0*    DDimer No results for input(s): DDIMER in the last 168 hours.   Radiology/Studies:  DG Chest Port 1 View  Result Date: 04/13/2020 CLINICAL DATA:   87 year old female with shortness of breath. EXAM: PORTABLE CHEST 1 VIEW COMPARISON:  Chest radiograph dated 04/06/2020. FINDINGS: Significant interval improvement in the bilateral lower lung field densities compared to prior radiograph. Bilateral lower lung field densities may represent residual infection or mild interstitial edema. Trace bilateral pleural effusions may be present. No pneumothorax. Stable cardiomegaly. Atherosclerotic calcification of the aorta. No acute osseous pathology. IMPRESSION: Significant interval improvement in the bilateral lower lung field densities compared to prior radiograph. Electronically Signed   By: Anner Crete M.D.   On: 05/06/2020 19:40   CT HEAD CODE STROKE WO CONTRAST  Result Date: 05-11-20 CLINICAL DATA:  Code stroke.  Acute stroke suspected EXAM: CT HEAD WITHOUT CONTRAST TECHNIQUE: Contiguous axial images were obtained from the base of the skull through the vertex without intravenous contrast. COMPARISON:  02/26/2017 FINDINGS: Brain: No evidence of acute infarction, hemorrhage, hydrocephalus, extra-axial collection or mass lesion/mass effect. Chronic small vessel ischemia in the cerebral white matter. Cerebral volume loss in keeping with aging. Vascular: Atheromatous calcification. Skull: Normal. Negative for fracture or focal lesion. Sinuses/Orbits: No acute finding. Other: These results were communicated to [place] [facility] 05-03-22by text page via the [facility] messaging system. ASPECTS [facility] Stroke Program Early CT Score) Not scored without localizing symptoms. IMPRESSION:  Aging brain without superimposed acute finding. Electronically Signed   By: Monte Fantasia M.D.   On: 05-05-20 10:13     Assessment and Plan:   1. Acute on Chronic Combined Systolic and Diastolic CHF - Presented with worsening weakness and decreased appreciate but weight had increased by 10+ lbs on her home scales. Torsemide recently reduced to 20mg  BID given her worsening AKI  in the outpatient setting.  - BNP 2512 on admission (previously 636 last admission). Repeat CXR showed trace bilateral effusions but overall improved when compared to prior imaging. EKG showed atrial fibrillation, HR 55 with RBBB and IVCD.  - PTA Toprol-XL held given bradycardia on admission in the setting of electrolyte abnormalities. Torsemide held as well given AKI and Nephrology has been consulted to assist with diuretic management given her AKI. Pending Head CT results this AM, would consider cardiogenic vs. septic shock given her presentation and lab abnormalities. Repeat echocardiogram pending once she is stable.  2. Persistent Atrial Fibrillation - She underwent DCCV and failed Amiodarone as well. Previously reviewed with EP and scheduled to be evaluated by Dr. Lovena Le next week to discuss AV node ablation and PPM placement. PTTA Toprol-XL currently held given bradycardia and hypotension. She has been continued on Eliquis 2.5mg  BID at this time.   3. Mitral Regurgitation - Severe by echocardiogram last month and previously reviewed with Dr. Harl Bowie and outpatient TEE was going to be arranged once her fluid status improved. Plan for repeat echo this admission.   4. Acute on Chronic Stage 3 CKD/Hyperkalemia - Her creatinine has been variable from 1.3 - 2.9 over the past month. Elevated to 3.43 on admission and trending up to 3.58 today. K+ also at 6.9 on admission. Nephrology has been consulted to assist with management.   5. Anemia - Hgb at 9.2 on admission which is close to her baseline. Followed by Hematology as an outpatient.   6. AMS - By review of notes had AMS upon arrival to the ED but was hypoglycemic with glucose < 10. Recurrent episode of unresponsiveness this AM and Code Stroke has been called with Head CT pending. Given her elevated LFT's, metabolic acidosis and up-trending WBC count, would also be concerned about septic shock.      For questions or updates, please contact [place] Please consult [email or web address] for contact info under    Signed, Erma Heritage, PA-C  May 05, 2020 10:23 AM   Attending note  Patient seen and discussed with PA Ahmed Prima, I agree with her documentation. 87 yo female history of afib recently failed amiodarone, chronic diastolic HF, mitral regurgitation, RA admitted with generliazed weakness and leg swelling. Recent admit 04/02/20 with volume overload requiring diuresis, bilateral thoracentesis.    Admit labs  Na 133 K 6.9 BUN 70 Cr 3.43 ASST 69 Tbili 2.7 Gluc <20  WBC 8.7 Hgb 9.2 Plt 443 Mg 2.9 bicard 11 BNP 2512 COVID neg ABG 7.23/27.5/142/13 CXR trace bilatearl effusions EKG rate controlled afib, bifascicular block    Assessment/Plan  1. Acute on chronic combined systolic/diastolic HF - echo 7/74/12 LVEF 45-50%, indet dd, low normal RV, mod to severe MR - ongoing issues with fluid retention, diuresis has been limited by labile renal function - discharge weight 04/09/20 156 lbs, reported admit weight yesterday 155 lbs though today weight reported 167 lbs. BNP is markedly higher than prior values at 2512 - repeat echo  2. Afib  - complicated history, most recently had failed amidoarone therapy and was discontinued by EP with  plans for rate control - she was to f/u with EP as there were some thoughts about potential consideration for av nodal ablation and pacemaker if ongoing issues with afib with high rates  3. AKI on CKD - labile Cr recently due to ongoing issues with fluid overload and diuresis - Cr 1.3-2.9 over last 2 months - admit Cr  4. Metabolic acidosis - with bicarb down to 8 by chemistry - lactic acid is pending - if not lactic acidosis, perhaps related to her AKI on CKD   5. Hyperkalemia - peaked at 7.6, down to 6.1 this morning - received calcium gluconate, lokelma 10g x 1 with repeat pending this AM.    6. Hypoglycemia - resolved, per primary team  7. Elevated liver enzymes -  possibly from venous congestion. Could be some component of sepsis, procalcitonin is pending.  - f/u repeat echo - defer alternative etioogies to primary team.   8. Leukocytosis -WBC 19.4.  some documented hypothermia, procalcitonin 0.35 - will discuss with primary team if would consider broad spectrum abx   Addendum Limited responsiveness this AM, code stroke called. Imaging without significant findings. ABG came back with pH 6.9/51/146/10, lacitic acid came back >11. Ongoing family meeting. If plans for continued medical care would start peripheral levophed followed by central line placement for vasopressors and coox/cvp, likely broad spectrum abx per primary team,and we would f/u echo.   Carlyle Dolly MD

## 2020-05-09 NOTE — Progress Notes (Signed)
87 yo with medical history significant foratrial fibrillation on Eliquis, hypertension, hypothyroidism, combined systolic anddiastolic heart failure, rheumatoid arthritisand iron deficiency anemia  admitted on 04/19/2020 with severe hypoglycemia with serum glucose less than 10 resulting in metabolic encephalopathy as well as AKI on CKD with hyperkalemia and metabolic acidosis  - -- Despite interventions patient continues to have recurrent hypoglycemia, severe metabolic acidosis and significant hyperkalemia persisted even after interventions -Around 9 AM on 05/05/2020 patient became largely unresponsive--CT head and MRI brain without acute strokes - -There was concern for infection, leukocytosis and procalcitonin were noted, lactic acid was greater than 11, -antibiotics and blood cultures were ordered at this time however patient transition to comfort care shortly after, - AST 33>>69>>685>>1,074 ALT 32 >285 >>418,  T Bili 2.2 -Creatinine 3.43 >>3.58>>3.92 -Glucose < 10 >> 61 >>96>>64 >>106  -Patient developed acute liver failure, AKI on CKD with persistent metabolic acidosis and persistent hyperkalemia despite interventions, as well as recurrent hypoglycemic episodes  -Case discussed with cardiology service and nephrologist -Family conference at bedside with patient's husband and patient's son, chaplain was present - Family requested transition to comfort care -Additional family members were able to visit - -Palliative care input appreciated - Later on the day after family visitations, with family still at bedside BiPAP was removed----patient expired about 10 minutes later at 1347 PM

## 2020-05-09 NOTE — Plan of Care (Signed)
Late entry for 11:08 call.  Spoke with Dr. Denton Brick and the patient is now comfort care.  Please do not hesitate to contact me if I can be of assistance.    Claudia Desanctis, MD 12:09 PM 2020/05/01

## 2020-05-09 NOTE — Patient Outreach (Signed)
[place] [facility]) Care Management  04-30-2020  [name] 1932-03-25 [id number]   Noted that member passed away while hospitalized.  Will close case at this time.  Valente David, [place], MSN [place] [phone]

## 2020-05-09 NOTE — Death Summary Note (Signed)
DEATH SUMMARY   Patient Details  Name: Erin Schmidt MRN: [id number] DOB: [birth year]  Admission/Discharge Information   Admit Date:  04-May-2020  Date of Death: Date of Death: 05-05-20  Time of Death: Time of Death: 03-Apr-1345  Length of Stay: 1  Referring Physician: Sharilyn Sites, MD   Reason(s) for Hospitalization  ---  87 y.o.femalewith medical history significant foratrial fibrillation on Eliquis, hypertension, hypothyroidism, combined systolic anddiastolic heart failure, rheumatoid arthritisand iron deficiency anemia  admitted on 2020/05/04 with severe hypoglycemia with serum glucose less than 10 resulting in metabolic encephalopathy as well as AKI on CKD with hyperkalemia and metabolic acidosis  - -- Despite interventions patient continues to have recurrent hypoglycemia, severe metabolic acidosis and significant hyperkalemia persisted even after interventions -Around 9 AM on May 05, 2020 patient became largely unresponsive--CT head and MRI brain without acute strokes - -There was concern for infection, leukocytosis and procalcitonin were noted, lactic acid was greater than 11, -antibiotics and blood cultures were ordered at this time however patient transition to comfort care shortly after, - AST 33>>69>>685>>1,074 ALT 32 >285 >>418,  T Bili 2.2 -Creatinine 3.43 >>3.58>>3.92 -Glucose < 10 >> 61 >>96>>64 >>106  -Patient developed acute liver failure, AKI on CKD with persistent metabolic acidosis and persistent hyperkalemia despite interventions, as well as recurrent hypoglycemic episodes  -Case discussed with cardiology service and nephrologist -Family conference at bedside with patient's husband and patient's son, chaplain was present - Family requested transition to comfort care -Additional family members were able to visit - -Palliative care input appreciated - Later on the day after family visitations, with family still at bedside BiPAP was removed----patient expired  about 10 minutes later at 1347 PM   Diagnoses  Preliminary cause of death:  Secondary Diagnoses (including complications and co-morbidities):  Principal Problem:   Acute kidney injury superimposed on CKD with persistent hyperkalemia and persistent metabolic acidosis Active Problems:   Hyperkalemia   Hypoglycemia   Acute liver failure-acutely severely elevated LFTs-   Acute respiratory failure with hypoxia (HCC)   Acute on chronic combined systolic and diastolic CHF/H/o dCHF, now with New Systolic CHF   Acute metabolic encephalopathy   Acute-on-chronic kidney injury ([place])   Rheumatoid arthritis of multiple sites without rheumatoid factor ([place])   Chronic combined systolic and diastolic CHF (congestive heart failure) [facility])   Brief Hospital Course (including significant findings, care, treatment, and services provided and events leading to death)  Erin Schmidt is a 87 y.o. year old female with medical history significant foratrial fibrillation on Eliquis, hypertension, hypothyroidism, combined systolic anddiastolic heart failure, rheumatoid arthritisand iron deficiency anemia  admitted on 05-04-2020 with severe hypoglycemia with serum glucose less than 10 resulting in metabolic encephalopathy as well as AKI on CKD with hyperkalemia and metabolic acidosis  - -- Despite interventions patient continues to have recurrent hypoglycemia, severe metabolic acidosis and significant hyperkalemia persisted even after interventions -Around 9 AM on 05/05/20 patient became largely unresponsive--CT head and MRI brain without acute strokes - -There was concern for infection, leukocytosis and procalcitonin were noted, lactic acid was greater than 11, -antibiotics and blood cultures were ordered at this time however patient transition to comfort care shortly after, - AST 33>>69>>685>>1,074 ALT 32 >285 >>418,  T Bili 2.2 -Creatinine 3.43 >>3.58>>3.92 -Glucose < 10 >> 61 >>96>>64 >>106  -Patient  developed acute liver failure, AKI on CKD with persistent metabolic acidosis and persistent hyperkalemia despite interventions, as well as recurrent hypoglycemic episodes  -Case discussed with cardiology service and nephrologist -Family conference  at bedside with patient's husband and patient's son, chaplain was present - Family requested transition to comfort care -Additional family members were able to visit - -Palliative care input appreciated - Later on the day after family visitations, with family still at bedside BiPAP was removed----patient expired about 10 minutes later at 1347 PM   Pertinent Labs and Studies  Significant Diagnostic Studies DG Chest 2 View  Result Date: 04/06/2020 CLINICAL DATA:  Post LEFT thoracentesis EXAM: CHEST - 2 VIEW COMPARISON:  04/05/2020 FINDINGS: Enlargement of cardiac silhouette with pulmonary vascular congestion. Atherosclerotic calcification aorta. Accentuation of perihilar markings likely reflect mild pulmonary edema. Decreased LEFT pleural effusion post thoracentesis with mild residual LEFT basilar atelectasis. New airspace consolidation in RIGHT middle lobe question pneumonia. No pneumothorax. Bones demineralized. IMPRESSION: Significantly decreased LEFT pleural effusion with mild residual LEFT basilar atelectasis. No pneumothorax following thoracentesis. Enlargement of cardiac silhouette with pulmonary vascular congestion and minimal perihilar edema. New RIGHT middle lobe airspace consolidation question pneumonia. Electronically Signed   By: Lavonia Dana M.D.   On: 04/06/2020 11:08   DG Chest 2 View  Result Date: 04/05/2020 CLINICAL DATA:  BILATERAL pleural effusions, post RIGHT thoracentesis EXAM: CHEST - 2 VIEW COMPARISON:  04/03/2020 FINDINGS: PA view performed with expiratory technique due to preceding thoracentesis. Enlargement of cardiac silhouette with pulmonary vascular congestion. Atherosclerotic calcification aorta. Perihilar infiltrates  consistent with pulmonary edema. Minimal residual RIGHT pleural effusion and basilar atelectasis post thoracentesis. No pneumothorax. Moderate LEFT pleural effusion, slightly increased from previous exam. No acute osseous findings. IMPRESSION: Enlargement of cardiac silhouette with pulmonary vascular congestion and mild pulmonary edema. BILATERAL pleural effusions and basilar atelectasis, slightly increased on LEFT as well as decreased on RIGHT post RIGHT thoracentesis. No pneumothorax following RIGHT thoracentesis. Electronically Signed   By: Lavonia Dana M.D.   On: 04/05/2020 13:07   DG Chest 2 View  Result Date: 04/03/2020 CLINICAL DATA:  Shortness of breath for 1 week. EXAM: CHEST - 2 VIEW COMPARISON:  04/02/2020 and prior exams FINDINGS: Enlargement of the cardiopericardial silhouette is noted. Moderate bilateral pleural effusions and bilateral LOWER lung atelectasis/consolidation noted. No pneumothorax or acute bony abnormality. IMPRESSION: Enlargement of the cardiopericardial silhouette with moderate bilateral pleural effusions and bilateral LOWER lung atelectasis/consolidation. Electronically Signed   By: Margarette Canada M.D.   On: 04/03/2020 10:47   MR BRAIN WO CONTRAST  Result Date: 2020/05/18 CLINICAL DATA:  Neuro deficit, acute, stroke suspected. EXAM: MRI HEAD WITHOUT CONTRAST TECHNIQUE: Multiplanar, multiecho pulse sequences of the brain and surrounding structures were obtained without intravenous contrast. COMPARISON:  Noncontrast head CT performed earlier today May 18, 2020. FINDINGS: The patient was unable to tolerate the full examination. As a result, only axial and coronal diffusion-weighted imaging, as well as a sagittal T1 weighted sequence, could be obtained. The sagittal diffusion-weighted sequence is moderate to severely motion degraded. No evidence of acute infarction. Mild generalized cerebral atrophy. Incompletely assessed cerebral white matter chronic small vessel ischemic disease  IMPRESSION: The patient was unable to tolerate the full examination due to altered mental status. As a result, only axial and coronal diffusion-weighted imaging, as well as a motion degraded sagittal T1 weighted sequence, could be obtained. No evidence of acute infarction. Cerebral atrophy with incompletely assessed cerebral white matter chronic small vessel ischemic disease. Electronically Signed   By: Kellie Simmering DO   On: May 18, 2020 10:33   DG Chest Port 1 View  Result Date: 04/25/2020 CLINICAL DATA:  87 year old female with shortness of breath. EXAM: PORTABLE CHEST 1 VIEW  COMPARISON:  Chest radiograph dated 04/06/2020. FINDINGS: Significant interval improvement in the bilateral lower lung field densities compared to prior radiograph. Bilateral lower lung field densities may represent residual infection or mild interstitial edema. Trace bilateral pleural effusions may be present. No pneumothorax. Stable cardiomegaly. Atherosclerotic calcification of the aorta. No acute osseous pathology. IMPRESSION: Significant interval improvement in the bilateral lower lung field densities compared to prior radiograph. Electronically Signed   By: Anner Crete M.D.   On: 04/22/2020 19:40   DG Chest Port 1 View  Result Date: 04/02/2020 CLINICAL DATA:  Shortness of breath for 4-5 days, worsening, COVID testing pending EXAM: PORTABLE CHEST 1 VIEW COMPARISON:  Radiograph 12/01/2019, CT 12/01/2019 FINDINGS: Chronically coarsened interstitial and bronchitic features with new patchy consolidative opacity in the right infrahilar lung and retrocardiac space as well as likely layering bilateral effusions. Mild pulmonary vascular congestion is present. Cardiac size is similar to prior though portions are obscured by overlying opacity. The aorta is calcified. The remaining cardiomediastinal contours are unremarkable. No other acute osseous or soft tissue abnormality. Degenerative changes are present in the imaged spine and  shoulders. Telemetry leads overlie the chest. IMPRESSION: 1. Patchy consolidative opacity in the right infrahilar lung and retrocardiac space with likely layering bilateral effusions, could reflect pneumonia, asymmetric edema or combination there of. 2. Chronically coarsened interstitial and bronchitic features. 3.  Aortic Atherosclerosis (ICD10-I70.0). Electronically Signed   By: Lovena Le M.D.   On: 04/02/2020 02:07   CT HEAD CODE STROKE WO CONTRAST  Result Date: 05/07/20 CLINICAL DATA:  Code stroke.  Acute stroke suspected EXAM: CT HEAD WITHOUT CONTRAST TECHNIQUE: Contiguous axial images were obtained from the base of the skull through the vertex without intravenous contrast. COMPARISON:  02/26/2017 FINDINGS: Brain: No evidence of acute infarction, hemorrhage, hydrocephalus, extra-axial collection or mass lesion/mass effect. Chronic small vessel ischemia in the cerebral white matter. Cerebral volume loss in keeping with aging. Vascular: Atheromatous calcification. Skull: Normal. Negative for fracture or focal lesion. Sinuses/Orbits: No acute finding. Other: These results were communicated to Zamorah Ailes at [facility] Apr 29, 2022by text page via the [facility] messaging system. ASPECTS [facility] Stroke Program Early CT Score) Not scored without localizing symptoms. IMPRESSION: Aging brain without superimposed acute finding. Electronically Signed   By: Monte Fantasia M.D.   On: 05/07/2020 10:13   ECHOCARDIOGRAM LIMITED  Result Date: 04/02/2020    ECHOCARDIOGRAM LIMITED REPORT   Patient Name:   DONNAMAE MUILENBURG Date of Exam: 04/02/2020 Medical Rec #:  [id number]         Height:       66.0 in Accession #:    [id number]        Weight:       153.0 lb Date of Birth:  10/14/1932         BSA:          1.785 m Patient Age:    87 years          BP:           95/63 mmHg Patient Gender: F                 HR:           112 bpm. Exam Location:  [facility] Procedure: Limited Echo, Cardiac Doppler and Color Doppler Indications:     CHF-Acute Diastolic  History:        Patient has prior history of Echocardiogram examinations, most  recent 10/16/2019. CHF, Arrythmias:Atrial Fibrillation,                 Signs/Symptoms:Shortness of Breath; Risk Factors:Hypertension                 and Former Smoker. DOE. Orthopnea. Elevated troponin.  Sonographer:    Clayton Lefort RDCS (AE) Referring Phys: [id number] OLADAPO ADEFESO IMPRESSIONS  1. Hypokinesis of the inferior wall (base, mid). Left ventricular ejection fraction, by estimation, is 45 to 50%. The left ventricle has mildly decreased function. There is mild left ventricular hypertrophy. Left ventricular diastolic parameters are indeterminate.  2. Right ventricular systolic function is low normal. The right ventricular size is mildly enlarged. There is moderately elevated pulmonary artery systolic pressure.  3. Left atrial size was mildly dilated.  4. Right atrial size was moderately dilated.  5. Large pleural effusion.  6. There are a couple MR jets; the largest is eccentric directed posteriorly along posterior wall of LA COmpared to previous report, MR is more severe. . The mitral valve is abnormal. Severe mitral valve regurgitation.  7. Tricuspid valve regurgitation is moderate.  8. The aortic valve is tricuspid. Aortic valve regurgitation is not visualized. Mild to moderate aortic valve sclerosis/calcification is present, without any evidence of aortic stenosis.  9. The inferior vena cava is dilated in size with <50% respiratory variability, suggesting right atrial pressure of 15 mmHg. FINDINGS  Left Ventricle: Hypokinesis of the inferior wall (base, mid). Left ventricular ejection fraction, by estimation, is 45 to 50%. The left ventricle has mildly decreased function. The left ventricular internal cavity size was normal in size. There is mild left ventricular hypertrophy. Left ventricular diastolic parameters are indeterminate. Right Ventricle: The right ventricular size is mildly  enlarged. Right vetricular wall thickness was not assessed. Right ventricular systolic function is low normal. There is moderately elevated pulmonary artery systolic pressure. The tricuspid regurgitant velocity is 3.15 m/s, and with an assumed right atrial pressure of 15 mmHg, the estimated right ventricular systolic pressure is 42.6 mmHg. Left Atrium: Left atrial size was mildly dilated. Right Atrium: Right atrial size was moderately dilated. Pericardium: Trivial pericardial effusion is present. Mitral Valve: There are a couple MR jets; the largest is eccentric directed posteriorly along posterior wall of LA COmpared to previous report, MR is more severe. The mitral valve is abnormal. There is moderate thickening of the mitral valve leaflet(s). Mild mitral annular calcification. Severe mitral valve regurgitation. Tricuspid Valve: The tricuspid valve is normal in structure. Tricuspid valve regurgitation is moderate. Aortic Valve: The aortic valve is tricuspid. Aortic valve regurgitation is not visualized. Mild to moderate aortic valve sclerosis/calcification is present, without any evidence of aortic stenosis. Pulmonic Valve: The pulmonic valve was not well visualized. Pulmonic valve regurgitation is not visualized. Aorta: The aortic root and ascending aorta are structurally normal, with no evidence of dilitation. Venous: The inferior vena cava is dilated in size with less than 50% respiratory variability, suggesting right atrial pressure of 15 mmHg. IAS/Shunts: No atrial level shunt detected by color flow Doppler. Additional Comments: There is a large pleural effusion. LEFT VENTRICLE PLAX 2D LVIDd:         4.71 cm LVIDs:         3.62 cm LV PW:         1.29 cm LV IVS:        1.35 cm LVOT diam:     1.80 cm LVOT Area:     2.54 cm  LEFT ATRIUM  Index LA diam:    4.90 cm 2.75 cm/m   AORTA Ao Root diam: 2.80 cm Ao Asc diam:  3.30 cm MR Peak grad:    97.2 mmHg   TRICUSPID VALVE MR Mean grad:    61.0 mmHg   TR  Peak grad:   39.7 mmHg MR Vmax:         493.00 cm/s TR Vmax:        315.00 cm/s MR Vmean:        359.0 cm/s MR PISA:         3.08 cm    SHUNTS MR PISA Eff ROA: 24 mm      Systemic Diam: 1.80 cm MR PISA Radius:  0.70 cm Dorris Carnes MD Electronically signed by Dorris Carnes MD Signature Date/Time: 04/02/2020/5:26:49 PM    Final    US THORACENTESIS ASP PLEURAL SPACE W/IMG GUIDE  Result Date: 04/06/2020 INDICATION: Diffuse LEFT pleural effusion EXAM: ULTRASOUND GUIDED DIAGNOSTIC AND THERAPEUTIC LEFT THORACENTESIS MEDICATIONS: None. COMPLICATIONS: None immediate PROCEDURE: An ultrasound guided thoracentesis was thoroughly discussed with the patient and questions answered. The benefits, risks, alternatives and complications were also discussed. The patient understands and wishes to proceed with the procedure. Written consent was obtained. Ultrasound was performed to localize and mark an adequate pocket of fluid in the LEFT chest. The area was then prepped and draped in the normal sterile fashion. 1% Lidocaine was used for local anesthesia. Under ultrasound guidance a 8 French thoracentesis catheter was introduced. Thoracentesis was performed. The catheter was removed and a dressing applied. Procedure tolerated well by patient without immediate complication. FINDINGS: A total of approximately 900 mL of clear yellow LEFT pleural fluid was removed. Samples were sent to the laboratory as requested by the clinical team. IMPRESSION: Successful ultrasound guided LEFT thoracentesis yielding 900 mL of pleural fluid. Electronically Signed   By: Lavonia Dana M.D.   On: 04/06/2020 11:39   US THORACENTESIS ASP PLEURAL SPACE W/IMG GUIDE  Result Date: 04/05/2020 INDICATION: BILATERAL pleural effusions, shortness of breath, orthopnea EXAM: ULTRASOUND GUIDED DIAGNOSTIC AND THERAPEUTIC RIGHT THORACENTESIS MEDICATIONS: None. COMPLICATIONS: None immediate. PROCEDURE: An ultrasound guided thoracentesis was thoroughly discussed with the  patient and questions answered. The benefits, risks, alternatives and complications were also discussed. The patient understands and wishes to proceed with the procedure. Written consent was obtained. Ultrasound was performed to localize and mark an adequate pocket of fluid in the RIGHT chest. The area was then prepped and draped in the normal sterile fashion. 1% Lidocaine was used for local anesthesia. Under ultrasound guidance a 8 French thoracentesis catheter was introduced. Thoracentesis was performed. The catheter was removed and a dressing applied. FINDINGS: A total of approximately 1.03 L of yellow RIGHT pleural fluid was removed. Samples were sent to the laboratory as requested by the clinical team. IMPRESSION: Successful ultrasound guided RIGHT thoracentesis yielding 1.03 L of pleural fluid. Electronically Signed   By: Lavonia Dana M.D.   On: 04/05/2020 13:06    Microbiology Recent Results (from the past 240 hour(s))  Resp Panel by RT-PCR (Flu A&B, Covid) Nasopharyngeal Swab     Status: None   Collection Time: 05/06/2020  9:31 PM   Specimen: Nasopharyngeal Swab; Nasopharyngeal(NP) swabs in vial transport medium  Result Value Ref Range Status   SARS Coronavirus 2 by RT PCR NEGATIVE NEGATIVE Final    Comment: (NOTE) SARS-CoV-2 target nucleic acids are NOT DETECTED.  The SARS-CoV-2 RNA is generally detectable in upper respiratory specimens during the acute phase of infection.  The lowest concentration of SARS-CoV-2 viral copies this assay can detect is 138 copies/mL. A negative result does not preclude SARS-Cov-2 infection and should not be used as the sole basis for treatment or other patient management decisions. A negative result may occur with  improper specimen collection/handling, submission of specimen other than nasopharyngeal swab, presence of viral mutation(s) within the areas targeted by this assay, and inadequate number of viral copies(<138 copies/mL). A negative result must be  combined with clinical observations, patient history, and epidemiological information. The expected result is Negative.  Fact Sheet for Patients:  EntrepreneurPulse.com.au  Fact Sheet for Healthcare Providers:  IncredibleEmployment.be  This test is no t yet approved or cleared by the [place] FDA and  has been authorized for detection and/or diagnosis of SARS-CoV-2 by FDA under an Emergency Use Authorization (EUA). This EUA will remain  in effect (meaning this test can be used) for the duration of the COVID-19 declaration under Section 564(b)(1) of the Act, 21 U.S.C.section 360bbb-3(b)(1), unless the authorization is terminated  or revoked sooner.       Influenza A by PCR NEGATIVE NEGATIVE Final   Influenza B by PCR NEGATIVE NEGATIVE Final    Comment: (NOTE) The Xpert Xpress SARS-CoV-2/FLU/RSV plus assay is intended as an aid in the diagnosis of influenza from Nasopharyngeal swab specimens and should not be used as a sole basis for treatment. Nasal washings and aspirates are unacceptable for Xpert Xpress SARS-CoV-2/FLU/RSV testing.  Fact Sheet for Patients: EntrepreneurPulse.com.au  Fact Sheet for Healthcare Providers: IncredibleEmployment.be  This test is not yet approved or cleared by the [place] FDA and has been authorized for detection and/or diagnosis of SARS-CoV-2 by FDA under an Emergency Use Authorization (EUA). This EUA will remain in effect (meaning this test can be used) for the duration of the COVID-19 declaration under Section 564(b)(1) of the Act, 21 U.S.C. section 360bbb-3(b)(1), unless the authorization is terminated or revoked.  Performed at [facility], [address], [place], [place] [address]   MRSA PCR Screening     Status: None   Collection Time: 05-12-20 12:00 AM   Specimen: Nasopharyngeal  Result Value Ref Range Status   MRSA by PCR NEGATIVE NEGATIVE Final     Comment:        The GeneXpert MRSA Assay (FDA approved for NASAL specimens only), is one component of a comprehensive MRSA colonization surveillance program. It is not intended to diagnose MRSA infection nor to guide or monitor treatment for MRSA infections. Performed at [facility], [address], [place], [place] [address]   Culture, blood (Routine X 2) w Reflex to ID Panel     Status: None (Preliminary result)   Collection Time: 12-May-2020 10:41 AM   Specimen: BLOOD  Result Value Ref Range Status   Specimen Description BLOOD BLOOD LEFT WRIST  Final   Special Requests   Final    Blood Culture results may not be optimal due to an inadequate volume of blood received in culture bottles BOTTLES DRAWN AEROBIC ONLY Performed at [facility], [address] [place],  [address]    Culture PENDING  Incomplete   Report Status PENDING  Incomplete  Culture, blood (Routine X 2) w Reflex to ID Panel     Status: None (Preliminary result)   Collection Time: 05-12-20 10:46 AM   Specimen: BLOOD  Result Value Ref Range Status   Specimen Description BLOOD BLOOD RIGHT HAND  Final   Special Requests   Final    Blood Culture  results may not be optimal due to an inadequate volume of blood received in culture bottles BOTTLES DRAWN AEROBIC AND ANAEROBIC Performed at [facility], [address], [place], [place] [address]    Culture PENDING  Incomplete   Report Status PENDING  Incomplete    Lab Basic Metabolic Panel: Recent Labs  Lab 04/19/2020 1903 04/23/2020 2057 04/30/2020 2343 2020/05/26 0534 05-26-20 0955  NA 133* 128* 132* 137 134*  K 6.9* 7.0* 7.6* 6.1* 6.8*  CL 97* 93* 98 99 100  CO2 11* 11* 8* 12* 9*  GLUCOSE <20* 285* 147* 88 151*  BUN 70* 68* 73* 73* 75*  CREATININE 3.43* 3.54* 3.58* 3.58* 3.92*  CALCIUM 9.4 8.4* 9.0 9.5 9.1  MG 2.9*  --   --   --   --    Liver Function Tests: Recent Labs  Lab 04/27/20 1112 04/27/2020 1903 May 26, 2020 0534 May 26, 2020 0955  AST 33 69* 685*  1,074*  ALT 32 <5 285* 418*  ALKPHOS 162* 186* 187* 179*  BILITOT 1.4* 2.7* 2.3* 2.2*  PROT 6.3* 6.9 6.6 6.2*  ALBUMIN 3.3* 3.6 3.4* 3.2*   No results for input(s): LIPASE, AMYLASE in the last 168 hours. No results for input(s): AMMONIA in the last 168 hours. CBC: Recent Labs  Lab 04/27/20 1112 04/14/2020 1903 26-May-2020 0534 May 26, 2020 0955  WBC 7.2 8.7 19.4* 21.0*  NEUTROABS 4.4 6.5  --   --   HGB 8.3* 9.2* 8.3* 8.5*  HCT 28.8* 34.3* 32.2* 33.1*  MCV 80.4 84.1 88.2 89.9  PLT 346 443* 403* 368   Cardiac Enzymes: No results for input(s): CKTOTAL, CKMB, CKMBINDEX, TROPONINI in the last 168 hours. Sepsis Labs: Recent Labs  Lab 04/27/20 1112 04/21/2020 1903 05-26-2020 0534 2020/05/26 0955 May 26, 2020 1012  PROCALCITON  --   --  0.35  --   --   WBC 7.2 8.7 19.4* 21.0*  --   LATICACIDVEN  --   --   --   --  >11.0*    Procedures/Operations    Kaity Pitstick May 26, 2020, 2:57 PM

## 2020-05-09 NOTE — Progress Notes (Signed)
Notifed by Dr Denton Brick that after family discussion patient is for comfort care. Cardiology with no longer follow on inpatient basis, defer management to primary team and palliative care.    Carlyle Dolly MD

## 2020-05-09 NOTE — Progress Notes (Incomplete)
Attending note  Patient seen and discussed with PA Ahmed Prima, I agree with her documentation. 87 yo female history of afib recently failed amiodarone, chronic diastolic HF, mitral regurgitation, RA admitted with generliazed weakness and leg swelling. Recent admit 04/02/20 with volume overload requiring diuresis, bilateral thoracentesis.    Admit labs  Na 133 K 6.9 BUN 70 Cr 3.43 ASST 69 Tbili 2.7 Gluc <20  WBC 8.7 Hgb 9.2 Plt 443 Mg 2.9 bicard 11 BNP 2512 COVID neg ABG 7.23/27.5/142/13 CXR trace bilatearl effusions EKG rate controlled afib, bifascicular block    Assessment/Plan  1. Acute on chronic combined systolic/diastolic HF - echo 05/05/04 LVEF 45-50%, indet dd, low normal RV, mod to severe MR - ongoing issues with fluid retention, diuresis has been limited by labile renal function - discharge weight 04/09/20 156 lbs, reported admit weight yesterday 155 lbs though today weight reported 167 lbs. BNP is markedly higher than prior values at 2512 - repeat echo  2. Afib  - complicated history, most recently had failed amidoarone therapy and was discontinued by EP with plans for rate control - she was to f/u with EP as there were some thoughts about potential consideration for av nodal ablation and pacemaker if ongoing issues with afib with high rates  3. AKI on CKD - labile Cr recently due to ongoing issues with fluid overload and diuresis - Cr 1.3-2.9 over last 2 months - admit Cr  4. Metabolic acidosis - with bicarb down to 8 by chemistry - lactic acid is pending - if not lactic acidosis, perhaps related to her AKI on CKD   5. Hyperkalemia - peaked at 7.6, down to 6.1 this morning - received calcium gluconate, lokelma 10g x 1 with repeat pending this AM.    6. Hypoglycemia - resolved, per primary team  7. Elevated liver enzymes - possibly from venous congestion. Could be some component of sepsis, procalcitonin is pending.  - f/u repeat echo - defer alternative etioogies to  primary team.   8. Leukocytosis -WBC 19.4.  some documented hypothermia, procalcitonin 0.35 - will discuss with primary team if would consider broad spectrum abx

## 2020-05-09 NOTE — Progress Notes (Signed)
At [facility] it was reported to this RN that the patient was breathing irregularly, and was minimally responsive. MD called and A rapid response called. Blood glucose was 64, 1 amp of d50 given. Patient given Sodium bicarb, d50, insulin and calcium chloride for hyperkalemia. She was taken to CT for Code Stroke, and MRI. Patient ABG was obtained as well. Severe Metabolic acidosis noted. Bi-Carb drip was initiated. MD and RN discussion with family was facilitated, and it was determined by the family that the patient did not want intubation and that we would proceed with comfort measures.   Patient was given 1mg  of IV ativan and 108mcg of IV fentanyl for Work of breathing and comfort. She was then taken off of the Bi-pap, after 15 min the patient expired. No heart beat auscultated for > 2 min by 2 RNs. Family supported through the grieving process, chaplain and family minister at bedside.   [place] [facility] notified.

## 2020-05-09 NOTE — Progress Notes (Signed)
CRITICAL VALUE STICKER  CRITICAL VALUE:Lactate/ Potassium/ PH Arterial stick   RECEIVER (on-site recipient of call): [place] NOTIFIED: 75   MESSENGER (representative from lab):  MD NOTIFIED: Dr. Denton Brick   TIME OF NOTIFICATION:1045  RESPONSE: orders placed

## 2020-05-09 DEATH — deceased

## 2020-05-11 ENCOUNTER — Ambulatory Visit: Payer: Medicare Other | Admitting: Internal Medicine

## 2020-05-12 NOTE — Telephone Encounter (Signed)
Opened in error

## 2020-05-13 ENCOUNTER — Ambulatory Visit: Payer: Medicare Other | Admitting: Rheumatology

## 2020-05-14 ENCOUNTER — Ambulatory Visit: Payer: Self-pay | Admitting: *Deleted

## 2020-06-28 ENCOUNTER — Encounter (INDEPENDENT_AMBULATORY_CARE_PROVIDER_SITE_OTHER): Payer: Medicare Other | Admitting: Ophthalmology

## 2020-07-08 ENCOUNTER — Ambulatory Visit: Payer: Medicare Other | Admitting: Rheumatology

## 2020-07-09 ENCOUNTER — Ambulatory Visit: Payer: Medicare Other | Admitting: Cardiology

## 2020-07-13 ENCOUNTER — Ambulatory Visit: Payer: Medicare Other | Admitting: Internal Medicine

## 2021-08-05 IMAGING — DX DG CHEST 1V PORT
1 series · 1 of 1 positions shown · non-contrast
Comparison: Chest x-ray 07/26/2015, CT chest 10/08/2012

CLINICAL DATA: Atrial fibrillation- new onset

EXAM:
PORTABLE CHEST 1 VIEW.  The patient is slightly rotated.

[chest ap]
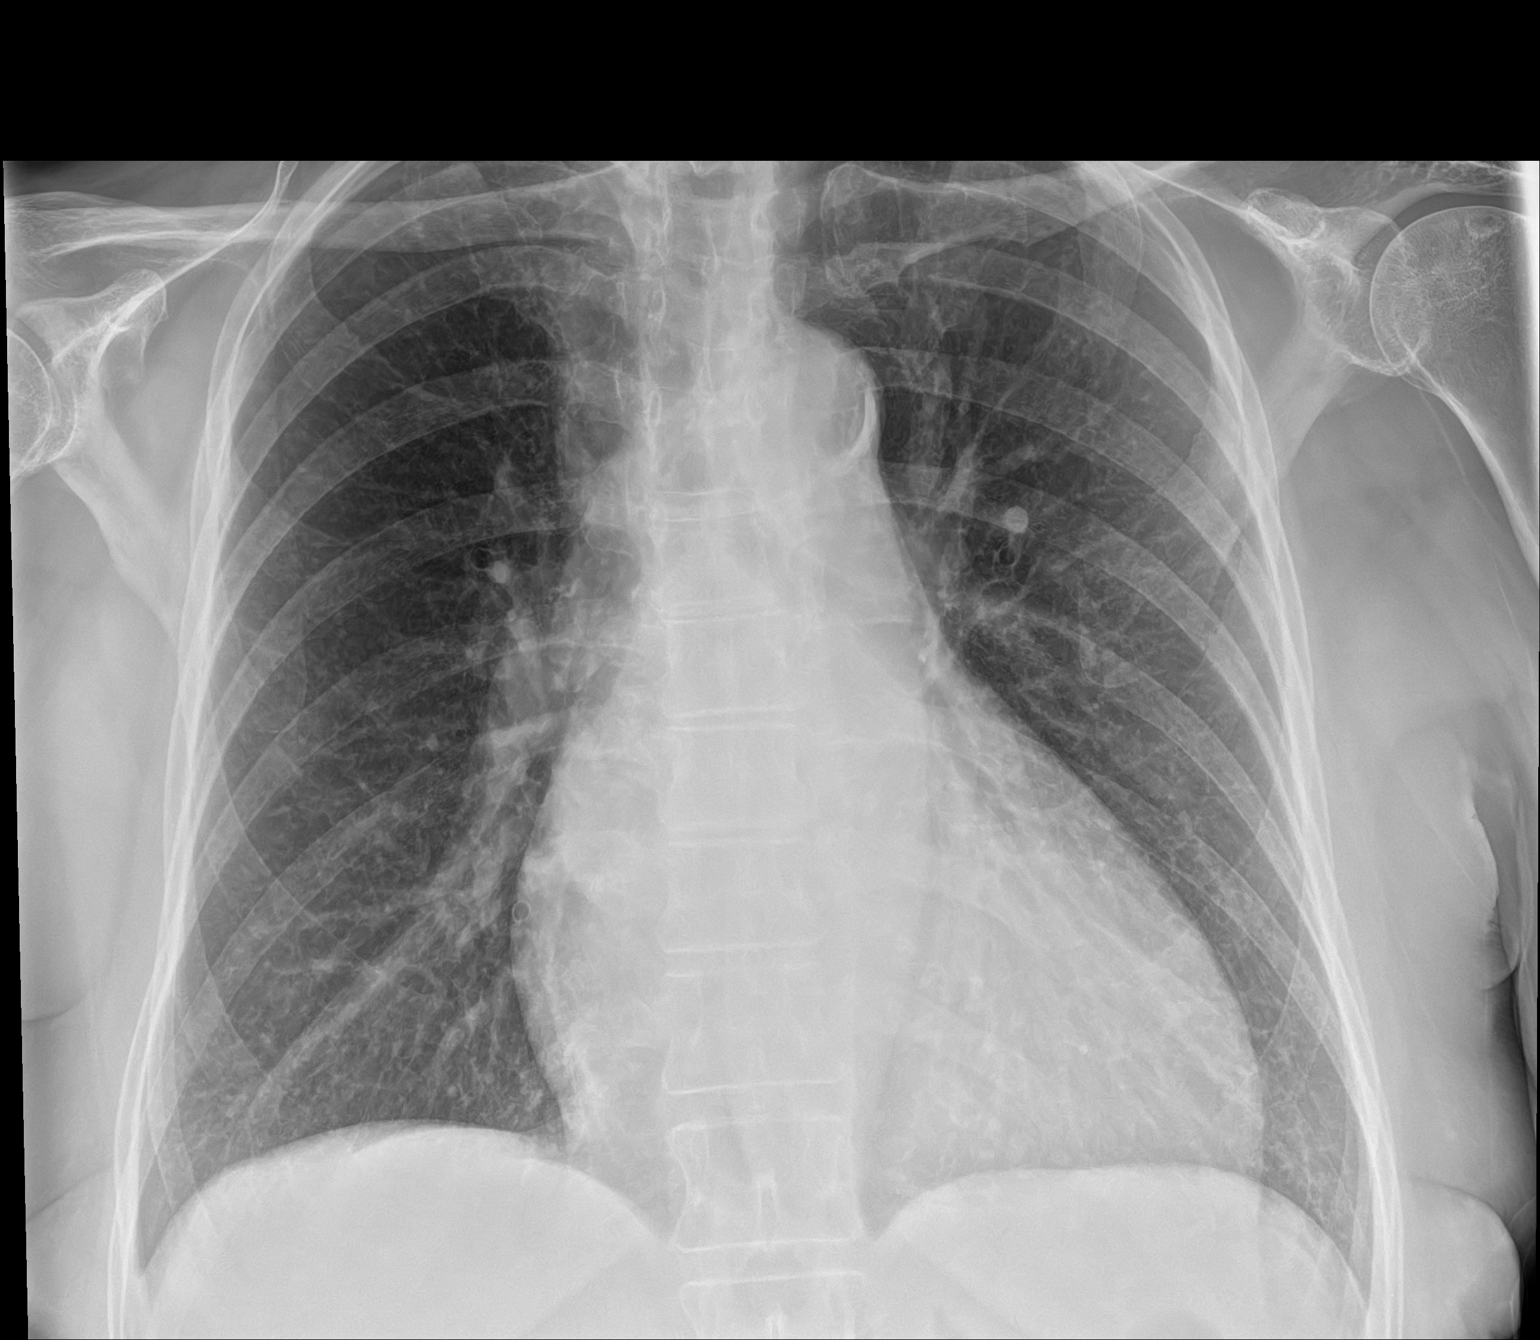

[1 of 1 positions shown; findings below may reference images not displayed]

FINDINGS: The heart size and mediastinal contours are unchanged. Aortic arch
calcification.

Trace biapical pleural/pulmonary scarring. No focal consolidation.
No pulmonary edema. No pleural effusion. No pneumothorax.

No acute osseous abnormality.
IMPRESSION: No active cardiopulmonary disease.

## 2022-02-14 IMAGING — US US THORACENTESIS ASP PLEURAL SPACE W/IMG GUIDE
1 series · 4 of 4 positions shown · non-contrast
Comparison: none

INDICATION: BILATERAL pleural effusions, shortness of breath, orthopnea

[Series 1: us thoracentesis asp pleural space w/img guide · 0.25mm/px · 4 of 4 slices shown]
[im 1/4]
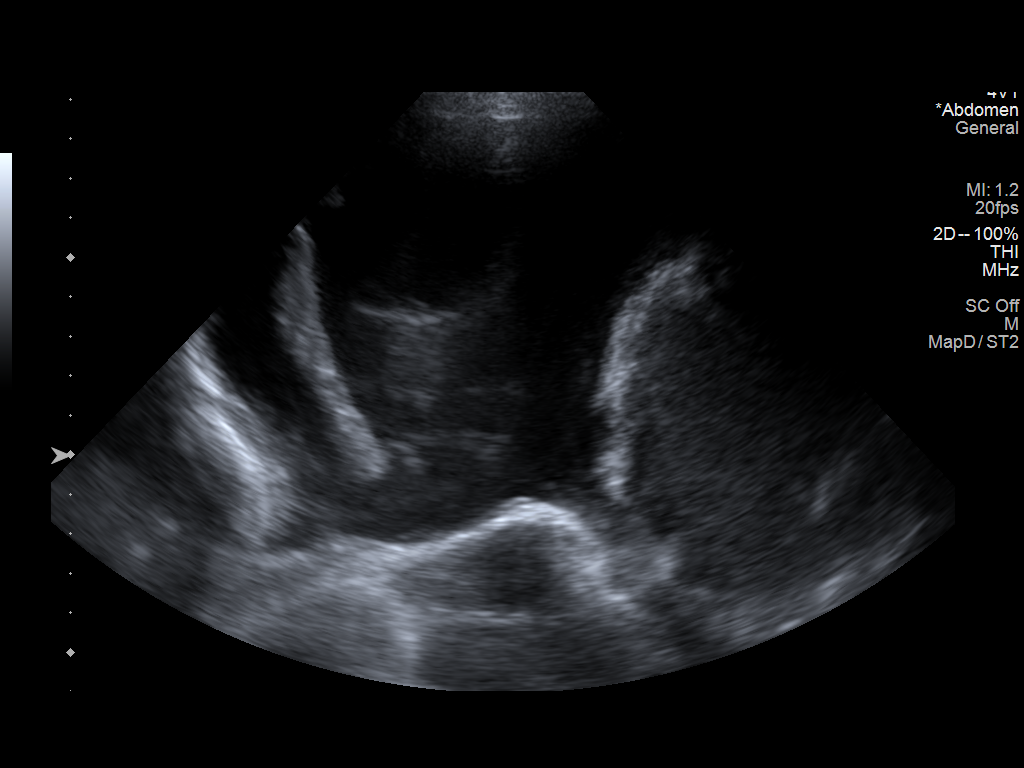
[im 2/4]
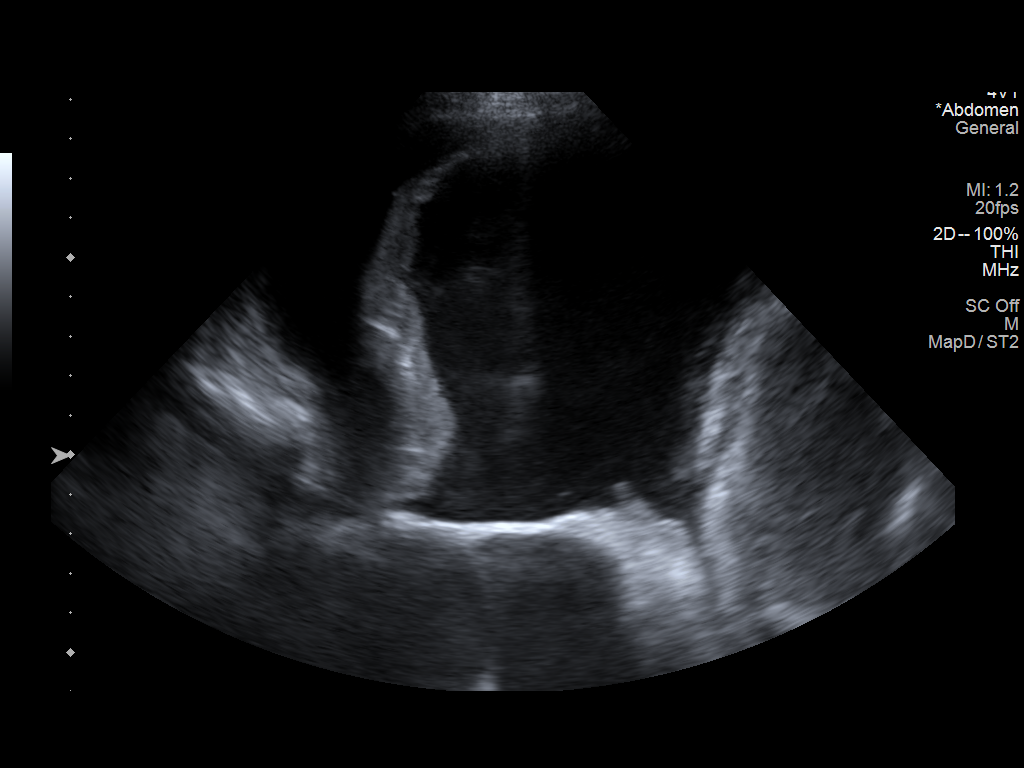
[im 3/4]
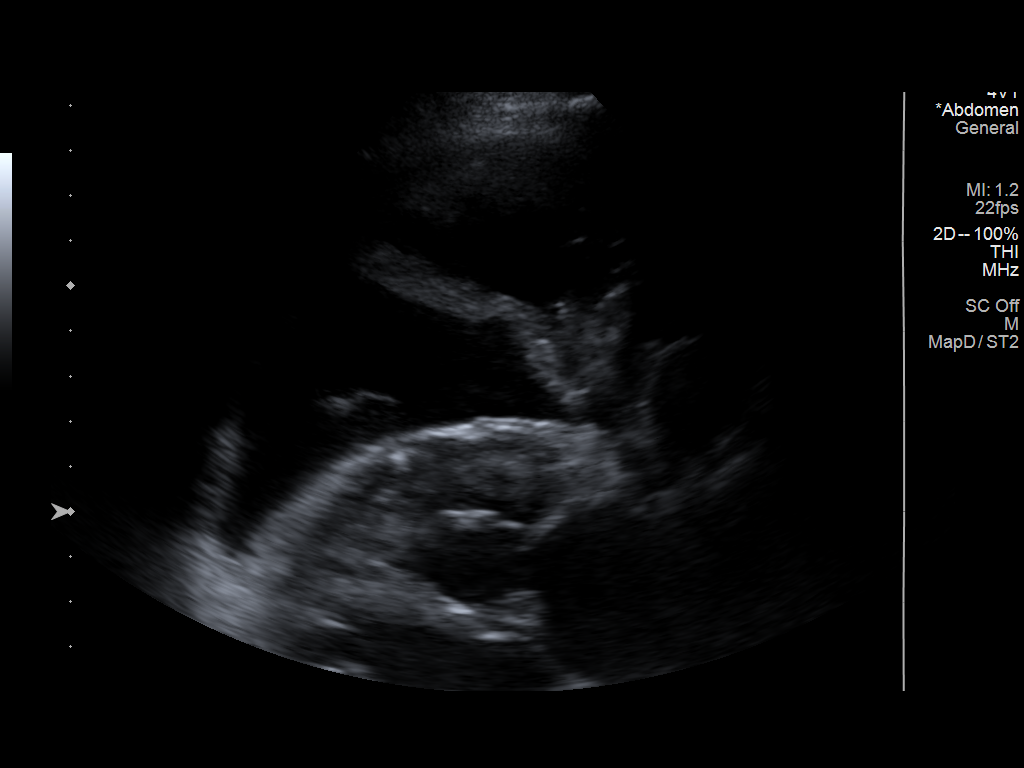
[im 4/4]
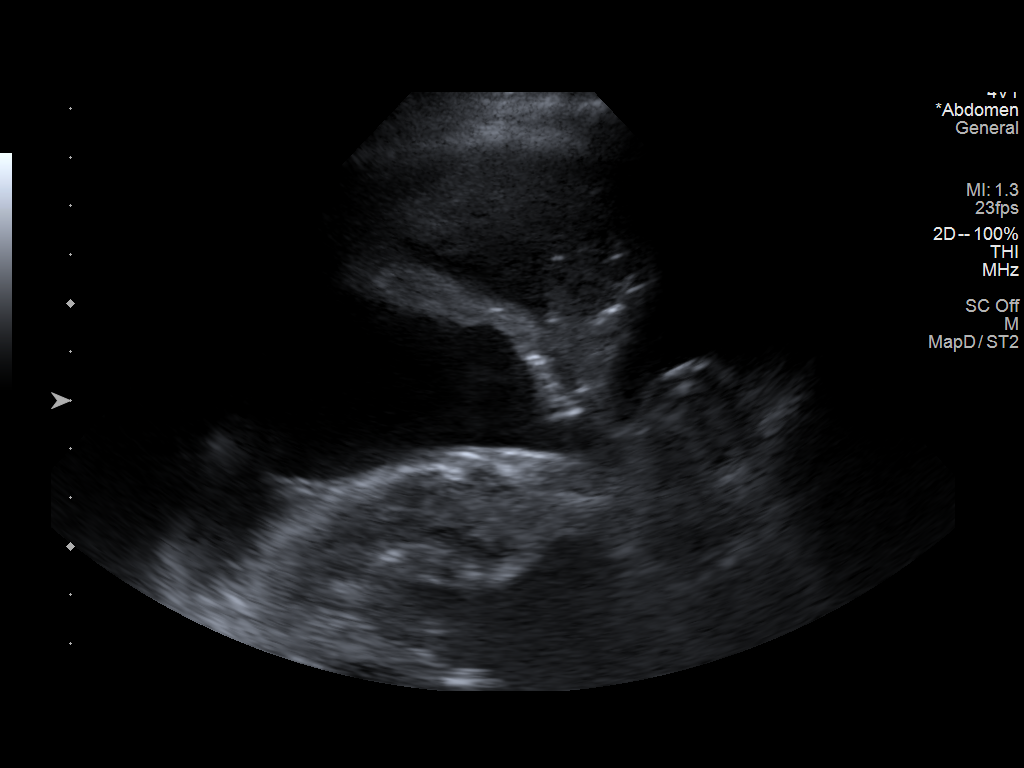

[4 of 4 positions shown; findings below may reference images not displayed]

EXAM:
ULTRASOUND GUIDED DIAGNOSTIC AND THERAPEUTIC RIGHT THORACENTESIS

MEDICATIONS:
None.

COMPLICATIONS:
None immediate.

PROCEDURE:
An ultrasound guided thoracentesis was thoroughly discussed with the
patient and questions answered. The benefits, risks, alternatives
and complications were also discussed. The patient understands and
wishes to proceed with the procedure. Written consent was obtained.

Ultrasound was performed to localize and mark an adequate pocket of
fluid in the RIGHT chest. The area was then prepped and draped in
the normal sterile fashion. 1% Lidocaine was used for local
anesthesia. Under ultrasound guidance a 8 French thoracentesis
catheter was introduced. Thoracentesis was performed. The catheter
was removed and a dressing applied.
FINDINGS: A total of approximately 1.03 L of yellow RIGHT pleural fluid was
removed.

Samples were sent to the laboratory as requested by the clinical
team.
IMPRESSION: Successful ultrasound guided RIGHT thoracentesis yielding 1.03 L of
pleural fluid.

## 2022-02-14 IMAGING — DX DG CHEST 2V
2 series · 2 of 2 positions shown · non-contrast
Comparison: 04/03/2020

CLINICAL DATA: BILATERAL pleural effusions, post RIGHT
thoracentesis

EXAM:
CHEST - 2 VIEW

[chest pa]
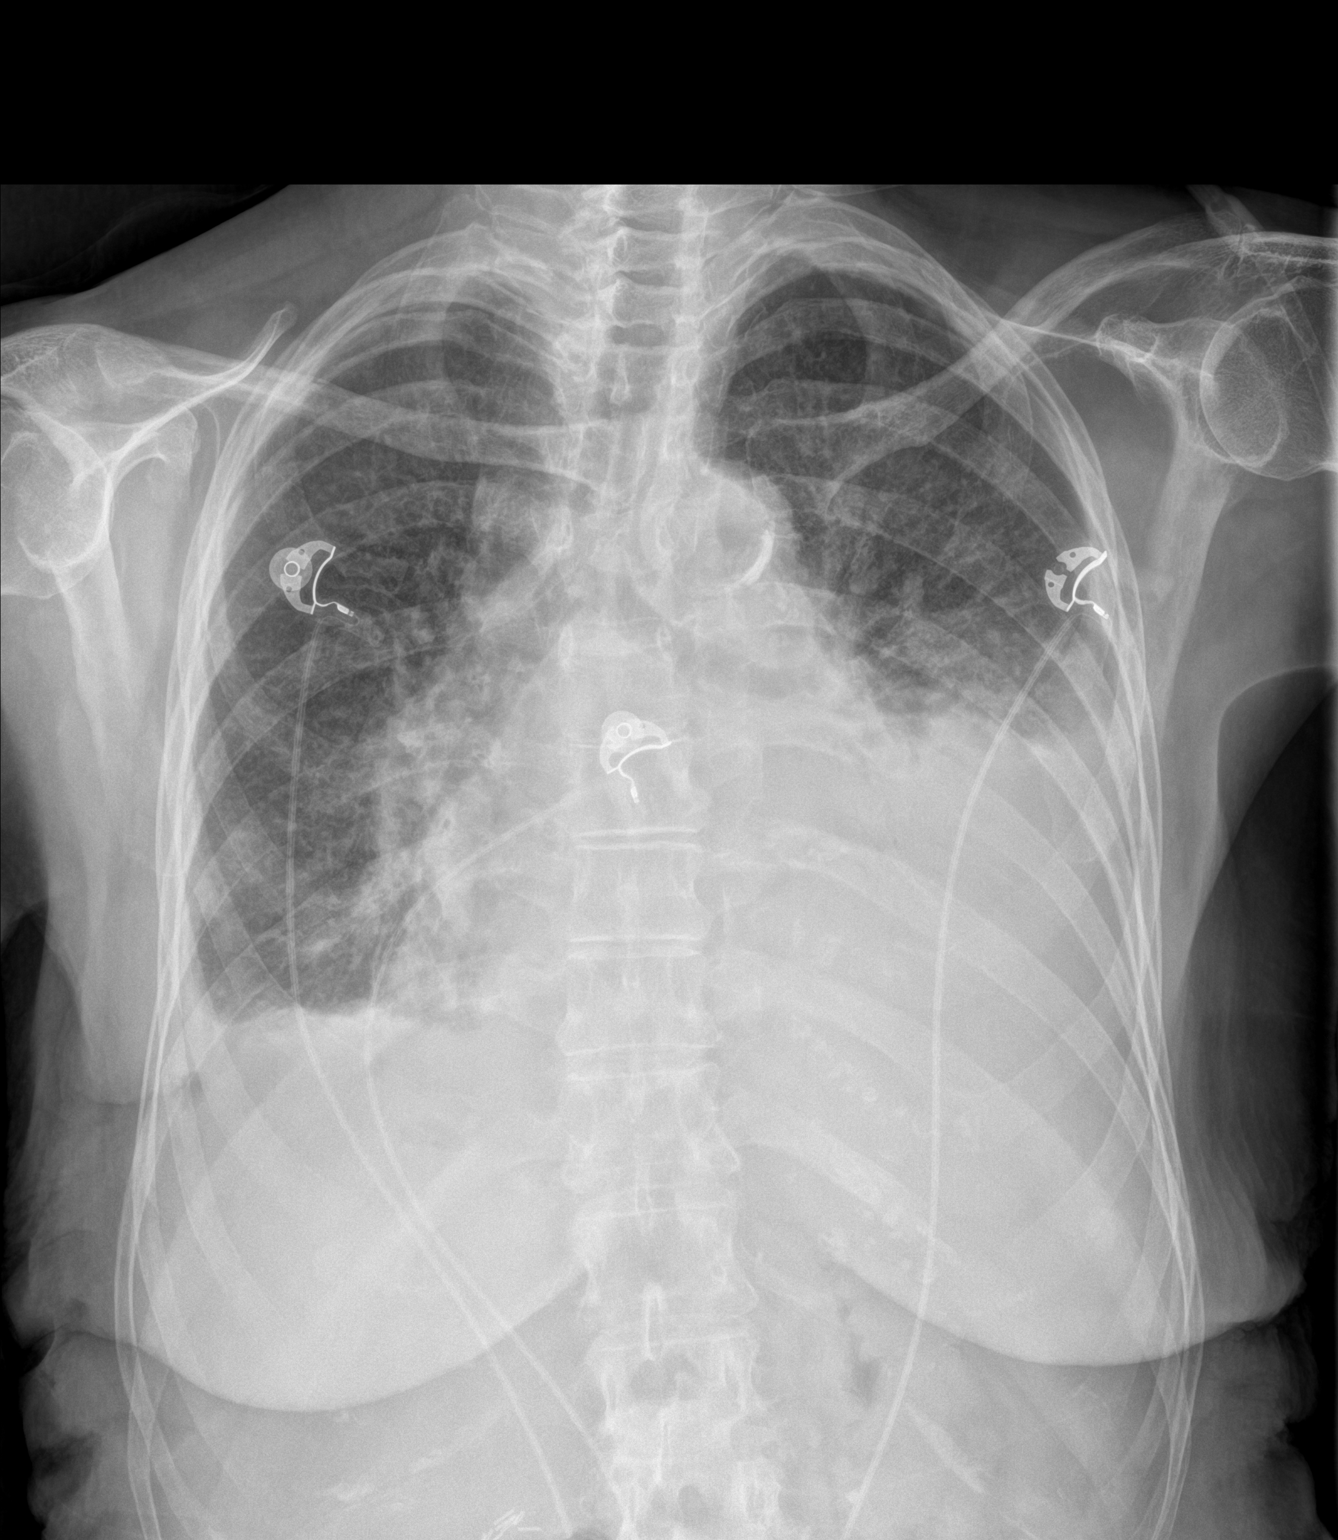

[chest lat]
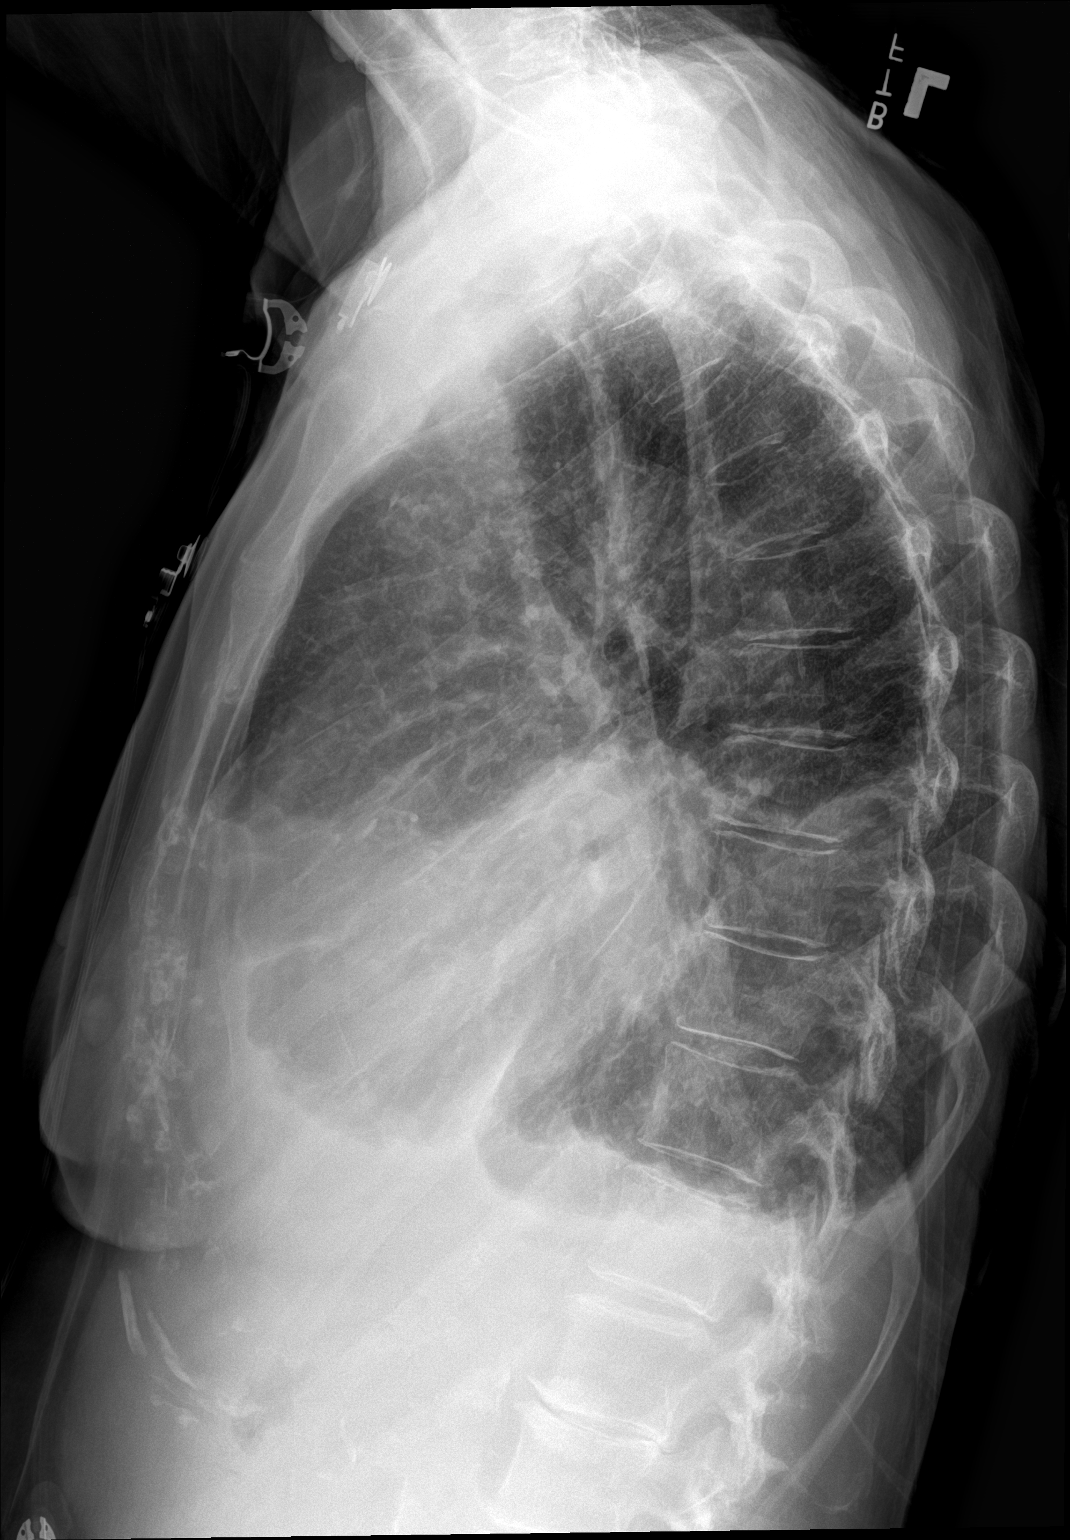

[2 of 2 positions shown; findings below may reference images not displayed]

FINDINGS: PA view performed with expiratory technique due to preceding
thoracentesis.

Enlargement of cardiac silhouette with pulmonary vascular
congestion.

Atherosclerotic calcification aorta.

Perihilar infiltrates consistent with pulmonary edema.

Minimal residual RIGHT pleural effusion and basilar atelectasis post
thoracentesis.

No pneumothorax.

Moderate LEFT pleural effusion, slightly increased from previous
exam.

No acute osseous findings.
IMPRESSION: Enlargement of cardiac silhouette with pulmonary vascular congestion
and mild pulmonary edema.

BILATERAL pleural effusions and basilar atelectasis, slightly
increased on LEFT as well as decreased on RIGHT post RIGHT
thoracentesis.

No pneumothorax following RIGHT thoracentesis.

## 2022-02-15 IMAGING — US US THORACENTESIS ASP PLEURAL SPACE W/IMG GUIDE
1 series · 2 of 2 positions shown · non-contrast
Comparison: none

INDICATION: Diffuse LEFT pleural effusion

[Series 1: us thoracentesis asp pleural space w/img guide · 2 of 2 slices shown]
[im 1/2]
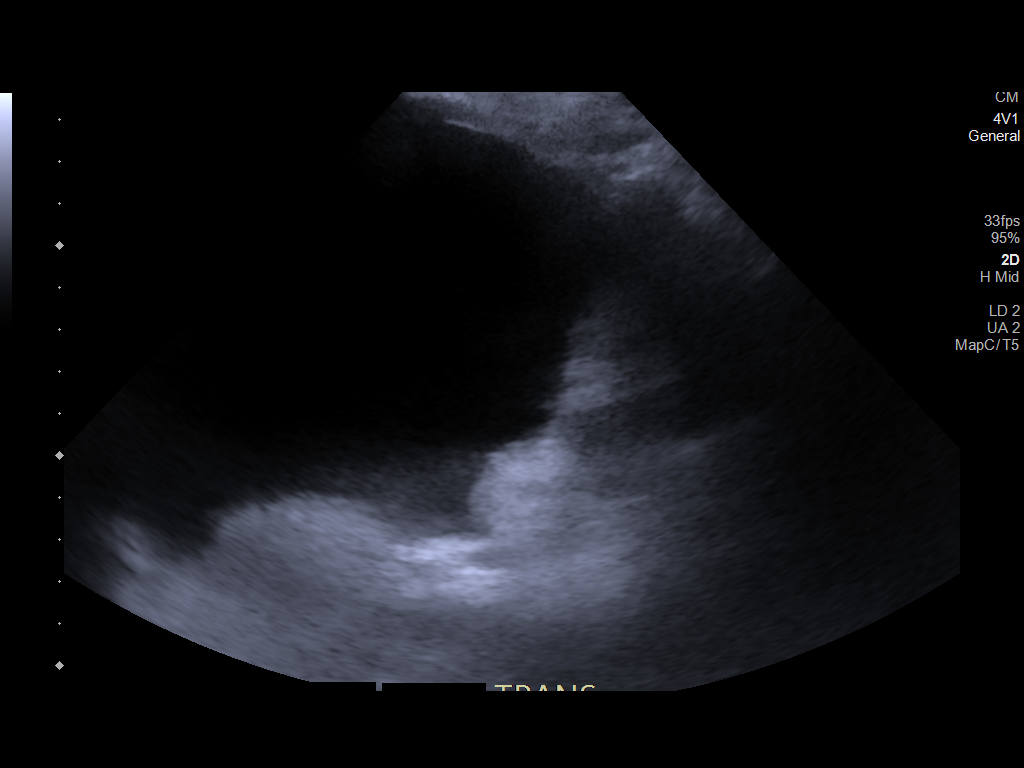
[im 2/2]
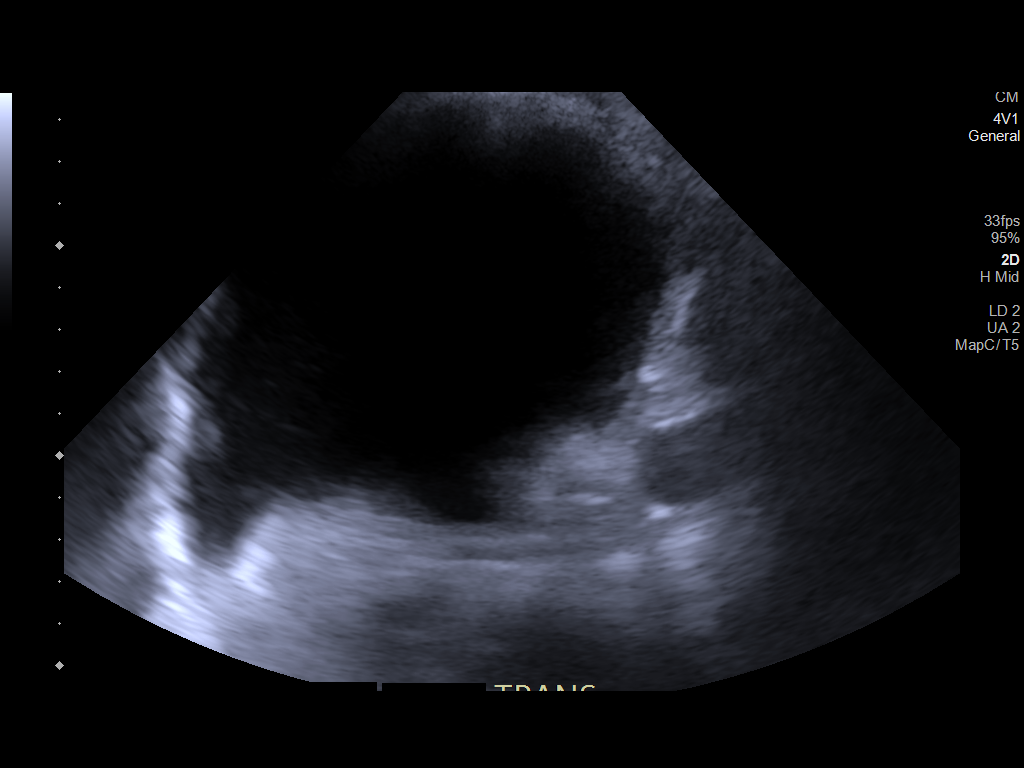

[2 of 2 positions shown; findings below may reference images not displayed]

EXAM:
ULTRASOUND GUIDED DIAGNOSTIC AND THERAPEUTIC LEFT THORACENTESIS

MEDICATIONS:
None.

COMPLICATIONS:
None immediate

PROCEDURE:
An ultrasound guided thoracentesis was thoroughly discussed with the
patient and questions answered. The benefits, risks, alternatives
and complications were also discussed. The patient understands and
wishes to proceed with the procedure. Written consent was obtained.

Ultrasound was performed to localize and mark an adequate pocket of
fluid in the LEFT chest. The area was then prepped and draped in the
normal sterile fashion. 1% Lidocaine was used for local anesthesia.
Under ultrasound guidance a 8 French thoracentesis catheter was
introduced. Thoracentesis was performed. The catheter was removed
and a dressing applied. Procedure tolerated well by patient without
immediate complication.
FINDINGS: A total of approximately 900 mL of clear yellow LEFT pleural fluid
was removed.

Samples were sent to the laboratory as requested by the clinical
team.
IMPRESSION: Successful ultrasound guided LEFT thoracentesis yielding 900 mL of
pleural fluid.
